# Patient Record
Sex: Female | Born: 1965
Health system: Southern US, Community
[De-identification: ages and names within clinical notes are randomized; demographics above are authoritative.]

## PROBLEM LIST (undated history)

## (undated) ENCOUNTER — Ambulatory Visit: Admission: EM | Payer: PRIVATE HEALTH INSURANCE

## (undated) DIAGNOSIS — J45909 Unspecified asthma, uncomplicated: Secondary | ICD-10-CM

## (undated) DIAGNOSIS — I428 Other cardiomyopathies: Secondary | ICD-10-CM

## (undated) DIAGNOSIS — I5020 Unspecified systolic (congestive) heart failure: Secondary | ICD-10-CM

## (undated) DIAGNOSIS — H35039 Hypertensive retinopathy, unspecified eye: Secondary | ICD-10-CM

## (undated) DIAGNOSIS — I10 Essential (primary) hypertension: Secondary | ICD-10-CM

## (undated) DIAGNOSIS — E119 Type 2 diabetes mellitus without complications: Secondary | ICD-10-CM

## (undated) DIAGNOSIS — I639 Cerebral infarction, unspecified: Secondary | ICD-10-CM

## (undated) DIAGNOSIS — M79605 Pain in left leg: Secondary | ICD-10-CM

## (undated) DIAGNOSIS — H269 Unspecified cataract: Secondary | ICD-10-CM

## (undated) HISTORY — DX: Hypertensive retinopathy, unspecified eye: H35.039

## (undated) HISTORY — PX: CERVIX LESION DESTRUCTION: SHX591

## (undated) HISTORY — PX: DILATION AND CURETTAGE OF UTERUS: SHX78

## (undated) HISTORY — DX: Unspecified cataract: H26.9

---

## 1994-02-12 HISTORY — PX: TUBAL LIGATION: SHX77

## 2000-03-15 ENCOUNTER — Encounter: Admission: RE | Admit: 2000-03-15 | Discharge: 2000-03-15 | Payer: Self-pay

## 2000-08-22 ENCOUNTER — Other Ambulatory Visit: Admission: RE | Admit: 2000-08-22 | Discharge: 2000-08-22 | Payer: Self-pay | Admitting: Obstetrics and Gynecology

## 2000-11-19 ENCOUNTER — Ambulatory Visit (HOSPITAL_COMMUNITY): Admission: RE | Admit: 2000-11-19 | Discharge: 2000-11-19 | Payer: Self-pay | Admitting: Obstetrics and Gynecology

## 2000-11-19 ENCOUNTER — Encounter (INDEPENDENT_AMBULATORY_CARE_PROVIDER_SITE_OTHER): Payer: Self-pay | Admitting: Specialist

## 2001-03-27 ENCOUNTER — Emergency Department (HOSPITAL_COMMUNITY): Admission: EM | Admit: 2001-03-27 | Discharge: 2001-03-27 | Payer: Self-pay | Admitting: Emergency Medicine

## 2006-05-31 ENCOUNTER — Inpatient Hospital Stay (HOSPITAL_COMMUNITY): Admission: AD | Admit: 2006-05-31 | Discharge: 2006-05-31 | Payer: Self-pay | Admitting: Family Medicine

## 2007-07-02 ENCOUNTER — Inpatient Hospital Stay (HOSPITAL_COMMUNITY): Admission: AD | Admit: 2007-07-02 | Discharge: 2007-07-02 | Payer: Self-pay | Admitting: Obstetrics & Gynecology

## 2008-12-03 ENCOUNTER — Encounter: Admission: RE | Admit: 2008-12-03 | Discharge: 2008-12-03 | Payer: Self-pay | Admitting: Family Medicine

## 2009-03-07 ENCOUNTER — Ambulatory Visit (HOSPITAL_COMMUNITY): Admission: RE | Admit: 2009-03-07 | Discharge: 2009-03-07 | Payer: Self-pay | Admitting: Orthopedic Surgery

## 2009-04-27 ENCOUNTER — Encounter: Admission: RE | Admit: 2009-04-27 | Discharge: 2009-04-27 | Payer: Self-pay | Admitting: Family Medicine

## 2009-04-29 ENCOUNTER — Encounter: Admission: RE | Admit: 2009-04-29 | Discharge: 2009-04-29 | Payer: Self-pay | Admitting: Family Medicine

## 2009-10-25 ENCOUNTER — Ambulatory Visit (HOSPITAL_COMMUNITY): Admission: RE | Admit: 2009-10-25 | Discharge: 2009-10-25 | Payer: Self-pay | Admitting: Obstetrics and Gynecology

## 2010-03-05 ENCOUNTER — Encounter: Payer: Self-pay | Admitting: Family Medicine

## 2010-03-06 ENCOUNTER — Encounter: Payer: Self-pay | Admitting: Family Medicine

## 2010-04-27 LAB — BASIC METABOLIC PANEL
BUN: 11 mg/dL (ref 6–23)
Calcium: 8.9 mg/dL (ref 8.4–10.5)
GFR calc non Af Amer: >60 mL/min (ref >60)
Glucose, Bld: 230 mg/dL — ABNORMAL HIGH (ref 70–99)

## 2010-04-27 LAB — CBC
HCT: 36.8 % (ref 36.0–46.0)
MCH: 28.3 pg (ref 26.0–34.0)
MCHC: 33.5 g/dL (ref 30.0–36.0)
MCV: 84.4 fL (ref 78.0–100.0)
RDW: 15.1 % (ref 11.5–15.5)

## 2010-06-30 NOTE — Op Note (Signed)
[facility] [place]  Patient:    [name], [name] Visit Number: [id number] MRN: [id number]          Service Type: DSU Location: [facility] Attending Physician:  Maxie Better Proc. Date: 11/19/00 Admit Date:  11/19/2000                             Operative Report  PREOPERATIVE DIAGNOSIS:       Menorrhagia and uterine fibroids.  POSTOPERATIVE DIAGNOSIS:      Menorrhagia and uterine fibroids and endometrial polyps, pending final pathology.  OPERATION:                    Diagnostic hysteroscopy, resection of possible endometrial polyps, and dilatation and curettage.  SURGEON:                      Sheronette A. Cherly Hensen, M.D.  ASSISTANT:  ANESTHESIA:                   General anesthesia.  ESTIMATED BLOOD LOSS:  DESCRIPTION OF PROCEDURE:     Under adequate general anesthesia the patient was placed in the dorsal lithotomy position.  She was prepped and draped in the usual sterile fashion.  The bladder was catheterized for a small amount of urine.  Examination under anesthesia revealed an anteverted uterus, top normal size, no adnexal masses could be appreciated.  Bivalve speculum was placed in the vagina.  Single tooth tenaculum was placed on the anterior lip of the cervix.  The cervix was parous.  The cervix was serially dilated up to #31 [facility] dilator and a resectoscope was introduced into the uterine cavity without incident.  Both tubal ostia could be seen.  The endocervical canal was without any lesions.  The endometrial cavity was notable for in the cornual region on the right and left was a polypoid thickening, questionable endometrial polyps.  These were resected.  The cavity was then curetted. Reinsertion of the resectoscope with reinspection of the endometrial cavity was then performed.  No evidence of a submucosal fibroid could be seen.  With the curettage, there was slight irregularity of the cavity, but could not be visualized to concur with  a possible submucosal fibroid.  The cavity was once again curetted when the circumferential area of the endometrial cavity was on the same level, the decision was then made to terminate the procedure.  All instruments were then removed from the vagina.  Specimen labled endometrial curetting with possible endometrial polyp was sent to pathology.  Estimated blood loss was minimal.  Complications were none.  The patient tolerated the procedure well and was transferred to the recovery room in stable condition. Attending Physician:  Maxie Better DD:  11/19/00 TD:  11/20/00 Job: [id number]

## 2010-07-06 ENCOUNTER — Other Ambulatory Visit: Payer: Self-pay | Admitting: Family Medicine

## 2010-07-06 ENCOUNTER — Ambulatory Visit
Admission: RE | Admit: 2010-07-06 | Discharge: 2010-07-06 | Disposition: A | Discharge status: Home / Self Care | Payer: Medicaid Other | Source: Ambulatory Visit | Attending: Family Medicine | Admitting: Family Medicine

## 2010-07-06 DIAGNOSIS — R52 Pain, unspecified: Secondary | ICD-10-CM

## 2010-08-17 ENCOUNTER — Other Ambulatory Visit: Payer: Self-pay | Admitting: Podiatry

## 2010-08-17 DIAGNOSIS — M79672 Pain in left foot: Secondary | ICD-10-CM

## 2010-08-19 ENCOUNTER — Ambulatory Visit
Admission: RE | Admit: 2010-08-19 | Discharge: 2010-08-19 | Disposition: A | Discharge status: Home / Self Care | Payer: Medicaid Other | Source: Ambulatory Visit | Attending: Podiatry | Admitting: Podiatry

## 2010-08-19 DIAGNOSIS — M79672 Pain in left foot: Secondary | ICD-10-CM

## 2010-10-05 ENCOUNTER — Ambulatory Visit: Payer: Medicaid Other | Attending: Podiatry | Admitting: Rehabilitation

## 2010-10-05 DIAGNOSIS — IMO0001 Reserved for inherently not codable concepts without codable children: Secondary | ICD-10-CM | POA: Insufficient documentation

## 2010-10-05 DIAGNOSIS — R269 Unspecified abnormalities of gait and mobility: Secondary | ICD-10-CM | POA: Insufficient documentation

## 2010-10-05 DIAGNOSIS — M25673 Stiffness of unspecified ankle, not elsewhere classified: Secondary | ICD-10-CM | POA: Insufficient documentation

## 2010-10-05 DIAGNOSIS — M25676 Stiffness of unspecified foot, not elsewhere classified: Secondary | ICD-10-CM | POA: Insufficient documentation

## 2010-10-05 DIAGNOSIS — M25579 Pain in unspecified ankle and joints of unspecified foot: Secondary | ICD-10-CM | POA: Insufficient documentation

## 2010-10-12 ENCOUNTER — Ambulatory Visit: Payer: Medicaid Other | Admitting: Physical Therapy

## 2010-10-19 ENCOUNTER — Ambulatory Visit: Payer: Medicaid Other | Attending: Podiatry | Admitting: Physical Therapy

## 2010-10-19 DIAGNOSIS — R269 Unspecified abnormalities of gait and mobility: Secondary | ICD-10-CM | POA: Insufficient documentation

## 2010-10-19 DIAGNOSIS — M25676 Stiffness of unspecified foot, not elsewhere classified: Secondary | ICD-10-CM | POA: Insufficient documentation

## 2010-10-19 DIAGNOSIS — M25673 Stiffness of unspecified ankle, not elsewhere classified: Secondary | ICD-10-CM | POA: Insufficient documentation

## 2010-10-19 DIAGNOSIS — M25579 Pain in unspecified ankle and joints of unspecified foot: Secondary | ICD-10-CM | POA: Insufficient documentation

## 2010-10-19 DIAGNOSIS — IMO0001 Reserved for inherently not codable concepts without codable children: Secondary | ICD-10-CM | POA: Insufficient documentation

## 2010-10-23 ENCOUNTER — Ambulatory Visit: Payer: Medicaid Other | Admitting: Physical Therapy

## 2010-11-08 LAB — GC/CHLAMYDIA PROBE AMP, GENITAL: Chlamydia, DNA Probe: NEGATIVE

## 2010-11-08 LAB — POCT PREGNANCY, URINE
Operator id: 280921
Preg Test, Ur: NEGATIVE

## 2010-11-08 LAB — CBC
Hemoglobin: 13
Platelets: 450 — ABNORMAL HIGH
RDW: 15.9 — ABNORMAL HIGH

## 2010-11-08 LAB — URINALYSIS, ROUTINE W REFLEX MICROSCOPIC
Bilirubin Urine: NEGATIVE
Protein, ur: NEGATIVE
Specific Gravity, Urine: >1.03 — ABNORMAL HIGH
Urobilinogen, UA: 0.2

## 2010-11-08 LAB — WET PREP, GENITAL
Clue Cells Wet Prep HPF POC: NONE SEEN
Trich, Wet Prep: NONE SEEN
Yeast Wet Prep HPF POC: NONE SEEN

## 2010-11-08 LAB — URINE MICROSCOPIC-ADD ON

## 2011-05-18 ENCOUNTER — Emergency Department (HOSPITAL_COMMUNITY): Payer: Medicaid Other

## 2011-05-18 ENCOUNTER — Other Ambulatory Visit: Payer: Self-pay

## 2011-05-18 ENCOUNTER — Inpatient Hospital Stay (HOSPITAL_COMMUNITY)
Admission: EM | Admit: 2011-05-18 | Discharge: 2011-05-22 | DRG: 287 | Disposition: A | Discharge status: Home / Self Care | Payer: Medicaid Other | Attending: Internal Medicine | Admitting: Internal Medicine

## 2011-05-18 ENCOUNTER — Encounter (HOSPITAL_COMMUNITY): Payer: Self-pay | Admitting: Family Medicine

## 2011-05-18 DIAGNOSIS — Z91199 Patient's noncompliance with other medical treatment and regimen due to unspecified reason: Secondary | ICD-10-CM

## 2011-05-18 DIAGNOSIS — I10 Essential (primary) hypertension: Secondary | ICD-10-CM | POA: Diagnosis present

## 2011-05-18 DIAGNOSIS — I428 Other cardiomyopathies: Secondary | ICD-10-CM | POA: Diagnosis present

## 2011-05-18 DIAGNOSIS — Z9119 Patient's noncompliance with other medical treatment and regimen: Secondary | ICD-10-CM

## 2011-05-18 DIAGNOSIS — I509 Heart failure, unspecified: Secondary | ICD-10-CM | POA: Diagnosis present

## 2011-05-18 DIAGNOSIS — R079 Chest pain, unspecified: Secondary | ICD-10-CM

## 2011-05-18 DIAGNOSIS — R0902 Hypoxemia: Secondary | ICD-10-CM

## 2011-05-18 DIAGNOSIS — J811 Chronic pulmonary edema: Secondary | ICD-10-CM

## 2011-05-18 DIAGNOSIS — R000 Tachycardia, unspecified: Secondary | ICD-10-CM

## 2011-05-18 DIAGNOSIS — E119 Type 2 diabetes mellitus without complications: Secondary | ICD-10-CM | POA: Diagnosis present

## 2011-05-18 DIAGNOSIS — R0602 Shortness of breath: Secondary | ICD-10-CM

## 2011-05-18 DIAGNOSIS — I5021 Acute systolic (congestive) heart failure: Principal | ICD-10-CM | POA: Diagnosis present

## 2011-05-18 HISTORY — DX: Other cardiomyopathies: I42.8

## 2011-05-18 HISTORY — DX: Type 2 diabetes mellitus without complications: E11.9

## 2011-05-18 HISTORY — DX: Essential (primary) hypertension: I10

## 2011-05-18 HISTORY — DX: Unspecified systolic (congestive) heart failure: I50.20

## 2011-05-18 LAB — DIFFERENTIAL
Basophils Absolute: 0 10*3/uL (ref 0.0–0.1)
Basophils Relative: 1 % (ref 0–1)
Eosinophils Absolute: 0.2 10*3/uL (ref 0.0–0.7)
Eosinophils Relative: 2 % (ref 0–5)
Monocytes Absolute: 0.2 10*3/uL (ref 0.1–1.0)

## 2011-05-18 LAB — CBC
HCT: 40.6 % (ref 36.0–46.0)
MCH: 28.1 pg (ref 26.0–34.0)
MCHC: 32 g/dL (ref 30.0–36.0)
MCV: 87.7 fL (ref 78.0–100.0)
RDW: 14.1 % (ref 11.5–15.5)

## 2011-05-18 LAB — BASIC METABOLIC PANEL
BUN: 14 mg/dL (ref 6–23)
Calcium: 9.2 mg/dL (ref 8.4–10.5)
Creatinine, Ser: 0.77 mg/dL (ref 0.50–1.10)
GFR calc Af Amer: >90 mL/min (ref >90)

## 2011-05-18 LAB — D-DIMER, QUANTITATIVE: D-Dimer, Quant: 1.98 ug/mL-FEU — ABNORMAL HIGH (ref 0.00–0.48)

## 2011-05-18 MED ORDER — MORPHINE SULFATE 4 MG/ML IJ SOLN
4.0000 mg | Freq: Once | INTRAMUSCULAR | Status: AC
  Administered 2011-05-18: 4 mg via INTRAVENOUS
  Filled 2011-05-18: qty 1

## 2011-05-18 MED ORDER — IOHEXOL 300 MG/ML  SOLN
100.0000 mL | Freq: Once | INTRAMUSCULAR | Status: AC | PRN
  Administered 2011-05-18: 80 mL via INTRAVENOUS

## 2011-05-18 MED ORDER — SODIUM CHLORIDE 0.9 % IV SOLN: 250.0000 mL | INTRAVENOUS | Status: DC | PRN

## 2011-05-18 MED ORDER — ALBUTEROL SULFATE (5 MG/ML) 0.5% IN NEBU
5.0000 mg | INHALATION_SOLUTION | RESPIRATORY_TRACT | Status: AC
  Administered 2011-05-18: 5 mg via RESPIRATORY_TRACT
  Filled 2011-05-18: qty 1

## 2011-05-18 MED ORDER — ONDANSETRON HCL 4 MG/2ML IJ SOLN: 4.0000 mg | Freq: Four times a day (QID) | INTRAMUSCULAR | Status: DC | PRN

## 2011-05-18 MED ORDER — HEPARIN SODIUM (PORCINE) 5000 UNIT/ML IJ SOLN
5000.0000 [IU] | Freq: Three times a day (TID) | INTRAMUSCULAR | Status: DC
  Administered 2011-05-19 – 2011-05-21 (×8): 5000 [IU] via SUBCUTANEOUS
  Filled 2011-05-18 (×11): qty 1

## 2011-05-18 MED ORDER — FUROSEMIDE 10 MG/ML IJ SOLN
40.0000 mg | Freq: Two times a day (BID) | INTRAMUSCULAR | Status: DC
  Administered 2011-05-19 – 2011-05-20 (×4): 40 mg via INTRAVENOUS
  Filled 2011-05-18 (×6): qty 4

## 2011-05-18 MED ORDER — ASPIRIN EC 325 MG PO TBEC
325.0000 mg | DELAYED_RELEASE_TABLET | Freq: Every day | ORAL | Status: DC
  Administered 2011-05-19 – 2011-05-22 (×4): 325 mg via ORAL
  Filled 2011-05-18 (×5): qty 1

## 2011-05-18 MED ORDER — ASPIRIN 325 MG PO TABS
325.0000 mg | ORAL_TABLET | Freq: Once | ORAL | Status: AC
  Administered 2011-05-18: 325 mg via ORAL
  Filled 2011-05-18: qty 1

## 2011-05-18 MED ORDER — FUROSEMIDE 10 MG/ML IJ SOLN
20.0000 mg | Freq: Once | INTRAMUSCULAR | Status: AC
  Administered 2011-05-18: 20 mg via INTRAVENOUS
  Filled 2011-05-18 (×2): qty 2

## 2011-05-18 MED ORDER — SODIUM CHLORIDE 0.9 % IJ SOLN
3.0000 mL | INTRAMUSCULAR | Status: DC | PRN
  Administered 2011-05-20: 3 mL via INTRAVENOUS

## 2011-05-18 MED ORDER — SODIUM CHLORIDE 0.9 % IJ SOLN
3.0000 mL | Freq: Two times a day (BID) | INTRAMUSCULAR | Status: DC
  Administered 2011-05-19 – 2011-05-22 (×5): 3 mL via INTRAVENOUS

## 2011-05-18 MED ORDER — LISINOPRIL 20 MG PO TABS
20.0000 mg | ORAL_TABLET | Freq: Every day | ORAL | Status: DC
  Administered 2011-05-19 – 2011-05-22 (×5): 20 mg via ORAL
  Filled 2011-05-18 (×7): qty 1

## 2011-05-18 MED ORDER — NITROGLYCERIN 2 % TD OINT
1.0000 [in_us] | TOPICAL_OINTMENT | Freq: Four times a day (QID) | TRANSDERMAL | Status: DC
  Administered 2011-05-18 – 2011-05-19 (×5): 1 [in_us] via TOPICAL
  Filled 2011-05-18 (×2): qty 30

## 2011-05-18 MED ORDER — ACETAMINOPHEN 325 MG PO TABS
650.0000 mg | ORAL_TABLET | ORAL | Status: DC | PRN
  Administered 2011-05-19 (×2): 650 mg via ORAL
  Filled 2011-05-18 (×2): qty 2

## 2011-05-18 NOTE — ED Notes (Signed)
Pt reports severe sob x1 hour and wheezing in chest with coughing.

## 2011-05-18 NOTE — ED Notes (Signed)
MD at bedside. 

## 2011-05-18 NOTE — ED Notes (Signed)
Pt began having sob and chest pain, after having no complaints for an extended period of time.  MD called to bedside.  Multiple attempts to get 20 ga access made.  Respiratory called and treatment given.  Pt's symptoms have decreased, but not to prior level.

## 2011-05-18 NOTE — ED Notes (Signed)
Assessed by 2 additional RNs, IV team notified.

## 2011-05-18 NOTE — ED Notes (Signed)
Pt was sob labored resp pa emily at bedside waiting to go to ct, was receiving neb.placed 2nd iv called ct to transport pt

## 2011-05-18 NOTE — ED Provider Notes (Signed)
History     CSN: [id number]  Arrival date & time 05/18/11  1358   First MD Initiated Contact with Patient 05/18/11 1444      Chief Complaint  Patient presents with  . Shortness of Breath    (Consider location/radiation/quality/duration/timing/severity/associated sxs/prior treatment) HPI Comments: Pt reports acute onset central chest pressure, shortness of breath, and wheezing approximately 1 hour ago.  Pt does not have a hx of lung problems or asthma.  Has never had any symptoms like this before.  Denies exposure to inhaled chemicals, denies recent immobilization, exogenous estrogen, does not smoke.  No family or personal hx blood clots.  No recent illness.   The history is provided by the patient. The history is limited by the condition of the patient.    Past Medical History  Diagnosis Date  . Diabetes mellitus   . Hypertension     History reviewed. No pertinent past surgical history.  History reviewed. No pertinent family history.  History  Substance Use Topics  . Smoking status: Not on file  . Smokeless tobacco: Not on file  . Alcohol Use:     OB History    Grav Para Term Preterm Abortions TAB SAB Ect Mult Living                  Review of Systems  Constitutional: Negative for fever.  Respiratory: Positive for cough, shortness of breath and wheezing.   Cardiovascular: Positive for chest pain.  Gastrointestinal: Negative for vomiting, abdominal pain and diarrhea.  Genitourinary: Negative for dysuria.  All other systems reviewed and are negative.    Allergies  Review of patient's allergies indicates no known allergies.  Home Medications   Current Outpatient Rx  Name Route Sig Dispense Refill  . IBUPROFEN 200 MG PO TABS Oral Take 400 mg by mouth every 6 (six) hours as needed. pain    . OLMESARTAN-AMLODIPINE-HCTZ 40-10-25 MG PO TABS Oral Take 1 tablet by mouth daily.      BP 155/102  Pulse 130  Temp(Src) 99 F (37.2 C) (Oral)  Resp 24  SpO2 95%   LMP 05/16/2011  Physical Exam  Nursing note and vitals reviewed. Constitutional: She is oriented to person, place, and time. She appears well-developed and well-nourished.  HENT:  Head: Normocephalic and atraumatic.  Neck: Neck supple.  Cardiovascular: Normal rate, regular rhythm and normal heart sounds.   Pulmonary/Chest: Accessory muscle usage present. Tachypnea noted. No respiratory distress. She has wheezes. She has rales. She exhibits no tenderness.  Abdominal: Soft. Bowel sounds are normal. She exhibits no distension. There is no tenderness. There is no rebound and no guarding.  Musculoskeletal: Normal range of motion.  Neurological: She is alert and oriented to person, place, and time. No cranial nerve deficit. She exhibits normal muscle tone. Coordination normal.  Skin: She is not diaphoretic.  Psychiatric: She has a normal mood and affect. Her behavior is normal. Judgment and thought content normal.    ED Course  Procedures (including critical care time)  Labs Reviewed  CBC - Abnormal; Notable for the following:    Platelets 417 (*)    All other components within normal limits  BASIC METABOLIC PANEL - Abnormal; Notable for the following:    Glucose, Bld 145 (*)    All other components within normal limits  D-DIMER, QUANTITATIVE - Abnormal; Notable for the following:    D-Dimer, Quant 1.98 (*)    All other components within normal limits  DIFFERENTIAL   Dg Chest 2  View  05/18/2011  *RADIOLOGY REPORT*  Clinical Data: Shortness of breath, wheezing  CHEST - 2 VIEW  Comparison: None.  Findings: Cardiomediastinal silhouette is unremarkable.  There is mild interstitial prominence bilaterally without focal infiltrate. Early edema or pneumonitis cannot be excluded.  Clinical correlation is necessary.  IMPRESSION: Mild interstitial prominence bilaterally.  Early edema or pneumonitis cannot be excluded.  No focal infiltrate.  Original Report Authenticated By: Natasha Mead, M.D.   Ct  Angio Chest W/cm &/or Wo Cm  05/18/2011  *RADIOLOGY REPORT*  Clinical Data: Shortness of breath  CT ANGIOGRAPHY CHEST  Technique:  Multidetector CT imaging of the chest using the standard protocol during bolus administration of intravenous contrast. Multiplanar reconstructed images including MIPs were obtained and reviewed to evaluate the vascular anatomy.  Contrast: 80mL OMNIPAQUE IOHEXOL 300 MG/ML  SOLN  Comparison: None.  Findings: No central pulmonary embolus is noted. Limited assessment of the segmental and subsegmental arterial branches due to poor bolus contrast opacification.  There is no mediastinal hematoma or adenopathy.  Heart size is within normal limits.  No pericardial effusion.  No hilar adenopathy.  Bilateral upper lobe patchy central airway disease noted. There is a hazy airspace disease bilateral lower lobe with patchy ground-glass attenuation.  Findings are highly suspicious for bilateral pneumonia.  Clinical correlation is necessary to exclude bilateral alveolar proteinosis, aspiration or hemorrhage.  Less likely allergic bronchopulmonary aspergillosis or alveolar sarcoid. Clinical correlation is necessary.  No pulmonary edema.  No diagnostic pneumothorax.  No destructive bony lesions are noted.  The visualized upper abdomen is unremarkable.  Small hiatal hernia.  IMPRESSION:  1.  No central pulmonary embolus is noted.  Limited assessment of the segmental and subsegmental arterial branches due to poor bolus opacification. 2.  Bilateral upper lobe patchy central airway disease noted. There is a hazy airspace disease bilateral lower lobe with patchy ground-glass attenuation.  Findings are highly suspicious for bilateral pneumonia.  Clinical correlation is necessary to exclude bilateral alveolar proteinosis, aspiration or hemorrhage.  Less likely allergic bronchopulmonary aspergillosis or alveolar sarcoid. 3.  No mediastinal hematoma or adenopathy.  Original Report Authenticated By: Natasha Mead, M.D.     2:49 PM Patient not yet in exam room.   3:03 PM Pt seen and examined, labs, xray, neb treatment ordered.   5:44 PM Patient reports breathing is improving, believes neb treatment helped.  She is 88% (O2 sat) on room air.  I have asked the tech to please place patient on [place] as previously ordered and obtain new set of vital signs.   5:59 PM Nurse is now placing 20 gauge, pt is next in line for CT.  Dr Ranae Palms is aware of the patient.     Date: 05/18/2011  Rate: 122   Rhythm: sinus tachycardia  QRS Axis: normal  Intervals: normal  ST/T Wave abnormalities: nonspecific T wave changes  Conduction Disutrbances:none  Narrative Interpretation:   Old EKG Reviewed: none available  6:11 PM Charge nurse Shawna Orleans is currently attempting 20 gauge IV for CT scan.  I have asked tech again for a full set of vitals.  I have again asked CT to take patient next as soon as IV is placed.  Pt has started to have hemoptysis.   6:42 PM IV placed not able to be used.  Vincenza Hews, RN, attempting new IV.  Discussed developments with Dr Ranae Palms who will see the patient.    6:53 PM Dr Ranae Palms has seen the patient.  Plan to continue as before.  IV  team has been called as second RN unable to place IV.    7:11 PM IV team unable to place 20 gauge.  I spoke with radiologist who preferred using 22 rather than possible EJ, recommended slower rate of flow, which I discussed with CT techs.    7:44 PM CT is resulted.  Discussed and reviewed with Dr Ranae Palms.  Dr Ranae Palms now speaking with radiologist.    8:16 PM I have spoken with Dr Houston Siren regarding admission who prefers we speak first with cardiology. Dr Conley Rolls and Dr Ranae Palms have discussed case.  Dr Ranae Palms to consult cardiology.    1. Pulmonary edema   2. Hypertension   3. Tachycardia   4. Hypoxia       MDM  Patient with acute onset SOB, wheezing, chest pain one hour prior to arrival, developed pink frothy sputum while in ED.  Pt was hypertensive,  tachycardic, hypoxic to 87% on room air.  Found to have pulmonary edema on CT (read as possible pneumonia, but this does not fit clinically as patient has normal WBC, is afebrile, has not been sick or had any symptoms up until just prior to arrival).  Discussed case with [facility] hospitalist, who preferred cardiology admission.  Dr Ranae Palms spoke with cardiology.  Dr Ranae Palms to assume care of patient to set disposition.          Dillard Cannon Boring, [place] 05/18/11 2031

## 2011-05-19 LAB — CBC
HCT: 37.3 % (ref 36.0–46.0)
MCH: 28.1 pg (ref 26.0–34.0)
MCHC: 32.6 g/dL (ref 30.0–36.0)
MCHC: 32.7 g/dL (ref 30.0–36.0)
Platelets: 405 10*3/uL — ABNORMAL HIGH (ref 150–400)
Platelets: 409 10*3/uL — ABNORMAL HIGH (ref 150–400)
RBC: 4.34 MIL/uL (ref 3.87–5.11)
RDW: 14.4 % (ref 11.5–15.5)

## 2011-05-19 LAB — BASIC METABOLIC PANEL
BUN: 10 mg/dL (ref 6–23)
CO2: 24 mEq/L (ref 19–32)
Calcium: 9 mg/dL (ref 8.4–10.5)
Creatinine, Ser: 0.77 mg/dL (ref 0.50–1.10)
GFR calc non Af Amer: >90 mL/min (ref >90)
Glucose, Bld: 221 mg/dL — ABNORMAL HIGH (ref 70–99)

## 2011-05-19 LAB — DIFFERENTIAL
Basophils Relative: 0 % (ref 0–1)
Eosinophils Absolute: 0.2 10*3/uL (ref 0.0–0.7)
Lymphs Abs: 2.2 10*3/uL (ref 0.7–4.0)
Neutrophils Relative %: 74 % (ref 43–77)

## 2011-05-19 LAB — GLUCOSE, CAPILLARY
Glucose-Capillary: 211 mg/dL — ABNORMAL HIGH (ref 70–99)
Glucose-Capillary: 136 mg/dL — ABNORMAL HIGH (ref 70–99)
Glucose-Capillary: 123 mg/dL — ABNORMAL HIGH (ref 70–99)
Glucose-Capillary: 161 mg/dL — ABNORMAL HIGH (ref 70–99)

## 2011-05-19 LAB — CARDIAC PANEL(CRET KIN+CKTOT+MB+TROPI)
CK, MB: 1.7 ng/mL (ref 0.3–4.0)
CK, MB: 1.8 ng/mL (ref 0.3–4.0)
Relative Index: 0.7 (ref 0.0–2.5)
Total CK: 247 U/L — ABNORMAL HIGH (ref 7–177)
Troponin I: <0.3 ng/mL (ref <0.30)
Troponin I: <0.3 ng/mL (ref <0.30)

## 2011-05-19 LAB — TSH: TSH: 2.242 u[IU]/mL (ref 0.350–4.500)

## 2011-05-19 LAB — HEMOGLOBIN A1C: Mean Plasma Glucose: 171 mg/dL — ABNORMAL HIGH (ref <117)

## 2011-05-19 LAB — CREATININE, SERUM: GFR calc non Af Amer: 86 mL/min — ABNORMAL LOW (ref >90)

## 2011-05-19 LAB — PRO B NATRIURETIC PEPTIDE: Pro B Natriuretic peptide (BNP): 1257 pg/mL — ABNORMAL HIGH (ref 0–125)

## 2011-05-19 MED ORDER — INSULIN ASPART 100 UNIT/ML ~~LOC~~ SOLN
0.0000 [IU] | Freq: Three times a day (TID) | SUBCUTANEOUS | Status: DC
  Administered 2011-05-19: 18:00:00 via SUBCUTANEOUS
  Administered 2011-05-19: 2 [IU] via SUBCUTANEOUS
  Administered 2011-05-19: 5 [IU] via SUBCUTANEOUS
  Administered 2011-05-20 (×2): 3 [IU] via SUBCUTANEOUS
  Administered 2011-05-20 – 2011-05-21 (×2): 2 [IU] via SUBCUTANEOUS
  Administered 2011-05-21: 3 [IU] via SUBCUTANEOUS
  Administered 2011-05-22: 2 [IU] via SUBCUTANEOUS
  Administered 2011-05-22: 3 [IU] via SUBCUTANEOUS

## 2011-05-19 MED ORDER — CARVEDILOL 3.125 MG PO TABS
3.1250 mg | ORAL_TABLET | Freq: Two times a day (BID) | ORAL | Status: DC
  Administered 2011-05-20 (×2): 3.125 mg via ORAL
  Filled 2011-05-19 (×3): qty 1

## 2011-05-19 MED ORDER — INSULIN ASPART 100 UNIT/ML ~~LOC~~ SOLN
0.0000 [IU] | Freq: Every day | SUBCUTANEOUS | Status: DC
  Administered 2011-05-21: 2 [IU] via SUBCUTANEOUS

## 2011-05-19 MED ORDER — NITROGLYCERIN 2 % TD OINT
0.5000 [in_us] | TOPICAL_OINTMENT | Freq: Four times a day (QID) | TRANSDERMAL | Status: DC
  Administered 2011-05-20 (×2): 0.5 [in_us] via TOPICAL
  Filled 2011-05-19: qty 30

## 2011-05-19 MED ORDER — AMLODIPINE BESYLATE 5 MG PO TABS
5.0000 mg | ORAL_TABLET | Freq: Every day | ORAL | Status: DC
  Administered 2011-05-19 – 2011-05-20 (×2): 5 mg via ORAL
  Filled 2011-05-19 (×3): qty 1

## 2011-05-19 NOTE — H&P (Signed)
[name], [name]             ACCOUNT NO.:  [id number]  MEDICAL RECORD NO.:  [id number]  LOCATION:  1414                         FACILITY[facility]  [facility]  PHYSICIAN:  Natasha Bence, MD       DATE OF BIRTH:  September 27, 1965  DATE OF ADMISSION:  05/18/2011 DATE OF DISCHARGE:                             HISTORY & PHYSICAL   CHIEF COMPLAINT:  Shortness of breath, and chest discomfort.  HISTORY OF PRESENT ILLNESS:  HISTORY OF PRESENT ILLNESS:  [name] is a 46 year old black female with history of hypertension, pre diabetes, who presented to the [facility] Emergency department this afternoon with acute shortness of breath, she reports on sitting and laying in bed and developed some chest heaviness and abrupt shortness of breath.  She also began coughing up whitish sputum.  She has not had any episodes like this before.  The chest pain did not radiate, was not associated with any nausea, vomiting, or diaphoresis, just felt like a heaviness and a tight wheezing feeling she said.  She reports she had diffuse wheezing and was gasping for air.  When she arrived in the ER, she had saturations in the low 80%, and was promptly treated with oxygen.  Otherwise, she reports being fairly active.  She has been able to walk approximately 1 mile without shortness of breath.  She denies any history of PND or orthopnea.  She has had some left ankle swelling but otherwise no lower extremity edema.  She denies any recent long trips or operations.  She denies any bleeding or melena.  She did not had any fevers, chills or sweats.  No palpitations, lightheadedness, or dizziness.  She denies any headache or blurry vision.  Otherwise, complete review of systems was performed was negative except for as stated above.  PAST MEDICAL HISTORY: 1. Hypertension. 2. Pre diabetes.  PAST SURGICAL HISTORY:  She had some kind of cervical surgery she said.  FAMILY HISTORY:  Diabetes, no coronary artery disease.  SOCIAL  HISTORY:  She is a lifelong nonsmoker, nondrinker.  She works for [employer].  ALLERGIES:  She has no known drug allergies.  MEDICATIONS:  She is supposed to be on olmesartan/Norvasc/hydrochlorothiazide 40/10/25 mg p.o. daily, and then metformin 500 mg p.o. daily.  She is out of both medications.  PHYSICAL EXAMINATION:  VITAL SIGNS:  She is afebrile with temperature 98.6, pulse of 119 and regular, respiratory rate of 20, O2 sat is currently 97% on room air, blood pressure 152/93.  On presentation, her saturations were in the 80s.  She was much more tachypneic and her blood pressure raised as high as 172/109. GENERAL:  Currently, she is in no apparent distress. EYES:  She has anicteric sclerae. NECK:  She has normal jugular venous pressure.  No carotid bruits. LUNGS:  Fairly clear to auscultation.  She still has bibasilar crackles. CARDIOVASCULAR:  She is tachycardic.  No murmurs, rubs, or gallops. Abdomen was obese, soft, nontender, nondistended.  No bruits were auscultated. EXTREMITIES:  Warm with no edema.  She has symmetrical pulses throughout. SKIN:  No rashes or ulcers. NEURO:  Grossly afocal.  LABORATORY DATA:  Sodium was 137, potassium was 3.9, chloride was 105, bicarb was 23,  BUN was 14, creatinine 0.77, calcium 9.2, glucose of 145. White blood cell count 7.5, hematocrit was 40.6, platelet count was 417. She had a normal differential in her white count.  D-dimer was 1.98. Chest x-ray shows bilateral interstitial infiltrates with slight pulmonary edema.  EKG shows a sinus tachycardia with a rate of 122 beats per minute.  No acute ischemic changes.  Chest CT was officially read as no central pulmonary embolism.  However, there is poor assessment of the segmental arterial branches.  There was bilateral upper lobe disease and hazy airspace disease that was felt to be highly suspicious for bilateral pneumonia.  ER physician called radiologist on call due to high clinical suspicion  of pulmonary edema, and the second radiologist felt the findings were more related to pulmonary edema.  IMPRESSION AND PLAN:  This is a 46 year old obese black female with hypertension and pre diabetes, who presents with acute pulmonary edema. She also had some chest discomfort during this episode.  She does have a few risk factors for coronary artery disease.  Her EKG does not show any acute ischemic changes.  We will check serial cardiac enzymes.  However, feels this is more likely related to uncontrolled hypertension.  She is normally on 3 antihypertensive medications  (combination pill) which she has been out of for some time.  Additionally, she took a couple of doses of Advil this morning which could have caused her to retain fluid in addition to having a fairly high salt meal this afternoon before the episode.  We will treat her blood pressure.  Her blood pressure was high in the ER as high as  172/109.  We will rule out acute coronary syndrome, and check an echocardiogram in the morning. Otherwise, we will focus on treating her uncontrolled hypertension.  She had difficulty paying for her medications.  At this point, we will switch to generic agents and start her on lisinopril and Norvasc.  She is diuresed well in the ED with IV Lasix.  We will continue this for now and then transition her over to hydrochlorothiazide depending on what lab assays show and echocardiogram shows and we will proceed with further evaluation.  Something else to consider would be renal artery stenosis.          ______________________________ Natasha Bence, MD     MH/MEDQ  D:  05/18/2011  T:  05/19/2011  Job:  [id number]

## 2011-05-19 NOTE — ED Provider Notes (Signed)
Medical screening examination/treatment/procedure(s) were performed by non-physician practitioner and as supervising physician I was immediately available for consultation/collaboration.   Bailley Guilford R Jerrye Seebeck, MD 05/19/11 0816 

## 2011-05-19 NOTE — Progress Notes (Signed)
Pt given "living better with heart failure" booklet per physician order. Pt educated on HF and daily weights using teach back. Pt educated about 2D ECHO. Earnest Conroy. Clelia Croft, RN

## 2011-05-19 NOTE — Progress Notes (Signed)
SUBJECTIVE:  SOB improved but still has cough.  No further CP  OBJECTIVE:   Vitals:   Filed Vitals:   05/18/11 1935 05/18/11 1948 05/18/11 2341 05/19/11 0553  BP:  152/93 153/100 137/94  Pulse: 122 119 115 109  Temp:  98.6 F (37 C) 97.6 F (36.4 C) 97.4 F (36.3 C)  TempSrc:  Oral Oral Oral  Resp:   18 16  Height:   5\' 9"  (1.753 m)   Weight:   105.5 kg (232 lb 9.4 oz) 103.6 kg (228 lb 6.3 oz)  SpO2:  97% 99% 95%   I&O's:   Intake/Output Summary (Last 24 hours) at 05/19/11 0740 Last data filed at 05/19/11 0610  Gross per 24 hour  Intake      0 ml  Output    610 ml  Net   -610 ml   TELEMETRY: Reviewed telemetry pt in NSR:     PHYSICAL EXAM General: Well developed, well nourished, in no acute distress Head: Eyes PERRLA, No xanthomas.   Normal cephalic and atramatic  Lungs:   Clear bilaterally to auscultation and percussion. Heart:   HRRR S1 S2 Pulses are 2+ & equal.            No carotid bruit. No JVD.  No abdominal bruits. No femoral bruits. Abdomen: Bowel sounds are positive, abdomen soft and non-tender without masses Extremities:   No clubbing, cyanosis or edema.  DP +1 Neuro: Alert and oriented X 3. Psych:  Good affect, responds appropriately   LABS: Basic Metabolic Panel:  Basename 05/19/11 0001 05/18/11 1550  NA -- 137  K -- 3.9  CL -- 105  CO2 -- 23  GLUCOSE -- 145*  BUN -- 14  CREATININE 0.82 0.77  CALCIUM -- 9.2  MG -- --  PHOS -- --   Liver Function Tests: No results found for this basename: AST:2,ALT:2,ALKPHOS:2,BILITOT:2,PROT:2,ALBUMIN:2 in the last 72 hours No results found for this basename: LIPASE:2,AMYLASE:2 in the last 72 hours CBC:  Basename 05/19/11 0001 05/18/11 1550  WBC 14.5* 7.5  NEUTROABS -- 4.2  HGB 12.2 13.0  HCT 37.3 40.6  MCV 87.4 87.7  PLT 409* 417*   Cardiac Enzymes:  Basename 05/19/11 0001  CKTOTAL 181*  CKMB 1.7  CKMBINDEX --  TROPONINI <0.30   BNP: No components found with this basename:  POCBNP:3 D-Dimer:  Basename 05/18/11 1550  DDIMER 1.98*   Hemoglobin A1C: No results found for this basename: HGBA1C in the last 72 hours Fasting Lipid Panel: No results found for this basename: CHOL,HDL,LDLCALC,TRIG,CHOLHDL,LDLDIRECT in the last 72 hours Thyroid Function Tests:  Basename 05/19/11 0001  TSH 2.242  T4TOTAL --  T3FREE --  THYROIDAB --   Anemia Panel: No results found for this basename: VITAMINB12,FOLATE,FERRITIN,TIBC,IRON,RETICCTPCT in the last 72 hours Coag Panel:   No results found for this basename: INR, PROTIME    RADIOLOGY: Dg Chest 2 View  05/18/2011  *RADIOLOGY REPORT*  Clinical Data: Shortness of breath, wheezing  CHEST - 2 VIEW  Comparison: None.  Findings: Cardiomediastinal silhouette is unremarkable.  There is mild interstitial prominence bilaterally without focal infiltrate. Early edema or pneumonitis cannot be excluded.  Clinical correlation is necessary.  IMPRESSION: Mild interstitial prominence bilaterally.  Early edema or pneumonitis cannot be excluded.  No focal infiltrate.  Original Report Authenticated By: Natasha Mead, M.D.   Ct Angio Chest W/cm &/or Wo Cm  05/18/2011  *RADIOLOGY REPORT*  Clinical Data: Shortness of breath  CT ANGIOGRAPHY CHEST  Technique:  Multidetector CT imaging  of the chest using the standard protocol during bolus administration of intravenous contrast. Multiplanar reconstructed images including MIPs were obtained and reviewed to evaluate the vascular anatomy.  Contrast: 80mL OMNIPAQUE IOHEXOL 300 MG/ML  SOLN  Comparison: None.  Findings: No central pulmonary embolus is noted. Limited assessment of the segmental and subsegmental arterial branches due to poor bolus contrast opacification.  There is no mediastinal hematoma or adenopathy.  Heart size is within normal limits.  No pericardial effusion.  No hilar adenopathy.  Bilateral upper lobe patchy central airway disease noted. There is a hazy airspace disease bilateral lower lobe with  patchy ground-glass attenuation.  Findings are highly suspicious for bilateral pneumonia.  Clinical correlation is necessary to exclude bilateral alveolar proteinosis, aspiration or hemorrhage.  Less likely allergic bronchopulmonary aspergillosis or alveolar sarcoid. Clinical correlation is necessary.  No pulmonary edema.  No diagnostic pneumothorax.  No destructive bony lesions are noted.  The visualized upper abdomen is unremarkable.  Small hiatal hernia.  IMPRESSION:  1.  No central pulmonary embolus is noted.  Limited assessment of the segmental and subsegmental arterial branches due to poor bolus opacification. 2.  Bilateral upper lobe patchy central airway disease noted. There is a hazy airspace disease bilateral lower lobe with patchy ground-glass attenuation.  Findings are highly suspicious for bilateral pneumonia.  Clinical correlation is necessary to exclude bilateral alveolar proteinosis, aspiration or hemorrhage.  Less likely allergic bronchopulmonary aspergillosis or alveolar sarcoid. 3.  No mediastinal hematoma or adenopathy.  Original Report Authenticated By: Natasha Mead, M.D.      ASSESSMENT:  1.  Acute SOB with elevated DDIMER and chest CT angio with no PE but bilateral upper lobe patchy central airway disease with ground-glass attenuation worrisome for bilateral pneumonia.  WBC elevated.  She has had a cough for a few days 2.  Chest pressure in the setting of SOB with negative enzymes x 1 - CRF include DM/HTN 3.  Hypertensive crisis BP improved - she had been without meds for sometime due to cost 4.  Pre-DM  PLAN:   1.  Check a BNP - chest CT goes against edema and more towards pneumonia.  She also has an elevated WBC which could be a stress reaction or due to pneumonia. 2.  Pulmonary consult 3.  Check 2D echo 4.  Continue to follow cardiac enzymes  Quintella Reichert, MD  05/19/2011  7:40 AM

## 2011-05-19 NOTE — Progress Notes (Signed)
  Echocardiogram 2D Echocardiogram has been performed.  Charelle Petrakis L 05/19/2011, 8:09 AM

## 2011-05-19 NOTE — Progress Notes (Addendum)
Cm spoke with pt concerning CM consult for medication assistance/Heart Failure Home Screen. Per pt choice [facility] to provide HF Screen. [facility] rep Talmadge Coventry notified of referral. Pt states unable to afford brand name HTN medications. Pt given [facility] generic drug list, [email or web address] information. Pt eligible for indigent funds. Sticky note left for MD to discharge pt on generic drugs. Pt would benefit from HEART FAILURE INPT MEDICATION ASSISTANCE PROGRAM. RN notified CM of plan to educate concerning HF & DM. Pt agrees with dc plan. Pt has PCP.   Azaria Bartell,RN,BSN

## 2011-05-20 ENCOUNTER — Encounter (HOSPITAL_COMMUNITY): Payer: Self-pay | Admitting: Pulmonary Disease

## 2011-05-20 DIAGNOSIS — J811 Chronic pulmonary edema: Secondary | ICD-10-CM

## 2011-05-20 DIAGNOSIS — R000 Tachycardia, unspecified: Secondary | ICD-10-CM

## 2011-05-20 DIAGNOSIS — R0902 Hypoxemia: Secondary | ICD-10-CM

## 2011-05-20 LAB — HIV ANTIBODY (ROUTINE TESTING W REFLEX): HIV: NONREACTIVE

## 2011-05-20 LAB — GLUCOSE, CAPILLARY
Glucose-Capillary: 163 mg/dL — ABNORMAL HIGH (ref 70–99)
Glucose-Capillary: 141 mg/dL — ABNORMAL HIGH (ref 70–99)
Glucose-Capillary: 166 mg/dL — ABNORMAL HIGH (ref 70–99)
Glucose-Capillary: 166 mg/dL — ABNORMAL HIGH (ref 70–99)

## 2011-05-20 LAB — BASIC METABOLIC PANEL
Calcium: 8.8 mg/dL (ref 8.4–10.5)
GFR calc non Af Amer: >90 mL/min (ref >90)
Glucose, Bld: 167 mg/dL — ABNORMAL HIGH (ref 70–99)
Potassium: 3.3 mEq/L — ABNORMAL LOW (ref 3.5–5.1)
Sodium: 137 mEq/L (ref 135–145)

## 2011-05-20 MED ORDER — POTASSIUM CHLORIDE CRYS ER 20 MEQ PO TBCR
40.0000 meq | EXTENDED_RELEASE_TABLET | Freq: Once | ORAL | Status: AC
  Administered 2011-05-20: 40 meq via ORAL
  Filled 2011-05-20: qty 2

## 2011-05-20 MED ORDER — CARVEDILOL 6.25 MG PO TABS
6.2500 mg | ORAL_TABLET | Freq: Two times a day (BID) | ORAL | Status: DC
  Administered 2011-05-20 – 2011-05-22 (×3): 6.25 mg via ORAL
  Filled 2011-05-20 (×7): qty 1

## 2011-05-20 MED ORDER — LIVING WELL WITH DIABETES BOOK
Freq: Once | Status: DC
  Filled 2011-05-20: qty 1

## 2011-05-20 MED ORDER — POTASSIUM CHLORIDE CRYS ER 20 MEQ PO TBCR
20.0000 meq | EXTENDED_RELEASE_TABLET | Freq: Every day | ORAL | Status: DC
  Administered 2011-05-21 – 2011-05-22 (×2): 20 meq via ORAL
  Filled 2011-05-20 (×3): qty 1

## 2011-05-20 NOTE — Progress Notes (Signed)
Pt BP is 156/100. Notified Dr. Marcelo Baldy P/a R. Barrett. Ordered Coreg 3.125mg  BID. I also mentioned to her that pt is getting frequent headaches that are relieved with Tylenol. Change NTG paste from 1" to 1/2" and continue to give tylenol if h/a is relieved; if not d/c NTG.

## 2011-05-20 NOTE — Progress Notes (Signed)
SUBJECTIVE:  Feels better today, no cough  OBJECTIVE:   Vitals:   Filed Vitals:   05/20/11 0025 05/20/11 0547 05/20/11 0839 05/20/11 0843  BP: 143/90 133/88 137/91 137/91  Pulse: 96 96    Temp:  98 F (36.7 C)    TempSrc:  Oral    Resp:  18    Height:      Weight:  100.3 kg (221 lb 1.9 oz)    SpO2:  97%     I&O's:   Intake/Output Summary (Last 24 hours) at 05/20/11 0916 Last data filed at 05/20/11 0548  Gross per 24 hour  Intake    480 ml  Output   3450 ml  Net  -2970 ml   TELEMETRY: Reviewed telemetry pt in NSR:     PHYSICAL EXAM General: Well developed, well nourished, in no acute distress Head: Eyes PERRLA, No xanthomas.   Normal cephalic and atramatic  Lungs:   Clear bilaterally to auscultation and percussion. Heart:   HRRR S1 S2 Pulses are 2+ & equal. Abdomen: Bowel sounds are positive, abdomen soft and non-tender without masses Extremities:   No clubbing, cyanosis or edema.  DP +1 Neuro: Alert and oriented X 3. Psych:  Good affect, responds appropriately   LABS: Basic Metabolic Panel:  Basename 05/20/11 0500 05/19/11 0830  NA 137 134*  K 3.3* 3.6  CL 101 101  CO2 27 24  GLUCOSE 167* 221*  BUN 11 10  CREATININE 0.78 0.77  CALCIUM 8.8 9.0  MG -- --  PHOS -- --   CBC:  Basename 05/19/11 0830 05/19/11 0001 05/18/11 1550  WBC 10.1 14.5* --  NEUTROABS 7.5 -- 4.2  HGB 12.2 12.2 --  HCT 37.4 37.3 --  MCV 86.2 87.4 --  PLT 405* 409* --   Cardiac Enzymes:  Basename 05/19/11 1529 05/19/11 0830 05/19/11 0001  CKTOTAL 207* 247* 181*  CKMB 1.3 1.8 1.7  CKMBINDEX -- -- --  TROPONINI <0.30 <0.30 <0.30   D-Dimer:  Basename 05/18/11 1550  DDIMER 1.98*   Hemoglobin A1C:  Basename 05/19/11 0001  HGBA1C 7.6*   Thyroid Function Tests:  Basename 05/19/11 0001  TSH 2.242  T4TOTAL --  T3FREE --  THYROIDAB --     RADIOLOGY: Dg Chest 2 View  05/18/2011  *RADIOLOGY REPORT*  Clinical Data: Shortness of breath, wheezing  CHEST - 2 VIEW  Comparison:  None.  Findings: Cardiomediastinal silhouette is unremarkable.  There is mild interstitial prominence bilaterally without focal infiltrate. Early edema or pneumonitis cannot be excluded.  Clinical correlation is necessary.  IMPRESSION: Mild interstitial prominence bilaterally.  Early edema or pneumonitis cannot be excluded.  No focal infiltrate.  Original Report Authenticated By: Natasha Mead, M.D.   Ct Angio Chest W/cm &/or Wo Cm  05/18/2011  *RADIOLOGY REPORT*  Clinical Data: Shortness of breath  CT ANGIOGRAPHY CHEST  Technique:  Multidetector CT imaging of the chest using the standard protocol during bolus administration of intravenous contrast. Multiplanar reconstructed images including MIPs were obtained and reviewed to evaluate the vascular anatomy.  Contrast: 80mL OMNIPAQUE IOHEXOL 300 MG/ML  SOLN  Comparison: None.  Findings: No central pulmonary embolus is noted. Limited assessment of the segmental and subsegmental arterial branches due to poor bolus contrast opacification.  There is no mediastinal hematoma or adenopathy.  Heart size is within normal limits.  No pericardial effusion.  No hilar adenopathy.  Bilateral upper lobe patchy central airway disease noted. There is a hazy airspace disease bilateral lower lobe with patchy ground-glass  attenuation.  Findings are highly suspicious for bilateral pneumonia.  Clinical correlation is necessary to exclude bilateral alveolar proteinosis, aspiration or hemorrhage.  Less likely allergic bronchopulmonary aspergillosis or alveolar sarcoid. Clinical correlation is necessary.  No pulmonary edema.  No diagnostic pneumothorax.  No destructive bony lesions are noted.  The visualized upper abdomen is unremarkable.  Small hiatal hernia.  IMPRESSION:  1.  No central pulmonary embolus is noted.  Limited assessment of the segmental and subsegmental arterial branches due to poor bolus opacification. 2.  Bilateral upper lobe patchy central airway disease noted. There is a  hazy airspace disease bilateral lower lobe with patchy ground-glass attenuation.  Findings are highly suspicious for bilateral pneumonia.  Clinical correlation is necessary to exclude bilateral alveolar proteinosis, aspiration or hemorrhage.  Less likely allergic bronchopulmonary aspergillosis or alveolar sarcoid. 3.  No mediastinal hematoma or adenopathy.  Original Report Authenticated By: Natasha Mead, M.D.   ASSESSMENT:  1. Acute SOB with elevated DDIMER and chest CT angio with no PE but bilateral upper lobe patchy central airway disease with ground-glass attenuation worrisome for bilateral pneumonia. WBC elevated. She has had a cough for a few days  2. Chest pressure in the setting of SOB with elevated CPK but normal troponin and MB x 3 - CRF include DM/HTN  3. Hypertensive crisis BP improved - she had been without meds for sometime due to cost  4. Pre-DM  5.  Acute CHF with elevated BNP - discussed with Dr. Ignacia Marvel the findings of the chest CT and he felt this probably represents CHF but will have Dr. Kriste Basque review CT scan. 6.  Hypokalemia   PLAN:   1.  Continue IV diuretics - good urine output yesterday 2.  Increase Carvedilol to 6.25mg  BID for better BP control and continue ACE I and amlodipine 3.  Replete potassium 4.  BMET in am 5.  Await results of echo 6.  Hospitalist consult for diabetic management - elevated HbA1C 7.  D/C NTpaste 8. Continue ASA  Quintella Reichert, MD  05/20/2011  9:16 AM

## 2011-05-20 NOTE — Consult Note (Signed)
Inpatient Progress Note Patient ID: [name] MRN: [id number] DOB/AGE: 1965/07/16 46 y.o.  Primary:  Dr. Alwyn Pea [facility])  Subjective: 46 y/o BF in good general health w/ hx HBP, and mild DM; on triple combination BP med (Tribenzor) & Metformin but ran out of both some time ago; presented w/ sudden SOB while in bed 05/18/11 assoc w/ chest heaviness/ discomfort, cough w/ pinkish sput, wheezing & gasping for air; denies discolored phlegm, f/c/s, n/v/choking; she is a never smoker w/o hx lung disease, note several risk factors but no clinical hx heart dis etc; no recent travel, trauma, leg swelling or blood clots in her past or family hx; denies hi risk behaviors as well... No prev CXR etc in EPIC system...   Past Medical History  Diagnosis Date  . Diabetes mellitus   . Hypertension    History reviewed. No pertinent past surgical history.  History reviewed. No pertinent family history. Father alive, age 27, in good general health... Mother alive, age 44, w/ Hx HBP, DM, & cardiac arrhythmia.    Her parents both have hx heart disease... 2Siblings> 1Bro w/ HBP; 1Sis w/ DM...  History   Social History  . Marital Status: Divorced    Spouse Name: N/A    Number of Children: N/A  . Years of Education: N/A   Occupational History  . Works for the former [employer]- 12H shifts, physical work, dusty, Catering manager...   Social History Main Topics  . Smoking status: Never Smoker   . Smokeless tobacco: Not on file  . Alcohol Use: No  . Drug Use: No  . Sexually Active:    Other Topics Concern  . Not on file   Social History Narrative  . No narrative on file    No current facility-administered medications on file prior to encounter.   Current Outpatient Prescriptions on File Prior to Encounter  Medication Sig Dispense Refill  . Olmesartan-Amlodipine-HCTZ (TRIBENZOR) 40-10-25 MG TABS Take 1 tablet by mouth daily.        No Known  Allergies    Objective: Vital signs in last 24 hours: Filed Vitals:   05/20/11 0025 05/20/11 0547 05/20/11 0839 05/20/11 0843  BP: 143/90 133/88 137/91 137/91  Pulse: 96 96    Temp:  98 F (36.7 C)    TempSrc:  Oral    Resp:  18    Height:      Weight:  100.3 kg (221 lb 1.9 oz)    SpO2:  97%      Intake/Output from previous day:  Intake/Output Summary (Last 24 hours) at 05/20/11 1233 Last data filed at 05/20/11 0548  Gross per 24 hour  Intake    480 ml  Output   2450 ml  Net  -1970 ml    Medications: Scheduled Meds:   . amLODipine  5 mg Oral Daily  . aspirin EC  325 mg Oral Daily  . carvedilol  6.25 mg Oral BID WC  . furosemide  40 mg Intravenous BID  . heparin  5,000 Units Subcutaneous Q8H  . insulin aspart  0-15 Units Subcutaneous TID WC  . insulin aspart  0-5 Units Subcutaneous QHS  . lisinopril  20 mg Oral Daily  . living well with diabetes book   Does not apply Once  . potassium chloride  20 mEq Oral Daily  . potassium chloride  40 mEq Oral Once  . sodium chloride  3 mL Intravenous Q12H  . DISCONTD: carvedilol  3.125 mg Oral BID  WC  . DISCONTD: nitroGLYCERIN  0.5 inch Topical Q6H  . DISCONTD: nitroGLYCERIN  1 inch Topical Q6H   Continuous Infusions:  PRN Meds:.sodium chloride, acetaminophen, ondansetron (ZOFRAN) IV, sodium chloride  Physical Exam: Vital Signs:  Reviewed...  General:  WD, Oberweight, 46 y/o BF in NAD; alert & oriented; pleasant & cooperative... HEENT:  [place]/AT; Conjunctiva- pink, Sclera- nonicteric, EOM-wnl, PERRLA, Fundi-benign; EACs-clear, TMs-wnl; NOSE-clear; THROAT-clear & wnl. Neck:  Supple w/ full ROM; no JVD; normal carotid impulses w/o bruits; no thyromegaly or nodules palpated; no lymphadenopathy. Chest:  Clear to P & A now, few residual rales at bases bilat, no wheezing, no rhonchi. Heart:  Regular Rhythm, tachycardia; norm S1 & S2 w/ Gr1/6 SEM, cannot apprec gallop... Abdomen:  Soft & nontender- no guarding or rebound; normal  bowel sounds; no organomegaly or masses palpated. Ext:  Normal ROM; without deformities or arthritic changes; no varicose veins, +venous insuffic, tr edema;  Pulses intact w/o bruits. Neuro:  CNs II-XII intact; motor testing normal; sensory testing normal; gait normal & balance OK. Derm:  No lesions noted; no rash, she has 2 tatoos- butterfly on left volar wrist & rose on left chest wall. Lymph:  No cervical, supraclavicular, axillary, or inguinal adenopathy palpated.  Lab Results:  Lab 05/20/11 0500 05/19/11 0830 05/19/11 0001 05/18/11 1550  NA 137 134* -- 137  K 3.3* 3.6 -- 3.9  CL 101 101 -- 105  CO2 27 24 -- 23  BUN 11 10 -- 14  CREATININE 0.78 0.77 0.82 --  GLUCOSE 167* 221* -- 145*  ]  Lab 05/19/11 0830 05/19/11 0001 05/18/11 1550  HGB 12.2 12.2 13.0  HCT 37.4 37.3 40.6  WBC 10.1 14.5* 7.5  PLT 405* 409* 417*  }  Culture Results: No results found for this or any previous visit (from the past 240 hour(s)).  Studies/Results: Dg Chest 2 View  05/18/2011  *RADIOLOGY REPORT*  Clinical Data: Shortness of breath, wheezing  CHEST - 2 VIEW  Comparison: None.  Findings: Cardiomediastinal silhouette is unremarkable.  There is mild interstitial prominence bilaterally without focal infiltrate. Early edema or pneumonitis cannot be excluded.  Clinical correlation is necessary.  IMPRESSION: Mild interstitial prominence bilaterally.  Early edema or pneumonitis cannot be excluded.  No focal infiltrate.  Original Report Authenticated By: Natasha Mead, M.D.   Ct Angio Chest W/cm &/or Wo Cm  05/18/2011  *RADIOLOGY REPORT*  Clinical Data: Shortness of breath  CT ANGIOGRAPHY CHEST  Technique:  Multidetector CT imaging of the chest using the standard protocol during bolus administration of intravenous contrast. Multiplanar reconstructed images including MIPs were obtained and reviewed to evaluate the vascular anatomy.  Contrast: 80mL OMNIPAQUE IOHEXOL 300 MG/ML  SOLN  Comparison: None.  Findings: No  central pulmonary embolus is noted. Limited assessment of the segmental and subsegmental arterial branches due to poor bolus contrast opacification.  There is no mediastinal hematoma or adenopathy.  Heart size is within normal limits.  No pericardial effusion.  No hilar adenopathy.  Bilateral upper lobe patchy central airway disease noted. There is a hazy airspace disease bilateral lower lobe with patchy ground-glass attenuation.  Findings are highly suspicious for bilateral pneumonia.  Clinical correlation is necessary to exclude bilateral alveolar proteinosis, aspiration or hemorrhage.  Less likely allergic bronchopulmonary aspergillosis or alveolar sarcoid. Clinical correlation is necessary.  No pulmonary edema.  No diagnostic pneumothorax.  No destructive bony lesions are noted.  The visualized upper abdomen is unremarkable.  Small hiatal hernia.  IMPRESSION:  1.  No  central pulmonary embolus is noted.  Limited assessment of the segmental and subsegmental arterial branches due to poor bolus opacification. 2.  Bilateral upper lobe patchy central airway disease noted. There is a hazy airspace disease bilateral lower lobe with patchy ground-glass attenuation.  Findings are highly suspicious for bilateral pneumonia.  Clinical correlation is necessary to exclude bilateral alveolar proteinosis, aspiration or hemorrhage.  Less likely allergic bronchopulmonary aspergillosis or alveolar sarcoid. 3.  No mediastinal hematoma or adenopathy.  Original Report Authenticated By: Natasha Mead, M.D.    Assessment/Plan: 1) Sudden onset Dyspnea & chest discomfort (heaviness) occurring at rest & assoc w/ abn CXR showing diffuse interstitial prominence, acute hypoxemic resp failure (sat's in 80s in ER), elev BNP (1257) and D-dimer (1.98, norm<0.48); there was no prodrome illness etc... CTAngio reviewed (no PE seen). >>likely pulm edema & 2DEcho may be revealing, plus response to Lasix diuresis (f/u CXR, BMet, BNP)... >>r/o other  ILD w/ collagen vasc screen, ACE, IgE, and check PCT & HIV for completeness... >> agree w/ O2 to maintain sats>90;  Lasix diuresis & holding on antibiotics for now.  2) HBP w/ hx poor med compliance: >>BP= 130-140/90 here... >>on Amlodipine, Coreg, Lisinopril, Lasix now... >>KCl replacement as needed...  3) DM w/ hx poor med compliance:  >>BS= 145-221, A1c=7.6 >>on SSI now...  4) Best Practice: >>on ASA, SQ heparin... >> consider Pepcid   Michele Mcalpine, MD 05/20/2011, 12:33 PM

## 2011-05-21 ENCOUNTER — Inpatient Hospital Stay (HOSPITAL_COMMUNITY): Payer: Medicaid Other

## 2011-05-21 ENCOUNTER — Encounter (HOSPITAL_COMMUNITY)
Admission: EM | Disposition: A | Discharge status: Home / Self Care | Payer: Self-pay | Source: Home / Self Care | Attending: Internal Medicine

## 2011-05-21 DIAGNOSIS — I428 Other cardiomyopathies: Secondary | ICD-10-CM | POA: Insufficient documentation

## 2011-05-21 DIAGNOSIS — I509 Heart failure, unspecified: Secondary | ICD-10-CM

## 2011-05-21 HISTORY — PX: LEFT HEART CATHETERIZATION WITH CORONARY ANGIOGRAM: SHX5451

## 2011-05-21 LAB — GLUCOSE, CAPILLARY
Glucose-Capillary: 143 mg/dL — ABNORMAL HIGH (ref 70–99)
Glucose-Capillary: 113 mg/dL — ABNORMAL HIGH (ref 70–99)
Glucose-Capillary: 222 mg/dL — ABNORMAL HIGH (ref 70–99)

## 2011-05-21 LAB — POCT I-STAT 3, VENOUS BLOOD GAS (G3P V)
Acid-base deficit: 3 mmol/L — ABNORMAL HIGH (ref 0.0–2.0)
O2 Saturation: 67 %
pCO2, Ven: 40.8 mmHg — ABNORMAL LOW (ref 45.0–50.0)

## 2011-05-21 LAB — BASIC METABOLIC PANEL
CO2: 26 mEq/L (ref 19–32)
Chloride: 102 mEq/L (ref 96–112)
Glucose, Bld: 169 mg/dL — ABNORMAL HIGH (ref 70–99)
Potassium: 3.7 mEq/L (ref 3.5–5.1)
Sodium: 136 mEq/L (ref 135–145)

## 2011-05-21 LAB — CREATININE, SERUM
Creatinine, Ser: 0.87 mg/dL (ref 0.50–1.10)
GFR calc Af Amer: >90 mL/min (ref >90)
GFR calc non Af Amer: 80 mL/min — ABNORMAL LOW (ref >90)

## 2011-05-21 LAB — PRO B NATRIURETIC PEPTIDE: Pro B Natriuretic peptide (BNP): 209.7 pg/mL — ABNORMAL HIGH (ref 0–125)

## 2011-05-21 LAB — CBC
Hemoglobin: 12.9 g/dL (ref 12.0–15.0)
MCHC: 32.1 g/dL (ref 30.0–36.0)
WBC: 7.8 10*3/uL (ref 4.0–10.5)

## 2011-05-21 LAB — POCT I-STAT 3, ART BLOOD GAS (G3+)
Bicarbonate: 23.9 mEq/L (ref 20.0–24.0)
pH, Arterial: 7.407 — ABNORMAL HIGH (ref 7.350–7.400)

## 2011-05-21 SURGERY — LEFT HEART CATHETERIZATION WITH CORONARY ANGIOGRAM
Anesthesia: LOCAL

## 2011-05-21 MED ORDER — ONDANSETRON HCL 4 MG/2ML IJ SOLN: 4.0000 mg | Freq: Four times a day (QID) | INTRAMUSCULAR | Status: DC | PRN

## 2011-05-21 MED ORDER — HEPARIN (PORCINE) IN NACL 2-0.9 UNIT/ML-% IJ SOLN
INTRAMUSCULAR | Status: AC
  Filled 2011-05-21: qty 2000

## 2011-05-21 MED ORDER — LIDOCAINE HCL (PF) 1 % IJ SOLN
INTRAMUSCULAR | Status: AC
  Filled 2011-05-21: qty 30

## 2011-05-21 MED ORDER — MIDAZOLAM HCL 2 MG/2ML IJ SOLN
INTRAMUSCULAR | Status: AC
  Filled 2011-05-21: qty 2

## 2011-05-21 MED ORDER — SODIUM CHLORIDE 0.9 % IJ SOLN: 3.0000 mL | INTRAMUSCULAR | Status: DC | PRN

## 2011-05-21 MED ORDER — DIAZEPAM 2 MG PO TABS: 2.0000 mg | ORAL_TABLET | ORAL | Status: DC

## 2011-05-21 MED ORDER — ENOXAPARIN SODIUM 40 MG/0.4ML ~~LOC~~ SOLN
40.0000 mg | SUBCUTANEOUS | Status: DC
  Administered 2011-05-22: 40 mg via SUBCUTANEOUS
  Filled 2011-05-21 (×2): qty 0.4

## 2011-05-21 MED ORDER — ASPIRIN 81 MG PO CHEW
324.0000 mg | CHEWABLE_TABLET | ORAL | Status: DC
  Filled 2011-05-21: qty 4

## 2011-05-21 MED ORDER — SODIUM CHLORIDE 0.9 % IV SOLN: 250.0000 mL | INTRAVENOUS | Status: DC | PRN

## 2011-05-21 MED ORDER — SODIUM CHLORIDE 0.9 % IV SOLN: INTRAVENOUS | Status: DC

## 2011-05-21 MED ORDER — ACETAMINOPHEN 325 MG PO TABS: 650.0000 mg | ORAL_TABLET | ORAL | Status: DC | PRN

## 2011-05-21 MED ORDER — FENTANYL CITRATE 0.05 MG/ML IJ SOLN
INTRAMUSCULAR | Status: AC
  Filled 2011-05-21: qty 2

## 2011-05-21 MED ORDER — SODIUM CHLORIDE 0.9 % IV SOLN: INTRAVENOUS | Status: AC

## 2011-05-21 MED ORDER — NITROGLYCERIN 0.2 MG/ML ON CALL CATH LAB
INTRAVENOUS | Status: AC
  Filled 2011-05-21: qty 1

## 2011-05-21 MED ORDER — SODIUM CHLORIDE 0.9 % IJ SOLN: 3.0000 mL | Freq: Two times a day (BID) | INTRAMUSCULAR | Status: DC

## 2011-05-21 NOTE — Progress Notes (Signed)
SUBJECTIVE:  Patient denies CP or SOB  OBJECTIVE:   Vitals:   Filed Vitals:   05/20/11 0843 05/20/11 1344 05/20/11 2125 05/21/11 0455  BP: 137/91 131/86 118/85 117/80  Pulse:  96 104 96  Temp:  97.7 F (36.5 C) 98.2 F (36.8 C) 98.5 F (36.9 C)  TempSrc:  Oral Oral Oral  Resp:  18 18 20   Height:      Weight:    218 lb 14.7 oz (99.3 kg)  SpO2:  94% 95% 96%   I&O's:    Intake/Output Summary (Last 24 hours) at 05/21/11 0733 Last data filed at 05/21/11 0456  Gross per 24 hour  Intake    840 ml  Output   2700 ml  Net  -1860 ml   TELEMETRY: Reviewed telemetry pt in NSR:     PHYSICAL EXAM General: Well developed, well nourished, in no acute distress Head: Normal Lungs:   Clear bilaterally to auscultation and percussion. Heart:   HRRR S1 S2  Abdomen: Bowel sounds are positive, abdomen soft and non-tender without masses Extremities:   No edema.   Neuro: Alert and oriented X 3. Psych:  Good affect, responds appropriately   LABS: Basic Metabolic Panel:  Basename 05/21/11 0440 05/20/11 0500  NA 136 137  K 3.7 3.3*  CL 102 101  CO2 26 27  GLUCOSE 169* 167*  BUN 17 11  CREATININE 0.86 0.78  CALCIUM 9.2 8.8  MG -- --  PHOS -- --   CBC:  Basename 05/19/11 0830 05/19/11 0001 05/18/11 1550  WBC 10.1 14.5* --  NEUTROABS 7.5 -- 4.2  HGB 12.2 12.2 --  HCT 37.4 37.3 --  MCV 86.2 87.4 --  PLT 405* 409* --   Cardiac Enzymes:  Basename 05/19/11 1529 05/19/11 0830 05/19/11 0001  CKTOTAL 207* 247* 181*  CKMB 1.3 1.8 1.7  CKMBINDEX -- -- --  TROPONINI <0.30 <0.30 <0.30   D-Dimer:  Basename 05/18/11 1550  DDIMER 1.98*   Hemoglobin A1C:  Basename 05/19/11 0001  HGBA1C 7.6*   Thyroid Function Tests:  Basename 05/19/11 0001  TSH 2.242  T4TOTAL --  T3FREE --  THYROIDAB --     1) Acute systolic CHF - Echo shows EF 35 with mild to moderate MR. Reduced EF most likely from hypertension. However, she presented with fairly acute pulmonary edema and chest  pressure; enzymes neg. Feel R and L cath indicated to R/O CAD; risks and benefits discussed and patient agrees to proceed. Note TSH normal; no H/O ETOH abuse. Continue lisinopril and coreg (titrate to control BP and for CM); DC norvasc. Hold lasix prior to cath. 2) Hypertension - BP improving; dc norvasc; follow BP and increase coreg/lisinopril as needed. 3) CM - Cath today as outlined; repeat echo in 3 months; hopefully BP will improve with BP control; If EF<35, would need ICD 4) DM- FU primary care after DC.  Olga Millers, MD  05/21/2011  7:33 AM

## 2011-05-21 NOTE — Consult Note (Signed)
Inpatient Progress Note Patient ID: [name] MRN: [id number] DOB/AGE: [birth year] 46 y.o.  Primary:  Dr. Alwyn Pea [facility])  Subjective: Pt has resolved all pulm symptoms.  For cath this PM  Objective: Vital signs in last 24 hours: Filed Vitals:   05/20/11 1344 05/20/11 2125 05/21/11 0455 05/21/11 0820  BP: 131/86 118/85 117/80 120/86  Pulse: 96 104 96 94  Temp: 97.7 F (36.5 C) 98.2 F (36.8 C) 98.5 F (36.9 C)   TempSrc: Oral Oral Oral   Resp: 18 18 20    Height:      Weight:   218 lb 14.7 oz (99.3 kg)   SpO2: 94% 95% 96%     Intake/Output from previous day:  Intake/Output Summary (Last 24 hours) at 05/21/11 1122 Last data filed at 05/21/11 0456  Gross per 24 hour  Intake    600 ml  Output   2700 ml  Net  -2100 ml    Medications: Scheduled Meds:    . aspirin EC  325 mg Oral Daily  . carvedilol  6.25 mg Oral BID WC  . insulin aspart  0-15 Units Subcutaneous TID WC  . insulin aspart  0-5 Units Subcutaneous QHS  . lisinopril  20 mg Oral Daily  . living well with diabetes book   Does not apply Once  . potassium chloride  20 mEq Oral Daily  . sodium chloride  3 mL Intravenous Q12H  . DISCONTD: amLODipine  5 mg Oral Daily  . DISCONTD: furosemide  40 mg Intravenous BID  . DISCONTD: heparin  5,000 Units Subcutaneous Q8H   Continuous Infusions:  PRN Meds:.sodium chloride, acetaminophen, ondansetron (ZOFRAN) IV, sodium chloride  Physical Exam: Vital Signs:  BP 120/86  Pulse 94  Temp(Src) 98.5 F (36.9 C) (Oral)  Resp 20  Ht 5\' 9"  (1.753 m)  Wt 218 lb 14.7 oz (99.3 kg)  BMI 32.33 kg/m2  SpO2 96%  LMP 05/16/2011 .  General:  WD, Oberweight, 46 y/o BF in NAD; alert & oriented; pleasant & cooperative...nad on room air HEENT:  [place]/AT; Conjunctiva- pink, Sclera- nonicteric, EOM-wnl, PERRLA, Fundi-benign; EACs-clear, TMs-wnl; NOSE-clear; THROAT-clear & wnl. Neck:  Supple w/ full ROM; no JVD; normal carotid impulses w/o bruits; no  thyromegaly or nodules palpated; no lymphadenopathy. Chest:  Clear to P & A now, few residual rales at bases bilat, no wheezing, no rhonchi. Heart:  Regular Rhythm, tachycardia; norm S1 & S2 w/ Gr1/6 SEM, cannot apprec gallop... Abdomen:  Soft & nontender- no guarding or rebound; normal bowel sounds; no organomegaly or masses palpated. Ext:  Normal ROM; without deformities or arthritic changes; no varicose veins, +venous insuffic, tr edema;  Pulses intact w/o bruits. Neuro:  CNs II-XII intact; motor testing normal; sensory testing normal; gait normal & balance OK. Derm:  No lesions noted; no rash, she has 2 tatoos- butterfly on left volar wrist & rose on left chest wall. Lymph:  No cervical, supraclavicular, axillary, or inguinal adenopathy palpated.  Lab Results:  Lab 05/21/11 0440 05/20/11 0500 05/19/11 0830  NA 136 137 134*  K 3.7 3.3* 3.6  CL 102 101 101  CO2 26 27 24   BUN 17 11 10   CREATININE 0.86 0.78 0.77  GLUCOSE 169* 167* 221*  ]  Lab 05/19/11 0830 05/19/11 0001 05/18/11 1550  HGB 12.2 12.2 13.0  HCT 37.4 37.3 40.6  WBC 10.1 14.5* 7.5  PLT 405* 409* 417*  }  Culture Results: No results found for this or any previous visit (from the  past 240 hour(s)).  Studies/Results: Dg Chest 2 View  05/21/2011  *RADIOLOGY REPORT*  Clinical Data: Pulmonary edema.  Cough.  Diabetes.  CHEST - 2 VIEW  Comparison: 05/18/2011  Findings: Prior interstitial accentuation is resolved.  Cardiac and mediastinal contours appear unremarkable.  No pleural effusion noted. Linear opacity projecting over a button or radiolucent cardiac lead along the left anterior second rib is probably outside the patient.  IMPRESSION:  1.  Normal.  Edema has resolved.  Original Report Authenticated By: Dellia Cloud, M.D.    Assessment/Plan: 1) Sudden onset Dyspnea & chest discomfort (heaviness) occurring at rest & assoc w/ abn CXR showing diffuse interstitial prominence, acute hypoxemic resp failure (sat's in  80s in ER), elev BNP (1257) and D-dimer (1.98, norm<0.48); there was no prodrome illness etc... CTAngio reviewed (no PE seen). It appears all data point to LV dysfunction d/t stiff LV> pulm edema. CXR 4/8 now clear. Pt for heart cath this pm Plan: Pulmonary will sign off  -2) HBP w/ hx poor med compliance: >>BP= 130-140/90 here... >>on Amlodipine, Coreg, Lisinopril, Lasix now... >>KCl replacement as needed...  3) DM w/ hx poor med compliance:  >>BS= 145-221, A1c=7.6 >>on SSI now...  4) Best Practice: >>on ASA, SQ heparin...   Brett Canales Minor ACNP [facility] PCCM Pager [phone] till 3 pm If no answer page [phone] 05/21/2011, 11:24 AM  I have seen and examined this patient with the nurse practionner and agree with the above assessment and plan.  Shan Levans Beeper  [phone]  Cell  [phone]  If no response or cell goes to voicemail, call beeper [phone]   PCCM will sign off, call again prn .

## 2011-05-21 NOTE — CV Procedure (Signed)
   Cardiac Catheterization Procedure Note  Name: [name] MRN: [id number] DOB: January 21, 1966  Procedure: Right Heart Cath, Left Heart Cath, Selective Coronary Angiography, LV angiography  Indication: Acute systolic heart failure, pulmonary edema, LVEF by echo 35%.   Procedural Details: The right groin was prepped, draped, and anesthetized with 1% lidocaine. Using the modified Seldinger technique a 5 French sheath was placed in the right femoral artery and a 7 French sheath was placed in the right femoral vein. A Swan-Ganz catheter was used for the right heart catheterization. Standard protocol was followed for recording of right heart pressures and sampling of oxygen saturations. Fick cardiac output was calculated. Standard Judkins catheters were used for selective coronary angiography and left ventriculography. There were no immediate procedural complications. The patient was transferred to the post catheterization recovery area for further monitoring.  Procedural Findings: Hemodynamics RA 10 RV 30/11 PA 30/20 with a mean of 25 PCWP 16 LV 124/13 AO 123/77 with a mean of 97  Oxygen saturations: PA 67% AO 93%  Cardiac Output (Fick) 6.6  Cardiac Index (Fick) 3.1   Coronary angiography: Coronary dominance: right  Left mainstem: Widely patent with no obstructive coronary disease.  Left anterior descending (LAD): Angiographically normal vessel, reaches the LV apex, supplies a single moderate caliber diagonal without stenosis  Left circumflex (LCx): Large caliber vessel, first and second obtuse marginal branches are small, third OM is large in caliber. Angiographically normal vessel.  Right coronary artery (RCA): Dominant vessel, widely patent supplying the PDA branch. There is catheter induced vasospasm. No significant abnormalities.  Left ventriculography: There is moderate global left ventricular systolic dysfunction with left ventricular ejection fraction estimated at 35-40%.  There is no significant mitral regurgitation by angiography.  Final Conclusions:   1. Widely patent coronary arteries with no significant CAD 2. Moderate global systolic LV dysfunction 3. Mildly elevated right heart pressures with normal left heart pressures 4. Preserved cardiac output  Recommendations: Continue medical therapy for nonischemic cardiomyopathy. The patient will transfer back to [facility] for further care.  Tonny Bollman 05/21/2011, 5:31 PM

## 2011-05-22 ENCOUNTER — Encounter (HOSPITAL_COMMUNITY): Payer: Self-pay | Admitting: Cardiology

## 2011-05-22 DIAGNOSIS — I5021 Acute systolic (congestive) heart failure: Secondary | ICD-10-CM | POA: Diagnosis present

## 2011-05-22 DIAGNOSIS — I428 Other cardiomyopathies: Secondary | ICD-10-CM

## 2011-05-22 DIAGNOSIS — I10 Essential (primary) hypertension: Secondary | ICD-10-CM

## 2011-05-22 LAB — GLUCOSE, CAPILLARY
Glucose-Capillary: 125 mg/dL — ABNORMAL HIGH (ref 70–99)
Glucose-Capillary: 183 mg/dL — ABNORMAL HIGH (ref 70–99)

## 2011-05-22 MED ORDER — POTASSIUM CHLORIDE CRYS ER 20 MEQ PO TBCR: 20.0000 meq | EXTENDED_RELEASE_TABLET | Freq: Every day | ORAL | Status: DC

## 2011-05-22 MED ORDER — FUROSEMIDE 40 MG PO TABS: 40.0000 mg | ORAL_TABLET | Freq: Every day | ORAL | Status: DC

## 2011-05-22 MED ORDER — CARVEDILOL 6.25 MG PO TABS: 6.2500 mg | ORAL_TABLET | Freq: Two times a day (BID) | ORAL | Status: DC

## 2011-05-22 MED ORDER — FUROSEMIDE 40 MG PO TABS
40.0000 mg | ORAL_TABLET | Freq: Every day | ORAL | Status: DC
  Administered 2011-05-22: 40 mg via ORAL
  Filled 2011-05-22 (×2): qty 1

## 2011-05-22 MED ORDER — LISINOPRIL 20 MG PO TABS: 20.0000 mg | ORAL_TABLET | Freq: Every day | ORAL | Status: DC

## 2011-05-22 NOTE — Progress Notes (Signed)
SUBJECTIVE:  Patient denies CP or SOB  OBJECTIVE:   Vitals:   Filed Vitals:   05/21/11 2124 05/21/11 2200 05/22/11 0222 05/22/11 0504  BP: 115/82 107/77 116/79 108/73  Pulse: 92 105 92 95  Temp: 97.8 F (36.6 C) 97.9 F (36.6 C) 97.4 F (36.3 C) 97.7 F (36.5 C)  TempSrc: Oral Oral Oral Oral  Resp: 18 20 18 20   Height:      Weight:    219 lb 5.7 oz (99.5 kg)  SpO2: 95% 98% 94% 97%   I&O's:    Intake/Output Summary (Last 24 hours) at 05/22/11 0615 Last data filed at 05/21/11 2015  Gross per 24 hour  Intake      0 ml  Output    300 ml  Net   -300 ml   TELEMETRY: Reviewed telemetry pt in NSR:     PHYSICAL EXAM General: Well developed, well nourished, in no acute distress Head: Normal Lungs:   Clear bilaterally to auscultation Heart:   HRRR S1 S2  Abdomen: Bowel sounds are positive, abdomen soft and non-tender without masses; Right groin with no hematoma and no bruit Extremities:   No edema.   Neuro: Alert and oriented X 3. Psych:  Good affect, responds appropriately   LABS: Basic Metabolic Panel:  Basename 05/21/11 2015 05/21/11 0440 05/20/11 0500  NA -- 136 137  K -- 3.7 3.3*  CL -- 102 101  CO2 -- 26 27  GLUCOSE -- 169* 167*  BUN -- 17 11  CREATININE 0.87 0.86 --  CALCIUM -- 9.2 8.8  MG -- -- --  PHOS -- -- --   CBC:  Basename 05/21/11 2015 05/19/11 0830  WBC 7.8 10.1  NEUTROABS -- 7.5  HGB 12.9 12.2  HCT 40.2 37.4  MCV 87.8 86.2  PLT 444* 405*   Cardiac Enzymes:  Basename 05/19/11 1529 05/19/11 0830  CKTOTAL 207* 247*  CKMB 1.3 1.8  CKMBINDEX -- --  TROPONINI <0.30 <0.30    1) Acute systolic CHF - Echo shows EF 35 with mild to moderate MR. Reduced EF most likely from hypertension. Cath shows no CAD. Note TSH normal; no H/O ETOH abuse. Continue lisinopril and coreg (titrate as outpatient); Resume lasix at 40 mg daily. 2) Hypertension - BP controlled. 3) CM - repeat echo in 3 months once meds fully titrated; hopefully EF will improve with  BP control; If EF<35, would need ICD 4) DM- FU primary care after DC. DC today; BMET one week; fu with me 2-4 weeks >30 min PA and physician time D2 Olga Millers, MD  05/22/2011  6:15 AM

## 2011-05-22 NOTE — Discharge Summary (Signed)
Discharge Summary   Patient ID: [name] MRN: [id number], DOB/AGE: [birth year] 44 y.o.  Primary MD: Dr. Alwyn Pea [facility]) Primary Cardiologist: Dr. Jens Som  Admit date: 05/18/2011 D/C date:     05/22/2011      Primary Discharge Diagnoses:  1. Acute Systolic CHF  - Felt 2/2 nonischemic cardiomyopathy from uncontrolled HTN  - Echo 05/19/11 EF 35% w/ mild-mod MR  - R/L Heart Cath 05/21/11 - mildly elevated right heart pressures, normal left heart pressures, preserved cardiac output, widely patent coronary arteries without significant CAD and moderate global systolic LV dysfunction, EF 35-40%.  - initiated on Lasix, Coreg and Lisinopril, uptitrate as outpatient as tolerated  - BMET in one week  2. Nonischemic cardiomyopathy  - Repeat echo in 3mos once meds fully titrated, If EF <35% consider ICD  3. Diabetes Mellitus, Type 2  - previously pre-diabetic, A1C 7.6 this admission  - F/u with PCP as outpatient  Secondary Discharge Diagnoses:  1. HTN 2. Cervical surgery  Allergies No Known Allergies  Diagnostic Studies/Procedures:   05/21/11 - Right and Left Heart Cath Hemodynamics  RA 10  RV 30/11  PA 30/20 with a mean of 25  PCWP 16  LV 124/13  AO 123/77 with a mean of 97  Oxygen saturations:  PA 67%  AO 93%  Cardiac Output (Fick) 6.6  Cardiac Index (Fick) 3.1  Coronary angiography:  Coronary dominance: right  Left mainstem: Widely patent with no obstructive coronary disease.  Left anterior descending (LAD): Angiographically normal vessel, reaches the LV apex, supplies a single moderate caliber diagonal without stenosis  Left circumflex (LCx): Large caliber vessel, first and second obtuse marginal branches are small, third OM is large in caliber. Angiographically normal vessel.  Right coronary artery (RCA): Dominant vessel, widely patent supplying the PDA branch. There is catheter induced vasospasm. No significant abnormalities.  Left  ventriculography: There is moderate global left ventricular systolic dysfunction with left ventricular ejection fraction estimated at 35-40%. There is no significant mitral regurgitation by angiography.  Final Conclusions:  1. Widely patent coronary arteries with no significant CAD  2. Moderate global systolic LV dysfunction  3. Mildly elevated right heart pressures with normal left heart pressures  4. Preserved cardiac output  Recommendations: Continue medical therapy for nonischemic cardiomyopathy. The patient will transfer back to [facility] for further care.  05/19/11 - 2D Echocardiogram Study Conclusions:  - Left ventricle: The cavity size was moderately dilated. The estimated ejection fraction was 35%.  - Mitral valve: Mild to moderate regurgitation.  05/18/2011 - CTA chest Findings: No central pulmonary embolus is noted. Limited assessment of the segmental and subsegmental arterial branches due to poor bolus contrast opacification.  There is no mediastinal hematoma or adenopathy.  Heart size is within normal limits.  No pericardial effusion.  No hilar adenopathy.  Bilateral upper lobe patchy central airway disease noted. There is a hazy airspace disease bilateral lower lobe with patchy ground-glass attenuation.  Findings are highly suspicious for bilateral pneumonia.  Clinical correlation is necessary to exclude bilateral alveolar proteinosis, aspiration or hemorrhage.  Less likely allergic bronchopulmonary aspergillosis or alveolar sarcoid. Clinical correlation is necessary.  No pulmonary edema.  No diagnostic pneumothorax.  No destructive bony lesions are noted.  The visualized upper abdomen is unremarkable.  Small hiatal hernia.  IMPRESSION:  1.  No central pulmonary embolus is noted.  Limited assessment of the segmental and subsegmental arterial branches due to poor bolus opacification. 2.  Bilateral upper lobe patchy  central airway disease noted. There is a hazy airspace disease  bilateral lower lobe with patchy ground-glass attenuation.  Findings are highly suspicious for bilateral pneumonia.  Clinical correlation is necessary to exclude bilateral alveolar proteinosis, aspiration or hemorrhage.  Less likely allergic bronchopulmonary aspergillosis or alveolar sarcoid. 3.  No mediastinal hematoma or adenopathy.     History of Present Illness: 46 y.o. female w/ above medical problems who presented to [facility] on 05/19/11 with complaints of shortness of breath and chest discomfort.  On the day of presentation she reported developing chest heaviness and abrupt shortness of breath while sitting and laying in bed. She also began coughing up whitish sputum. She had not had any episodes like this before. The chest pain did not radiate, was not associated with any nausea,  vomiting, or diaphoresis, just felt like a heaviness and a tight wheezing feeling. She had been out of her medications, including antihypertensives for some time.  Hospital Course: In the ED her O2 sats were in the low 80s requiring O2 supplementation. Her BP was as high as 172/109. EKG revealed sinus tachycardia 122bpm, with no acute ST/T changes. CXR revealed bilateral interstitial infiltrates with slight pulmonary edema. Labs significant for pBNP 1257, DDimer 1.98, glucose 145, WBC 7.5. CTA chest was without PE, but showed bilateral upper lobe patchy central airway disease with ground-glass attenuation worrisome for bilateral pneumonia. She was admitted for further evaluation and treatment.  Cardiac enzymes were cycled and negative. Echocardiogram revealed EF 35% with mild-mod MR. It was felt her EF reduction was most likely from uncontrolled hypertension. She was diuresed with IV lasix that was transitioned to oral lasix with improvement in symptoms. She was placed on Coreg which was uptitrated to 6.25mg  BID. As she had difficulty affording her medications, she was placed on generic antihypertensives  including Lisinopril. She underwent right and left heart cath on 05/21/11 which revealed mildly elevated right heart pressures, normal left heart pressures, preserved cardiac output, widely patent coronary arteries without significant CAD and moderate global systolic LV dysfunction, EF 35-40%.  She was evaluated by pulmonology who felt her dyspnea was likely r/t pulmonary edema and recommended a work up to rule out other ILD w/ collagen vasc screen including ACE, IgE, PCT, & HIV. Follow up CXR on 05/21/11 was without infiltrates or edema. A1C was found to be elevated at 7.6 for which she will need follow up with her PCP as an outpatient.  She was seen and evaluated by Dr. Jens Som who felt she was stable for discharge home with plans for follow up as scheduled below.  Discharge Vitals: Blood pressure 114/84, pulse 96, temperature 98.4 F (36.9 C), temperature source Oral, resp. rate 20, height 5\' 9"  (1.753 m), weight 219 lb 5.7 oz (99.5 kg), last menstrual period 05/16/2011, SpO2 96.00%.  Labs: Component Value Date   WBC 7.8 05/21/2011   HGB 12.9 05/21/2011   HCT 40.2 05/21/2011   MCV 87.8 05/21/2011   PLT 444* 05/21/2011    Lab 05/21/11 2015 05/21/11 0440  NA -- 136  K -- 3.7  CL -- 102  CO2 -- 26  BUN -- 17  CREATININE 0.87 --  CALCIUM -- 9.2  GLUCOSE -- 169*    05/19/2011 00:01 05/19/2011 08:30 05/19/2011 15:29  CK, MB 1.7 1.8 1.3  CK Total 181 (H) 247 (H) 207 (H)  Troponin I <0.30 <0.30 <0.30     05/19/2011 08:30 05/21/2011 04:40  Pro B Natriuretic peptide (BNP) 1257.0 (H) 209.7 (H)  Component Value Date   DDIMER 1.98* 05/18/2011    05/20/2011 14:50  Sed Rate 67 (H)     05/19/2011 00:01  Hemoglobin A1C 7.6 (H)     05/19/2011 00:01  TSH 2.242     05/20/2011 14:50  HIV NON REACTIVE    Discharge Medications   Medication List  As of 05/22/2011  4:03 PM   STOP taking these medications         TRIBENZOR 40-10-25 MG Tabs         TAKE these medications         carvedilol 6.25 MG tablet    Commonly known as: COREG   Take 1 tablet (6.25 mg total) by mouth 2 (two) times daily with a meal.      furosemide 40 MG tablet   Commonly known as: LASIX   Take 1 tablet (40 mg total) by mouth daily.      ibuprofen 200 MG tablet   Commonly known as: ADVIL,MOTRIN   Take 400 mg by mouth every 6 (six) hours as needed. pain      lisinopril 20 MG tablet   Commonly known as: PRINIVIL,ZESTRIL   Take 1 tablet (20 mg total) by mouth daily.      potassium chloride SA 20 MEQ tablet   Commonly known as: K-DUR,KLOR-CON   Take 1 tablet (20 mEq total) by mouth daily.            Disposition   Discharge Orders    Future Appointments: Provider: Department: Dept Phone: Center:   05/29/2011 11:30 AM [facility] [phone] [facility]   06/15/2011 2:15 PM Lewayne Bunting, MD [facility] [phone] [facility]     Future Orders Please Complete By Expires   Diet - low sodium heart healthy      Increase activity slowly      Discharge instructions      Comments:   **PLEASE REMEMBER TO BRING ALL OF YOUR MEDICATIONS TO EACH OF YOUR FOLLOW-UP OFFICE VISITS.  * KEEP GROIN SITE CLEAN AND DRY. Call the office for any signs of bleedings, pus, swelling, increased pain, or any other concerns. * NO HEAVY LIFTING (>10lbs) OR SEXUAL ACTIVITY X 7 DAYS. * NO DRIVING X 2-3 DAYS. * NO SOAKING BATHS, HOT TUBS, POOLS, ETC., X 7 DAYS.  * Please follow up with your primary care provider for further management of your diabetes      Follow-up Information    Follow up with [facility] on 05/29/2011. (Lab work at 11:30)    [facility] [address] [place] [address] [phone]      Follow up with Olga Millers, MD on 06/15/2011. (2:15)    Contact information:   [facility] [address] [place] [address] [place] [address] [phone]           Outstanding Labs/Studies:   1. BMET in one week  Duration of Discharge Encounter: Greater than 30 minutes including physician and PA time.  Signed, Matia Zelada PA-C 05/22/2011, 4:03 PM

## 2011-05-22 NOTE — Discharge Summary (Signed)
See progress notes [name]  

## 2011-05-25 ENCOUNTER — Telehealth: Payer: Self-pay | Admitting: Cardiology

## 2011-05-25 NOTE — Telephone Encounter (Signed)
I do not understand what this means Olga Millers

## 2011-05-25 NOTE — Telephone Encounter (Signed)
New problem:   per Efraim Kaufmann, Md need to finished the  face to  face assessment in epic for medicaid.

## 2011-05-29 ENCOUNTER — Ambulatory Visit (INDEPENDENT_AMBULATORY_CARE_PROVIDER_SITE_OTHER): Payer: Self-pay | Admitting: *Deleted

## 2011-05-29 DIAGNOSIS — I10 Essential (primary) hypertension: Secondary | ICD-10-CM

## 2011-05-29 LAB — BASIC METABOLIC PANEL
BUN: 13 mg/dL (ref 6–23)
Calcium: 9 mg/dL (ref 8.4–10.5)
Creatinine, Ser: 1 mg/dL (ref 0.4–1.2)
GFR: 74.91 mL/min (ref >60.00)
Glucose, Bld: 229 mg/dL — ABNORMAL HIGH (ref 70–99)

## 2011-06-15 ENCOUNTER — Ambulatory Visit (INDEPENDENT_AMBULATORY_CARE_PROVIDER_SITE_OTHER): Payer: Self-pay | Admitting: Cardiology

## 2011-06-15 ENCOUNTER — Encounter: Payer: Self-pay | Admitting: Cardiology

## 2011-06-15 DIAGNOSIS — I509 Heart failure, unspecified: Secondary | ICD-10-CM

## 2011-06-15 DIAGNOSIS — I5021 Acute systolic (congestive) heart failure: Secondary | ICD-10-CM

## 2011-06-15 DIAGNOSIS — E119 Type 2 diabetes mellitus without complications: Secondary | ICD-10-CM

## 2011-06-15 DIAGNOSIS — I10 Essential (primary) hypertension: Secondary | ICD-10-CM

## 2011-06-15 DIAGNOSIS — I428 Other cardiomyopathies: Secondary | ICD-10-CM

## 2011-06-15 LAB — BASIC METABOLIC PANEL
CO2: 28 mEq/L (ref 19–32)
Calcium: 9.1 mg/dL (ref 8.4–10.5)
Creatinine, Ser: 0.9 mg/dL (ref 0.4–1.2)
GFR: 88.65 mL/min (ref >60.00)
Sodium: 139 mEq/L (ref 135–145)

## 2011-06-15 MED ORDER — CARVEDILOL 12.5 MG PO TABS: 12.5000 mg | ORAL_TABLET | Freq: Two times a day (BID) | ORAL | Status: DC

## 2011-06-15 NOTE — Assessment & Plan Note (Signed)
Symptoms much improved. Continue present dose of diuretic. Check potassium and renal function.

## 2011-06-15 NOTE — Assessment & Plan Note (Signed)
Etiology unclear but may be related to uncontrolled hypertension. TSH normal. No history of alcohol abuse. Cardiac catheterization showed no coronary disease. Continue ACE inhibitor. Increase carvedilol to 12.5 mg by mouth twice a day. Continue to titrate medications. Once fully titrated plan repeat echocardiogram 3 months later to see if LV function normalized.

## 2011-06-15 NOTE — Progress Notes (Signed)
   HPI: Pleasant 46 year old female recently admitted to [facility] with congestive heart failure. Echocardiogram in April of 2013 showed an ejection fraction of 35%, moderate left ventricular enlargement, mild to moderate mitral regurgitation. HIV negative and TSH normal. Cardiac catheterization in April of 2013 showed normal coronary arteries and an ejection fraction of 35-40%. Patient was treated with medications and her cardiomyopathy was felt possibly secondary to hypertension. Since she was discharged, the patient denies any dyspnea on exertion, orthopnea, PND, pedal edema, palpitations, syncope or chest pain.   Current Outpatient Prescriptions  Medication Sig Dispense Refill  . carvedilol (COREG) 6.25 MG tablet Take 1 tablet (6.25 mg total) by mouth 2 (two) times daily with a meal.  60 tablet  6  . furosemide (LASIX) 40 MG tablet Take 1 tablet (40 mg total) by mouth daily.  30 tablet  6  . ibuprofen (ADVIL,MOTRIN) 200 MG tablet Take 400 mg by mouth every 6 (six) hours as needed. pain      . lisinopril (PRINIVIL,ZESTRIL) 20 MG tablet Take 1 tablet (20 mg total) by mouth daily.  30 tablet  6  . potassium chloride SA (K-DUR,KLOR-CON) 20 MEQ tablet Take 1 tablet (20 mEq total) by mouth daily.  30 tablet  6     Past Medical History  Diagnosis Date  . Diabetes mellitus, type 2     A1C 7.6 April '13  . Hypertension   . Nonischemic cardiomyopathy     Echo 05/19/11 EF 35% w/ mild-mod MR; R/L Heart Cath 05/21/11 - mildly elevated right heart pressures, normal left heart pressures, preserved cardiac output, widely patent coronary arteries without significant CAD and moderate global systolic LV dysfunction, EF 35-40%.  . Systolic CHF     Echo 05/19/11 EF 35% w/ mild-mod MR    No past surgical history on file.  History   Social History  . Marital Status: Divorced    Spouse Name: N/A    Number of Children: N/A  . Years of Education: N/A   Occupational History  . Not on file.    Social History Main Topics  . Smoking status: Never Smoker   . Smokeless tobacco: Not on file  . Alcohol Use: No  . Drug Use: No  . Sexually Active:    Other Topics Concern  . Not on file   Social History Narrative  . No narrative on file    ROS: no fevers or chills, productive cough, hemoptysis, dysphasia, odynophagia, melena, hematochezia, dysuria, hematuria, rash, seizure activity, orthopnea, PND, pedal edema, claudication. Remaining systems are negative.  Physical Exam: Well-developed well-nourished in no acute distress.  Skin is warm and dry.  HEENT is normal.  Neck is supple.  Chest is clear to auscultation with normal expansion.  Cardiovascular exam is regular rate and rhythm.  Abdominal exam nontender or distended. No masses palpated. Extremities show no edema. neuro grossly intact

## 2011-06-15 NOTE — Assessment & Plan Note (Signed)
I have stressed the importance of followup with primary care for possible diabetes mellitus.

## 2011-06-15 NOTE — Assessment & Plan Note (Signed)
Blood pressure controlled. 

## 2011-06-15 NOTE — Patient Instructions (Signed)
Your physician recommends that you schedule a follow-up appointment in: 4-6 WEEKS  INCREASE CARVEDILOL 12.5 MG TWICE DAILY  FOLLOW UP WITH PRIMARY CARE FOR DIABETES

## 2011-06-21 ENCOUNTER — Telehealth: Payer: Self-pay | Admitting: Cardiology

## 2011-06-21 NOTE — Telephone Encounter (Signed)
Pt states she has been calling all week re a message she received re being referred to a diabetic dr asap, and has not gotten call back

## 2011-06-21 NOTE — Telephone Encounter (Signed)
Ms. Ennis called about referral to [facility] MD @ [facility].   Informed her that for her to get an appointment she will need to pay $178.00 up front since she don't have insurance.  Patient was giving application for the assistant program thro [facility].  She stated she has apply for [employer] and is waiting the approval.  I also informed of her of the MAP program and give her the number to call and see if they could help her.  Patient stated she didn't have a job and money to pay for the MD appointment.

## 2011-06-21 NOTE — Telephone Encounter (Signed)
In speaking with pt this morning, pt states that she does not have a pcp. When discharged from the hospital she was to follow-up with her pcp regarding her diabetes (HbA1C 7.6) Pt lives near [address] Will send referral for pcp regarding her new diagnosis of diabetes. Mylo Red RN

## 2011-07-10 ENCOUNTER — Ambulatory Visit (INDEPENDENT_AMBULATORY_CARE_PROVIDER_SITE_OTHER): Payer: Self-pay | Admitting: Cardiology

## 2011-07-10 ENCOUNTER — Encounter: Payer: Self-pay | Admitting: Cardiology

## 2011-07-10 VITALS — BP 132/80 | HR 80 | Ht 69.0 in | Wt 227.0 lb

## 2011-07-10 DIAGNOSIS — E119 Type 2 diabetes mellitus without complications: Secondary | ICD-10-CM

## 2011-07-10 DIAGNOSIS — I5021 Acute systolic (congestive) heart failure: Secondary | ICD-10-CM

## 2011-07-10 DIAGNOSIS — I428 Other cardiomyopathies: Secondary | ICD-10-CM

## 2011-07-10 DIAGNOSIS — I509 Heart failure, unspecified: Secondary | ICD-10-CM

## 2011-07-10 DIAGNOSIS — I10 Essential (primary) hypertension: Secondary | ICD-10-CM

## 2011-07-10 MED ORDER — LISINOPRIL 40 MG PO TABS: 40.0000 mg | ORAL_TABLET | Freq: Every day | ORAL | Status: DC

## 2011-07-10 NOTE — Patient Instructions (Addendum)
Your physician recommends that you schedule a follow-up appointment in: 4 MONTHS WITH DR Jens Som  Your physician has requested that you have an echocardiogram. Echocardiography is a painless test that uses sound waves to create images of your heart. It provides your doctor with information about the size and shape of your heart and how well your heart's chambers and valves are working. This procedure takes approximately one hour. There are no restrictions for this procedure. SCHEDULE IN 3 MONTHS    Your physician recommends that you return for lab work in: ONE WEEK  INCREASE LISINOPRIL TO 40 MG ONCE DAILY

## 2011-07-10 NOTE — Assessment & Plan Note (Signed)
Management per primary care. 

## 2011-07-10 NOTE — Progress Notes (Signed)
   HPI: Pleasant female for fu of CM/CHF. Echocardiogram in April of 2013 showed an ejection fraction of 35%, moderate left ventricular enlargement, mild to moderate mitral regurgitation. HIV negative and TSH normal. Cardiac catheterization in April of 2013 showed normal coronary arteries and an ejection fraction of 35-40%. Patient was treated with medications and her cardiomyopathy was felt possibly secondary to hypertension. I last saw her in may of 2013. Since then, the patient denies any dyspnea on exertion, orthopnea, PND, pedal edema, palpitations, syncope or chest pain.    Current Outpatient Prescriptions  Medication Sig Dispense Refill  . carvedilol (COREG) 12.5 MG tablet Take 1 tablet (12.5 mg total) by mouth 2 (two) times daily with a meal.  60 tablet  12  . furosemide (LASIX) 40 MG tablet Take 1 tablet (40 mg total) by mouth daily.  30 tablet  6  . ibuprofen (ADVIL,MOTRIN) 200 MG tablet Take 400 mg by mouth every 6 (six) hours as needed. pain      . lisinopril (PRINIVIL,ZESTRIL) 20 MG tablet Take 1 tablet (20 mg total) by mouth daily.  30 tablet  6  . potassium chloride SA (K-DUR,KLOR-CON) 20 MEQ tablet Take 1 tablet (20 mEq total) by mouth daily.  30 tablet  6     Past Medical History  Diagnosis Date  . Diabetes mellitus, type 2     A1C 7.6 April '13  . Hypertension   . Nonischemic cardiomyopathy     Echo 05/19/11 EF 35% w/ mild-mod MR; R/L Heart Cath 05/21/11 - mildly elevated right heart pressures, normal left heart pressures, preserved cardiac output, widely patent coronary arteries without significant CAD and moderate global systolic LV dysfunction, EF 35-40%.  . Systolic CHF     Echo 05/19/11 EF 35% w/ mild-mod MR    No past surgical history on file.  History   Social History  . Marital Status: Divorced    Spouse Name: N/A    Number of Children: N/A  . Years of Education: N/A   Occupational History  . Not on file.   Social History Main Topics  . Smoking status:  Never Smoker   . Smokeless tobacco: Not on file  . Alcohol Use: No  . Drug Use: No  . Sexually Active:    Other Topics Concern  . Not on file   Social History Narrative  . No narrative on file    ROS: no fevers or chills, productive cough, hemoptysis, dysphasia, odynophagia, melena, hematochezia, dysuria, hematuria, rash, seizure activity, orthopnea, PND, pedal edema, claudication. Remaining systems are negative.  Physical Exam: Well-developed well-nourished in no acute distress.  Skin is warm and dry.  HEENT is normal.  Neck is supple.  Chest is clear to auscultation with normal expansion.  Cardiovascular exam is regular rate and rhythm.  Abdominal exam nontender or distended. No masses palpated. Extremities show no edema. neuro grossly intact

## 2011-07-10 NOTE — Assessment & Plan Note (Addendum)
Etiology unclear but may be related to uncontrolled hypertension. TSH normal. No history of alcohol abuse. Cardiac catheterization showed no coronary disease. Continue carvedilol 12.5 mg by mouth twice a day. Change lisinopril to 40 mg daily. BMET one week. Repeat echocardiogram 3 months later to see if LV function normalized.

## 2011-07-10 NOTE — Assessment & Plan Note (Signed)
Blood pressure controlled. 

## 2011-07-10 NOTE — Assessment & Plan Note (Signed)
Patient now euvolemic on examination. Continue present dose of Lasix. 

## 2011-07-19 ENCOUNTER — Other Ambulatory Visit: Payer: Self-pay

## 2011-08-14 ENCOUNTER — Other Ambulatory Visit: Payer: Self-pay | Admitting: Cardiology

## 2011-08-14 MED ORDER — POTASSIUM CHLORIDE CRYS ER 20 MEQ PO TBCR: 20.0000 meq | EXTENDED_RELEASE_TABLET | Freq: Every day | ORAL | Status: DC

## 2011-08-14 MED ORDER — FUROSEMIDE 40 MG PO TABS: 40.0000 mg | ORAL_TABLET | Freq: Every day | ORAL | Status: DC

## 2011-08-14 MED ORDER — LISINOPRIL 40 MG PO TABS: 40.0000 mg | ORAL_TABLET | Freq: Every day | ORAL | Status: DC

## 2011-08-14 MED ORDER — CARVEDILOL 12.5 MG PO TABS: 12.5000 mg | ORAL_TABLET | Freq: Two times a day (BID) | ORAL | Status: DC

## 2011-08-14 NOTE — Telephone Encounter (Signed)
She has a few pills left

## 2011-08-15 ENCOUNTER — Other Ambulatory Visit (INDEPENDENT_AMBULATORY_CARE_PROVIDER_SITE_OTHER): Payer: Medicaid Other

## 2011-08-15 DIAGNOSIS — I428 Other cardiomyopathies: Secondary | ICD-10-CM

## 2011-08-15 LAB — BASIC METABOLIC PANEL
BUN: 17 mg/dL (ref 6–23)
Chloride: 102 mEq/L (ref 96–112)
Creatinine, Ser: 0.9 mg/dL (ref 0.4–1.2)
GFR: 93.41 mL/min (ref >60.00)
Potassium: 3.6 mEq/L (ref 3.5–5.1)

## 2011-10-11 ENCOUNTER — Other Ambulatory Visit (HOSPITAL_COMMUNITY): Payer: Self-pay

## 2011-10-25 ENCOUNTER — Ambulatory Visit (HOSPITAL_COMMUNITY): Payer: Medicaid Other | Attending: Internal Medicine | Admitting: Radiology

## 2011-10-25 DIAGNOSIS — I428 Other cardiomyopathies: Secondary | ICD-10-CM | POA: Insufficient documentation

## 2011-10-25 DIAGNOSIS — E119 Type 2 diabetes mellitus without complications: Secondary | ICD-10-CM | POA: Insufficient documentation

## 2011-10-25 DIAGNOSIS — I10 Essential (primary) hypertension: Secondary | ICD-10-CM | POA: Insufficient documentation

## 2011-10-25 DIAGNOSIS — I509 Heart failure, unspecified: Secondary | ICD-10-CM | POA: Insufficient documentation

## 2011-10-25 DIAGNOSIS — I379 Nonrheumatic pulmonary valve disorder, unspecified: Secondary | ICD-10-CM | POA: Insufficient documentation

## 2011-10-25 NOTE — Progress Notes (Signed)
Echocardiogram performed.  

## 2011-11-08 ENCOUNTER — Ambulatory Visit: Payer: Self-pay | Admitting: Cardiology

## 2011-11-09 ENCOUNTER — Ambulatory Visit (INDEPENDENT_AMBULATORY_CARE_PROVIDER_SITE_OTHER): Payer: Medicaid Other | Admitting: Cardiology

## 2011-11-09 ENCOUNTER — Encounter: Payer: Self-pay | Admitting: Cardiology

## 2011-11-09 VITALS — BP 102/70 | HR 90 | Ht 69.0 in | Wt 226.0 lb

## 2011-11-09 DIAGNOSIS — I428 Other cardiomyopathies: Secondary | ICD-10-CM

## 2011-11-09 LAB — BASIC METABOLIC PANEL
CO2: 26 mEq/L (ref 19–32)
Calcium: 8.9 mg/dL (ref 8.4–10.5)
GFR: 79.18 mL/min (ref >60.00)
Glucose, Bld: 162 mg/dL — ABNORMAL HIGH (ref 70–99)
Potassium: 3.8 mEq/L (ref 3.5–5.1)
Sodium: 137 mEq/L (ref 135–145)

## 2011-11-09 MED ORDER — CARVEDILOL 12.5 MG PO TABS: ORAL_TABLET | ORAL | Status: DC

## 2011-11-09 NOTE — Assessment & Plan Note (Signed)
Blood pressure controlled. Increase Coreg for cardiomyopathy. 

## 2011-11-09 NOTE — Progress Notes (Signed)
   HPI: Pleasant female for fu of CM/CHF. Echocardiogram in April of 2013 showed an ejection fraction of 35%, moderate left ventricular enlargement, mild to moderate mitral regurgitation. HIV negative and TSH normal. Cardiac catheterization in April of 2013 showed normal coronary arteries and an ejection fraction of 35-40%. Patient was treated with medications and her cardiomyopathy was felt possibly secondary to hypertension. Echocardiogram repeated in September of 2013. Her ejection fraction was 30-35%. I last saw her in May of 2013. Since then, the patient denies any dyspnea on exertion, orthopnea, PND, pedal edema, palpitations, syncope or chest pain.   Current Outpatient Prescriptions  Medication Sig Dispense Refill  . carvedilol (COREG) 12.5 MG tablet Take 1 tablet (12.5 mg total) by mouth 2 (two) times daily with a meal.  60 tablet  12  . furosemide (LASIX) 40 MG tablet Take 1 tablet (40 mg total) by mouth daily.  30 tablet  6  . ibuprofen (ADVIL,MOTRIN) 200 MG tablet Take 400 mg by mouth every 6 (six) hours as needed. pain      . lisinopril (PRINIVIL,ZESTRIL) 40 MG tablet Take 1 tablet (40 mg total) by mouth daily.  30 tablet  6  . metFORMIN (GLUCOPHAGE) 500 MG tablet Take 500 mg by mouth 2 (two) times daily with a meal.      . potassium chloride SA (K-DUR,KLOR-CON) 20 MEQ tablet Take 1 tablet (20 mEq total) by mouth daily.  30 tablet  6     Past Medical History  Diagnosis Date  . Diabetes mellitus, type 2     A1C 7.6 April '13  . Hypertension   . Nonischemic cardiomyopathy     Echo 05/19/11 EF 35% w/ mild-mod MR; R/L Heart Cath 05/21/11 - mildly elevated right heart pressures, normal left heart pressures, preserved cardiac output, widely patent coronary arteries without significant CAD and moderate global systolic LV dysfunction, EF 35-40%.  . Systolic CHF     Echo 05/19/11 EF 35% w/ mild-mod MR    No past surgical history on file.  History   Social History  . Marital Status:  Divorced    Spouse Name: N/A    Number of Children: N/A  . Years of Education: N/A   Occupational History  . Not on file.   Social History Main Topics  . Smoking status: Never Smoker   . Smokeless tobacco: Not on file  . Alcohol Use: No  . Drug Use: No  . Sexually Active:    Other Topics Concern  . Not on file   Social History Narrative  . No narrative on file    ROS: no fevers or chills, productive cough, hemoptysis, dysphasia, odynophagia, melena, hematochezia, dysuria, hematuria, rash, seizure activity, orthopnea, PND, pedal edema, claudication. Remaining systems are negative.  Physical Exam: Well-developed well-nourished in no acute distress.  Skin is warm and dry.  HEENT is normal.  Neck is supple.  Chest is clear to auscultation with normal expansion.  Cardiovascular exam is regular rate and rhythm.  Abdominal exam nontender or distended. No masses palpated. Extremities show no edema. neuro grossly intact  ECG sinus rhythm at a rate of 90. Axis normal. Nonspecific T-wave changes. Prolonged QT.

## 2011-11-09 NOTE — Patient Instructions (Addendum)
Your physician recommends that you schedule a follow-up appointment in: 3 MONTHS WITH DR CRENSHAW  INCREASE CARVEDILOL TO 12.5 MG TAKE ONE AND ONE HALF TABLETS TWICE DAILY  Your physician recommends that you HAVE LAB WORK TODAY  REFERRAL TO EP TO DISCUSS ICD

## 2011-11-09 NOTE — Assessment & Plan Note (Addendum)
Etiology unclear but may be related to uncontrolled hypertension. TSH normal. No history of alcohol abuse. Cardiac catheterization showed no coronary disease. Increase carvedilol to 18.75 mg by mouth twice a day. Continue lisinopril 40 mg daily. Recent echocardiogram continues to show reduced LV function. Referred to electrophysiology for consideration of ICD. Benefits of ICD discussed in detail today in clinic.

## 2011-11-09 NOTE — Assessment & Plan Note (Signed)
Patient euvolemic on examination. Continue present dose of diuretic. Check potassium and renal function. 

## 2011-11-22 ENCOUNTER — Encounter: Payer: Self-pay | Admitting: *Deleted

## 2011-12-03 ENCOUNTER — Ambulatory Visit (INDEPENDENT_AMBULATORY_CARE_PROVIDER_SITE_OTHER): Payer: Medicaid Other | Admitting: Internal Medicine

## 2011-12-03 ENCOUNTER — Encounter: Payer: Self-pay | Admitting: Internal Medicine

## 2011-12-03 VITALS — BP 124/64 | HR 84 | Ht 69.0 in | Wt 221.0 lb

## 2011-12-03 DIAGNOSIS — I428 Other cardiomyopathies: Secondary | ICD-10-CM

## 2011-12-03 DIAGNOSIS — I10 Essential (primary) hypertension: Secondary | ICD-10-CM

## 2011-12-03 NOTE — Assessment & Plan Note (Signed)
At goal.  No changes today.  

## 2011-12-03 NOTE — Patient Instructions (Addendum)

## 2011-12-03 NOTE — Progress Notes (Signed)
Primary Care Physician: Jearld Lesch, MD Referring Physician:  Dr Chauncy Lean is a 46 y.o. female with a h/o nonischemic cardiomyopathy who presents today for EP consultation.  She reports initially being diagnosed with CHF after presenting with volume overload 4/13.  In retrospect, she has had difficulty with shortness of breath for about 2 years.  Her initial EF by echo 4/13 was 35%.  Cardiac catheterization in April of 2013 showed normal coronary arteries and an ejection fraction of 35-40%.  She has been treated aggressively with medical therapy by Dr Jens Som since that time.  Recently, she had an echo obtained 9/13 on optimal medical therapy which reveals EF to be 30-35%.   Presently, she reports SOB with moderate activity.  She feels that she is limited to 1-2 blocks with exertion.   Today, she denies symptoms of palpitations, chest pain, orthopnea, PND, lower extremity edema, dizziness, presyncope, syncope, or neurologic sequela. The patient is tolerating medications without difficulties and is otherwise without complaint today.   Past Medical History  Diagnosis Date  . Diabetes mellitus, type 2     A1C 7.6 April '13  . Hypertension   . Nonischemic cardiomyopathy     Echo 05/19/11 EF 35% w/ mild-mod MR; R/L Heart Cath 05/21/11 - mildly elevated right heart pressures, normal left heart pressures, preserved cardiac output, widely patent coronary arteries without significant CAD and moderate global systolic LV dysfunction, EF 35-40%.  . Systolic CHF     Echo 05/19/11 EF 35% w/ mild-mod MR   Past Surgical History  Procedure Date  . Tubal ligation     Current Outpatient Prescriptions  Medication Sig Dispense Refill  . carvedilol (COREG) 12.5 MG tablet TAKE ONE AND ONE HALF TABLETS TWICE DAILY  90 tablet  12  . furosemide (LASIX) 40 MG tablet Take 1 tablet (40 mg total) by mouth daily.  30 tablet  6  . ibuprofen (ADVIL,MOTRIN) 200 MG tablet Take 400 mg by mouth every 6  (six) hours as needed. pain      . lisinopril (PRINIVIL,ZESTRIL) 40 MG tablet Take 1 tablet (40 mg total) by mouth daily.  30 tablet  6  . metFORMIN (GLUCOPHAGE) 500 MG tablet Take 500 mg by mouth 2 (two) times daily with a meal.      . potassium chloride SA (K-DUR,KLOR-CON) 20 MEQ tablet Take 1 tablet (20 mEq total) by mouth daily.  30 tablet  6    No Known Allergies  History   Social History  . Marital Status: Divorced    Spouse Name: N/A    Number of Children: 2  . Years of Education: N/A   Occupational History  . Not on file.   Social History Main Topics  . Smoking status: Never Smoker   . Smokeless tobacco: Not on file  . Alcohol Use: No  . Drug Use: No  . Sexually Active:    Other Topics Concern  . Not on file   Social History Narrative   Lives in [place].  Presently unemployed but attends school full time    Family History  Problem Relation Age of Onset  . Diabetes      multiple  . Heart failure Paternal Grandmother   . Heart disease      multiple    ROS- All systems are reviewed and negative except as per the HPI above  Physical Exam: Filed Vitals:   12/03/11 1056  BP: 124/64  Pulse: 84  Height: 5\' 9"  (1.753  m)  Weight: 221 lb (100.245 kg)  SpO2: 99%    GEN- The patient is well appearing, alert and oriented x 3 today.   Head- normocephalic, atraumatic Eyes-  Sclera clear, conjunctiva pink Ears- hearing intact Oropharynx- clear Neck- supple, no JVP Lymph- no cervical lymphadenopathy Lungs- Clear to ausculation bilaterally, normal work of breathing Heart- Regular rate and rhythm, no murmurs, rubs or gallops, PMI not laterally displaced GI- soft, NT, ND, + BS Extremities- no clubbing, cyanosis, or edema MS- no significant deformity or atrophy Skin- no rash or lesion Psych- euthymic mood, full affect Neuro- strength and sensation are intact  EKG 11/09/11 reveals sinus rhythm 90 bpm, PR 152, QRS 70, Qtc 472 with nonspecific ST/T changes Echo  10/25/11 reveals EF 30% with LVEDD 53  Assessment and Plan:

## 2011-12-03 NOTE — Assessment & Plan Note (Signed)
The patient has a nonischemic CM (EF 30-35%), NYHA Class II/III CHF, and HTN.  She has been treated with an optimal medical regimen.   At this time, she meets SCD-HeFT criteria for ICD implantation for primary prevention of sudden death.  Risks, benefits, alternatives to ICD implantation were discussed in detail with the patient today.  At this time, she is very clear that she would like to contemplate this option further. She will consider ICD implantation and will contact my office if she decides to proceed.  She will continue to follow closely with Dr Jens Som in the interim.

## 2012-02-01 ENCOUNTER — Encounter: Payer: Medicaid Other | Admitting: Cardiology

## 2012-02-01 NOTE — Progress Notes (Signed)
   HPI: Pleasant female for fu of CM/CHF. Echocardiogram in April of 2013 showed an ejection fraction of 35%, moderate left ventricular enlargement, mild to moderate mitral regurgitation. HIV negative and TSH normal. Cardiac catheterization in April of 2013 showed normal coronary arteries and an ejection fraction of 35-40%. Patient was treated with medications and her cardiomyopathy was felt possibly secondary to hypertension. Repeat echocardiogram in September of 2013 showed an ejection fraction of 30-35% and diffuse hypokinesis. I last saw her in may of 2013. Since then,    Current Outpatient Prescriptions  Medication Sig Dispense Refill  . carvedilol (COREG) 12.5 MG tablet TAKE ONE AND ONE HALF TABLETS TWICE DAILY  90 tablet  12  . furosemide (LASIX) 40 MG tablet Take 1 tablet (40 mg total) by mouth daily.  30 tablet  6  . ibuprofen (ADVIL,MOTRIN) 200 MG tablet Take 400 mg by mouth every 6 (six) hours as needed. pain      . lisinopril (PRINIVIL,ZESTRIL) 40 MG tablet Take 1 tablet (40 mg total) by mouth daily.  30 tablet  6  . metFORMIN (GLUCOPHAGE) 500 MG tablet Take 500 mg by mouth 2 (two) times daily with a meal.      . potassium chloride SA (K-DUR,KLOR-CON) 20 MEQ tablet Take 1 tablet (20 mEq total) by mouth daily.  30 tablet  6     Past Medical History  Diagnosis Date  . Diabetes mellitus, type 2     A1C 7.6 April '13  . Hypertension   . Nonischemic cardiomyopathy     Echo 05/19/11 EF 35% w/ mild-mod MR; R/L Heart Cath 05/21/11 - mildly elevated right heart pressures, normal left heart pressures, preserved cardiac output, widely patent coronary arteries without significant CAD and moderate global systolic LV dysfunction, EF 35-40%.  . Systolic CHF     Echo 05/19/11 EF 35% w/ mild-mod MR    Past Surgical History  Procedure Date  . Tubal ligation     History   Social History  . Marital Status: Divorced    Spouse Name: N/A    Number of Children: 2  . Years of Education: N/A    Occupational History  . Not on file.   Social History Main Topics  . Smoking status: Never Smoker   . Smokeless tobacco: Not on file  . Alcohol Use: No  . Drug Use: No  . Sexually Active:    Other Topics Concern  . Not on file   Social History Narrative   Lives in [place].  Presently unemployed but attends school full time    ROS: no fevers or chills, productive cough, hemoptysis, dysphasia, odynophagia, melena, hematochezia, dysuria, hematuria, rash, seizure activity, orthopnea, PND, pedal edema, claudication. Remaining systems are negative.  Physical Exam: Well-developed well-nourished in no acute distress.  Skin is warm and dry.  HEENT is normal.  Neck is supple.  Chest is clear to auscultation with normal expansion.  Cardiovascular exam is regular rate and rhythm.  Abdominal exam nontender or distended. No masses palpated. Extremities show no edema. neuro grossly intact  ECG     This encounter was created in error - please disregard.

## 2012-08-27 ENCOUNTER — Encounter: Payer: BC Managed Care – PPO | Attending: Specialist | Admitting: *Deleted

## 2012-08-27 DIAGNOSIS — Z713 Dietary counseling and surveillance: Secondary | ICD-10-CM | POA: Insufficient documentation

## 2012-08-27 DIAGNOSIS — E119 Type 2 diabetes mellitus without complications: Secondary | ICD-10-CM

## 2012-08-27 NOTE — Patient Instructions (Addendum)
Call insurance to verify coverage for meter  Call MD to get prescription for strips and lancets.    Check glucose once a day:  One day check before breakfast; one day check 2 hour after breakfast; one day check 2 hours after lunch; one day check 2 hours after dinner.    Keep a log of your reading.  Get a note pad.  Write date and time and and "before meal" or "after meal" Goals: before meal: 90-120 mg/dl.  After meal: goal is less than 160 mg/dl   Empty Clorox bottle, or laundry detergent or fabric softener bottle.  Hard plastic.  Not see-through. Every time your check your glucose, change lancet.  Put used lancet in bottle.  When it's full , put the cap back on the bottle. Duct tape it closed and write "do not recycle" on the bottle.  Then throw it away.    Consider calling Dr. Elvera Lennox, endocrinologist.  [phone].  [address], [place]

## 2012-08-27 NOTE — Progress Notes (Signed)
Patient was seen on 08/27/12 for BGM instruction.  She was given a Metallurgist Lot: [id number] Expiration: 01-2014 Glucose reading: 347 mg/dl  RD has attempted to contact the referring provider to let him know about the elevated glucose reading.  Patient will be seen on 09/11/12 for Core class 1

## 2012-09-11 VITALS — Ht 69.0 in | Wt 226.4 lb

## 2012-09-11 DIAGNOSIS — E119 Type 2 diabetes mellitus without complications: Secondary | ICD-10-CM

## 2012-09-11 NOTE — Patient Instructions (Signed)
Goals:  Follow Diabetes Meal Plan as instructed  Eat 3 meals and 2 snacks, every 3-5 hrs  Limit carbohydrate intake to 30-45 grams carbohydrate/meal  Limit carbohydrate intake to 15 grams carbohydrate/snack  Add lean protein foods to meals/snacks  Monitor glucose levels as instructed by your doctor  Aim for 30 mins of physical activity daily  Bring food record and glucose log to your next nutrition visit 

## 2012-09-11 NOTE — Progress Notes (Signed)
Patient was seen on 09/11/12 for the first of a series of three diabetes self-management courses at the [facility].   Current HbA1c: 7.1%  The following learning objectives were met by the patient during this course:   Defines the role of glucose and insulin  Identifies type of diabetes and pathophysiology  Defines the diagnostic criteria for diabetes and prediabetes  States the risk factors for Type 2 Diabetes  States the symptoms of Type 2 Diabetes  Defines Type 2 Diabetes treatment goals  Defines Type 2 Diabetes treatment options  States the rationale for glucose monitoring  Identifies A1C, glucose targets, and testing times  Identifies proper sharps disposal  Defines the purpose of a diabetes food plan  Identifies carbohydrate food groups  Defines effects of carbohydrate foods on glucose levels  Identifies carbohydrate choices/grams/food labels  States benefits of physical activity and effect on glucose  Review of suggested activity guidelines  Handouts given during class include:  Type 2 Diabetes: Basics Book  My Food Plan Book  Food and Activity Log  Your patient has identified their diabetes self-care support plan as:  [facility] [place] [facility] support group  Follow-Up Plan: Attend core 2 and 3

## 2012-10-02 ENCOUNTER — Encounter: Payer: BC Managed Care – PPO | Attending: Specialist

## 2012-10-02 DIAGNOSIS — E119 Type 2 diabetes mellitus without complications: Secondary | ICD-10-CM | POA: Insufficient documentation

## 2012-10-02 DIAGNOSIS — Z713 Dietary counseling and surveillance: Secondary | ICD-10-CM | POA: Insufficient documentation

## 2012-10-02 NOTE — Progress Notes (Signed)
Patient was seen on 10/02/12 for the second of a series of three diabetes self-management courses at the [facility]. The following learning objectives were met by the patient during this course:   Explain basic nutrition maintenance and quality assurance  Describe causes, symptoms and treatment of hypoglycemia and hyperglycemia  Explain how to manage diabetes during illness  Describe the importance of good nutrition for health and healthy eating strategies  List strategies to follow meal plan when dining out  Describe the effects of alcohol on glucose and how to use it safely  Describe problem solving skills for day-to-day glucose challenges  Describe strategies to use when treatment plan needs to change  Identify important factors involved in successful weight loss  Describe ways to remain physically active  Describe the impact of regular activity on insulin resistance  Identify current diabetes medications, their action on blood glucose, and [pssible side effects.  Handouts given in class:  Refrigerator magnet for Sick Day Guidelines  [facility] Oral medication/insulin handout  Your patient has identified their diabetes self-care support plan as:  [facility] support group   Follow-Up Plan: Patient will attend the final class of the ADA Diabetes Self-Care Education.   

## 2012-10-10 DIAGNOSIS — E119 Type 2 diabetes mellitus without complications: Secondary | ICD-10-CM

## 2012-10-16 ENCOUNTER — Ambulatory Visit: Payer: BC Managed Care – PPO

## 2012-10-20 NOTE — Progress Notes (Signed)
Patient was seen on 10/10/12 for the third of a series of three diabetes self-management courses at the [facility]. The following learning objectives were met by the patient during this course:    Describe how diabetes changes over time   Identify diabetes complications and ways to prevent them   Describe strategies that can promote heart health including lowering blood pressure and cholesterol   Describe strategies to lower dietary fat and sodium in the diet   Identify physical activities that benefit cardiovascular health   Describe role of stress on blood glucose and develop strategies to address psychosocial issues   Evaluate success in meeting personal goal   Describe the belief that they can live successfully with diabetes day to day   Establish 2-3 goals that they will plan to diligently work on until they return for the free 99-month follow-up visit  The following handouts were given in class:  Goal setting handout  Class evaluation form  Low-sodium seasoning tips  Stress management handout  Your patient has established the following 4 month goals for diabetes self-care:  Continue counting carbs each meal  Increase to walking the stairs  No smoking ever  Your patient has identified these potential barriers to change:  none  Your patient has identified their diabetes self-care support plan as:  [facility] support group available   Follow-Up Plan: Patient was offered a 4 month follow-up visit for diabetes self-management education.

## 2012-10-24 ENCOUNTER — Telehealth: Payer: Self-pay | Admitting: Internal Medicine

## 2012-10-24 NOTE — Telephone Encounter (Signed)
New problem  Pt called stating that she was having chest pains// request to speak with a nurse.

## 2012-10-24 NOTE — Telephone Encounter (Signed)
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

## 2012-10-24 NOTE — Telephone Encounter (Signed)
Called stating that over the past month she has been having more SOB w/some chest discomfort even while resting. States when has discomfort she says she would rate pain as 6-7 but after she "relaxes" is gone after 15 min. States the SOB is similar to when she went to ER in April,2013. Per Dr. Jens Som will add to schedule on 9/18 at 9:45. Will notify pt.

## 2012-10-30 ENCOUNTER — Encounter: Payer: Self-pay | Admitting: *Deleted

## 2012-10-30 ENCOUNTER — Encounter: Payer: Self-pay | Admitting: Cardiology

## 2012-10-30 ENCOUNTER — Ambulatory Visit (INDEPENDENT_AMBULATORY_CARE_PROVIDER_SITE_OTHER): Payer: BC Managed Care – PPO | Admitting: Cardiology

## 2012-10-30 VITALS — BP 132/88 | HR 98 | Ht 69.0 in | Wt 229.0 lb

## 2012-10-30 DIAGNOSIS — R079 Chest pain, unspecified: Secondary | ICD-10-CM | POA: Insufficient documentation

## 2012-10-30 DIAGNOSIS — R0609 Other forms of dyspnea: Secondary | ICD-10-CM

## 2012-10-30 DIAGNOSIS — I428 Other cardiomyopathies: Secondary | ICD-10-CM

## 2012-10-30 DIAGNOSIS — I509 Heart failure, unspecified: Secondary | ICD-10-CM

## 2012-10-30 DIAGNOSIS — I5021 Acute systolic (congestive) heart failure: Secondary | ICD-10-CM

## 2012-10-30 DIAGNOSIS — I10 Essential (primary) hypertension: Secondary | ICD-10-CM

## 2012-10-30 LAB — BASIC METABOLIC PANEL
Chloride: 104 mEq/L (ref 96–112)
GFR: 83.74 mL/min (ref >60.00)
Potassium: 3.8 mEq/L (ref 3.5–5.1)
Sodium: 135 mEq/L (ref 135–145)

## 2012-10-30 MED ORDER — CARVEDILOL 25 MG PO TABS: 25.0000 mg | ORAL_TABLET | Freq: Two times a day (BID) | ORAL | Status: DC

## 2012-10-30 MED ORDER — SPIRONOLACTONE 25 MG PO TABS: 25.0000 mg | ORAL_TABLET | Freq: Every day | ORAL | Status: DC

## 2012-10-30 NOTE — Assessment & Plan Note (Signed)
Blood pressure mildly elevated. Increase Coreg to 25 mg by mouth twice a day. Add spironolactone 25 mg daily.

## 2012-10-30 NOTE — Assessment & Plan Note (Signed)
Patient is complaining of increased dyspnea. She is not taking her Lasix daily but averages 5 times weekly. I will increase to daily. She will take 40 mg twice a day for 2 days and then 40 mg daily thereafter. Add spironolactone 25 mg daily. Check renal function, potassium and BNP today. Repeat renal function and potassium in one week. Repeat echocardiogram. If ejection fraction less than 35% she would now consider ICD. I have instructed her on low sodium diet and fluid restriction.

## 2012-10-30 NOTE — Assessment & Plan Note (Signed)
Chest heaviness most likely related to CHF. Previous catheterization showed normal coronary arteries.

## 2012-10-30 NOTE — Assessment & Plan Note (Signed)
Continue ACE inhibitor. Increase carvedilol to 25 mg twice a day. Titrate medications as tolerated.

## 2012-10-30 NOTE — Progress Notes (Signed)
   HPI: Pleasant female for fu of CM/CHF. Echocardiogram in April of 2013 showed an ejection fraction of 35%, moderate left ventricular enlargement, mild to moderate mitral regurgitation. HIV negative and TSH normal. Cardiac catheterization in April of 2013 showed normal coronary arteries and an ejection fraction of 35-40%. Patient was treated with medications and her cardiomyopathy was felt possibly secondary to hypertension. Echocardiogram repeated in September of 2013. Her ejection fraction was 30-35%. Patient referred to Dr. Johney Frame for consideration of ICD but the patient decided not to pursue. I last saw her in September of 2013. Since then, over the past 3 months she has noticed increased dyspnea on exertion, orthopnea. No pedal edema. She has had chest heaviness continuously for one month that is not pleuritic.   Current Outpatient Prescriptions  Medication Sig Dispense Refill  . carvedilol (COREG) 12.5 MG tablet TAKE ONE AND ONE HALF TABLETS TWICE DAILY  90 tablet  12  . furosemide (LASIX) 40 MG tablet Take 1 tablet (40 mg total) by mouth daily.  30 tablet  6  . ibuprofen (ADVIL,MOTRIN) 200 MG tablet Take 400 mg by mouth every 6 (six) hours as needed. pain      . lisinopril (PRINIVIL,ZESTRIL) 40 MG tablet Take 1 tablet (40 mg total) by mouth daily.  30 tablet  6  . metFORMIN (GLUCOPHAGE) 500 MG tablet Take 500 mg by mouth 2 (two) times daily with a meal.      . potassium chloride SA (K-DUR,KLOR-CON) 20 MEQ tablet Take 1 tablet (20 mEq total) by mouth daily.  30 tablet  6   No current facility-administered medications for this visit.     Past Medical History  Diagnosis Date  . Diabetes mellitus, type 2     A1C 7.6 April '13  . Hypertension   . Nonischemic cardiomyopathy     Echo 05/19/11 EF 35% w/ mild-mod MR; R/L Heart Cath 05/21/11 - mildly elevated right heart pressures, normal left heart pressures, preserved cardiac output, widely patent coronary arteries without significant CAD and  moderate global systolic LV dysfunction, EF 35-40%.  . Systolic CHF     Echo 05/19/11 EF 35% w/ mild-mod MR    Past Surgical History  Procedure Laterality Date  . Tubal ligation      History   Social History  . Marital Status: Divorced    Spouse Name: N/A    Number of Children: 2  . Years of Education: N/A   Occupational History  . Not on file.   Social History Main Topics  . Smoking status: Never Smoker   . Smokeless tobacco: Not on file  . Alcohol Use: No  . Drug Use: No  . Sexual Activity:    Other Topics Concern  . Not on file   Social History Narrative   Lives in [place].  Presently unemployed but attends school full time    ROS: no fevers or chills, productive cough, hemoptysis, dysphasia, odynophagia, melena, hematochezia, dysuria, hematuria, rash, seizure activity, orthopnea, PND, pedal edema, claudication. Remaining systems are negative.  Physical Exam: Well-developed well-nourished in no acute distress.  Skin is warm and dry.  HEENT is normal.  Neck is supple.  Chest is clear to auscultation with normal expansion.  Cardiovascular exam is regular rate and rhythm.  Abdominal exam nontender or distended. No masses palpated. Extremities show no edema. neuro grossly intact  ECG sinus rhythm at a rate of 95. Nonspecific ST changes. Prolonged QT interval.

## 2012-10-30 NOTE — Patient Instructions (Addendum)
.  Your physician recommends that you schedule a follow-up appointment in: 4 WEEKS WITH DR CRENSHAW  INCREASE CARVEDILOL TO 25 MG TWICE DAILY  INCREASE FUROSEMIDE TO 40 MG TWICE DAILY X 2 DAYS THEN REDUCE BACK TO 40 MG ONCE DAILY  START SPIRONOLACTONE 25 MG ONCE DAILY  Your physician recommends that you HAVE LAB WORK TODAY AND IN ONE WEEK  Your physician has requested that you have an echocardiogram. Echocardiography is a painless test that uses sound waves to create images of your heart. It provides your doctor with information about the size and shape of your heart and how well your heart's chambers and valves are working. This procedure takes approximately one hour. There are no restrictions for this procedure.

## 2012-11-10 ENCOUNTER — Ambulatory Visit: Payer: BC Managed Care – PPO | Admitting: Internal Medicine

## 2012-11-11 ENCOUNTER — Other Ambulatory Visit: Payer: BC Managed Care – PPO

## 2012-11-11 ENCOUNTER — Ambulatory Visit (HOSPITAL_COMMUNITY): Payer: BC Managed Care – PPO | Attending: Cardiology | Admitting: Radiology

## 2012-11-11 DIAGNOSIS — I379 Nonrheumatic pulmonary valve disorder, unspecified: Secondary | ICD-10-CM | POA: Insufficient documentation

## 2012-11-11 DIAGNOSIS — I10 Essential (primary) hypertension: Secondary | ICD-10-CM

## 2012-11-11 DIAGNOSIS — I428 Other cardiomyopathies: Secondary | ICD-10-CM | POA: Insufficient documentation

## 2012-11-11 DIAGNOSIS — R079 Chest pain, unspecified: Secondary | ICD-10-CM

## 2012-11-11 DIAGNOSIS — I5021 Acute systolic (congestive) heart failure: Secondary | ICD-10-CM

## 2012-11-11 DIAGNOSIS — R072 Precordial pain: Secondary | ICD-10-CM

## 2012-11-11 DIAGNOSIS — R0602 Shortness of breath: Secondary | ICD-10-CM | POA: Insufficient documentation

## 2012-11-11 DIAGNOSIS — I509 Heart failure, unspecified: Secondary | ICD-10-CM | POA: Insufficient documentation

## 2012-11-11 NOTE — Progress Notes (Signed)
Echocardiogram performed.  

## 2012-11-12 ENCOUNTER — Ambulatory Visit: Payer: BC Managed Care – PPO | Admitting: Internal Medicine

## 2012-11-28 ENCOUNTER — Ambulatory Visit: Payer: BC Managed Care – PPO | Admitting: Cardiology

## 2013-02-13 ENCOUNTER — Ambulatory Visit: Payer: BC Managed Care – PPO | Admitting: *Deleted

## 2013-10-15 ENCOUNTER — Encounter: Payer: Self-pay | Admitting: *Deleted

## 2014-01-21 ENCOUNTER — Encounter (HOSPITAL_COMMUNITY): Payer: Self-pay | Admitting: Cardiovascular Disease

## 2014-04-07 ENCOUNTER — Emergency Department (HOSPITAL_COMMUNITY): Payer: Self-pay

## 2014-04-07 ENCOUNTER — Encounter (HOSPITAL_COMMUNITY): Payer: Self-pay | Admitting: Emergency Medicine

## 2014-04-07 ENCOUNTER — Emergency Department (HOSPITAL_COMMUNITY)
Admission: EM | Admit: 2014-04-07 | Discharge: 2014-04-07 | Disposition: A | Discharge status: Home / Self Care | Payer: Self-pay | Attending: Emergency Medicine | Admitting: Emergency Medicine

## 2014-04-07 DIAGNOSIS — I5020 Unspecified systolic (congestive) heart failure: Secondary | ICD-10-CM | POA: Insufficient documentation

## 2014-04-07 DIAGNOSIS — I10 Essential (primary) hypertension: Secondary | ICD-10-CM | POA: Insufficient documentation

## 2014-04-07 DIAGNOSIS — R2242 Localized swelling, mass and lump, left lower limb: Secondary | ICD-10-CM | POA: Insufficient documentation

## 2014-04-07 DIAGNOSIS — J9801 Acute bronchospasm: Secondary | ICD-10-CM | POA: Insufficient documentation

## 2014-04-07 DIAGNOSIS — E119 Type 2 diabetes mellitus without complications: Secondary | ICD-10-CM | POA: Insufficient documentation

## 2014-04-07 DIAGNOSIS — Z9889 Other specified postprocedural states: Secondary | ICD-10-CM | POA: Insufficient documentation

## 2014-04-07 DIAGNOSIS — Z79899 Other long term (current) drug therapy: Secondary | ICD-10-CM | POA: Insufficient documentation

## 2014-04-07 LAB — COMPREHENSIVE METABOLIC PANEL
ALBUMIN: 3.3 g/dL — AB (ref 3.5–5.2)
ALK PHOS: 97 U/L (ref 39–117)
ALT: 21 U/L (ref 0–35)
AST: 21 U/L (ref 0–37)
Anion gap: 8 (ref 5–15)
BILIRUBIN TOTAL: 0.6 mg/dL (ref 0.3–1.2)
BUN: 10 mg/dL (ref 6–23)
CO2: 24 mmol/L (ref 19–32)
Calcium: 8.6 mg/dL (ref 8.4–10.5)
Chloride: 101 mmol/L (ref 96–112)
Creatinine, Ser: 0.72 mg/dL (ref 0.50–1.10)
GFR calc Af Amer: >90 mL/min (ref >90)
Glucose, Bld: 417 mg/dL — ABNORMAL HIGH (ref 70–99)
POTASSIUM: 3.5 mmol/L (ref 3.5–5.1)
Sodium: 133 mmol/L — ABNORMAL LOW (ref 135–145)
Total Protein: 6.4 g/dL (ref 6.0–8.3)

## 2014-04-07 LAB — CBC WITH DIFFERENTIAL/PLATELET
BASOS PCT: 0 % (ref 0–1)
Basophils Absolute: 0 10*3/uL (ref 0.0–0.1)
EOS ABS: 0.1 10*3/uL (ref 0.0–0.7)
EOS PCT: 2 % (ref 0–5)
HEMATOCRIT: 40 % (ref 36.0–46.0)
Hemoglobin: 13.4 g/dL (ref 12.0–15.0)
Lymphocytes Relative: 42 % (ref 12–46)
Lymphs Abs: 3.6 10*3/uL (ref 0.7–4.0)
MCH: 29.3 pg (ref 26.0–34.0)
MCHC: 33.5 g/dL (ref 30.0–36.0)
MCV: 87.5 fL (ref 78.0–100.0)
MONO ABS: 0.2 10*3/uL (ref 0.1–1.0)
MONOS PCT: 2 % — AB (ref 3–12)
Neutro Abs: 4.6 10*3/uL (ref 1.7–7.7)
Neutrophils Relative %: 54 % (ref 43–77)
Platelets: 328 10*3/uL (ref 150–400)
RBC: 4.57 MIL/uL (ref 3.87–5.11)
RDW: 13.2 % (ref 11.5–15.5)
WBC: 8.5 10*3/uL (ref 4.0–10.5)

## 2014-04-07 LAB — TROPONIN I

## 2014-04-07 LAB — BRAIN NATRIURETIC PEPTIDE: B Natriuretic Peptide: 47.5 pg/mL (ref 0.0–100.0)

## 2014-04-07 MED ORDER — IPRATROPIUM BROMIDE 0.02 % IN SOLN
0.5000 mg | Freq: Once | RESPIRATORY_TRACT | Status: AC
  Administered 2014-04-07: 0.5 mg via RESPIRATORY_TRACT
  Filled 2014-04-07: qty 2.5

## 2014-04-07 MED ORDER — ALBUTEROL SULFATE HFA 108 (90 BASE) MCG/ACT IN AERS
2.0000 | INHALATION_SPRAY | RESPIRATORY_TRACT | Status: DC | PRN
  Administered 2014-04-07: 2 via RESPIRATORY_TRACT
  Filled 2014-04-07: qty 6.7

## 2014-04-07 MED ORDER — FUROSEMIDE 10 MG/ML IJ SOLN
60.0000 mg | Freq: Once | INTRAMUSCULAR | Status: AC
  Administered 2014-04-07: 60 mg via INTRAVENOUS
  Filled 2014-04-07: qty 8

## 2014-04-07 MED ORDER — NITROGLYCERIN 2 % TD OINT
1.0000 [in_us] | TOPICAL_OINTMENT | Freq: Once | TRANSDERMAL | Status: AC
  Administered 2014-04-07: 1 [in_us] via TOPICAL
  Filled 2014-04-07: qty 30

## 2014-04-07 MED ORDER — ALBUTEROL SULFATE (2.5 MG/3ML) 0.083% IN NEBU
5.0000 mg | INHALATION_SOLUTION | Freq: Once | RESPIRATORY_TRACT | Status: AC
  Administered 2014-04-07: 5 mg via RESPIRATORY_TRACT
  Filled 2014-04-07: qty 6

## 2014-04-07 MED ORDER — AEROCHAMBER Z-STAT PLUS/MEDIUM MISC
1.0000 | Freq: Once | Status: AC
  Administered 2014-04-07: 1
  Filled 2014-04-07: qty 1

## 2014-04-07 NOTE — ED Notes (Signed)
Pt reports having difficulty catching her breath for the past hour. Pt denies hx of asthma. Pt alert and oriented.

## 2014-04-07 NOTE — ED Provider Notes (Signed)
CSN: [id number]     Arrival date & time 04/07/14  0004 History   First MD Initiated Contact with Patient 04/07/14 [phone]     Chief Complaint  Patient presents with  . Shortness of Breath     (Consider location/radiation/quality/duration/timing/severity/associated sxs/prior Treatment) HPI  Patient states she was fine all day today. However about 10 PM this evening she had acute onset of shortness of breath and wheezing. She states when she coughed her mucus which was clear had streaks of blood in it. She also complains of swelling in her left foot and ankle for the past few weeks. She denies any abdominal swelling. She also states she's been having tingling in her left fingers off and on for the past month. It involves all the fingers. She states she has had a mild cough. She denies any fever. She states she's uncomfortable in her upper chest when she coughs. She denies nausea, vomiting, diaphoresis.  Patient states she was admitted in 2013 and was in the hospital for about a week. At that time she was diagnosed with cardiomyopathy and congestive heart failure. She states she was dismissed by the cardiologist and currently just sees her primary care doctor.  PCP Dr Caroline Sauger   Past Medical History  Diagnosis Date  . Diabetes mellitus, type 2     A1C 7.6 April '13  . Hypertension   . Nonischemic cardiomyopathy     Echo 05/19/11 EF 35% w/ mild-mod MR; R/L Heart Cath 05/21/11 - mildly elevated right heart pressures, normal left heart pressures, preserved cardiac output, widely patent coronary arteries without significant CAD and moderate global systolic LV dysfunction, EF 35-40%.  . Systolic CHF     Echo 05/19/11 EF 35% w/ mild-mod MR   Past Surgical History  Procedure Laterality Date  . Tubal ligation  1996  . Dilation and curettage of uterus    . Cervix lesion destruction    . Left heart catheterization with coronary angiogram N/A 05/21/2011    Procedure: LEFT HEART CATHETERIZATION WITH  CORONARY ANGIOGRAM;  Surgeon: Tonny Bollman, MD;  Location: [facility] CATH LAB;  Service: Cardiovascular;  Laterality: N/A;   Family History  Problem Relation Age of Onset  . Diabetes      multiple  . Heart failure Paternal Grandmother   . Heart disease      multiple   History  Substance Use Topics  . Smoking status: Never Smoker   . Smokeless tobacco: Not on file  . Alcohol Use: No   employed  OB History    No data available     Review of Systems  All other systems reviewed and are negative.     Allergies  Review of patient's allergies indicates no known allergies.  Home Medications   Prior to Admission medications   Medication Sig Start Date End Date Taking? Authorizing Provider  furosemide (LASIX) 20 MG tablet Take 20 mg by mouth daily.   Yes Historical Provider, MD  ibuprofen (ADVIL,MOTRIN) 200 MG tablet Take 400 mg by mouth every 4 (four) hours as needed (for pain/numbness/tingling). pain   Yes Historical Provider, MD  metFORMIN (GLUCOPHAGE) 1000 MG tablet Take 1,000 mg by mouth daily.   Yes Historical Provider, MD  metoprolol (LOPRESSOR) 50 MG tablet Take 50 mg by mouth daily.   Yes Historical Provider, MD  carvedilol (COREG) 25 MG tablet Take 1 tablet (25 mg total) by mouth 2 (two) times daily with a meal. Patient not taking: Reported on 04/07/2014 10/30/12  Lewayne Bunting, MD  furosemide (LASIX) 40 MG tablet Take 1 tablet (40 mg total) by mouth daily. Patient not taking: Reported on 04/07/2014 08/14/11 10/30/12  Lewayne Bunting, MD  lisinopril (PRINIVIL,ZESTRIL) 40 MG tablet Take 1 tablet (40 mg total) by mouth daily. Patient not taking: Reported on 04/07/2014 08/14/11 10/30/12  Lewayne Bunting, MD  potassium chloride SA (K-DUR,KLOR-CON) 20 MEQ tablet Take 1 tablet (20 mEq total) by mouth daily. Patient not taking: Reported on 04/07/2014 08/14/11 10/30/12  Lewayne Bunting, MD  spironolactone (ALDACTONE) 25 MG tablet Take 1 tablet (25 mg total) by mouth daily. Patient not  taking: Reported on 04/07/2014 10/30/12   Lewayne Bunting, MD   BP 151/115 mmHg  Pulse 98  Temp(Src) 98.1 F (36.7 C) (Oral)  Resp 16  SpO2 95%  LMP 03/19/2014  Vital signs normal except for hypertension    Physical Exam  Constitutional: She is oriented to person, place, and time. She appears well-developed and well-nourished.  Non-toxic appearance. She does not appear ill. No distress.  HENT:  Head: Normocephalic and atraumatic.  Right Ear: External ear normal.  Left Ear: External ear normal.  Nose: Nose normal. No mucosal edema or rhinorrhea.  Mouth/Throat: Oropharynx is clear and moist and mucous membranes are normal. No dental abscesses or uvula swelling.  Eyes: Conjunctivae and EOM are normal. Pupils are equal, round, and reactive to light.  Neck: Normal range of motion and full passive range of motion without pain. Neck supple.  Cardiovascular: Normal rate, regular rhythm and normal heart sounds.  Exam reveals no gallop and no friction rub.   No murmur heard. Pulmonary/Chest: Effort normal. No respiratory distress. She has decreased breath sounds. She has wheezes. She has no rhonchi. She has no rales. She exhibits no tenderness and no crepitus.  Patient has some scattered wheezing sometimes audible  Abdominal: Soft. Normal appearance and bowel sounds are normal. She exhibits no distension. There is no tenderness. There is no rebound and no guarding.  Musculoskeletal: Normal range of motion. She exhibits edema. She exhibits no tenderness.  Moves all extremities well. Patient has diffuse swelling of her left lower leg about 1+ around the ankle.  Neurological: She is alert and oriented to person, place, and time. She has normal strength. No cranial nerve deficit.  Skin: Skin is warm, dry and intact. No rash noted. No erythema. No pallor.  Psychiatric: She has a normal mood and affect. Her speech is normal and behavior is normal. Her mood appears not anxious.  Nursing note and  vitals reviewed.   ED Course  Procedures (including critical care time)  Medications  albuterol (PROVENTIL HFA;VENTOLIN HFA) 108 (90 BASE) MCG/ACT inhaler 2 puff (not administered)  aerochamber Z-Stat Plus/medium 1 each (not administered)  furosemide (LASIX) injection 60 mg (60 mg Intravenous Given 04/07/14 0428)  nitroGLYCERIN (NITROGLYN) 2 % ointment 1 inch (1 inch Topical Given 04/07/14 0428)  albuterol (PROVENTIL) (2.5 MG/3ML) 0.083% nebulizer solution 5 mg (5 mg Nebulization Given 04/07/14 0437)  ipratropium (ATROVENT) nebulizer solution 0.5 mg (0.5 mg Nebulization Given 04/07/14 0437)    Patient ambulated to the bathroom without worsening of her shortness of breath. She states she feels improved after the albuterol nebulizer. On reexam she no longer appears to be short of breath. She has no wheezing or diminished breath sounds. We discussed getting a Doppler ultrasound of her left leg because of the swelling however she states it is been going that for several years and she is not  concerned about it. She does not want to wait and have any other testing done on that. Patient was given an albuterol inhaler and a spacer to use for her bronchospasm. She was instructed on how to use it. Patient most likely has a viral bronchitis. At this point antibiotics were not started.  Labs Review Results for orders placed or performed during the hospital encounter of 04/07/14  Comprehensive metabolic panel  Result Value Ref Range   Sodium 133 (L) 135 - 145 mmol/L   Potassium 3.5 3.5 - 5.1 mmol/L   Chloride 101 96 - 112 mmol/L   CO2 24 19 - 32 mmol/L   Glucose, Bld 417 (H) 70 - 99 mg/dL   BUN 10 6 - 23 mg/dL   Creatinine, Ser 4.54 0.50 - 1.10 mg/dL   Calcium 8.6 8.4 - 09.8 mg/dL   Total Protein 6.4 6.0 - 8.3 g/dL   Albumin 3.3 (L) 3.5 - 5.2 g/dL   AST 21 0 - 37 U/L   ALT 21 0 - 35 U/L   Alkaline Phosphatase 97 39 - 117 U/L   Total Bilirubin 0.6 0.3 - 1.2 mg/dL   GFR calc non Af Amer >90 >90  mL/min   GFR calc Af Amer >90 >90 mL/min   Anion gap 8 5 - 15  Brain natriuretic peptide  Result Value Ref Range   B Natriuretic Peptide 47.5 0.0 - 100.0 pg/mL  Troponin I  Result Value Ref Range   Troponin I <0.03 <0.031 ng/mL  CBC with Differential  Result Value Ref Range   WBC 8.5 4.0 - 10.5 K/uL   RBC 4.57 3.87 - 5.11 MIL/uL   Hemoglobin 13.4 12.0 - 15.0 g/dL   HCT 11.9 14.7 - 82.9 %   MCV 87.5 78.0 - 100.0 fL   MCH 29.3 26.0 - 34.0 pg   MCHC 33.5 30.0 - 36.0 g/dL   RDW 56.2 13.0 - 86.5 %   Platelets 328 150 - 400 K/uL   Neutrophils Relative % 54 43 - 77 %   Neutro Abs 4.6 1.7 - 7.7 K/uL   Lymphocytes Relative 42 12 - 46 %   Lymphs Abs 3.6 0.7 - 4.0 K/uL   Monocytes Relative 2 (L) 3 - 12 %   Monocytes Absolute 0.2 0.1 - 1.0 K/uL   Eosinophils Relative 2 0 - 5 %   Eosinophils Absolute 0.1 0.0 - 0.7 K/uL   Basophils Relative 0 0 - 1 %   Basophils Absolute 0.0 0.0 - 0.1 K/uL   Laboratory interpretation all normal     Imaging Review Dg Chest 2 View  04/07/2014   CLINICAL DATA:  Shortness of breath for 1 hour.  EXAM: CHEST  2 VIEW  COMPARISON:  Chest radiograph May 21, 2011  FINDINGS: Cardiomediastinal silhouette is unremarkable. Mild bronchitic changes. The lungs are clear without pleural effusions or focal consolidations. Trachea projects midline and there is no pneumothorax. Soft tissue planes and included osseous structures are non-suspicious.  IMPRESSION: Mild bronchitic changes without focal consolidation.   Electronically Signed   By: Awilda Metro   On: 04/07/2014 04:22     EKG Interpretation   Date/Time:  Wednesday April 07 2014 04:10:33 EST Ventricular Rate:  92 PR Interval:  162 QRS Duration: 84 QT Interval:  397 QTC Calculation: 491 R Axis:   51 Text Interpretation:  Sinus rhythm Abnormal R-wave progression, early  transition Borderline T wave abnormalities Borderline prolonged QT  interval No significant change since last tracing 18 May 2011  Confirmed by  [facility]  MD-I, Jaqwon Manfred ([id number]) on 04/07/2014 4:15:48 AM      MDM   Final diagnoses:  Bronchospasm, acute   Plan discharge  Devoria Albe, MD, Franz Dell, MD 04/07/14 [phone]

## 2014-04-07 NOTE — Discharge Instructions (Signed)
Use the inhaler with the spacer for wheezing or shortness of breath. Return to the ED if you get a fever, struggle to breathe despite using the inhaler, you start coughing up yellow or green mucus or cough up just pure blood.

## 2015-02-13 DIAGNOSIS — I639 Cerebral infarction, unspecified: Secondary | ICD-10-CM

## 2015-02-13 HISTORY — DX: Cerebral infarction, unspecified: I63.9

## 2015-02-23 ENCOUNTER — Encounter (HOSPITAL_COMMUNITY): Payer: Self-pay | Admitting: Emergency Medicine

## 2015-02-23 ENCOUNTER — Inpatient Hospital Stay (HOSPITAL_COMMUNITY)
Admission: EM | Admit: 2015-02-23 | Discharge: 2015-02-26 | DRG: 064 | Disposition: A | Discharge status: Home / Self Care | Payer: Medicaid Other | Attending: Family Medicine | Admitting: Family Medicine

## 2015-02-23 ENCOUNTER — Emergency Department (HOSPITAL_COMMUNITY): Payer: Medicaid Other

## 2015-02-23 DIAGNOSIS — I110 Hypertensive heart disease with heart failure: Secondary | ICD-10-CM | POA: Diagnosis present

## 2015-02-23 DIAGNOSIS — I5021 Acute systolic (congestive) heart failure: Secondary | ICD-10-CM | POA: Diagnosis present

## 2015-02-23 DIAGNOSIS — E785 Hyperlipidemia, unspecified: Secondary | ICD-10-CM | POA: Diagnosis present

## 2015-02-23 DIAGNOSIS — I7771 Dissection of carotid artery: Secondary | ICD-10-CM | POA: Diagnosis present

## 2015-02-23 DIAGNOSIS — Z7984 Long term (current) use of oral hypoglycemic drugs: Secondary | ICD-10-CM | POA: Diagnosis not present

## 2015-02-23 DIAGNOSIS — E873 Alkalosis: Secondary | ICD-10-CM | POA: Diagnosis present

## 2015-02-23 DIAGNOSIS — R4701 Aphasia: Secondary | ICD-10-CM | POA: Diagnosis present

## 2015-02-23 DIAGNOSIS — I63512 Cerebral infarction due to unspecified occlusion or stenosis of left middle cerebral artery: Principal | ICD-10-CM | POA: Diagnosis present

## 2015-02-23 DIAGNOSIS — I10 Essential (primary) hypertension: Secondary | ICD-10-CM

## 2015-02-23 DIAGNOSIS — E1165 Type 2 diabetes mellitus with hyperglycemia: Secondary | ICD-10-CM | POA: Diagnosis not present

## 2015-02-23 DIAGNOSIS — G9340 Encephalopathy, unspecified: Secondary | ICD-10-CM | POA: Diagnosis present

## 2015-02-23 DIAGNOSIS — Z79899 Other long term (current) drug therapy: Secondary | ICD-10-CM | POA: Diagnosis not present

## 2015-02-23 DIAGNOSIS — E669 Obesity, unspecified: Secondary | ICD-10-CM | POA: Diagnosis present

## 2015-02-23 DIAGNOSIS — R079 Chest pain, unspecified: Secondary | ICD-10-CM

## 2015-02-23 DIAGNOSIS — I639 Cerebral infarction, unspecified: Secondary | ICD-10-CM

## 2015-02-23 DIAGNOSIS — E118 Type 2 diabetes mellitus with unspecified complications: Secondary | ICD-10-CM

## 2015-02-23 DIAGNOSIS — I428 Other cardiomyopathies: Secondary | ICD-10-CM | POA: Diagnosis not present

## 2015-02-23 DIAGNOSIS — I5042 Chronic combined systolic (congestive) and diastolic (congestive) heart failure: Secondary | ICD-10-CM | POA: Diagnosis not present

## 2015-02-23 DIAGNOSIS — R4182 Altered mental status, unspecified: Secondary | ICD-10-CM | POA: Diagnosis present

## 2015-02-23 DIAGNOSIS — Z6836 Body mass index (BMI) 36.0-36.9, adult: Secondary | ICD-10-CM

## 2015-02-23 DIAGNOSIS — I429 Cardiomyopathy, unspecified: Secondary | ICD-10-CM

## 2015-02-23 DIAGNOSIS — Z66 Do not resuscitate: Secondary | ICD-10-CM | POA: Diagnosis not present

## 2015-02-23 DIAGNOSIS — I6330 Cerebral infarction due to thrombosis of unspecified cerebral artery: Secondary | ICD-10-CM | POA: Insufficient documentation

## 2015-02-23 LAB — BLOOD GAS, ARTERIAL
ACID-BASE DEFICIT: 1.1 mmol/L (ref 0.0–2.0)
BICARBONATE: 22.2 meq/L (ref 20.0–24.0)
DRAWN BY: 270271
FIO2: 0.21
O2 SAT: 96.4 %
PATIENT TEMPERATURE: 98.6
PH ART: 7.462 — AB (ref 7.350–7.450)
TCO2: 23.1 mmol/L (ref 0–100)
pCO2 arterial: 31.5 mmHg — ABNORMAL LOW (ref 35.0–45.0)
pO2, Arterial: 79.5 mmHg — ABNORMAL LOW (ref 80.0–100.0)

## 2015-02-23 LAB — SALICYLATE LEVEL

## 2015-02-23 LAB — CARBOXYHEMOGLOBIN
CARBOXYHEMOGLOBIN: 1.7 % — AB (ref 0.5–1.5)
Methemoglobin: 0.6 % (ref 0.0–1.5)
O2 SAT: 96.5 %
Total hemoglobin: 13.5 g/dL (ref 12.0–16.0)

## 2015-02-23 LAB — TROPONIN I

## 2015-02-23 LAB — COMPREHENSIVE METABOLIC PANEL
ALT: 30 U/L (ref 14–54)
AST: 27 U/L (ref 15–41)
Albumin: 4.1 g/dL (ref 3.5–5.0)
Alkaline Phosphatase: 122 U/L (ref 38–126)
Anion gap: 14 (ref 5–15)
BUN: 9 mg/dL (ref 6–20)
CHLORIDE: 97 mmol/L — AB (ref 101–111)
CO2: 22 mmol/L (ref 22–32)
CREATININE: 0.76 mg/dL (ref 0.44–1.00)
Calcium: 9.9 mg/dL (ref 8.9–10.3)
GFR calc Af Amer: >60 mL/min (ref >60)
Glucose, Bld: 366 mg/dL — ABNORMAL HIGH (ref 65–99)
POTASSIUM: 4 mmol/L (ref 3.5–5.1)
SODIUM: 133 mmol/L — AB (ref 135–145)
Total Bilirubin: 0.9 mg/dL (ref 0.3–1.2)
Total Protein: 8.3 g/dL — ABNORMAL HIGH (ref 6.5–8.1)

## 2015-02-23 LAB — RAPID URINE DRUG SCREEN, HOSP PERFORMED
AMPHETAMINES: NOT DETECTED
BENZODIAZEPINES: NOT DETECTED
Barbiturates: NOT DETECTED
COCAINE: NOT DETECTED
Opiates: NOT DETECTED
Tetrahydrocannabinol: NOT DETECTED

## 2015-02-23 LAB — CBC
HEMATOCRIT: 47.8 % — AB (ref 36.0–46.0)
HEMATOCRIT: 42.1 % (ref 36.0–46.0)
HEMOGLOBIN: 14.2 g/dL (ref 12.0–15.0)
Hemoglobin: 16.3 g/dL — ABNORMAL HIGH (ref 12.0–15.0)
MCH: 29.5 pg (ref 26.0–34.0)
MCH: 29.1 pg (ref 26.0–34.0)
MCHC: 34.1 g/dL (ref 30.0–36.0)
MCHC: 33.7 g/dL (ref 30.0–36.0)
MCV: 86.4 fL (ref 78.0–100.0)
MCV: 86.3 fL (ref 78.0–100.0)
Platelets: 329 10*3/uL (ref 150–400)
Platelets: 348 10*3/uL (ref 150–400)
RBC: 5.53 MIL/uL — ABNORMAL HIGH (ref 3.87–5.11)
RBC: 4.88 MIL/uL (ref 3.87–5.11)
RDW: 13.3 % (ref 11.5–15.5)
RDW: 13.3 % (ref 11.5–15.5)
WBC: 8.4 10*3/uL (ref 4.0–10.5)
WBC: 8.2 10*3/uL (ref 4.0–10.5)

## 2015-02-23 LAB — CREATININE, SERUM
CREATININE: 0.72 mg/dL (ref 0.44–1.00)
GFR calc Af Amer: >60 mL/min (ref >60)

## 2015-02-23 LAB — TSH: TSH: 1.223 u[IU]/mL (ref 0.350–4.500)

## 2015-02-23 LAB — POC URINE PREG, ED: Preg Test, Ur: NEGATIVE

## 2015-02-23 LAB — MAGNESIUM: Magnesium: 1.5 mg/dL — ABNORMAL LOW (ref 1.7–2.4)

## 2015-02-23 LAB — PROTIME-INR
INR: 0.98 (ref 0.00–1.49)
Prothrombin Time: 13.2 seconds (ref 11.6–15.2)

## 2015-02-23 LAB — CBG MONITORING, ED: GLUCOSE-CAPILLARY: 326 mg/dL — AB (ref 65–99)

## 2015-02-23 LAB — ACETAMINOPHEN LEVEL

## 2015-02-23 LAB — GLUCOSE, CAPILLARY: Glucose-Capillary: 306 mg/dL — ABNORMAL HIGH (ref 65–99)

## 2015-02-23 LAB — I-STAT TROPONIN, ED: TROPONIN I, POC: 0.01 ng/mL (ref 0.00–0.08)

## 2015-02-23 LAB — AMMONIA: Ammonia: 17 umol/L (ref 9–35)

## 2015-02-23 MED ORDER — SODIUM CHLORIDE 0.9 % IV SOLN
INTRAVENOUS | Status: AC
  Administered 2015-02-23: 100 mL/h via INTRAVENOUS

## 2015-02-23 MED ORDER — INSULIN ASPART 100 UNIT/ML ~~LOC~~ SOLN
0.0000 [IU] | Freq: Every day | SUBCUTANEOUS | Status: DC
  Administered 2015-02-23: 4 [IU] via SUBCUTANEOUS
  Administered 2015-02-24: 3 [IU] via SUBCUTANEOUS

## 2015-02-23 MED ORDER — LORAZEPAM 2 MG/ML IJ SOLN
0.5000 mg | Freq: Once | INTRAMUSCULAR | Status: AC
  Administered 2015-02-24: 0.5 mg via INTRAVENOUS
  Filled 2015-02-23: qty 1

## 2015-02-23 MED ORDER — HEPARIN SODIUM (PORCINE) 5000 UNIT/ML IJ SOLN
5000.0000 [IU] | Freq: Three times a day (TID) | INTRAMUSCULAR | Status: DC
  Administered 2015-02-23 – 2015-02-26 (×9): 5000 [IU] via SUBCUTANEOUS
  Filled 2015-02-23 (×9): qty 1

## 2015-02-23 MED ORDER — SODIUM CHLORIDE 0.9 % IJ SOLN
3.0000 mL | Freq: Two times a day (BID) | INTRAMUSCULAR | Status: DC
  Administered 2015-02-23 – 2015-02-26 (×6): 3 mL via INTRAVENOUS

## 2015-02-23 MED ORDER — ACETAMINOPHEN 650 MG RE SUPP
650.0000 mg | Freq: Four times a day (QID) | RECTAL | Status: DC | PRN
  Administered 2015-02-23: 650 mg via RECTAL
  Filled 2015-02-23: qty 1

## 2015-02-23 MED ORDER — INSULIN ASPART 100 UNIT/ML ~~LOC~~ SOLN
0.0000 [IU] | Freq: Three times a day (TID) | SUBCUTANEOUS | Status: DC
  Administered 2015-02-24 – 2015-02-25 (×4): 5 [IU] via SUBCUTANEOUS

## 2015-02-23 MED ORDER — MAGNESIUM SULFATE 2 GM/50ML IV SOLN
2.0000 g | Freq: Once | INTRAVENOUS | Status: AC
  Administered 2015-02-23: 2 g via INTRAVENOUS
  Filled 2015-02-23: qty 50

## 2015-02-23 MED ORDER — ACETAMINOPHEN 325 MG PO TABS: 650.0000 mg | ORAL_TABLET | Freq: Four times a day (QID) | ORAL | Status: DC | PRN

## 2015-02-23 NOTE — ED Provider Notes (Addendum)
CSN: [id number]     Arrival date & time 02/23/15  1526 History   First MD Initiated Contact with Patient 02/23/15 1603     Chief Complaint  Patient presents with  . Altered Mental Status     (Consider location/radiation/quality/duration/timing/severity/associated sxs/prior Treatment) HPI Level V caveat altered mental status history is obtained from patient and from patient's boyfriend. Patient's boyfriend reports that she drove to his house 2 hours prior to coming here, she was clearly confused, not operate her phone. He spoke with her on the telephone approximately 11 AM today and she seemed "normal" that time. Patient has no recall of when she began to feel abnormal. She states she doesn't feel well but tells me nothing hurts. Past Medical History  Diagnosis Date  . Diabetes mellitus, type 2 (HCC)     A1C 7.6 April '13  . Hypertension   . Nonischemic cardiomyopathy (HCC)     Echo 05/19/11 EF 35% w/ mild-mod MR; R/L Heart Cath 05/21/11 - mildly elevated right heart pressures, normal left heart pressures, preserved cardiac output, widely patent coronary arteries without significant CAD and moderate global systolic LV dysfunction, EF 35-40%.  . Systolic CHF (HCC)     Echo 05/19/11 EF 35% w/ mild-mod MR   Past Surgical History  Procedure Laterality Date  . Tubal ligation  1996  . Dilation and curettage of uterus    . Cervix lesion destruction    . Left heart catheterization with coronary angiogram N/A 05/21/2011    Procedure: LEFT HEART CATHETERIZATION WITH CORONARY ANGIOGRAM;  Surgeon: Tonny Bollman, MD;  Location: [facility] CATH LAB;  Service: Cardiovascular;  Laterality: N/A;   Family History  Problem Relation Age of Onset  . Diabetes      multiple  . Heart failure Paternal Grandmother   . Heart disease      multiple   Social History  Substance Use Topics  . Smoking status: Never Smoker   . Smokeless tobacco: None  . Alcohol Use: No   denies alcohol tobacco or drug use OB History    No data available     Review of Systems  Unable to perform ROS: Mental status change  Allergic/Immunologic: Positive for immunocompromised state.       Diabetic      Allergies  Review of patient's allergies indicates no known allergies.  Home Medications   Prior to Admission medications   Medication Sig Start Date End Date Taking? Authorizing Provider  carvedilol (COREG) 25 MG tablet Take 1 tablet (25 mg total) by mouth 2 (two) times daily with a meal. Patient not taking: Reported on 04/07/2014 10/30/12   Lewayne Bunting, MD  furosemide (LASIX) 20 MG tablet Take 20 mg by mouth daily.    Historical Provider, MD  furosemide (LASIX) 40 MG tablet Take 1 tablet (40 mg total) by mouth daily. Patient not taking: Reported on 04/07/2014 08/14/11 10/30/12  Lewayne Bunting, MD  ibuprofen (ADVIL,MOTRIN) 200 MG tablet Take 400 mg by mouth every 4 (four) hours as needed (for pain/numbness/tingling). pain    Historical Provider, MD  lisinopril (PRINIVIL,ZESTRIL) 40 MG tablet Take 1 tablet (40 mg total) by mouth daily. Patient not taking: Reported on 04/07/2014 08/14/11 10/30/12  Lewayne Bunting, MD  metFORMIN (GLUCOPHAGE) 1000 MG tablet Take 1,000 mg by mouth daily.    Historical Provider, MD  metoprolol (LOPRESSOR) 50 MG tablet Take 50 mg by mouth daily.    Historical Provider, MD  potassium chloride SA (K-DUR,KLOR-CON) 20 MEQ tablet Take  1 tablet (20 mEq total) by mouth daily. Patient not taking: Reported on 04/07/2014 08/14/11 10/30/12  Lewayne Bunting, MD  spironolactone (ALDACTONE) 25 MG tablet Take 1 tablet (25 mg total) by mouth daily. Patient not taking: Reported on 04/07/2014 10/30/12   Lewayne Bunting, MD   BP 182/127 mmHg  Pulse 129  Temp(Src) 98.2 F (36.8 C) (Oral)  Resp 16  Ht 5\' 6"  (1.676 m)  Wt 229 lb (103.874 kg)  BMI 36.98 kg/m2  SpO2 100%  LMP  (LMP Unknown) Physical Exam  Constitutional: She appears well-developed and well-nourished. No distress.  HENT:  Head: Normocephalic  and atraumatic.  Right Ear: External ear normal.  Left Ear: External ear normal.  Mouth/Throat: Oropharynx is clear and moist.  No facial asymmetry  Eyes: Conjunctivae are normal. Pupils are equal, round, and reactive to light.  Neck: Neck supple. No tracheal deviation present. No thyromegaly present.  Cardiovascular: Regular rhythm.   No murmur heard. Tachycardic  Pulmonary/Chest: Effort normal and breath sounds normal.  Abdominal: Soft. Bowel sounds are normal. She exhibits no distension. There is no tenderness.  Musculoskeletal: Normal range of motion. She exhibits no edema or tenderness.  Neurological: She is alert. Coordination normal.  Oriented to hospital only, does not know month oryear, doesn't know name. Moves all extremities. Lifts both legs off bed on command. When asked to squeeze my hand, she extends her fingers with both hands. Motor strength 5 over 5 overall  Skin: Skin is warm and dry. No rash noted.  Psychiatric: She has a normal mood and affect.  Nursing note and vitals reviewed.   ED Course  Procedures (including critical care time) Labs Review Labs Reviewed  CBC - Abnormal; Notable for the following:    RBC 5.53 (*)    Hemoglobin 16.3 (*)    HCT 47.8 (*)    All other components within normal limits  CBG MONITORING, ED - Abnormal; Notable for the following:    Glucose-Capillary 326 (*)    All other components within normal limits  COMPREHENSIVE METABOLIC PANEL  PROTIME-INR  CBG MONITORING, ED  I-STAT TROPOININ, ED    Imaging Review Ct Head Wo Contrast  02/23/2015  CLINICAL DATA:  Altered mental status.  Initial encounter. EXAM: CT HEAD WITHOUT CONTRAST TECHNIQUE: Contiguous axial images were obtained from the base of the skull through the vertex without intravenous contrast. COMPARISON:  None. FINDINGS: There is no evidence of acute intracranial abnormality including hemorrhage, infarct, mass lesion, mass effect, midline shift or abnormal extra-axial fluid  collection. No hydrocephalus or pneumocephalus. The calvarium is intact. Imaged paranasal sinuses and mastoid air cells are clear. IMPRESSION: Negative head CT. Electronically Signed   By: Drusilla Kanner M.D.   On: 02/23/2015 16:08   I have personally reviewed and evaluated these images and lab results as part of my medical decision-making.   EKG Interpretation   Date/Time:  Wednesday February 23 2015 15:43:31 EST Ventricular Rate:  114 PR Interval:  150 QRS Duration: 74 QT Interval:  366 QTC Calculation: 504 R Axis:   72 Text Interpretation:  Sinus tachycardia Nonspecific T wave abnormality  Abnormal ECG SINCE LAST TRACING HEART RATE HAS INCREASED Confirmed by  Ethelda Chick  MD, Etienne Millward [id number]) on 02/23/2015 4:31:24 PM     5:50 PM patient remains confused. Exam is unchanged. She is alert, mildly tearful. Moves all extremities  Patient failed stroke swallow screen as she could not follow the commands Results for orders placed or performed during the hospital encounter  of 02/23/15  Comprehensive metabolic panel  Result Value Ref Range   Sodium 133 (L) 135 - 145 mmol/L   Potassium 4.0 3.5 - 5.1 mmol/L   Chloride 97 (L) 101 - 111 mmol/L   CO2 22 22 - 32 mmol/L   Glucose, Bld 366 (H) 65 - 99 mg/dL   BUN 9 6 - 20 mg/dL   Creatinine, Ser 5.40 0.44 - 1.00 mg/dL   Calcium 9.9 8.9 - 98.1 mg/dL   Total Protein 8.3 (H) 6.5 - 8.1 g/dL   Albumin 4.1 3.5 - 5.0 g/dL   AST 27 15 - 41 U/L   ALT 30 14 - 54 U/L   Alkaline Phosphatase 122 38 - 126 U/L   Total Bilirubin 0.9 0.3 - 1.2 mg/dL   GFR calc non Af Amer >60 >60 mL/min   GFR calc Af Amer >60 >60 mL/min   Anion gap 14 5 - 15  CBC  Result Value Ref Range   WBC 8.4 4.0 - 10.5 K/uL   RBC 5.53 (H) 3.87 - 5.11 MIL/uL   Hemoglobin 16.3 (H) 12.0 - 15.0 g/dL   HCT 19.1 (H) 47.8 - 29.5 %   MCV 86.4 78.0 - 100.0 fL   MCH 29.5 26.0 - 34.0 pg   MCHC 34.1 30.0 - 36.0 g/dL   RDW 62.1 30.8 - 65.7 %   Platelets 329 150 - 400 K/uL  Protime-INR - (order  if Patient is taking Coumadin / Warfarin)  Result Value Ref Range   Prothrombin Time 13.2 11.6 - 15.2 seconds   INR 0.98 0.00 - 1.49  CBG monitoring, ED  Result Value Ref Range   Glucose-Capillary 326 (H) 65 - 99 mg/dL  I-Stat Troponin, ED (not at [facility])  Result Value Ref Range   Troponin i, poc 0.01 0.00 - 0.08 ng/mL   Comment 3           Ct Head Wo Contrast  02/23/2015  CLINICAL DATA:  Altered mental status.  Initial encounter. EXAM: CT HEAD WITHOUT CONTRAST TECHNIQUE: Contiguous axial images were obtained from the base of the skull through the vertex without intravenous contrast. COMPARISON:  None. FINDINGS: There is no evidence of acute intracranial abnormality including hemorrhage, infarct, mass lesion, mass effect, midline shift or abnormal extra-axial fluid collection. No hydrocephalus or pneumocephalus. The calvarium is intact. Imaged paranasal sinuses and mastoid air cells are clear. IMPRESSION: Negative head CT. Electronically Signed   By: Drusilla Kanner M.D.   On: 02/23/2015 16:08   Results for orders placed or performed during the hospital encounter of 02/23/15  Comprehensive metabolic panel  Result Value Ref Range   Sodium 133 (L) 135 - 145 mmol/L   Potassium 4.0 3.5 - 5.1 mmol/L   Chloride 97 (L) 101 - 111 mmol/L   CO2 22 22 - 32 mmol/L   Glucose, Bld 366 (H) 65 - 99 mg/dL   BUN 9 6 - 20 mg/dL   Creatinine, Ser 8.46 0.44 - 1.00 mg/dL   Calcium 9.9 8.9 - 96.2 mg/dL   Total Protein 8.3 (H) 6.5 - 8.1 g/dL   Albumin 4.1 3.5 - 5.0 g/dL   AST 27 15 - 41 U/L   ALT 30 14 - 54 U/L   Alkaline Phosphatase 122 38 - 126 U/L   Total Bilirubin 0.9 0.3 - 1.2 mg/dL   GFR calc non Af Amer >60 >60 mL/min   GFR calc Af Amer >60 >60 mL/min   Anion gap 14 5 - 15  CBC  Result Value Ref Range   WBC 8.4 4.0 - 10.5 K/uL   RBC 5.53 (H) 3.87 - 5.11 MIL/uL   Hemoglobin 16.3 (H) 12.0 - 15.0 g/dL   HCT 82.5 (H) 05.3 - 97.6 %   MCV 86.4 78.0 - 100.0 fL   MCH 29.5 26.0 - 34.0 pg   MCHC 34.1  30.0 - 36.0 g/dL   RDW 73.4 19.3 - 79.0 %   Platelets 329 150 - 400 K/uL  Protime-INR - (order if Patient is taking Coumadin / Warfarin)  Result Value Ref Range   Prothrombin Time 13.2 11.6 - 15.2 seconds   INR 0.98 0.00 - 1.49  CBG monitoring, ED  Result Value Ref Range   Glucose-Capillary 326 (H) 65 - 99 mg/dL  I-Stat Troponin, ED (not at [facility])  Result Value Ref Range   Troponin i, poc 0.01 0.00 - 0.08 ng/mL   Comment 3           Ct Head Wo Contrast  02/23/2015  CLINICAL DATA:  Altered mental status.  Initial encounter. EXAM: CT HEAD WITHOUT CONTRAST TECHNIQUE: Contiguous axial images were obtained from the base of the skull through the vertex without intravenous contrast. COMPARISON:  None. FINDINGS: There is no evidence of acute intracranial abnormality including hemorrhage, infarct, mass lesion, mass effect, midline shift or abnormal extra-axial fluid collection. No hydrocephalus or pneumocephalus. The calvarium is intact. Imaged paranasal sinuses and mastoid air cells are clear. IMPRESSION: Negative head CT. Electronically Signed   By: Drusilla Kanner M.D.   On: 02/23/2015 16:08    MDM  Patient is encephalopathic. She likely needs MRI scan. Final diagnoses:  None   Dr Willette Alma was consulted and will valuate patient in the emergency department. She will be admitted to telemetry NPO, Iv fluids, glucose monitoring Dx #1 acute encephalopathy #2 hyperglycemia  CRITICAL CARE Performed by: Doug Sou Total critical care time: 35 minutes Critical care time was exclusive of separately billable procedures and treating other patients. Critical care was necessary to treat or prevent imminent or life-threatening deterioration. Critical care was time spent personally by me on the following activities: development of treatment plan with patient and/or surrogate as well as nursing, discussions with consultants, evaluation of patient's response to treatment, examination of patient,  obtaining history from patient or surrogate, ordering and performing treatments and interventions, ordering and review of laboratory studies, ordering and review of radiographic studies, pulse oximetry and re-evaluation of patient's condition.  Doug Sou, MD 02/23/15 Guadlupe Spanish, MD 02/23/15 Windy Fast

## 2015-02-23 NOTE — Progress Notes (Signed)
RT wasn't notified of Carboxyhemoglobin or ABG order at 1937 . Patient was transferred to 5W at 50. RT never saw the orders. RT has notified the RT for this patients floor.

## 2015-02-23 NOTE — H&P (Signed)
Family Medicine Teaching [facility] Admission History and Physical Service Pager: [phone]  Patient name: Lada Fulbright Medical record number: [id number] Date of birth: 1965-06-06 Age: 50 y.o. Gender: female  Primary Care Provider: Jearld Lesch, MD Consultants: neuro in am Code Status: DNR (discussed on admission)  Chief Complaint: Altered mental status  Assessment and Plan: Lamica Mccart is a 50 y.o. female presenting with altered mental status . PMH is significant for Diastolic CHF, HTN, DM2, Nonischemic cardiomyopathy  # Acute Encephalopathy: CT head negative for acute processes. Glucose elevated to 366. LFTs normal.  UDS negative.  Hypertensive to 180/120s in ED but 150/90s on my exam. HR 112.  No O2 requirement.  Afebrile. No leukocytosis. No meningeal signs. DDx Ingestion (negative drug screen so far) vs CVA (CT so far does not support but has had uncontrolled DM and HTN) vs Seizures (no prior dx of this) vs Carbon monoxide poisoning (possible given season) vs infection (no evidence of meningitis or other infections on exam or labs) - Admit to telemetry under Dr Pollie Meyer - VS per floor protocol - ABG, Carboxyhemoglobin, Ammonia, Tylenol, Salicylate levels, TSH ordered - MRI brain ordered - EEG ordered - Consider c/s neuro in am. - Neuro checks every 4 hours - Permissive HTN to 180/110 for now. - SLP eval for speech and swallowing  # Type 2 Diabetes: BG elevated to 366.  Not on medications out patient - SSI here.   - A1c - CSW for assistance with medications/ PCP.  Will need to discharge on maintenance meds  # HTN: elevated on admission to 182/127 - Allow for permissive HTN for now - Will plan to start on antihypertensive therapy once stroke can be ruled out  # Combined Systolic/ Diastolic CHF/ Nonischemic cardiomyopathy: 10/2012 echo with EF: 40-45%, mild- moderate reduction in systolic function w/ diffuse hypokinesis.  Grade 1 DD also seen.  05/2011 cardiac cath  with normal coronary arteries & EF of 35-40%..  She was referred for ICD placement but decided not to pursue.  Last cardiology visit 10/2012, echo done at that time but then patient lost to follow up. In ED, iTrop neg.  No CP.  EKG with nonspecific t wave abnormalities and sinus tach otherwise unremarkable.  No evidence of fluid overload on exam. - repeat EKG in am - sTroponin  - Needs to get back on CHF meds before discharge.  FEN/GI: SLIV, NPO until passes speech eval, then HH/ carb mod diet Prophylaxis: sub-q heparin  Disposition: Admit to telemetry for evaluation of AMS.  History of Present Illness:  Kaho Selle is a 50 y.o. female presenting with AMS  Patient provides some history but majority provided by family.  Patient complained of being tired to her family yesterday afternoon.  Today, she drove to her boyfriend's home and was acting strange this afternoon.  He notes that when he talked to her on the phone around 11am she was speaking normally but when she arrive at his home she was unable to text and her expression seemed off.  Family denies imbalance/ abnormal gait, drooping of face, slurred speech.  Patient denies vision changes, weakness, imbalance, confusion, CP, SOB, nausea, vomiting, abdominal pain.  She has not ingested any new foods.  She takes NO medications.  She does not smoke or do drugs.  Social alcohol use (maybe once per month).  Denies taking any substances that are not prescribed.  Has not taken any aspirin, tylenol.  She denies pain anywhere.  She notes some "pressure" in  her head but cannot localize the pressure.  Denies pain with ocular movement, falls or trauma to her head.  She works at a temp job in a Engineer, agricultural.    Family notes a Family Hx of mental disorders including schizophrenia and bipolar d/o.  Also, aunt with stroke.  Family h/o DM, HTN.  Patient has a h/o fibroid removal but no other surgeries.  She notes that she has not been on medications  for CHF, HTN, DM2 in a couple of years d/t/ lack of insurance.   Patient's last known normal was around 11am today.  Review Of Systems: Per HPI with the following additions: none Otherwise the remainder of the systems were negative.  Patient Active Problem List   Diagnosis Date Noted  . Chest pain 10/30/2012  . Systolic CHF, acute (HCC) 05/22/2011  . Nonischemic cardiomyopathy (HCC) 05/22/2011  . Diabetes mellitus, type 2 (HCC) 05/22/2011  . Hypertension 05/22/2011  . Other primary cardiomyopathies 05/21/2011    Past Medical History: Past Medical History  Diagnosis Date  . Diabetes mellitus, type 2 (HCC)     A1C 7.6 April '13  . Hypertension   . Nonischemic cardiomyopathy (HCC)     Echo 05/19/11 EF 35% w/ mild-mod MR; R/L Heart Cath 05/21/11 - mildly elevated right heart pressures, normal left heart pressures, preserved cardiac output, widely patent coronary arteries without significant CAD and moderate global systolic LV dysfunction, EF 35-40%.  . Systolic CHF (HCC)     Echo 05/19/11 EF 35% w/ mild-mod MR    Past Surgical History: Past Surgical History  Procedure Laterality Date  . Tubal ligation  1996  . Dilation and curettage of uterus    . Cervix lesion destruction    . Left heart catheterization with coronary angiogram N/A 05/21/2011    Procedure: LEFT HEART CATHETERIZATION WITH CORONARY ANGIOGRAM;  Surgeon: Tonny Bollman, MD;  Location: [facility] CATH LAB;  Service: Cardiovascular;  Laterality: N/A;    Social History: Social History  Substance Use Topics  . Smoking status: Never Smoker   . Smokeless tobacco: None  . Alcohol Use: No   Additional social history: none  Please also refer to relevant sections of EMR.  Family History: Family History  Problem Relation Age of Onset  . Diabetes      multiple  . Heart failure Paternal Grandmother   . Heart disease      multiple   Allergies and Medications: No Known Allergies No current facility-administered medications on  file prior to encounter.   Current Outpatient Prescriptions on File Prior to Encounter  Medication Sig Dispense Refill  . ibuprofen (ADVIL,MOTRIN) 200 MG tablet Take 400 mg by mouth every 4 (four) hours as needed (for pain/numbness/tingling). pain    . carvedilol (COREG) 25 MG tablet Take 1 tablet (25 mg total) by mouth 2 (two) times daily with a meal. (Patient not taking: Reported on 04/07/2014) 60 tablet 12  . furosemide (LASIX) 20 MG tablet Take 20 mg by mouth daily. Reported on 02/23/2015    . furosemide (LASIX) 40 MG tablet Take 1 tablet (40 mg total) by mouth daily. (Patient not taking: Reported on 02/23/2015) 30 tablet 6  . lisinopril (PRINIVIL,ZESTRIL) 40 MG tablet Take 1 tablet (40 mg total) by mouth daily. (Patient not taking: Reported on 02/23/2015) 30 tablet 6  . metFORMIN (GLUCOPHAGE) 1000 MG tablet Take 1,000 mg by mouth daily. Reported on 02/23/2015    . potassium chloride SA (K-DUR,KLOR-CON) 20 MEQ tablet Take 1 tablet (20 mEq  total) by mouth daily. (Patient not taking: Reported on 02/23/2015) 30 tablet 6  . spironolactone (ALDACTONE) 25 MG tablet Take 1 tablet (25 mg total) by mouth daily. (Patient not taking: Reported on 04/07/2014) 90 tablet 3    Objective: BP 159/92 mmHg  Pulse 112  Temp(Src) 99.3 F (37.4 C) (Rectal)  Resp 17  Ht 5\' 6"  (1.676 m)  Wt 229 lb (103.874 kg)  BMI 36.98 kg/m2  SpO2 100%  LMP  (LMP Unknown) Exam: General: awake, alert, NAD Eyes: mild conjunctival injection, PERRL ENTM: o/p clear, MMM Neck: supple, no meningeal signs Cardiovascular: tachycardic, no murmurs  Respiratory: CTAB, normal work of breathing on RA Abdomen: obese, soft, NT/ND, +BS MSK: moves all extremities, 5/5 UE and LE strength, no edema, +2 posterior tibial pulses Skin: several tattoos, small areas of redness on b/l cheeks but no rashes, no lacerations or lesions to the head or neck appreciated. Neuro: AO to self, hospital and president but responds June 7th when asked year.  Can  follow some commands but not able to test strength in all fields nor test DTRs due to inability to relax, light touch sensation grossly in tact, LE cerebellar testing normal, UE cerebellar testing could not be performed secondary to inability to cooperate with exam. Negative clonus.  Difficulty finding words but was able to articulate speech normally. Psych: Mood stable, eye contact fair  Labs and Imaging:  Results for orders placed or performed during the hospital encounter of 02/23/15 (from the past 24 hour(s))  CBG monitoring, ED     Status: Abnormal   Collection Time: 02/23/15  3:35 PM  Result Value Ref Range   Glucose-Capillary 326 (H) 65 - 99 mg/dL  Comprehensive metabolic panel     Status: Abnormal   Collection Time: 02/23/15  3:40 PM  Result Value Ref Range   Sodium 133 (L) 135 - 145 mmol/L   Potassium 4.0 3.5 - 5.1 mmol/L   Chloride 97 (L) 101 - 111 mmol/L   CO2 22 22 - 32 mmol/L   Glucose, Bld 366 (H) 65 - 99 mg/dL   BUN 9 6 - 20 mg/dL   Creatinine, Ser 4.03 0.44 - 1.00 mg/dL   Calcium 9.9 8.9 - 47.4 mg/dL   Total Protein 8.3 (H) 6.5 - 8.1 g/dL   Albumin 4.1 3.5 - 5.0 g/dL   AST 27 15 - 41 U/L   ALT 30 14 - 54 U/L   Alkaline Phosphatase 122 38 - 126 U/L   Total Bilirubin 0.9 0.3 - 1.2 mg/dL   GFR calc non Af Amer >60 >60 mL/min   GFR calc Af Amer >60 >60 mL/min   Anion gap 14 5 - 15  CBC     Status: Abnormal   Collection Time: 02/23/15  3:40 PM  Result Value Ref Range   WBC 8.4 4.0 - 10.5 K/uL   RBC 5.53 (H) 3.87 - 5.11 MIL/uL   Hemoglobin 16.3 (H) 12.0 - 15.0 g/dL   HCT 25.9 (H) 56.3 - 87.5 %   MCV 86.4 78.0 - 100.0 fL   MCH 29.5 26.0 - 34.0 pg   MCHC 34.1 30.0 - 36.0 g/dL   RDW 64.3 32.9 - 51.8 %   Platelets 329 150 - 400 K/uL  Protime-INR - (order if Patient is taking Coumadin / Warfarin)     Status: None   Collection Time: 02/23/15  3:40 PM  Result Value Ref Range   Prothrombin Time 13.2 11.6 - 15.2 seconds   INR  0.98 0.00 - 1.49  I-Stat Troponin, ED (not  at [facility])     Status: None   Collection Time: 02/23/15  3:50 PM  Result Value Ref Range   Troponin i, poc 0.01 0.00 - 0.08 ng/mL   Comment 3          Urine rapid drug screen (hosp performed)     Status: None   Collection Time: 02/23/15  7:00 PM  Result Value Ref Range   Opiates NONE DETECTED NONE DETECTED   Cocaine NONE DETECTED NONE DETECTED   Benzodiazepines NONE DETECTED NONE DETECTED   Amphetamines NONE DETECTED NONE DETECTED   Tetrahydrocannabinol NONE DETECTED NONE DETECTED   Barbiturates NONE DETECTED NONE DETECTED  POC urine preg, ED (not at [facility])     Status: None   Collection Time: 02/23/15  7:14 PM  Result Value Ref Range   Preg Test, Ur NEGATIVE NEGATIVE   Ct Head Wo Contrast  02/23/2015  CLINICAL DATA:  Altered mental status.  Initial encounter. EXAM: CT HEAD WITHOUT CONTRAST TECHNIQUE: Contiguous axial images were obtained from the base of the skull through the vertex without intravenous contrast. COMPARISON:  None. FINDINGS: There is no evidence of acute intracranial abnormality including hemorrhage, infarct, mass lesion, mass effect, midline shift or abnormal extra-axial fluid collection. No hydrocephalus or pneumocephalus. The calvarium is intact. Imaged paranasal sinuses and mastoid air cells are clear. IMPRESSION: Negative head CT. Electronically Signed   By: Drusilla Kanner M.D.   On: 02/23/2015 16:08   Raliegh Ip, DO 02/23/2015, 5:47 PM PGY-2, [facility] FPTS Intern pager: [phone], text pages welcome

## 2015-02-23 NOTE — ED Notes (Signed)
Report called to the floor.

## 2015-02-23 NOTE — ED Notes (Signed)
Pt brought in by boyfriend. Per friend pt arrived at his house 2 hours ago and very altered and stating that she had a headache. On arrival to ED pt states she feels dizzy and had headache. Pt has equal grip strengths with clear speech. No arm or leg drift noted. Pt is alert and oriented to self and place. Unable to tell me what month it is.

## 2015-02-23 NOTE — ED Notes (Addendum)
The pt reports feeling strange since early 62am today.  0800 she started with a headache.  She moves all extremities  E sl weakness on her lt hand grip  No arm or leg drift. No facial droop.  She knows her name  But cannot answer the other questions asked.  Her boyfirend is at bedside  She is able to call his name but cannot tell me if she knoiws how he is related to her.  Crying intermittently

## 2015-02-23 NOTE — ED Notes (Signed)
The pt cannot follow instructions she reported that she does not know how to touch her tongue on her upper lip   Swallow screen stopped.

## 2015-02-23 NOTE — ED Notes (Signed)
Per triage patient is in CT at this time and when scan is complete patient will be brought to room

## 2015-02-23 NOTE — ED Notes (Signed)
Manual bp 146/94

## 2015-02-23 NOTE — ED Notes (Signed)
Patient has returned from being out of the department; patient undressed, in gown, on monitor, continuous pulse oximetry and blood pressure cuff; visitor at bedside

## 2015-02-23 NOTE — ED Notes (Signed)
No headache at present 

## 2015-02-23 NOTE — ED Notes (Signed)
Admitting doctor at the bedside 

## 2015-02-24 ENCOUNTER — Inpatient Hospital Stay (HOSPITAL_COMMUNITY): Payer: Medicaid Other

## 2015-02-24 ENCOUNTER — Ambulatory Visit (HOSPITAL_COMMUNITY): Payer: Medicaid Other

## 2015-02-24 DIAGNOSIS — E785 Hyperlipidemia, unspecified: Secondary | ICD-10-CM

## 2015-02-24 DIAGNOSIS — I63512 Cerebral infarction due to unspecified occlusion or stenosis of left middle cerebral artery: Principal | ICD-10-CM

## 2015-02-24 DIAGNOSIS — I6330 Cerebral infarction due to thrombosis of unspecified cerebral artery: Secondary | ICD-10-CM | POA: Insufficient documentation

## 2015-02-24 DIAGNOSIS — I5021 Acute systolic (congestive) heart failure: Secondary | ICD-10-CM

## 2015-02-24 LAB — CBC
HCT: 39.8 % (ref 36.0–46.0)
HEMOGLOBIN: 13.4 g/dL (ref 12.0–15.0)
MCH: 29.2 pg (ref 26.0–34.0)
MCHC: 33.7 g/dL (ref 30.0–36.0)
MCV: 86.7 fL (ref 78.0–100.0)
Platelets: 278 10*3/uL (ref 150–400)
RBC: 4.59 MIL/uL (ref 3.87–5.11)
RDW: 13.4 % (ref 11.5–15.5)
WBC: 7.6 10*3/uL (ref 4.0–10.5)

## 2015-02-24 LAB — LIPID PANEL
CHOL/HDL RATIO: 7.8 ratio
Cholesterol: 258 mg/dL — ABNORMAL HIGH (ref 0–200)
HDL: 33 mg/dL — ABNORMAL LOW (ref >40)
LDL CALC: 192 mg/dL — AB (ref 0–99)
Triglycerides: 164 mg/dL — ABNORMAL HIGH (ref <150)
VLDL: 33 mg/dL (ref 0–40)

## 2015-02-24 LAB — GLUCOSE, CAPILLARY
GLUCOSE-CAPILLARY: 310 mg/dL — AB (ref 65–99)
GLUCOSE-CAPILLARY: 284 mg/dL — AB (ref 65–99)
Glucose-Capillary: 283 mg/dL — ABNORMAL HIGH (ref 65–99)
Glucose-Capillary: 254 mg/dL — ABNORMAL HIGH (ref 65–99)

## 2015-02-24 LAB — BASIC METABOLIC PANEL
ANION GAP: 6 (ref 5–15)
BUN: 6 mg/dL (ref 6–20)
CHLORIDE: 104 mmol/L (ref 101–111)
CO2: 25 mmol/L (ref 22–32)
CREATININE: 0.62 mg/dL (ref 0.44–1.00)
Calcium: 8.3 mg/dL — ABNORMAL LOW (ref 8.9–10.3)
GFR calc non Af Amer: >60 mL/min (ref >60)
GLUCOSE: 287 mg/dL — AB (ref 65–99)
Potassium: 3.7 mmol/L (ref 3.5–5.1)
Sodium: 135 mmol/L (ref 135–145)

## 2015-02-24 LAB — MAGNESIUM: Magnesium: 1.7 mg/dL (ref 1.7–2.4)

## 2015-02-24 LAB — RPR: RPR Ser Ql: NONREACTIVE

## 2015-02-24 MED ORDER — INSULIN GLARGINE 100 UNIT/ML ~~LOC~~ SOLN
10.0000 [IU] | Freq: Every day | SUBCUTANEOUS | Status: DC
  Administered 2015-02-24 – 2015-02-26 (×3): 10 [IU] via SUBCUTANEOUS
  Filled 2015-02-24 (×4): qty 0.1

## 2015-02-24 MED ORDER — ASPIRIN 81 MG PO CHEW
81.0000 mg | CHEWABLE_TABLET | Freq: Every day | ORAL | Status: DC
  Administered 2015-02-24 – 2015-02-26 (×3): 81 mg via ORAL
  Filled 2015-02-24 (×3): qty 1

## 2015-02-24 MED ORDER — LORAZEPAM 2 MG/ML IJ SOLN
0.5000 mg | Freq: Once | INTRAMUSCULAR | Status: AC
  Administered 2015-02-25: 0.5 mg via INTRAVENOUS
  Filled 2015-02-24 (×2): qty 1

## 2015-02-24 MED ORDER — GADOBENATE DIMEGLUMINE 529 MG/ML IV SOLN
15.0000 mL | Freq: Once | INTRAVENOUS | Status: AC | PRN
  Administered 2015-02-24: 15 mL via INTRAVENOUS

## 2015-02-24 MED ORDER — ATORVASTATIN CALCIUM 80 MG PO TABS
80.0000 mg | ORAL_TABLET | Freq: Every day | ORAL | Status: DC
  Administered 2015-02-24 – 2015-02-26 (×3): 80 mg via ORAL
  Filled 2015-02-24 (×3): qty 1

## 2015-02-24 MED ORDER — CLOPIDOGREL BISULFATE 75 MG PO TABS
75.0000 mg | ORAL_TABLET | Freq: Every day | ORAL | Status: DC
  Administered 2015-02-24 – 2015-02-26 (×3): 75 mg via ORAL
  Filled 2015-02-24 (×3): qty 1

## 2015-02-24 NOTE — Procedures (Signed)
ELECTROENCEPHALOGRAM REPORT  Patient: [name]       Room #: 0J62 EEG No. ID: [id number] Age: 50 y.o.        Sex: female Referring Physician: Pollie Meyer, B Report Date:  02/24/2015        Interpreting Physician: Aline Brochure  History: [name] is an 50 y.o. female admitted with confusion and speech abnormality.  Indications for study:  Rule out encephalopathy; rule out seizure activity.  Technique: This is an 18 channel routine scalp EEG performed at the bedside with bipolar and monopolar montages arranged in accordance to the international 10/20 system of electrode placement.   Description: This EEG recording was performed during wakefulness and during sleep. Predominant activity during wakefulness consisted of low amplitude diffuse fast beta activity, as well as 9-10 Hz activity occasionally recorded from the posterior head region.Marland Kitchen Photic stimulation produced a symmetrical occipital driving response.  Hyperventilation was not performed. There was symmetrical slowing of background activity with mixed irregular delta and theta activity diffusely during sleep. Symmetrical vertex waves, sleep spindles and K-complexes are recorded during stage II of sleep. No epileptiform discharges were recorded. There was no abnormal slowing of cerebral activity.  Interpretation: This is a normal EEG recording during wakefulness and during sleep. No evidence of an epileptic disorder was demonstrated. However, a normal EEG recording in and of itself does not rule out seizure disorder.   Interpretation:    Venetia Maxon M.D. [facility] [phone]

## 2015-02-24 NOTE — Progress Notes (Signed)
IV team unsuccessful attempt for 20G IV pt refuses to be stuck any more right now

## 2015-02-24 NOTE — Progress Notes (Signed)
EEG Completed; Results Pending  

## 2015-02-24 NOTE — Progress Notes (Signed)
Multi attempts to gain 20 G iv for CT angiogram IV team unsuccessful.paged MD to inform.Pt wants to wait for now.

## 2015-02-24 NOTE — Progress Notes (Signed)
Md on call was notified that pt bp147/94 HR 116 and that pt had a 6 beat run of v-tach new orders were received will continue to monitor. Ilean Skill LPN

## 2015-02-24 NOTE — Progress Notes (Signed)
MD made aware of unable to get IV will Attempt again in AM per Dr Jonathon Jordan

## 2015-02-24 NOTE — Evaluation (Signed)
Clinical/Bedside Swallow Evaluation Patient Details  Name: [name] MRN: [id number] Date of Birth: [birth year]  Today's Date: 02/24/2015 Time: SLP Start Time (ACUTE ONLY): 0808 SLP Stop Time (ACUTE ONLY): 0828 SLP Time Calculation (min) (ACUTE ONLY): 20 min  Past Medical History:  Past Medical History  Diagnosis Date  . Diabetes mellitus, type 2 (HCC)     A1C 7.6 April '13  . Hypertension   . Nonischemic cardiomyopathy (HCC)     Echo 05/19/11 EF 35% w/ mild-mod MR; R/L Heart Cath 05/21/11 - mildly elevated right heart pressures, normal left heart pressures, preserved cardiac output, widely patent coronary arteries without significant CAD and moderate global systolic LV dysfunction, EF 35-40%.  . Systolic CHF (HCC)     Echo 05/19/11 EF 35% w/ mild-mod MR   Past Surgical History:  Past Surgical History  Procedure Laterality Date  . Tubal ligation  1996  . Dilation and curettage of uterus    . Cervix lesion destruction    . Left heart catheterization with coronary angiogram N/A 05/21/2011    Procedure: LEFT HEART CATHETERIZATION WITH CORONARY ANGIOGRAM;  Surgeon: Tonny Bollman, MD;  Location: [facility] CATH LAB;  Service: Cardiovascular;  Laterality: N/A;   HPI:  pt is a 50 yo female adm to [facility] with difficulty texting and problems with expressing herself.  PMH + for DM, CHF, chest pain, ? bipolar d/o per family report to MD.  CT showed left posterior temporal possible CVA. Pt did not pass an RNSSS as she was unable to follow commands = including touching tongue to lips.  Pt for MRI.     Assessment / Plan / Recommendation Clinical Impression  Pt presents with functional oropharyngeal swallow ability.  NO indications of airway compromise or residuals with all consistencies tested.  Pt challenged with 4 ounce sequential liquid swallow test without difficulties.  She also denies h/o dysphagia or reflux issues.   Recommend regular/thin diet with general precautions.    Pt demonstrates difficulties  expressing herself c/b pauses and word finding deficits. Comprehension appears good for basic needs.  Will follow up for speech/language evaluation, no follow up re: swallowing.  Thanks.     Aspiration Risk  No limitations    Diet Recommendation Regular;Thin liquid   Liquid Administration via: Cup;Straw Medication Administration: Whole meds with liquid Supervision: Patient able to self feed Compensations: Slow rate;Small sips/bites Postural Changes: Seated upright at 90 degrees    Other  Recommendations Oral Care Recommendations: Oral care BID   Follow up Recommendations  None (re: swallow function)    Frequency and Duration     n/a       Prognosis   n/a     Swallow Study   General Date of Onset: 02/24/15 HPI: pt is a 50 yo female adm to [facility] with difficulty texting and problems with expressing herself.  PMH + for DM, CHF, chest pain, ? bipolar d/o per family report to MD.  CT showed left posterior temporal possible CVA. Pt did not pass an RNSSS as she was unable to follow commands = including touching tongue to lips.  Pt for MRI.   Type of Study: Bedside Swallow Evaluation Diet Prior to this Study: NPO Temperature Spikes Noted: Yes (low grade) Respiratory Status: Room air Behavior/Cognition: Alert;Cooperative;Pleasant mood Oral Cavity Assessment: Within Functional Limits Oral Care Completed by SLP: No Oral Cavity - Dentition: Adequate natural dentition Vision: Functional for self-feeding Self-Feeding Abilities: Able to feed self Patient Positioning: Upright in bed Baseline Vocal Quality: Normal Volitional  Cough: Strong Volitional Swallow: Able to elicit    Oral/Motor/Sensory Function Overall Oral Motor/Sensory Function:  (? ataxic movements- lingual rolling when observed within oral cavity, protrusion midline and today pt able to lateralize and sweep)   Ice Chips Ice chips: Not tested   Thin Liquid Thin Liquid: Within functional limits Presentation: Straw;Self Fed;Cup     Nectar Thick Nectar Thick Liquid: Not tested   Honey Thick Honey Thick Liquid: Not tested   Puree Puree: Within functional limits Presentation: Self Fed;Spoon   Solid   GO    Solid: Within functional limits Presentation: Self Fed       TamaraLisabeth Pickillage SLP [phone]

## 2015-02-24 NOTE — Progress Notes (Signed)
PT Cancellation Note  Patient Details Name: [name] MRN: [id number] DOB: [birth year]   Cancelled Treatment:    Reason Eval/Treat Not Completed: Patient at procedure or test/unavailable (pt leaving room for EEG and unavailable for eval)   Toney Sang Beth 02/24/2015, 10:29 AM Delaney Meigs, PT [phone]

## 2015-02-24 NOTE — Progress Notes (Signed)
Spoke w/ MD ok to go to testing today w/o nurse

## 2015-02-24 NOTE — Consult Note (Signed)
Requesting Physician: Dr. Pollie Meyer    Chief Complaint: Stroke  HPI:                                                                                                                                         [name] is an 50 y.o. female who was brought to ED due to confusion. Per family she was tired all day yesterday.  Sh e called her boyfriend who noted she was talking normally. She arrived at his house and was noted not to be able to text or express herself. She feels she has improved slightly in the fact she can express herself better but still having some difficulty. She denies HTN, hypercoagulable states, sickle cell.  She has no other symptoms. She denies taking ASA.   On arrival BP was 182/127--currently 128/79  LDL 192 A1c pending  Date last known well: 02/23/2015 Time last known well: Time: 11:00 tPA Given: No: out of window     Past Medical History  Diagnosis Date  . Diabetes mellitus, type 2 (HCC)     A1C 7.6 April '13  . Hypertension   . Nonischemic cardiomyopathy (HCC)     Echo 05/19/11 EF 35% w/ mild-mod MR; R/L Heart Cath 05/21/11 - mildly elevated right heart pressures, normal left heart pressures, preserved cardiac output, widely patent coronary arteries without significant CAD and moderate global systolic LV dysfunction, EF 35-40%.  . Systolic CHF (HCC)     Echo 05/19/11 EF 35% w/ mild-mod MR    Past Surgical History  Procedure Laterality Date  . Tubal ligation  1996  . Dilation and curettage of uterus    . Cervix lesion destruction    . Left heart catheterization with coronary angiogram N/A 05/21/2011    Procedure: LEFT HEART CATHETERIZATION WITH CORONARY ANGIOGRAM;  Surgeon: Tonny Bollman, MD;  Location: [facility] CATH LAB;  Service: Cardiovascular;  Laterality: N/A;    Family History  Problem Relation Age of Onset  . Diabetes      multiple  . Heart failure Paternal Grandmother   . Heart disease      multiple   Social History:  reports that she has never  smoked. She does not have any smokeless tobacco history on file. She reports that she does not drink alcohol or use illicit drugs.  Allergies: No Known Allergies  Medications:  Prior to Admission:  Prescriptions prior to admission  Medication Sig Dispense Refill Last Dose  . albuterol (PROVENTIL HFA;VENTOLIN HFA) 108 (90 Base) MCG/ACT inhaler Inhale 1 puff into the lungs every 6 (six) hours as needed for wheezing or shortness of breath.   Past Week at Unknown time  . ibuprofen (ADVIL,MOTRIN) 200 MG tablet Take 400 mg by mouth every 4 (four) hours as needed (for pain/numbness/tingling). pain   Past Month at Unknown time  . carvedilol (COREG) 25 MG tablet Take 1 tablet (25 mg total) by mouth 2 (two) times daily with a meal. (Patient not taking: Reported on 04/07/2014) 60 tablet 12 Not Taking at Unknown time  . furosemide (LASIX) 20 MG tablet Take 20 mg by mouth daily. Reported on 02/23/2015   Not Taking at Unknown time  . furosemide (LASIX) 40 MG tablet Take 1 tablet (40 mg total) by mouth daily. (Patient not taking: Reported on 02/23/2015) 30 tablet 6 Not Taking at Unknown time  . lisinopril (PRINIVIL,ZESTRIL) 40 MG tablet Take 1 tablet (40 mg total) by mouth daily. (Patient not taking: Reported on 02/23/2015) 30 tablet 6 Not Taking at Unknown time  . metFORMIN (GLUCOPHAGE) 1000 MG tablet Take 1,000 mg by mouth daily. Reported on 02/23/2015   Not Taking at Unknown time  . potassium chloride SA (K-DUR,KLOR-CON) 20 MEQ tablet Take 1 tablet (20 mEq total) by mouth daily. (Patient not taking: Reported on 02/23/2015) 30 tablet 6 Not Taking at Unknown time  . spironolactone (ALDACTONE) 25 MG tablet Take 1 tablet (25 mg total) by mouth daily. (Patient not taking: Reported on 04/07/2014) 90 tablet 3 Not Taking at Unknown time   Scheduled: . aspirin  81 mg Oral Daily  . atorvastatin  80 mg  Oral q1800  . clopidogrel  75 mg Oral Daily  . heparin subcutaneous  5,000 Units Subcutaneous 3 times per day  . insulin aspart  0-5 Units Subcutaneous QHS  . insulin aspart  0-9 Units Subcutaneous TID WC  . insulin glargine  10 Units Subcutaneous Daily  . LORazepam  0.5 mg Intravenous Once  . sodium chloride  3 mL Intravenous Q12H    ROS:                                                                                                                                       History obtained from the patient  General ROS: negative for - chills, fatigue, fever, night sweats, weight gain or weight loss Psychological ROS: negative for - behavioral disorder, hallucinations, memory difficulties, mood swings or suicidal ideation Ophthalmic ROS: negative for - blurry vision, double vision, eye pain or loss of vision ENT ROS: negative for - epistaxis, nasal discharge, oral lesions, sore throat, tinnitus or vertigo Allergy and Immunology ROS: negative for - hives or itchy/watery eyes Hematological and Lymphatic ROS: negative for - bleeding problems, bruising or swollen lymph nodes Endocrine ROS: negative for -  galactorrhea, hair pattern changes, polydipsia/polyuria or temperature intolerance Respiratory ROS: negative for - cough, hemoptysis, shortness of breath or wheezing Cardiovascular ROS: negative for - chest pain, dyspnea on exertion, edema or irregular heartbeat Gastrointestinal ROS: negative for - abdominal pain, diarrhea, hematemesis, nausea/vomiting or stool incontinence Genito-Urinary ROS: negative for - dysuria, hematuria, incontinence or urinary frequency/urgency Musculoskeletal ROS: negative for - joint swelling or muscular weakness Neurological ROS: as noted in HPI Dermatological ROS: negative for rash and skin lesion changes  Neurologic Examination:                                                                                                      Blood pressure 128/79, pulse 101,  temperature 98 F (36.7 C), temperature source Oral, resp. rate 18, height 5\' 6"  (1.676 m), weight 103.874 kg (229 lb), last menstrual period 02/04/2015, SpO2 96 %.  HEENT-  Normocephalic, no lesions, without obvious abnormality.  Normal external eye and conjunctiva.  Normal TM's bilaterally.  Normal auditory canals and external ears. Normal external nose, mucus membranes and septum.  Normal pharynx. Cardiovascular- S1, S2 normal, pulses palpable throughout   Lungs- chest clear, no wheezing, rales, normal symmetric air entry Abdomen- normal findings: bowel sounds normal Extremities- no edema Lymph-no adenopathy palpable Musculoskeletal-no joint tenderness, deformity or swelling Skin-warm and dry, no hyperpigmentation, vitiligo, or suspicious lesions  Neurological Examination Mental Status: Alert, oriented, thought content appropriate.  Speech fluent with evidence of expressive>receptive aphasia.  Able to follow 3 step commands with mild difficluty. Cranial Nerves: II: Discs flat bilaterally; Visual fields grossly normal, pupils equal, round, reactive to light and accommodation III,IV, VI: ptosis not present, extra-ocular motions intact bilaterally V,VII: smile symmetric, facial light touch sensation normal bilaterally VIII: hearing normal bilaterally IX,X: uvula rises symmetrically XI: bilateral shoulder shrug XII: midline tongue extension Motor: Right : Upper extremity   5/5    Left:     Upper extremity   5/5  Lower extremity   5/5     Lower extremity   5/5 Tone and bulk:normal tone throughout; no atrophy noted Sensory: Pinprick and light touch intact throughout, bilaterally Deep Tendon Reflexes: 1+ and symmetric throughout Plantars: Right: downgoing   Left: downgoing Cerebellar: normal finger-to-nose, normal rapid alternating movements and normal heel-to-shin test Gait: not tested       Lab Results: Basic Metabolic Panel:  Recent Labs Lab 02/23/15 1540 02/23/15 2115  02/24/15 0540  NA 133*  --  135  K 4.0  --  3.7  CL 97*  --  104  CO2 22  --  25  GLUCOSE 366*  --  287*  BUN 9  --  6  CREATININE 0.76 0.72 0.62  CALCIUM 9.9  --  8.3*  MG  --  1.5* 1.7    Liver Function Tests:  Recent Labs Lab 02/23/15 1540  AST 27  ALT 30  ALKPHOS 122  BILITOT 0.9  PROT 8.3*  ALBUMIN 4.1   No results for input(s): LIPASE, AMYLASE in the last 168 hours.  Recent Labs Lab 02/23/15 2115  AMMONIA 17  CBC:  Recent Labs Lab 02/23/15 1540 02/23/15 2115 02/24/15 0540  WBC 8.4 8.2 7.6  HGB 16.3* 14.2 13.4  HCT 47.8* 42.1 39.8  MCV 86.4 86.3 86.7  PLT 329 348 278    Cardiac Enzymes:  Recent Labs Lab 02/23/15 2115  TROPONINI <0.03    Lipid Panel:  Recent Labs Lab 02/24/15 0540  CHOL 258*  TRIG 164*  HDL 33*  CHOLHDL 7.8  VLDL 33  LDLCALC 161*    CBG:  Recent Labs Lab 02/23/15 1535 02/23/15 2010 02/24/15 0735  GLUCAP 326* 306* 310*    Microbiology: Results for orders placed or performed during the hospital encounter of 07/02/07  Wet prep, genital     Status: Abnormal   Collection Time: 07/02/07 12:26 PM  Result Value Ref Range Status   Yeast Wet Prep HPF POC NONE SEEN  Final   Trich, Wet Prep NONE SEEN  Final   Clue Cells Wet Prep HPF POC NONE SEEN  Final   WBC, Wet Prep HPF POC FEW FEW BACTERIA SEEN (A)  Final    Coagulation Studies:  Recent Labs  02/23/15 1540  LABPROT 13.2  INR 0.98    Imaging: Ct Head Wo Contrast  02/23/2015  CLINICAL DATA:  Altered mental status.  Initial encounter. EXAM: CT HEAD WITHOUT CONTRAST TECHNIQUE: Contiguous axial images were obtained from the base of the skull through the vertex without intravenous contrast. COMPARISON:  None. FINDINGS: There is no evidence of acute intracranial abnormality including hemorrhage, infarct, mass lesion, mass effect, midline shift or abnormal extra-axial fluid collection. No hydrocephalus or pneumocephalus. The calvarium is intact. Imaged  paranasal sinuses and mastoid air cells are clear. IMPRESSION: Negative head CT. Electronically Signed   By: Drusilla Kanner M.D.   On: 02/23/2015 16:08   Mr Laqueta Jean WR Contrast  02/24/2015  CLINICAL DATA:  Initial evaluation for acute encephalopathy. EXAM: MRI HEAD WITHOUT AND WITH CONTRAST TECHNIQUE: Multiplanar, multiecho pulse sequences of the brain and surrounding structures were obtained without and with intravenous contrast. CONTRAST:  15mL MULTIHANCE GADOBENATE DIMEGLUMINE 529 MG/ML IV SOLN COMPARISON:  Prior head CT from 02/23/2015. FINDINGS: There are patchy multi focal ischemic infarcts involving the cortical gray matter and subcortical white matter of the left MCA distribution. These involve the left frontal lobe, left parietal lobe, and left temporal occipital region. These are positioned in a somewhat watershed distribution. Additional punctate infarct within the left caudate head (series 4, image 30). No associated hemorrhage or mass effect. No acute infarcts involving the right cerebral hemisphere or infratentorial brain. Major intracranial vascular flow voids are preserved. Cerebral volume within normal limits for patient age. No significant white matter disease. Probable tiny remote cerebellar infarcts noted. No mass lesion, midline shift, or mass effect. No hydrocephalus. No extra-axial fluid collection. Minimal patchy cortical/leptomeningeal enhancement about the areas of infarction. No other abnormal enhancement. Craniocervical junction normal. Pituitary gland within normal limits. No acute abnormality about the orbits. Paranasal sinuses are clear. Small right mastoid effusion noted. This is likely benign. Left mastoid air cells clear. Inner ear structures grossly normal. Bone marrow signal intensity within normal limits. No scalp soft tissue abnormality. IMPRESSION: 1. Patchy multi focal acute nonhemorrhagic left MCA territory ischemic infarcts as above. These are somewhat in a watershed  distribution. Further imaging of the arterial vasculature of the head and neck recommended to evaluate for proximal stenosis. Additionally, query recent hypotensive episode. 2. Probable tiny remote bilateral cerebellar infarcts. 3. Otherwise normal brain MRI. Electronically Signed  By: Rise Mu M.D.   On: 02/24/2015 06:33       Assessment and plan discussed with with attending physician and they are in agreement.    Felicie Morn PA-C [facility] [phone]  02/24/2015, 9:20 AM   Assessment: 50 y.o. female with new onset Expressive>receptive aphasia. MRI shows multiple Infarcts in the left MCA territory. Most consistent with large vessel occlusion or embolus that temporarily occluded and subsequently broke up and went distally.   Stroke Risk Factors - diabetes mellitus and hypertension  1. HgbA1c, fasting lipid panel 2. CT angiogram of the head and neck  3. Frequent neuro checks 4. Echocardiogram 5. Carotid dopplers are not needed given the CT angiogram 6. Prophylactic-she is on dual antiplatelet therapy, I would not favor continuing dual antiplatelet therapy for longer than 21 days if stroke prevention is the only indication 7. Risk factor modification 8. Telemetry monitoring 9. PT consult, OT consult, Speech consult  Ritta Slot, MD [facility] [phone]  If 7pm- 7am, please page neurology on call as listed in AMION.

## 2015-02-24 NOTE — Progress Notes (Signed)
Results for KWYNN, MCCULLICK (MRN [id number]) as of 02/24/2015 11:00  Ref. Range 02/23/2015 15:35 02/23/2015 20:10 02/24/2015 07:35  Glucose-Capillary Latest Ref Range: 65-99 mg/dL 973 (H) 532 (H) 992 (H)  Noted that CBGs greater than 300 mg/dl. Recommend increasing Lantus to 20 units daily and changing Novolog correction scale to MODERATE TID & HS. Titrate Lantus dose as needed. Will continue to monitor blood sugars in the hospital. Smith Mince RN BSN CDE

## 2015-02-24 NOTE — Progress Notes (Signed)
Family Medicine Teaching Service Daily Progress Note Intern Pager: [phone]  Patient name: [name] Medical record number: [id number] Date of birth: 08/22/1965 Age: 50 y.o. Gender: female  Primary Care Provider: Jearld Lesch, MD Consultants: neurology Code Status: DNR  Pt Overview and Major Events to Date:  01/11-Admitted with aphasia. MRI with with stroke on left MCA teritory  Assessment and Plan: [name] is a 50 y.o. female presenting with altered mental status . PMH is significant for Diastolic CHF, HTN, DM2, Nonischemic cardiomyopathy  Acute Encephalopathy from CVA: improved. No aphasia. CT head negative for acute processes. MRI brain Patchy multi focal acute nonhemorrhagic left MCA territory ischemic infarcts in a watershed distribution and probable tiny remote bilateral cerebellar infarcts. Hypertensive to 180/120s in ED. Glucose elevated to 366 but ABG with mild resp alkalosis. LFTs and ammonia normal. UDS, tylenol and salicylate  negative.Afebrile. No leukocytosis. No meningeal signs. Carboxyhemoglobin slightly elevated to 1.7. Methemoglobin and TSH within normal range. Lipid panel with LDL of 192 and HDL of 33. - Neuorolgy consult this morning  -MRA/Carotid doppler/Echo/CXR - Card consult for Holter monitor - SLP/PT/OT - Neuro check and vital signs every 4 hours - Permissive HTN to 180/110 for now, gently treat if over 220/120. Normalize over 5-7 days - Atorvastatin/Plavix/ASA once she pass bedside swallow test - Follow up wtih CM for med assistance and PCP  Type 2 Diabetes: BG 366 on admission, 310 this morning. Not on medications out patient - Add Lantus 10 units - SSI here - f/u A1c  HTN: elevated on admission to 182/127. In 120's/70's this morning. -Monitor for hypotension - manage hypertension as above  HFmrEF/ NICM: 10/2012 echo with EF: 40-45%, mild- moderate reduction in systolic function w/ diffuse hypokinesis. Grade 1 DD also seen. 05/2011  cardiac cath with normal coronary arteries & EF of 35-40%.. She was referred for ICD placement but decided not to pursue. Last cardiology visit 10/2012, echo done at that time but then patient lost to follow up. In ED, iTrop neg. No CP. EKG: sinus tach with nonspecific TW abnormalities unremarkable.Repeat EKG with TWI in lateral leads. No evidence of fluid overload on exam.Appears euvolimic of exam. - sTroponin: neg x1 - f/u echo - Needs to get back on CHF meds before discharge.  FEN/GI:  NPO pending bedside swallow. Then HH/ carb mod diet Prophylaxis: sub-q heparin  Disposition: home pending stroke work-up and neuro improvement.   Subjective:  No complaints today. Aphasia resolved. No weakness, tingling, numbness or vision changes.   Objective: Temp:  [97.5 F (36.4 C)-99.3 F (37.4 C)] 98 F (36.7 C) (01/12 0531) Pulse Rate:  [101-129] 101 (01/12 0531) Resp:  [16-18] 18 (01/12 0531) BP: (128-182)/(79-127) 128/79 mmHg (01/12 0531) SpO2:  [96 %-100 %] 96 % (01/12 0531) Weight:  [229 lb (103.874 kg)] 229 lb (103.874 kg) (01/11 1538) Physical Exam: Gen: appears well, no distress CV: regular rate and rythm. S1 & S2 audible Resp: no apparent work of breathing, clear to auscultation bilaterally. GI: bowel sounds normal, no tenderness to palpation Skin: no lesion Neuro: AAOx4 but appears slow, CN II-XII intact, motor 5/5 in all muscle groups, sensation intact, finger to nose and heel on shin normal, babinski negative, no ddk  Laboratory:  Recent Labs Lab 02/23/15 1540 02/23/15 2115 02/24/15 0540  WBC 8.4 8.2 7.6  HGB 16.3* 14.2 13.4  HCT 47.8* 42.1 39.8  PLT 329 348 278    Recent Labs Lab 02/23/15 1540 02/23/15 2115 02/24/15 0540  NA 133*  --  135  K 4.0  --  3.7  CL 97*  --  104  CO2 22  --  25  BUN 9  --  6  CREATININE 0.76 0.72 0.62  CALCIUM 9.9  --  8.3*  PROT 8.3*  --   --   BILITOT 0.9  --   --   ALKPHOS 122  --   --   ALT 30  --   --   AST 27  --   --    GLUCOSE 366*  --  287*    Imaging/Diagnostic Tests: Ct Head Wo Contrast Negative head CT. Electronically Signed   By: Drusilla Kanner M.D.   On: 02/23/2015 16:08   Mr Brain W Wo Contrast Patchy multi focal acute nonhemorrhagic left MCA territory ischemic infarcts as above. These are somewhat in a watershed distribution. Further imaging of the arterial vasculature of the head and neck recommended to evaluate for proximal stenosis. Additionally, query recent hypotensive episode. 2. Probable tiny remote bilateral cerebellar infarcts. 3. Otherwise normal brain MRI. Electronically Signed   By: Rise Mu M.D.   On: 02/24/2015 06:33    Michelle Hercules, MD 02/24/2015, 7:16 AM PGY-1, [place] [facility] FPTS Intern pager: [phone], text pages welcome

## 2015-02-24 NOTE — Progress Notes (Signed)
Utilization review completed. Yan Okray, RN, BSN. 

## 2015-02-24 NOTE — Evaluation (Signed)
Physical Therapy Evaluation Patient Details Name: [name] MRN: [id number] DOB: [birth year] Today's Date: 02/24/2015   History of Present Illness  pt is a 50 yo female adm to [facility] with difficulty texting and problems with expressing herself. PMH + for DM, CHF, chest pain, ? bipolar d/o per family report to MD. MRI shows Patchy multi focal acute nonhemorrhagic left MCA territory ischemic infarcts.  Clinical Impression  Pt admitted with above diagnosis. Pt currently with functional limitations due to the deficits listed below (see PT Problem List).  Pt will benefit from skilled PT to increase their independence and safety with mobility to allow discharge to the venue listed below.  Pt with some unsteadiness near end of gait with posterior lean with changing directions, but no LOB.  Anticipate continued progress.  Will need stair training and higher level balance training, but do not feel she will need any post acute PT.     Follow Up Recommendations No PT follow up    Equipment Recommendations  None recommended by PT    Recommendations for Other Services       Precautions / Restrictions Precautions Precautions: Fall Restrictions Weight Bearing Restrictions: No      Mobility  Bed Mobility Overal bed mobility: Modified Independent                Transfers Overall transfer level: Modified independent                  Ambulation/Gait Ambulation/Gait assistance: Min guard Ambulation Distance (Feet): 80 Feet Assistive device: None Gait Pattern/deviations: Drifts right/left;Step-through pattern;Leaning posteriorly     General Gait Details: Generally steady, but at times with slight posterior lean, but not enough to cause LOB.  Pt did not want to walk any farther, but feel she could have.  Stairs            Wheelchair Mobility    Modified Rankin (Stroke Patients Only) Modified Rankin (Stroke Patients Only) Pre-Morbid Rankin Score: No symptoms Modified  Rankin: No significant disability     Balance Overall balance assessment: Needs assistance Sitting-balance support: Feet supported         Standing balance-Leahy Scale: Good                 High Level Balance Comments: Pt able to manage tight space and take a few steps backwards.             Pertinent Vitals/Pain Pain Assessment: No/denies pain    Home Living Family/patient expects to be discharged to:: Private residence Living Arrangements: Children (21 & 54 y/o) Available Help at Discharge: Available 24 hours/day;Family Type of Home: Apartment Home Access: Stairs to enter   Entrance Stairs-Number of Steps: 4 Home Layout: One level Home Equipment: None      Prior Function Level of Independence: Independent         Comments: currently not working     Higher education careers adviser        Extremity/Trunk Assessment   Upper Extremity Assessment: Defer to OT evaluation           Lower Extremity Assessment: Overall WFL for tasks assessed      Cervical / Trunk Assessment: Normal  Communication   Communication: Expressive difficulties;Other (comment) (says "yes" a lot, laughing)  Cognition Arousal/Alertness: Awake/alert Behavior During Therapy: WFL for tasks assessed/performed Overall Cognitive Status: Within Functional Limits for tasks assessed  General Comments      Exercises        Assessment/Plan    PT Assessment Patient needs continued PT services  PT Diagnosis Difficulty walking   PT Problem List Decreased balance;Decreased mobility  PT Treatment Interventions Gait training;Functional mobility training;Balance training;Stair training   PT Goals (Current goals can be found in the Care Plan section) Acute Rehab PT Goals Patient Stated Goal: Go home PT Goal Formulation: With patient Time For Goal Achievement: 03/03/15 Potential to Achieve Goals: Good    Frequency Min 4X/week   Barriers to discharge  Inaccessible home environment      Co-evaluation               End of Session Equipment Utilized During Treatment: Gait belt Activity Tolerance: Patient tolerated treatment well Patient left: in chair;with call bell/phone within reach;with chair alarm set;with nursing/sitter in room;Other (comment) (with MD)           Time: 3833-3832 PT Time Calculation (min) (ACUTE ONLY): 14 min   Charges:   PT Evaluation $PT Eval Moderate Complexity: 1 Procedure     PT G Codes:        Elinda Bunten LUBECK 02/24/2015, 12:38 PM

## 2015-02-25 ENCOUNTER — Inpatient Hospital Stay (HOSPITAL_COMMUNITY): Payer: Medicaid Other

## 2015-02-25 ENCOUNTER — Encounter (HOSPITAL_COMMUNITY): Payer: Self-pay | Admitting: Radiology

## 2015-02-25 ENCOUNTER — Ambulatory Visit (HOSPITAL_COMMUNITY): Payer: Medicaid Other

## 2015-02-25 DIAGNOSIS — I6330 Cerebral infarction due to thrombosis of unspecified cerebral artery: Secondary | ICD-10-CM

## 2015-02-25 DIAGNOSIS — G9340 Encephalopathy, unspecified: Secondary | ICD-10-CM

## 2015-02-25 DIAGNOSIS — I6789 Other cerebrovascular disease: Secondary | ICD-10-CM

## 2015-02-25 LAB — GLUCOSE, CAPILLARY
GLUCOSE-CAPILLARY: 300 mg/dL — AB (ref 65–99)
GLUCOSE-CAPILLARY: 280 mg/dL — AB (ref 65–99)
Glucose-Capillary: 217 mg/dL — ABNORMAL HIGH (ref 65–99)
Glucose-Capillary: 210 mg/dL — ABNORMAL HIGH (ref 65–99)

## 2015-02-25 MED ORDER — IOHEXOL 350 MG/ML SOLN
50.0000 mL | Freq: Once | INTRAVENOUS | Status: AC | PRN
  Administered 2015-02-25: 50 mL via INTRAVENOUS

## 2015-02-25 MED ORDER — INSULIN ASPART 100 UNIT/ML ~~LOC~~ SOLN
0.0000 [IU] | Freq: Every day | SUBCUTANEOUS | Status: DC
  Administered 2015-02-25: 2 [IU] via SUBCUTANEOUS

## 2015-02-25 MED ORDER — INSULIN STARTER KIT- SYRINGES (ENGLISH)
1.0000 | Freq: Once | Status: DC
  Filled 2015-02-25: qty 1

## 2015-02-25 MED ORDER — INSULIN ASPART 100 UNIT/ML ~~LOC~~ SOLN
0.0000 [IU] | Freq: Three times a day (TID) | SUBCUTANEOUS | Status: DC
  Administered 2015-02-25 – 2015-02-26 (×2): 5 [IU] via SUBCUTANEOUS
  Administered 2015-02-26: 11 [IU] via SUBCUTANEOUS
  Administered 2015-02-26: 5 [IU] via SUBCUTANEOUS

## 2015-02-25 NOTE — Progress Notes (Signed)
Preliminary results by tech - Carotid duplex completed. No evidence of a significant stenosis in bilateral carotid arteries. Vertebral arteries demonstrate antegrade flow. Marilynne Halsted, BS, RDMS, RVT

## 2015-02-25 NOTE — Progress Notes (Signed)
Pharmacist transitions of care counseling completed  Pt was counseled on ASA, lipitor, and plavix including potential SE such as risk of bleeding.  Pt demonstrated understanding of her regimen.  Hazle Nordmann, PharmD Pharmacy Resident [phone]

## 2015-02-25 NOTE — Patient Instructions (Signed)
Leg Curl - With Support      Holding onto back of chair, or something stable.  Kick one leg back.  Then repeat with other leg, alternating legs.  Do 10 repetitions.   To challenge:  Try to raise hands off of chair.     Toe / Heel Raise    Holding onto back of chair or something stable, gently rock back on heels and raise toes. Then rock forward on toes and raise heels. Repeat sequence 10 times.   To challenge:  Try to raise hands off of chair.      FUNCTIONAL MOBILITY: Marching - Standing    Holding onto back of chair or something stable,march in place by lifting left leg up, then right. Alternate. Repeat 10 times. To challenge:  Try to raise hands off of chair/surface.   SINGLE LIMB STANCE     Holding onto chair or stable surface with one hand, try lifting one leg and balancing on the other.  Hold for 5 seconds.  Repeat with other leg. To challenge:  Try to lift hand off chair.

## 2015-02-25 NOTE — Progress Notes (Cosign Needed)
Family Medicine Teaching Service Daily Progress Note Intern Pager: [phone]  Patient name: [name] Medical record number: [id number] Date of birth: 05/27/1965 Age: 50 y.o. Gender: female  Primary Care Provider: Jearld Lesch, MD Consultants: neurology Code Status: DNR  Pt Overview and Major Events to Date:  01/11-Admitted with aphasia. MRI with with stroke on left MCA teritory  Assessment and Plan: [name] is a 50 y.o. female presenting with altered mental status . PMH is significant for Diastolic CHF, HTN, DM2, Nonischemic cardiomyopathy  Acute Encephalopathy from CVA: improved.  CT head negative for acute processes. MRI brain Patchy multi focal acute nonhemorrhagic left MCA territory ischemic infarcts in a watershed distribution and probable tiny remote bilateral cerebellar infarcts. Hypertensive to 180/120s in ED. Glucose elevated to 366 but ABG with mild resp alkalosis. LFTs and ammonia normal. UDS, tylenol and salicylate  negative.Afebrile. No leukocytosis. No meningeal signs. Carboxyhemoglobin slightly elevated to 1.7. Methemoglobin and TSH within normal range. Lipid panel with LDL of 192 and HDL of 33. - Appreciate neuro recs:   -CTA/Echo - Card consulted for Holter monitor - SLP/PT/OT - Neuro check and vital signs every 4 hours - Permissive HTN to 180/110 for now, gently treat if over 220/120. Normalize over 5-7 days - Atorvastatin/Plavix/ASA once she pass bedside swallow test - Follow up wtih CM for med assistance and PCP  Type 2 Diabetes: BG 366 on admission, 300 this morning. Not on medications out patient - Continue Lantus 10 units - will change SSI to moderate. - f/u A1c - Consult diabetic coordinator - NPH 70/30 on discharge  HTN: elevated on admission to 182/127. In 117/71 this morning. - Monitor for hypotension - manage hypertension as above  HFmrEF/ NICM: 10/2012 Echo with EF: 40-45%, mild- moderate reduction in systolic function w/ diffuse  hypokinesis. Grade 1 DD also seen. 05/2011 cardiac cath with normal coronary arteries & EF of 35-40%.. She was referred for ICD placement but decided not to pursue. Last cardiology visit 10/2012, echo done at that time but then patient lost to follow up. In ED, iTrop neg. No CP. EKG: sinus tach with nonspecific TW abnormalities unremarkable.Repeat EKG with TWI in lateral leads. No evidence of fluid overload on exam.Appears euvolimic of exam. - Troponin: neg x1 - f/u echo - Needs to get back on CHF meds before discharge.  FEN/GI:  Heart healthy carb modified diet. Prophylaxis: sub-q heparin  Disposition: home pending stroke work-up and neuro improvement.   Subjective:  No complaints today. Aphasia resolved. No weakness, tingling, numbness or vision changes.   Objective: Temp:  [97.7 F (36.5 C)-98.5 F (36.9 C)] 97.7 F (36.5 C) (01/13 0558) Pulse Rate:  [99-118] 99 (01/13 0558) Resp:  [16-18] 16 (01/13 0558) BP: (117-139)/(71-96) 117/71 mmHg (01/13 0558) SpO2:  [96 %-100 %] 100 % (01/13 0558) Physical Exam: Gen: appears well, no distress CV: regular rate and rythm. S1 & S2 audible Resp: no apparent work of breathing, clear to auscultation bilaterally. GI: bowel sounds normal, no tenderness to palpation Skin: no lesion Neuro: AAOx4, CN II-XII intact, motor 5/5 in all muscle groups, sensation intact, finger to nose and heel on shin normal, babinski negative, no ddk  Laboratory:  Recent Labs Lab 02/23/15 1540 02/23/15 2115 02/24/15 0540  WBC 8.4 8.2 7.6  HGB 16.3* 14.2 13.4  HCT 47.8* 42.1 39.8  PLT 329 348 278    Recent Labs Lab 02/23/15 1540 02/23/15 2115 02/24/15 0540  NA 133*  --  135  K 4.0  --  3.7  CL 97*  --  104  CO2 22  --  25  BUN 9  --  6  CREATININE 0.76 0.72 0.62  CALCIUM 9.9  --  8.3*  PROT 8.3*  --   --   BILITOT 0.9  --   --   ALKPHOS 122  --   --   ALT 30  --   --   AST 27  --   --   GLUCOSE 366*  --  287*    Imaging/Diagnostic  Tests: Ct Head Wo Contrast Negative head CT. Electronically Signed   By: Drusilla Kanner M.D.   On: 02/23/2015 16:08   Mr Brain W Wo Contrast Patchy multi focal acute nonhemorrhagic left MCA territory ischemic infarcts as above. These are somewhat in a watershed distribution. Further imaging of the arterial vasculature of the head and neck recommended to evaluate for proximal stenosis. Additionally, query recent hypotensive episode. 2. Probable tiny remote bilateral cerebellar infarcts. 3. Otherwise normal brain MRI. Electronically Signed   By: Rise Mu M.D.   On: 02/24/2015 06:33    Almon Hercules, MD 02/25/2015, 7:23 AM PGY-1, [facility] Family Medicine FPTS Intern pager: [phone], text pages welcome

## 2015-02-25 NOTE — Progress Notes (Signed)
SLP Cancellation Note  Patient Details Name: Sharyah Hwa MRN: [id number] DOB: [birth year]   Cancelled treatment:       Reason Eval/Treat Not Completed: Patient at procedure or test/unavailable   Maxcine Ham, M.A. CCC-SLP [phone]  Maxcine Ham 02/25/2015, 3:06 PM

## 2015-02-25 NOTE — Evaluation (Signed)
Occupational Therapy Evaluation Patient Details Name: [name] MRN: [id number] DOB: 12-28-1965 Today's Date: 02/25/2015    History of Present Illness pt is a 50 yo female adm to [facility] with difficulty texting and problems with expressing herself. PMH + for DM, CHF, chest pain, ? bipolar d/o per family report to MD. MRI shows Patchy multi focal acute nonhemorrhagic left MCA territory ischemic infarcts.   Clinical Impression   Patient presenting with decreased ADL and functional mobility independence secondary to above. Patient independent PTA. Patient currently functioning at an overall distant supervision level. Patient will benefit from acute OT to increase overall independence in the areas of ADLs, functional mobility, and overall safety in order to safely discharge home with intermittent supervision from two children who work second shift per patient report.     Follow Up Recommendations  No OT follow up;Supervision - Intermittent    Equipment Recommendations  Other (comment) (TBD)    Recommendations for Other Services  None at this time    Precautions / Restrictions Precautions Precautions: Fall Restrictions Weight Bearing Restrictions: No   Mobility Bed Mobility Overal bed mobility: Modified Independent  Transfers Overall transfer level: Needs assistance   Transfers: Sit to/from Stand Sit to Stand: Supervision         General transfer comment: distant supervision for safety    Balance Overall balance assessment: Needs assistance Sitting-balance support: No upper extremity supported;Feet supported Sitting balance-Leahy Scale: Good     Standing balance support: No upper extremity supported;During functional activity Standing balance-Leahy Scale: Good    ADL Overall ADL's : Needs assistance/impaired General ADL Comments: Pt overall supervision for ADLs and functional mobility/ambulation without use of RW.      Vision Additional Comments: Unable to  assess, technician present to take pt to CT scan. Plan to further assess next OT session.           Pertinent Vitals/Pain Pain Assessment: No/denies pain     Hand Dominance Right   Extremity/Trunk Assessment Upper Extremity Assessment Upper Extremity Assessment: Generalized weakness;RUE deficits/detail RUE Deficits / Details: Right weaker than left, strength grossly 4/5. Decreased coordination in right UE. However, pt adequate to discharge home; [facility] is [facility]   Lower Extremity Assessment Lower Extremity Assessment: Defer to PT evaluation   Cervical / Trunk Assessment Cervical / Trunk Assessment: Normal   Communication     Cognition Arousal/Alertness: Awake/alert Behavior During Therapy: WFL for tasks assessed/performed Overall Cognitive Status: Impaired/Different from baseline Area of Impairment: Memory     Memory: Decreased short-term memory         General Comments: technician present to take patient for CT scan, tech explained what they were taking her down for and within a couple minutes patient asked what was going on. During session, pt laughing at random things.               Home Living Family/patient expects to be discharged to:: Private residence Living Arrangements: Children (21 & 48 yo work second shift) Available Help at Discharge: Available 24 hours/day;Family Type of Home: Apartment Home Access: Stairs to enter [employer] of Steps: 4   Home Layout: One level     Bathroom Shower/Tub: Tub/shower unit;Door (walk-in shower entered by PT??)   Bathroom Toilet: Standard     Home Equipment: None   Prior Functioning/Environment Level of Independence: Independent  Comments: currently not working    OT Diagnosis: Generalized weakness   OT Problem List: Decreased strength;Decreased activity tolerance;Impaired balance (sitting and/or standing);Decreased coordination;Decreased safety awareness;Decreased  knowledge of use of DME or AE   OT  Treatment/Interventions: Self-care/ADL training;Therapeutic exercise;Neuromuscular education;Therapeutic activities;Patient/family education;Balance training    OT Goals(Current goals can be found in the care plan section) Acute Rehab OT Goals Patient Stated Goal: Go home OT Goal Formulation: With patient Time For Goal Achievement: 03/04/15 Potential to Achieve Goals: Good ADL Goals Pt Will Perform Grooming: standing;Independently Pt Will Transfer to Toilet: Independently;ambulating Pt Will Perform Tub/Shower Transfer: Tub transfer;ambulating;Independently Pt/caregiver will Perform Home Exercise Program: Increased strength;Right Upper extremity;With Supervision;With written HEP provided;With theraband (pt will also be supervision with coordination RUE exercises) Additional ADL Goal #1: Pt will be independent with functional mobility/ambulation  OT Frequency: Min 2X/week   Barriers to D/C: Decreased caregiver support    End of Session Activity Tolerance: Patient tolerated treatment well Patient left:  (with technician taking patient down for CT scan)   Time: 1046-1100 OT Time Calculation (min): 14 min Charges:  OT General Charges $OT Visit: 1 Procedure OT Evaluation $OT Eval Moderate Complexity: 1 Procedure  Edwin Cap , MS, OTR/L, CLT Pager: [phone]   02/25/2015, 11:13 AM

## 2015-02-25 NOTE — Progress Notes (Signed)
Physical Therapy Treatment Patient Details Name: [name] MRN: [id number] DOB: 09-14-1965 Today's Date: Mar 15, 2015    History of Present Illness pt is a 50 yo female adm to [facility] with difficulty texting and problems with expressing herself. PMH + for DM, CHF, chest pain, ? bipolar d/o per family report to MD. MRI shows Patchy multi focal acute nonhemorrhagic left MCA territory ischemic infarcts.    PT Comments    Pt doing well with mobility and no further PT needed.  Ready for dc from PT standpoint.    Follow Up Recommendations  No PT follow up     Equipment Recommendations  None recommended by PT    Recommendations for Other Services       Precautions / Restrictions Restrictions Weight Bearing Restrictions: No    Mobility  Bed Mobility Overal bed mobility: Modified Independent                Transfers Overall transfer level: Modified independent   Transfers: Sit to/from Stand Sit to Stand: Modified independent (Device/Increase time)            Ambulation/Gait Ambulation/Gait assistance: Modified independent (Device/Increase time) Ambulation Distance (Feet): 350 Feet Assistive device: None       General Gait Details: Steady gait. Able to perform head turns, quick stops and turns and pick up objects off of floor without problems   Stairs Stairs: Yes Stairs assistance: Modified independent (Device/Increase time) Stair Management: One rail Right;Alternating pattern;Forwards Number of Stairs: 5    Wheelchair Mobility    Modified Rankin (Stroke Patients Only) Modified Rankin (Stroke Patients Only) Pre-Morbid Rankin Score: No symptoms Modified Rankin: No significant disability     Balance     Sitting balance-Leahy Scale: Normal       Standing balance-Leahy Scale: Good                      Cognition Arousal/Alertness: Awake/alert Behavior During Therapy: WFL for tasks assessed/performed Overall Cognitive Status:  Impaired/Different from baseline Area of Impairment: Memory     Memory: Decreased short-term memory              Exercises      General Comments        Pertinent Vitals/Pain Pain Assessment: No/denies pain    Home Living                      Prior Function            PT Goals (current goals can now be found in the care plan section) Acute Rehab PT Goals PT Goal Formulation: With patient Time For Goal Achievement: 03/03/15 Potential to Achieve Goals: Good    Frequency  Min 4X/week    PT Plan Other (comment) (PT DC'd)    Co-evaluation             End of Session   Activity Tolerance: Patient tolerated treatment well Patient left: with call bell/phone within reach;in bed;with bed alarm set     Time: 1840-3754 PT Time Calculation (min) (ACUTE ONLY): 10 min  Charges:  $Gait Training: 8-22 mins                    G Codes:      [name] 2015-03-15, 3:16 PM [employer] PT [phone]

## 2015-02-25 NOTE — Care Management Note (Signed)
Case Management Note  Patient Details  Name: Regene Eapen MRN: [id number] Date of Birth: [birth year]  Subjective/Objective:   Date: Spoke with patient at the bedside.  Introduced self as Sports coach and explained role in discharge planning and how to be reached.  Verified patient lives in town with her daughter but her daughter will be moving out of her place and then the patient will have no where to stay, she requested to speak with CSW about a ALF list, NCM informed CSW of this information.  Expressed potential need for no other DME.  Verified patient anticipates to go home with daughter , but in a week she will have no where to stay.  Patient confirmed needing help with their medication.  Patient drives to MD appointments.  Verified patient will follow up with Family medicine at discharge. NCM gave patient re-lion information for glocometer , strips and insulin at [facility].  Patient may need Match Letter at discharge.    Plan: CM will continue to follow for discharge planning and [facility] resources.                  Action/Plan:   Expected Discharge Date:  02/23/15               Expected Discharge Plan:  Home/Self Care  In-House Referral:  Clinical Social Work  Discharge planning Services  CM Consult, Medication Assistance  Post Acute Care Choice:    Choice offered to:     DME Arranged:    DME Agency:     HH Arranged:    HH Agency:     Status of Service:  In process, will continue to follow  Medicare Important Message Given:    Date Medicare IM Given:    Medicare IM give by:    Date Additional Medicare IM Given:    Additional Medicare Important Message give by:     If discussed at Long Length of Stay Meetings, dates discussed:    Additional Comments:  Leone Haven, RN 02/25/2015, 5:05 PM

## 2015-02-25 NOTE — Progress Notes (Signed)
STROKE TEAM PROGRESS NOTE   HISTORY [name] is an 50 y.o. female who was brought to ED due to confusion. Per family she was tired all day yesterday. Sh e called her boyfriend who noted she was talking normally. She arrived at his house and was noted not to be able to text or express herself. She feels she has improved slightly in the fact she can express herself better but still having some difficulty. She denies HTN, hypercoagulable states, sickle cell. She has no other symptoms. She denies taking ASA. On arrival BP was 182/127--currently 128/79. LDL 192. She was last known well 02/23/2015 at 11:00. Patient was not administered TPA secondary to out of window. She was admitted for further evaluation and treatment.   SUBJECTIVE (INTERVAL HISTORY) Her echo techs are at the bedside.  Overall she feels her condition is stable. She denies recent trauma, neck injury or motor vehicle accident..    OBJECTIVE Temp:  [97.7 F (36.5 C)-98.3 F (36.8 C)] 97.7 F (36.5 C) (01/13 0558) Pulse Rate:  [99-118] 99 (01/13 0558) Cardiac Rhythm:  [-] Normal sinus rhythm (01/13 0700) Resp:  [16] 16 (01/13 0558) BP: (117-139)/(71-93) 117/71 mmHg (01/13 0558) SpO2:  [100 %] 100 % (01/13 0558)  CBC:   Recent Labs Lab 02/23/15 2115 02/24/15 0540  WBC 8.2 7.6  HGB 14.2 13.4  HCT 42.1 39.8  MCV 86.3 86.7  PLT 348 278    Basic Metabolic Panel:   Recent Labs Lab 02/23/15 1540 02/23/15 2115 02/24/15 0540  NA 133*  --  135  K 4.0  --  3.7  CL 97*  --  104  CO2 22  --  25  GLUCOSE 366*  --  287*  BUN 9  --  6  CREATININE 0.76 0.72 0.62  CALCIUM 9.9  --  8.3*  MG  --  1.5* 1.7    Lipid Panel:     Component Value Date/Time   CHOL 258* 02/24/2015 0540   TRIG 164* 02/24/2015 0540   HDL 33* 02/24/2015 0540   CHOLHDL 7.8 02/24/2015 0540   VLDL 33 02/24/2015 0540   LDLCALC 192* 02/24/2015 0540   HgbA1c:  Lab Results  Component Value Date   HGBA1C 7.6* 05/19/2011   Urine Drug  Screen:     Component Value Date/Time   LABOPIA NONE DETECTED 02/23/2015 1900   COCAINSCRNUR NONE DETECTED 02/23/2015 1900   LABBENZ NONE DETECTED 02/23/2015 1900   AMPHETMU NONE DETECTED 02/23/2015 1900   THCU NONE DETECTED 02/23/2015 1900   LABBARB NONE DETECTED 02/23/2015 1900      IMAGING  Dg Chest 2 View 02/24/2015   No active cardiopulmonary disease.   Ct Head Wo Contrast 02/23/2015   Negative head CT.   Ct Angio Head and Neck W/cm &/or Wo/cm 02/25/2015   1. 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 This is nonocclusive but likely limiting flow. 2. Asymmetric attenuation of left MCA branch vessels compared to the right. This likely reflects hypoperfusion. 3. Mild irregularity in the cavernous internal carotid arteries bilaterally and at the right carotid bifurcation without significant stenosis. 4. Focal endplate degenerative change and uncovertebral spurring at C5-6 with mild foraminal narrowing bilaterally.   Mr Lodema Pilot Contrast 02/24/2015   1. Patchy multi focal acute nonhemorrhagic left MCA territory ischemic infarcts as above. These are somewhat in a watershed distribution. Further imaging of the arterial vasculature of the head and neck recommended  to evaluate for proximal stenosis. Additionally, query recent hypotensive episode. 2. Probable tiny remote bilateral cerebellar infarcts. 3. Otherwise normal brain MRI.   Carotid Doppler   There is 1-39% bilateral ICA stenosis. Vertebral artery flow is antegrade.     PHYSICAL EXAM Pleasant middle-aged african american lady not in distress. . Afebrile. Head is nontraumatic. Neck is supple without bruit.    Cardiac exam no murmur or gallop. Lungs are clear to auscultation. Distal pulses are well felt. Neurological Exam :   Awake  Alert oriented x 3. Normal speech and language.eye movements full without nystagmus.fundi were not visualized. Vision acuity  and fields appear normal. Hearing is normal. Palatal movements are normal. Face symmetric. Tongue midline. Normal strength, tone, reflexes and coordination. Normal sensation. Gait deferred. ASSESSMENT/PLAN [name] is a 51 y.o. female with history of HTN, DB, NICM, CHF presenting with confusion and speech anormality . She did not receive IV t-PA due to delay in arrival.   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 Continue dual antiplatelets at dischage  Patient counseled to be compliant with her antithrombotic medications  Ongoing aggressive stroke risk factor management  Therapy recommendations:  No therapy needs  Disposition:  Return home  Hypertension  Stable Permissive hypertension (OK if < 220/120) but gradually normalize in 5-7 days  Hyperlipidemia  Home meds:  No statin  LDL 192, goal < 70  New lipitor 80 added this admission  Continue statin at discharge  Diabetes  HgbA1c pending , goal < 7.0  Other Stroke Risk Factors  Obesity, Body mass index is 36.98 kg/(m^2).   Other Active Problems  Non-ischemic Cardiomyopathy, EF 35-40%  Systolic CHF  Hospital day # 2  Rhoderick Moody [facility] See Amion for Pager information 02/25/2015 4:52 PM  I have personally examined this patient, reviewed notes, independently viewed imaging studies, participated in medical decision making and plan of care. I have made any additions or clarifications directly to the above note.  Agree with note above. She presented with confusion and speech difficulties which appear to have improved. 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 She remains at risk for neurological worsening, recurrent stroke, TIA and needs ongoing stroke evaluation. Recommend aspirin and Plavix for 3 months followed by aspirin alone. Patient also needs statin. She expressed interest in participation in the stroke A. fib trial and I gave her information about this and answered her questions. She'll be given more detailed information by the study coordinator prior to deciding if she wants to participate. No study specific procedures were done. prior to signing consent form  Delia Heady, MD Medical Director [facility] Pager: [phone] 02/25/2015 5:35 PM   To contact Stroke Continuity provider, please refer to [email or web address]. After hours, contact General Neurology

## 2015-02-25 NOTE — Progress Notes (Addendum)
Inpatient Diabetes Program Recommendations  AACE/ADA: New Consensus Statement on Inpatient Glycemic Control (2015)  Target Ranges:  Prepandial:   less than 140 mg/dL      Peak postprandial:   less than 180 mg/dL (1-2 hours)      Critically ill patients:  140 - 180 mg/dL   Review of Glycemic Control:  Results for CABRINA, SHIROMA (MRN [id number]) as of 02/25/2015 14:31  Ref. Range 02/24/2015 11:44 02/24/2015 16:59 02/24/2015 21:23 02/25/2015 07:04 02/25/2015 12:14  Glucose-Capillary Latest Ref Range: 65-99 mg/dL 284 (H) 283 (H) 254 (H) 300 (H) 280 (H)   Diabetes history: Type 2 diabetes Outpatient Diabetes medications: Metformin 1000 mg daily, Current orders for Inpatient glycemic control: Novolog moderate tid with meals and HS, Lantus 10 units daily  Inpatient Diabetes Program Recommendations:    Spoke with patient regarding home diabetes management.  Note that she was not taking diabetes medications prior to admit.  She has never been on insulin before.  Discussed that A1C had been drawn and that this would give a better reflection of glycemic control over past 3 months.  Patient sleepy however states she is okay with insulin at home if necessary. Note MD plans for Novolin 70/30?  Will order insulin starter kit.  Explained to patient that bedside RN will teach insulin administration just in case it is needed at home.    Thanks, Adah Perl, RN, BC-ADM Inpatient Diabetes Coordinator Pager [phone] (8a-5p)

## 2015-02-25 NOTE — Progress Notes (Signed)
In  For CTA anxious and prn given

## 2015-02-25 NOTE — Progress Notes (Signed)
  Echocardiogram 2D Echocardiogram has been performed.  Leta Jungling M 02/25/2015, 3:33 PM

## 2015-02-26 DIAGNOSIS — I779 Disorder of arteries and arterioles, unspecified: Secondary | ICD-10-CM

## 2015-02-26 LAB — HEMOGLOBIN A1C
Hgb A1c MFr Bld: 13.2 % — ABNORMAL HIGH (ref 4.8–5.6)
MEAN PLASMA GLUCOSE: 332 mg/dL

## 2015-02-26 LAB — GLUCOSE, CAPILLARY
GLUCOSE-CAPILLARY: 240 mg/dL — AB (ref 65–99)
GLUCOSE-CAPILLARY: 315 mg/dL — AB (ref 65–99)
Glucose-Capillary: 216 mg/dL — ABNORMAL HIGH (ref 65–99)

## 2015-02-26 MED ORDER — BLOOD GLUCOSE MONITOR KIT: PACK | Status: DC

## 2015-02-26 MED ORDER — LISINOPRIL 40 MG PO TABS: 40.0000 mg | ORAL_TABLET | Freq: Every day | ORAL | Status: DC

## 2015-02-26 MED ORDER — ASPIRIN 81 MG PO CHEW: 81.0000 mg | CHEWABLE_TABLET | Freq: Every day | ORAL | Status: DC

## 2015-02-26 MED ORDER — ATORVASTATIN CALCIUM 80 MG PO TABS: 80.0000 mg | ORAL_TABLET | Freq: Every day | ORAL | Status: DC

## 2015-02-26 MED ORDER — INSULIN NPH ISOPHANE & REGULAR (70-30) 100 UNIT/ML ~~LOC~~ SUSP: SUBCUTANEOUS | Status: DC

## 2015-02-26 MED ORDER — CARVEDILOL 12.5 MG PO TABS: 25.0000 mg | ORAL_TABLET | Freq: Two times a day (BID) | ORAL | Status: DC

## 2015-02-26 MED ORDER — METFORMIN HCL 1000 MG PO TABS: 500.0000 mg | ORAL_TABLET | Freq: Two times a day (BID) | ORAL | Status: DC

## 2015-02-26 MED ORDER — FUROSEMIDE 20 MG PO TABS: 20.0000 mg | ORAL_TABLET | Freq: Every day | ORAL | Status: DC

## 2015-02-26 MED ORDER — CLOPIDOGREL BISULFATE 75 MG PO TABS: 75.0000 mg | ORAL_TABLET | Freq: Every day | ORAL | Status: DC

## 2015-02-26 MED ORDER — INSULIN SYRINGES (DISPOSABLE) U-100 0.3 ML MISC: 1.0000 | Freq: Two times a day (BID) | Status: DC

## 2015-02-26 NOTE — Progress Notes (Signed)
STROKE TEAM PROGRESS NOTE   HISTORY [name] is an 50 y.o. female who was brought to ED due to confusion. Per family she was tired all day yesterday. She called her boyfriend who noted she was talking normally. She arrived at his house and was noted not to be able to text or express herself. She feels she has improved slightly in the fact she can express herself better but still having some difficulty. She denies HTN, hypercoagulable states, sickle cell. She has no other symptoms. She denies taking ASA. On arrival BP was 182/127--currently 128/79. LDL 192. She was last known well 02/23/2015 at 11:00. Patient was not administered TPA secondary to out of window. She was admitted for further evaluation and treatment.   SUBJECTIVE (INTERVAL HISTORY) Overall she feels her condition is stable. She denies recent trauma, neck injury or motor vehicle accident.  She looks forward to discharge   OBJECTIVE Temp:  [97.8 F (36.6 C)-98.1 F (36.7 C)] 98 F (36.7 C) (01/14 0537) Pulse Rate:  [103-110] 110 (01/14 0537) Cardiac Rhythm:  [-] Sinus tachycardia (01/14 0700) Resp:  [18] 18 (01/14 0537) BP: (93-118)/(63-102) 118/102 mmHg (01/14 0537) SpO2:  [96 %-99 %] 97 % (01/14 0537)  CBC:   Recent Labs Lab 02/23/15 2115 02/24/15 0540  WBC 8.2 7.6  HGB 14.2 13.4  HCT 42.1 39.8  MCV 86.3 86.7  PLT 348 278    Basic Metabolic Panel:   Recent Labs Lab 02/23/15 1540 02/23/15 2115 02/24/15 0540  NA 133*  --  135  K 4.0  --  3.7  CL 97*  --  104  CO2 22  --  25  GLUCOSE 366*  --  287*  BUN 9  --  6  CREATININE 0.76 0.72 0.62  CALCIUM 9.9  --  8.3*  MG  --  1.5* 1.7    Lipid Panel:     Component Value Date/Time   CHOL 258* 02/24/2015 0540   TRIG 164* 02/24/2015 0540   HDL 33* 02/24/2015 0540   CHOLHDL 7.8 02/24/2015 0540   VLDL 33 02/24/2015 0540   LDLCALC 192* 02/24/2015 0540   HgbA1c:  Lab Results  Component Value Date   HGBA1C 13.2* 02/23/2015   Urine Drug Screen:      Component Value Date/Time   LABOPIA NONE DETECTED 02/23/2015 1900   COCAINSCRNUR NONE DETECTED 02/23/2015 1900   LABBENZ NONE DETECTED 02/23/2015 1900   AMPHETMU NONE DETECTED 02/23/2015 1900   THCU NONE DETECTED 02/23/2015 1900   LABBARB NONE DETECTED 02/23/2015 1900      IMAGING  Dg Chest 2 View 02/24/2015    No active cardiopulmonary disease.   Ct Head Wo Contrast 02/23/2015    Negative head CT.   Ct Angio Head and Neck W/cm &/or Wo/cm 02/25/2015    1. 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 This is nonocclusive but likely limiting flow.  2. Asymmetric attenuation of left MCA branch vessels compared to the right. This likely reflects hypoperfusion.  3. Mild irregularity in the cavernous internal carotid arteries bilaterally and at the right carotid bifurcation without significant stenosis.  4. Focal endplate degenerative change and uncovertebral spurring at C5-6 with mild foraminal narrowing bilaterally.    Mr Lodema Pilot Contrast 02/24/2015    1. Patchy multi focal acute nonhemorrhagic left MCA territory ischemic infarcts as above. These are somewhat in a watershed distribution. Further imaging of the arterial vasculature of the  head and neck recommended to evaluate for proximal stenosis. Additionally, query recent hypotensive episode.  2. Probable tiny remote bilateral cerebellar infarcts. 3. Otherwise normal brain MRI.   Carotid Doppler   There is 1-39% bilateral ICA stenosis. Vertebral artery flow is antegrade.    PHYSICAL EXAM Pleasant middle-aged african american lady not in distress. . Afebrile. Head is nontraumatic. Neck is supple without bruit.    Cardiac exam no murmur or gallop. Lungs are clear to auscultation. Distal pulses are well felt.  Neurological Exam :   Awake  Alert oriented x 3. Normal speech and language.eye movements full without nystagmus.fundi were not visualized.  Vision acuity and fields appear normal. Hearing is normal. Palatal movements are normal. Face symmetric. Tongue midline. Normal strength, tone, reflexes and coordination. Normal sensation. Gait deferred.  ASSESSMENT/PLAN [name] is a 50 y.o. female with history of HTN, DM, NICM, CHF presenting with confusion and speech anormality . She did not receive IV t-PA due to delay in arrival.   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 No cardiac source of emboli identified.  LDL 192  HgbA1c 13.2  Heparin 5000 units sq tid for VTE prophylaxis Diet heart healthy/carb modified Room service appropriate?: Yes; Fluid consistency:: Thin  No antithrombotic prior to admission, now on aspirin 81 mg daily and clopidogrel 75 mg daily. Continue dual antiplatelets at dischage  Patient counseled to be compliant with her antithrombotic medications  Ongoing aggressive stroke risk factor management  Therapy recommendations:  No therapy needs  Disposition:  Return home  Hypertension  Stable  Permissive hypertension (OK if < 220/120) but gradually normalize in 5-7 days  Hyperlipidemia  Home meds:  No statin  LDL 192, goal < 70  New lipitor 80 added this admission  Continue statin at discharge  Diabetes  HgbA1c 13.2 , goal < 7.0  Other Stroke Risk Factors  Obesity, Body mass index is 36.98 kg/(m^2).   Other Active Problems  Non-ischemic Cardiomyopathy, EF 35-40%  Systolic CHF  Hospital day # 3  [facility] See Amion for Pager information 02/26/2015 7:59 AM    ATTENDING NOTE: Patient was seen and examined by me personally. Documentation reflects findings. The laboratory and radiographic studies reviewed by me. ROS  completed by me personally and pertinent positives fully documented   Condition:  Stable  Assessment and plan completed by me personally and fully documented above. Plans/Recommendations include:     Patient will need diabetes education  Patient will need hyperlipidemia education  Patient will need to follow with Dr. Pearlean Brownie as outpatient within 6 weeks after discharge  SIGNED BY: Dr. Sula Soda     To contact Stroke Continuity provider, please refer to [email or web address]. After hours, contact General Neurology

## 2015-02-26 NOTE — Progress Notes (Signed)
CM spoke to [facility] regarding d/c plan. 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 CM to f/u with disposition needs. Gae Gallop RN,BSN,CM [phone]

## 2015-02-26 NOTE — Discharge Instructions (Signed)
It has been a pleasure taking care of you! You were admitted due to stroke. You symptoms have resolved. We have started you on new medications. We are discharging you on more of this medication and other additional medications that you need to continue taking after you leave the hospital. We may have also made some adjustments to your other medications. Please, make sure to read the directions before you take them. The names and directions on how to take these medications are found on this discharge paper under medication section.  You also need a follow up with your primary care doctor. The address, date and time are found on the discharge paper under follow up section.  Below, you can find some reading about stroke.  Take care,

## 2015-02-26 NOTE — Progress Notes (Signed)
Family Medicine Teaching Service Daily Progress Note Intern Pager: [phone]  Patient name: [name] Medical record number: [id number] Date of birth: 01/06/1966 Age: 49 y.o. Gender: female  Primary Care Provider: Jearld Lesch, MD Consultants: neurology Code Status: DNR  Pt Overview and Major Events to Date:  01/11-Admitted with aphasia. MRI with with stroke on left MCA teritory  Assessment and Plan: [name] is a 50 y.o. female presenting with altered mental status . PMH is significant for Diastolic CHF, HTN, DM2, Nonischemic cardiomyopathy  Acute Encephalopathy from CVA: improved.  CT head negative for acute processes. MRI brain Patchy multi focal acute nonhemorrhagic left MCA territory ischemic infarcts in a watershed distribution and probable tiny remote bilateral cerebellar infarcts. Hypertensive to 180/120s in ED. Glucose elevated to 366 but ABG with mild resp alkalosis. LFTs and ammonia normal. UDS, tylenol and salicylate  negative.Afebrile. No leukocytosis. No meningeal signs. Carboxyhemoglobin slightly elevated to 1.7. Methemoglobin and TSH within normal range. Lipid panel with LDL of 192 and HDL of 33. CTA with L CCA nonocclusive flow limiting dissection, about 4 cm. Echo with EF of 35-40% and abnormal GLPSS at -11%, with anterior and anteroseptal regionality. - Appreciate neuro recs:   -Dual antiplatelet for three months, then ASA indefinitely. - Card consulted for Holter monitor. Will reconsult for echo finding as patient is now interested in ICD if appropriate. -consulted vascular surgery for dissection. - SLP/PT/OT-no physical therapy follow up. - Neuro check and vital signs every 4 hours - Permissive HTN to 180/110 for now, gently treat if over 220/120. Normalize over 5-7 days - Atorvastatin/Plavix/ASA once she pass bedside swallow test - Follow up at [facility] practice  Type 2 Diabetes: BG 366 on admission, 240 this morning. A1c 13.2. Not on medications out  patient - Continue Lantus 10 units - will change SSI to moderate. - NPH 70/30 on discharge  HTN: elevated on admission to 182/127. In 118/102 this morning. - Monitor for hypotension - manage hypertension as above  HFrEF/ NICM: Echo EF of 35-40% and abnormal GLPSS at -11%, with anterior and anteroseptal regionality. 05/2011 cardiac cath with normal coronary arteries & EF of 35-40%. She was referred for ICD placement but decided not to pursue. Last cardiology visit 10/2012, echo done at that time but then patient lost to follow up. In ED, iTrop neg. No CP. EKG: sinus tach with nonspecific TW abnormalities unremarkable.Repeat EKG with TWI in lateral leads. No evidence of fluid overload on exam.Appears euvolimic of exam. - Troponin: neg x1 - Needs to get back on CHF meds before discharge.  FEN/GI:  Heart healthy carb modified diet. Prophylaxis: sub-q heparin  Disposition: home pending card recommendation about her Echo finding.  Subjective: No acute event over night. Denies weakness, tingling, numbness, dizziness, vision changes or pain. Willing to consider ICD if cardiology recommends. She declined in the past.   Objective: Temp:  [97.8 F (36.6 C)-98.1 F (36.7 C)] 98 F (36.7 C) (01/14 0537) Pulse Rate:  [103-110] 110 (01/14 0537) Resp:  [18] 18 (01/14 0537) BP: (93-118)/(63-102) 118/102 mmHg (01/14 0537) SpO2:  [96 %-99 %] 97 % (01/14 0537) Physical Exam: Gen: appears well, no distress, sitting on the edge of the bed. CV: regular rate and rythm. S1 & S2 audible Resp: no apparent work of breathing, clear to auscultation bilaterally. GI: bowel sounds normal, no tenderness to palpation Skin: no lesion Neuro: AAOx4, CN II-XII intact, motor 5/5 in all muscle groups, sensation intact, finger to nose and heel on shin normal, babinski  negative, no ddk  Laboratory:  Recent Labs Lab 02/23/15 1540 02/23/15 2115 02/24/15 0540  WBC 8.4 8.2 7.6  HGB 16.3* 14.2 13.4  HCT 47.8*  42.1 39.8  PLT 329 348 278    Recent Labs Lab 02/23/15 1540 02/23/15 2115 02/24/15 0540  NA 133*  --  135  K 4.0  --  3.7  CL 97*  --  104  CO2 22  --  25  BUN 9  --  6  CREATININE 0.76 0.72 0.62  CALCIUM 9.9  --  8.3*  PROT 8.3*  --   --   BILITOT 0.9  --   --   ALKPHOS 122  --   --   ALT 30  --   --   AST 27  --   --   GLUCOSE 366*  --  287*    Imaging/Diagnostic Tests: Ct Head Wo Contrast Negative head CT. Electronically Signed   By: Drusilla Kanner M.D.   On: 02/23/2015 16:08   Mr Brain W Wo Contrast Patchy multi focal acute nonhemorrhagic left MCA territory ischemic infarcts as above. These are somewhat in a watershed distribution. Further imaging of the arterial vasculature of the head and neck recommended to evaluate for proximal stenosis. Additionally, query recent hypotensive episode. 2. Probable tiny remote bilateral cerebellar infarcts. 3. Otherwise normal brain MRI. Electronically Signed   By: Rise Mu M.D.   On: 02/24/2015 06:33    Almon Hercules, MD 02/26/2015, 8:28 AM PGY-1, [place] [facility] FPTS Intern pager: [phone], text pages welcome

## 2015-02-26 NOTE — Progress Notes (Signed)
   Daily Progress Note  Briefly reviewed this patient's CTA, the patient has L CCA lesion read by radiology as a dissection.    - No immediate surgical intervention necessary - Anticoagulation for 6 months - Repeat CTA in 6 months - Full consult to follow later today   Leonides Sake, MD [facility] Office: [phone] Pager: [phone]  02/26/2015, 2:17 PM

## 2015-02-26 NOTE — Progress Notes (Signed)
D/c to home via W/C d/c instructions given as well as all belongings voices no c/o

## 2015-02-26 NOTE — Progress Notes (Signed)
waiting for ride

## 2015-02-27 NOTE — Discharge Summary (Signed)
[facility] Discharge Summary  Patient name: [name] Medical record number: [id number] Date of birth: 07/24/1965 Age: 50 y.o. Gender: female Date of Admission: 02/23/2015  Date of Discharge: 02/26/2015 Admitting Physician: Leeanne Rio, MD  Primary Care Provider: Harvie Junior, MD Consultants: neurology, vascular surgery, cardiology  Indication for Hospitalization: stroke  Discharge Diagnoses/Problem List:  CVA, hypertension, diabetes mellitus, hyperlipidemia, HFrEF, Lt CCA dissection  Disposition: home  Discharge Condition: stable  Discharge Exam:  Temp: [97.8 F (36.6 C)-98.1 F (36.7 C)] 98 F (36.7 C) (01/14 0537) Pulse Rate: [103-110] 110 (01/14 0537) Resp: [18] 18 (01/14 0537) BP: (93-118)/(63-102) 118/102 mmHg (01/14 0537) SpO2: [96 %-99 %] 97 % (01/14 0537)  Physical Exam: Gen: appears well, no distress, sitting on the edge of the bed. CV: regular rate and rythm. S1 & S2 audible Resp: no apparent work of breathing, clear to auscultation bilaterally. GI: bowel sounds normal, no tenderness to palpation Skin: no lesion Neuro: AAOx4, CN II-XII intact, motor 5/5 in all muscle groups, sensation intact, finger to nose and heel on shin normal, babinski negative, no ddk  Brief Hospital Course:  [name] is a 50 y.o. female presenting with altered mental status and found to have CCVA. PMH is significant for Diastolic CHF, HTN, DM2, Nonischemic cardiomyopathy  Acute Encephalopathy from CVA: resolved.patient with AMS and aphasia on presentation. Also hypertensive to 180/120s in ED. CT head negative for acute processes. MRI brain with patchy multi focal acute nonhemorrhagic left MCA territory ischemic infarcts in a watershed distribution and probable tiny remote bilateral cerebellar infarcts. She was outside tPA window.  Lipid panel with LDL of 192 and HDL of 33. Neurology consulted and ordered CTA head and neck that showed  left common carotid artery nonocclusive flow limiting dissection, about 4 cm. Vascular surgery consulted. After reviewing the CTA they didn't feel immediate surgical intervention is necessary. They recommended anticoagulation for 6 months and repeat CTA in 6 months. However, patient on dual antiplatelets which is as effective as anticoagulation per [facility] guideline. So, we discharged patient on dual antiplatelet. She will continue this for 6 months (3 months for stroke and 6 months for dissection), then ASA indefinitely.  Holter monitor to be arranged as outpatient per cardiology. Prior to discharge, patient was evaluated by SLP/PT/OT and didn't need follow up therapy.  See below for detail on discharge medications.  Type 2 Diabetes: BG 366 on admission. A1c 13.2. Not on medications before admission. Started on Lantus 10 units and moderate SSI while in house. She was discharged on metformin and Novolin 70/30. Recommend checking POCT glucose at follow up.  HTN: elevated on admission to 182/127. Blood pressure medications were held for permissive hypertension , and resumed with additional medications on discharge. Recommend checking Cr and K at follow up  HLD: Lipid panel with LDL of 192 and HDL of 33. She was discharged on Atorvastatin 80 mg. Recommend LFT at follow up.  HFrEF/ NICM: euvolimic of exam. No chest pain or shortness of breath. Trop negative.EKG with sinus tach and nonspecific TW abnormalities.Repeat EKG with TWI in lateral leads. Echo EF of 35-40% and abnormal GLPSS at -11%, with anterior and anteroseptal regionality. In 05/2011, LHC with normal coronary arteries & EF of 35-40%. She was referred for ICD placement but decided not to pursue.Last cardiology visit 10/2012, echo done at that time but then patient lost to follow up. She is willing to have ICD this time if indicated. Cardiology was consulted again and recommended  getting this as outpatient after stabilization of stroke. She was  discharged on ACEi and BB. Aldactone was held for time being.  Issues for Follow Up:  1. Stroke: assess neuro changes and compliance with medication. Follow up with cardiology for Holter monitor and evaluation for ICD. 2. Hypertension: check BP. She should be at goal per JNC 8 guideline.  3. HFrEF: EF 35-40%. Assess NYHA stage and consider resuming aldactone if appropriate. 4. Diabetes Mellitus: assess tolerance and compliance. Patient to bring in BG log. Check POCT glucose 5. Hyperlipidemia: started on atorvastatin 80 mg.  6. Check CMP (LFT for atorvastatin, Cr and K for ACEi)  Significant Procedures: none  Significant Labs and Imaging:   Recent Labs Lab 02/23/15 1540 02/23/15 2115 02/24/15 0540  WBC 8.4 8.2 7.6  HGB 16.3* 14.2 13.4  HCT 47.8* 42.1 39.8  PLT 329 348 278    Recent Labs Lab 02/23/15 1540 02/23/15 2115 02/24/15 0540  NA 133*  --  135  K 4.0  --  3.7  CL 97*  --  104  CO2 22  --  25  GLUCOSE 366*  --  287*  BUN 9  --  6  CREATININE 0.76 0.72 0.62  CALCIUM 9.9  --  8.3*  MG  --  1.5* 1.7  ALKPHOS 122  --   --   AST 27  --   --   ALT 30  --   --   ALBUMIN 4.1  --   --     Results/Tests Pending at Time of Discharge: none  Discharge Medications:    Medication List    STOP taking these medications        ibuprofen 200 MG tablet  Commonly known as:  ADVIL,MOTRIN     potassium chloride SA 20 MEQ tablet  Commonly known as:  K-DUR,KLOR-CON     spironolactone 25 MG tablet  Commonly known as:  ALDACTONE      TAKE these medications        albuterol 108 (90 Base) MCG/ACT inhaler  Commonly known as:  PROVENTIL HFA;VENTOLIN HFA  Inhale 1 puff into the lungs every 6 (six) hours as needed for wheezing or shortness of breath.     aspirin 81 MG chewable tablet  Chew 1 tablet (81 mg total) by mouth daily.     atorvastatin 80 MG tablet  Commonly known as:  LIPITOR  Take 1 tablet (80 mg total) by mouth daily at 6 PM.     blood glucose meter kit and  supplies Kit  Dispense based on patient and insurance preference. Use up to four times daily as directed. (FOR ICD-9 250.00, 250.01).     carvedilol 12.5 MG tablet  Commonly known as:  COREG  Take 2 tablets (25 mg total) by mouth 2 (two) times daily with a meal.     clopidogrel 75 MG tablet  Commonly known as:  PLAVIX  Take 1 tablet (75 mg total) by mouth daily.     furosemide 20 MG tablet  Commonly known as:  LASIX  Take 1 tablet (20 mg total) by mouth daily. If short of breath or leg swelling     insulin NPH-regular Human (70-30) 100 UNIT/ML injection  Commonly known as:  NOVOLIN 70/30  Inject 15 units in the AM, inject 7 units in PM 12 hours later 10 minutes before meals     Insulin Syringes (Disposable) U-100 0.3 ML Misc  1 Syringe by Does not apply route 2 (two)  times daily.     lisinopril 40 MG tablet  Commonly known as:  PRINIVIL,ZESTRIL  Take 1 tablet (40 mg total) by mouth daily.     metFORMIN 1000 MG tablet  Commonly known as:  GLUCOPHAGE  Take 0.5 tablets (500 mg total) by mouth 2 (two) times daily with a meal. Reported on 02/23/2015        Discharge Instructions: Please refer to Patient Instructions section of EMR for full details.  Patient was counseled important signs and symptoms that should prompt return to medical care, changes in medications, dietary instructions, activity restrictions, and follow up appointments.   Follow-Up Appointments:     Follow-up Information    Follow up with Mercy Riding, MD On 03/11/2015.   Specialty:  Family Medicine   Why:  at 2:30 pm. Hosp follow up on stroke   Contact information:   [place] [address] [phone]       Please follow up.   Why:  at 2:30 pm. Hosp f/u on stroke      Mercy Riding, MD 02/27/2015, 8:08 PM PGY-1, [place]

## 2015-02-28 ENCOUNTER — Telehealth: Payer: Self-pay | Admitting: Specialist

## 2015-02-28 NOTE — Telephone Encounter (Signed)
Pt called and would like to speak to [place] today. She is going to be a New Patient at the end of the month and has issues getting any of her medications. She does not have any insurance and will be seeing if she qualify's for FA. jw

## 2015-03-03 NOTE — Telephone Encounter (Signed)
CSW contacted pt to explore needs. Pt states she is scheduled for an appt. Next week and will not have a way to pay for her medications. CSW provided pt with information for the health department and encouraged her to call and schedule an appt. Next week after her doctors appt. Pt is also interested in resources for housing. CSW will mail pt a list of affordable housing apartments, pt appreciative.  Resources have been mailed.  Theresia Bough, MSW, LCSW [phone]

## 2015-03-04 NOTE — Progress Notes (Signed)
NCM received call today patient did not get her Match letter on the weekend when she was discharged, so she has not been able to get meds.  NCM left handoff to Weekend Case Manager that patient needed Match for possible weekend discharge.  This NCM faxed Match letter to [facility] pharmacy for patient today.

## 2015-03-09 ENCOUNTER — Ambulatory Visit: Payer: Self-pay

## 2015-03-11 ENCOUNTER — Inpatient Hospital Stay: Payer: Self-pay | Admitting: Student

## 2015-03-13 ENCOUNTER — Emergency Department (HOSPITAL_COMMUNITY): Payer: Medicaid Other

## 2015-03-13 ENCOUNTER — Encounter (HOSPITAL_COMMUNITY): Payer: Self-pay

## 2015-03-13 ENCOUNTER — Emergency Department (HOSPITAL_COMMUNITY)
Admission: EM | Admit: 2015-03-13 | Discharge: 2015-03-13 | Disposition: A | Discharge status: Home / Self Care | Payer: Medicaid Other | Attending: Emergency Medicine | Admitting: Emergency Medicine

## 2015-03-13 DIAGNOSIS — Z7982 Long term (current) use of aspirin: Secondary | ICD-10-CM | POA: Insufficient documentation

## 2015-03-13 DIAGNOSIS — J189 Pneumonia, unspecified organism: Secondary | ICD-10-CM

## 2015-03-13 DIAGNOSIS — Z79899 Other long term (current) drug therapy: Secondary | ICD-10-CM | POA: Diagnosis not present

## 2015-03-13 DIAGNOSIS — E119 Type 2 diabetes mellitus without complications: Secondary | ICD-10-CM | POA: Diagnosis not present

## 2015-03-13 DIAGNOSIS — I10 Essential (primary) hypertension: Secondary | ICD-10-CM | POA: Diagnosis not present

## 2015-03-13 DIAGNOSIS — J159 Unspecified bacterial pneumonia: Secondary | ICD-10-CM | POA: Diagnosis not present

## 2015-03-13 DIAGNOSIS — R05 Cough: Secondary | ICD-10-CM | POA: Diagnosis present

## 2015-03-13 DIAGNOSIS — Z7902 Long term (current) use of antithrombotics/antiplatelets: Secondary | ICD-10-CM | POA: Insufficient documentation

## 2015-03-13 DIAGNOSIS — Z794 Long term (current) use of insulin: Secondary | ICD-10-CM | POA: Diagnosis not present

## 2015-03-13 DIAGNOSIS — I5020 Unspecified systolic (congestive) heart failure: Secondary | ICD-10-CM | POA: Insufficient documentation

## 2015-03-13 LAB — CBC WITH DIFFERENTIAL/PLATELET
BASOS PCT: 0 %
Basophils Absolute: 0 10*3/uL (ref 0.0–0.1)
EOS ABS: 0.1 10*3/uL (ref 0.0–0.7)
EOS PCT: 1 %
HCT: 40.4 % (ref 36.0–46.0)
Hemoglobin: 13.4 g/dL (ref 12.0–15.0)
LYMPHS ABS: 2.2 10*3/uL (ref 0.7–4.0)
Lymphocytes Relative: 14 %
MCH: 29.1 pg (ref 26.0–34.0)
MCHC: 33.2 g/dL (ref 30.0–36.0)
MCV: 87.8 fL (ref 78.0–100.0)
Monocytes Absolute: 0.4 10*3/uL (ref 0.1–1.0)
Monocytes Relative: 2 %
Neutro Abs: 12.8 10*3/uL — ABNORMAL HIGH (ref 1.7–7.7)
Neutrophils Relative %: 82 %
PLATELETS: 384 10*3/uL (ref 150–400)
RBC: 4.6 MIL/uL (ref 3.87–5.11)
RDW: 13.1 % (ref 11.5–15.5)
WBC: 15.6 10*3/uL — AB (ref 4.0–10.5)

## 2015-03-13 LAB — BASIC METABOLIC PANEL
Anion gap: 9 (ref 5–15)
BUN: 15 mg/dL (ref 6–20)
CALCIUM: 8.6 mg/dL — AB (ref 8.9–10.3)
CHLORIDE: 101 mmol/L (ref 101–111)
CO2: 25 mmol/L (ref 22–32)
CREATININE: 0.78 mg/dL (ref 0.44–1.00)
Glucose, Bld: 346 mg/dL — ABNORMAL HIGH (ref 65–99)
Potassium: 4 mmol/L (ref 3.5–5.1)
SODIUM: 135 mmol/L (ref 135–145)

## 2015-03-13 LAB — BRAIN NATRIURETIC PEPTIDE: B NATRIURETIC PEPTIDE 5: 108.5 pg/mL — AB (ref 0.0–100.0)

## 2015-03-13 LAB — TROPONIN I

## 2015-03-13 MED ORDER — ALBUTEROL SULFATE (2.5 MG/3ML) 0.083% IN NEBU
5.0000 mg | INHALATION_SOLUTION | Freq: Once | RESPIRATORY_TRACT | Status: AC
  Administered 2015-03-13: 5 mg via RESPIRATORY_TRACT
  Filled 2015-03-13: qty 6

## 2015-03-13 MED ORDER — IPRATROPIUM BROMIDE 0.02 % IN SOLN
0.5000 mg | Freq: Once | RESPIRATORY_TRACT | Status: AC
  Administered 2015-03-13: 0.5 mg via RESPIRATORY_TRACT
  Filled 2015-03-13: qty 2.5

## 2015-03-13 MED ORDER — ALBUTEROL SULFATE HFA 108 (90 BASE) MCG/ACT IN AERS
2.0000 | INHALATION_SPRAY | Freq: Once | RESPIRATORY_TRACT | Status: AC
Start: 2015-03-13 — End: 2015-03-13
  Administered 2015-03-13: 2 via RESPIRATORY_TRACT
  Filled 2015-03-13: qty 6.7

## 2015-03-13 MED ORDER — LEVOFLOXACIN 750 MG PO TABS: 750.0000 mg | ORAL_TABLET | Freq: Every day | ORAL | Status: DC

## 2015-03-13 MED ORDER — PREDNISONE 20 MG PO TABS
60.0000 mg | ORAL_TABLET | Freq: Once | ORAL | Status: AC
  Administered 2015-03-13: 60 mg via ORAL
  Filled 2015-03-13: qty 3

## 2015-03-13 MED ORDER — LEVOFLOXACIN 750 MG PO TABS
750.0000 mg | ORAL_TABLET | Freq: Once | ORAL | Status: AC
  Administered 2015-03-13: 750 mg via ORAL
  Filled 2015-03-13: qty 1

## 2015-03-13 NOTE — ED Notes (Signed)
Patient transported to X-ray 

## 2015-03-13 NOTE — ED Provider Notes (Signed)
CSN: [id number]     Arrival date & time 03/13/15  0310 History   First MD Initiated Contact with Patient 03/13/15 [phone]     Chief Complaint  Patient presents with  . Cough     (Consider location/radiation/quality/duration/timing/severity/associated sxs/prior Treatment) HPI Comments: Patient presents with cough that started around 6:00 pm last night. She feels chest tightness and SOB. No fever. No nausea and vomiting. No nasal congestion or sore throat. She has an inhaler at home given to her on previous ED evaluation for SOB/cough but denies history of asthma. She is a nonsmoker. She did not use the inhaler prior to arrival.   Patient is a 50 y.o. female presenting with cough. The history is provided by the patient. No language interpreter was used.  Cough Associated symptoms: no chest pain, no fever, no myalgias, no rash and no sore throat     Past Medical History  Diagnosis Date  . Diabetes mellitus, type 2 (HCC)     A1C 7.6 April '13  . Hypertension   . Nonischemic cardiomyopathy (HCC)     Echo 05/19/11 EF 35% w/ mild-mod MR; R/L Heart Cath 05/21/11 - mildly elevated right heart pressures, normal left heart pressures, preserved cardiac output, widely patent coronary arteries without significant CAD and moderate global systolic LV dysfunction, EF 35-40%.  . Systolic CHF (HCC)     Echo 05/19/11 EF 35% w/ mild-mod MR   Past Surgical History  Procedure Laterality Date  . Tubal ligation  1996  . Dilation and curettage of uterus    . Cervix lesion destruction    . Left heart catheterization with coronary angiogram N/A 05/21/2011    Procedure: LEFT HEART CATHETERIZATION WITH CORONARY ANGIOGRAM;  Surgeon: Tonny Bollman, MD;  Location: [facility] CATH LAB;  Service: Cardiovascular;  Laterality: N/A;   Family History  Problem Relation Age of Onset  . Diabetes      multiple  . Heart failure Paternal Grandmother   . Heart disease      multiple   Social History  Substance Use Topics  . Smoking  status: Never Smoker   . Smokeless tobacco: None  . Alcohol Use: No   OB History    No data available     Review of Systems  Constitutional: Negative for fever.  HENT: Negative for congestion and sore throat.   Respiratory: Positive for cough and chest tightness.   Cardiovascular: Negative for chest pain.  Gastrointestinal: Negative for nausea, vomiting and abdominal pain.  Musculoskeletal: Negative for myalgias.  Skin: Negative for rash.  Neurological: Negative for syncope and weakness.      Allergies  Review of patient's allergies indicates no known allergies.  Home Medications   Prior to Admission medications   Medication Sig Start Date End Date Taking? Authorizing Provider  albuterol (PROVENTIL HFA;VENTOLIN HFA) 108 (90 Base) MCG/ACT inhaler Inhale 1 puff into the lungs every 6 (six) hours as needed for wheezing or shortness of breath.   Yes Historical Provider, MD  aspirin 81 MG chewable tablet Chew 1 tablet (81 mg total) by mouth daily. 02/26/15  Yes Almon Hercules, MD  atorvastatin (LIPITOR) 80 MG tablet Take 1 tablet (80 mg total) by mouth daily at 6 PM. 02/26/15  Yes Almon Hercules, MD  carvedilol (COREG) 12.5 MG tablet Take 2 tablets (25 mg total) by mouth 2 (two) times daily with a meal. 02/26/15  Yes Almon Hercules, MD  clopidogrel (PLAVIX) 75 MG tablet Take 1 tablet (75 mg total) by  mouth daily. 02/26/15  Yes Almon Hercules, MD  furosemide (LASIX) 20 MG tablet Take 1 tablet (20 mg total) by mouth daily. If short of breath or leg swelling 02/26/15  Yes Almon Hercules, MD  ibuprofen (ADVIL,MOTRIN) 200 MG tablet Take 400 mg by mouth every 6 (six) hours as needed for headache.   Yes Historical Provider, MD  insulin NPH-regular Human (NOVOLIN 70/30) (70-30) 100 UNIT/ML injection Inject 15 units in the AM, inject 7 units in PM 12 hours later 10 minutes before meals 02/26/15  Yes Almon Hercules, MD  lisinopril (PRINIVIL,ZESTRIL) 40 MG tablet Take 1 tablet (40 mg total) by mouth daily. 02/26/15  09/07/18 Yes Almon Hercules, MD  metFORMIN (GLUCOPHAGE) 1000 MG tablet Take 0.5 tablets (500 mg total) by mouth 2 (two) times daily with a meal. Reported on 02/23/2015 02/26/15  Yes Almon Hercules, MD   BP 130/83 mmHg  Pulse 109  Temp(Src) 97.9 F (36.6 C) (Oral)  Resp 16  SpO2 93%  LMP 02/04/2015 Physical Exam  Constitutional: She is oriented to person, place, and time. She appears well-developed and well-nourished.  HENT:  Head: Normocephalic.  Neck: Normal range of motion. Neck supple.  Cardiovascular: Normal rate and regular rhythm.   Pulmonary/Chest: Effort normal. She has wheezes.  Coarse breath sounds bilaterally.  Abdominal: Soft. Bowel sounds are normal. There is no tenderness. There is no rebound and no guarding.  Musculoskeletal: Normal range of motion. She exhibits no edema.  Neurological: She is alert and oriented to person, place, and time.  Skin: Skin is warm and dry. No rash noted.  Psychiatric: She has a normal mood and affect.    ED Course  Procedures (including critical care time) Labs Review Labs Reviewed - No data to display  Imaging Review No results found. I have personally reviewed and evaluated these images and lab results as part of my medical decision-making.   EKG Interpretation None     Ct Angio Head W/cm &/or Wo Cm  02/25/2015  CLINICAL DATA:  Scattered left MCA territory infarcts. EXAM: CT ANGIOGRAPHY HEAD AND NECK TECHNIQUE: Multidetector CT imaging of the head and neck was performed using the standard protocol during bolus administration of intravenous contrast. Multiplanar CT image reconstructions and MIPs were obtained to evaluate the vascular anatomy. Carotid stenosis measurements (when applicable) are obtained utilizing NASCET criteria, using the distal internal carotid diameter as the denominator. CONTRAST:  50mL OMNIPAQUE IOHEXOL 350 MG/ML SOLN COMPARISON:  None. MRI brain 02/23/2014. FINDINGS: CT HEAD Brain: The scattered left MCA territory  infarcts are now evident on the noncontrast CT brain images. There is no associated hemorrhage or mass lesion. The ventricles are of normal size. No significant extra-axial fluid collection is present. Calvarium and skull base: Within normal limits Paranasal sinuses: Cleared Orbits: Negative. CTA NECK Aortic arch: The a 3 vessel arch configuration is present. There is no focal calcification or stenosis at the origins of the great vessels. The arch is intact. Right carotid system: There is mild irregularity of the distal right common carotid artery and an atherosclerotic change extending in the carotid bifurcation. A 1 mm posterior ulceration is present at the carotid bulb. The cervical right ICA is otherwise normal. Left carotid system: An intraluminal filling defect is present in the left common carotid artery beginning at the C7-T1 level extending superiorly to the C5-6 level. This is nonocclusive and focus on the medial aspect of the artery. The bifurcation is unremarkable. The cervical left ICA is otherwise  normal. Vertebral arteries:There is mild narrowing at the origin of the vertebral arteries bilaterally, less than 50%. Both vertebral arteries originate from the subclavian arteries. There is no significant stenosis within the cervical vertebral arteries. Skeleton: Endplate changes and uncovertebral spurring is present at C5-6 with bilateral osseous foraminal narrowing. There straightening of the normal cervical lordosis without other significant disease. Well-defined sclerotic lesions are present at T3. Other neck: No focal mucosal or submucosal lesions are present. The thyroid is normal. Reactive type level 2 lymph nodes are present bilaterally. The salivary glands are within normal limits. CTA HEAD Anterior circulation: Minimal irregularity is present within the cavernous internal carotid arteries bilaterally without a significant stenosis. The A1 and M1 segments are normal. There is asymmetric attenuation  of left MCA branch vessels with mild proximal narrowing just beyond the bifurcation. No focal occlusion is evident. The anterior communicating artery is patent. ACA branch vessels are within normal limits. Posterior circulation: The left vertebral artery is slightly dominant to the right. The PICA origins are visualized and normal. The basilar artery is within normal limits. Both posterior cerebral arteries originate from the basilar tip. The right posterior communicating artery is patent. The PCA branch vessels are within normal limits. Venous sinuses: The dural sinuses are patent. The right transverse sinus is dominant. Cortical veins are intact. The straight sinus deep cerebral veins are patent. Anatomic variants: Prominent right posterior communicating artery. Delayed phase: The postcontrast images demonstrate no pathologic enhancement. IMPRESSION: 1. 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 This is nonocclusive but likely limiting flow. 2. Asymmetric attenuation of left MCA branch vessels compared to the right. This likely reflects hypoperfusion. 3. Mild irregularity in the cavernous internal carotid arteries bilaterally and at the right carotid bifurcation without significant stenosis. 4. Focal endplate degenerative change and uncovertebral spurring at C5-6 with mild foraminal narrowing bilaterally. These results were called by telephone at the time of interpretation on 02/25/2015 at 12:41 pm to Dr. Ritta Slot , who verbally acknowledged these results. Electronically Signed   By: Marin Roberts M.D.   On: 02/25/2015 12:42   Dg Chest 2 View  03/13/2015  CLINICAL DATA:  Acute onset of cough and shortness of breath. Initial encounter. EXAM: CHEST  2 VIEW COMPARISON:  Chest radiograph from 02/24/2015 FINDINGS: The lungs are well-aerated. Patchy bilateral central airspace opacities, worse on the right, may reflect mild  pneumonia or interstitial edema. There is no evidence of pleural effusion or pneumothorax. The heart is normal in size; the mediastinal contour is within normal limits. No acute osseous abnormalities are seen. IMPRESSION: Patchy bilateral central airspace opacities, worse on the right, may reflect mild pneumonia or interstitial edema. Electronically Signed   By: Roanna Raider M.D.   On: 03/13/2015 06:31   Dg Chest 2 View  02/24/2015  CLINICAL DATA:  Acute left cerebral infarct. Weakness. Systolic congestive heart failure and nonischemic cardiomyopathy. EXAM: CHEST  2 VIEW COMPARISON:  04/07/2014 FINDINGS: The heart size and mediastinal contours are within normal limits. Both lungs are clear. The visualized skeletal structures are unremarkable. IMPRESSION: No active cardiopulmonary disease. Electronically Signed   By: Myles Rosenthal M.D.   On: 02/24/2015 10:23   Ct Head Wo Contrast  02/23/2015  CLINICAL DATA:  Altered mental status.  Initial encounter. EXAM: CT HEAD WITHOUT CONTRAST TECHNIQUE: Contiguous axial images were obtained from the base of the skull through the vertex without intravenous contrast. COMPARISON:  None. FINDINGS: There  is no evidence of acute intracranial abnormality including hemorrhage, infarct, mass lesion, mass effect, midline shift or abnormal extra-axial fluid collection. No hydrocephalus or pneumocephalus. The calvarium is intact. Imaged paranasal sinuses and mastoid air cells are clear. IMPRESSION: Negative head CT. Electronically Signed   By: Drusilla Kanner M.D.   On: 02/23/2015 16:08   Ct Angio Neck W/cm &/or Wo/cm  02/25/2015  CLINICAL DATA:  Scattered left MCA territory infarcts. EXAM: CT ANGIOGRAPHY HEAD AND NECK TECHNIQUE: Multidetector CT imaging of the head and neck was performed using the standard protocol during bolus administration of intravenous contrast. Multiplanar CT image reconstructions and MIPs were obtained to evaluate the vascular anatomy. Carotid stenosis  measurements (when applicable) are obtained utilizing NASCET criteria, using the distal internal carotid diameter as the denominator. CONTRAST:  50mL OMNIPAQUE IOHEXOL 350 MG/ML SOLN COMPARISON:  None. MRI brain 02/23/2014. FINDINGS: CT HEAD Brain: The scattered left MCA territory infarcts are now evident on the noncontrast CT brain images. There is no associated hemorrhage or mass lesion. The ventricles are of normal size. No significant extra-axial fluid collection is present. Calvarium and skull base: Within normal limits Paranasal sinuses: Cleared Orbits: Negative. CTA NECK Aortic arch: The a 3 vessel arch configuration is present. There is no focal calcification or stenosis at the origins of the great vessels. The arch is intact. Right carotid system: There is mild irregularity of the distal right common carotid artery and an atherosclerotic change extending in the carotid bifurcation. A 1 mm posterior ulceration is present at the carotid bulb. The cervical right ICA is otherwise normal. Left carotid system: An intraluminal filling defect is present in the left common carotid artery beginning at the C7-T1 level extending superiorly to the C5-6 level. This is nonocclusive and focus on the medial aspect of the artery. The bifurcation is unremarkable. The cervical left ICA is otherwise normal. Vertebral arteries:There is mild narrowing at the origin of the vertebral arteries bilaterally, less than 50%. Both vertebral arteries originate from the subclavian arteries. There is no significant stenosis within the cervical vertebral arteries. Skeleton: Endplate changes and uncovertebral spurring is present at C5-6 with bilateral osseous foraminal narrowing. There straightening of the normal cervical lordosis without other significant disease. Well-defined sclerotic lesions are present at T3. Other neck: No focal mucosal or submucosal lesions are present. The thyroid is normal. Reactive type level 2 lymph nodes are  present bilaterally. The salivary glands are within normal limits. CTA HEAD Anterior circulation: Minimal irregularity is present within the cavernous internal carotid arteries bilaterally without a significant stenosis. The A1 and M1 segments are normal. There is asymmetric attenuation of left MCA branch vessels with mild proximal narrowing just beyond the bifurcation. No focal occlusion is evident. The anterior communicating artery is patent. ACA branch vessels are within normal limits. Posterior circulation: The left vertebral artery is slightly dominant to the right. The PICA origins are visualized and normal. The basilar artery is within normal limits. Both posterior cerebral arteries originate from the basilar tip. The right posterior communicating artery is patent. The PCA branch vessels are within normal limits. Venous sinuses: The dural sinuses are patent. The right transverse sinus is dominant. Cortical veins are intact. The straight sinus deep cerebral veins are patent. Anatomic variants: Prominent right posterior communicating artery. Delayed phase: The postcontrast images demonstrate no pathologic enhancement. IMPRESSION: 1. 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 This is nonocclusive but likely limiting flow. 2. Asymmetric  attenuation of left MCA branch vessels compared to the right. This likely reflects hypoperfusion. 3. Mild irregularity in the cavernous internal carotid arteries bilaterally and at the right carotid bifurcation without significant stenosis. 4. Focal endplate degenerative change and uncovertebral spurring at C5-6 with mild foraminal narrowing bilaterally. These results were called by telephone at the time of interpretation on 02/25/2015 at 12:41 pm to Dr. Ritta Slot , who verbally acknowledged these results. Electronically Signed   By: Marin Roberts M.D.   On: 02/25/2015 12:42   Mr Laqueta Jean  IO Contrast  02/24/2015  CLINICAL DATA:  Initial evaluation for acute encephalopathy. EXAM: MRI HEAD WITHOUT AND WITH CONTRAST TECHNIQUE: Multiplanar, multiecho pulse sequences of the brain and surrounding structures were obtained without and with intravenous contrast. CONTRAST:  28mL MULTIHANCE GADOBENATE DIMEGLUMINE 529 MG/ML IV SOLN COMPARISON:  Prior head CT from 02/23/2015. FINDINGS: There are patchy multi focal ischemic infarcts involving the cortical gray matter and subcortical white matter of the left MCA distribution. These involve the left frontal lobe, left parietal lobe, and left temporal occipital region. These are positioned in a somewhat watershed distribution. Additional punctate infarct within the left caudate head (series 4, image 30). No associated hemorrhage or mass effect. No acute infarcts involving the right cerebral hemisphere or infratentorial brain. Major intracranial vascular flow voids are preserved. Cerebral volume within normal limits for patient age. No significant white matter disease. Probable tiny remote cerebellar infarcts noted. No mass lesion, midline shift, or mass effect. No hydrocephalus. No extra-axial fluid collection. Minimal patchy cortical/leptomeningeal enhancement about the areas of infarction. No other abnormal enhancement. Craniocervical junction normal. Pituitary gland within normal limits. No acute abnormality about the orbits. Paranasal sinuses are clear. Small right mastoid effusion noted. This is likely benign. Left mastoid air cells clear. Inner ear structures grossly normal. Bone marrow signal intensity within normal limits. No scalp soft tissue abnormality. IMPRESSION: 1. Patchy multi focal acute nonhemorrhagic left MCA territory ischemic infarcts as above. These are somewhat in a watershed distribution. Further imaging of the arterial vasculature of the head and neck recommended to evaluate for proximal stenosis. Additionally, query recent hypotensive  episode. 2. Probable tiny remote bilateral cerebellar infarcts. 3. Otherwise normal brain MRI. Electronically Signed   By: Rise Mu M.D.   On: 02/24/2015 06:33    MDM   Final diagnoses:  None    1. Cough 2. wheezing  5:20 - re-evaluation after breathing treatment. Patient is coughing much less, feels much better. Wheezing improved, moving air well. She is, however, 90% O2 saturation with good wave form on monitor. Will start 2nd treatment and plan on CXR.  6:45 - CXR showing PNA (which would be HCAP given recent hospitalization) vs edema (with h/o CHF). Patient feels much better after 2 breathing treatments, O2 saturation 95-99%, tachypnea improved.   Plan: labs to determine abx vs diuresis and admission vs. D/c home  Elpidio Anis, PA-C 03/13/15 [id number]  Linwood Dibbles, MD 03/13/15 [phone]

## 2015-03-13 NOTE — ED Notes (Signed)
She is awake, alert and happy to tell me she feels "much better".  Fine end exp. Wheezes present.  She is breathing normally.

## 2015-03-13 NOTE — Discharge Instructions (Signed)

## 2015-03-13 NOTE — ED Notes (Signed)
Pt was just released from [facility] on the 14th after having a stroke on a lot of new medications, she complains of a cough since yesterday and it scared her so she wanted to be checked

## 2015-03-14 ENCOUNTER — Ambulatory Visit: Payer: Self-pay

## 2015-03-14 NOTE — ED Provider Notes (Signed)
Pt here with cough.  CXR c/w pna.  Pt feels significantly improved after nebulizer x 2.  No hypoxia or respiratory distress.  Presentation is not c/w acute CHF/pulmonary edema, PE, ACS.  Treating with levaquin for pneumonia because of her recent hospitalization.  Discussed hyperglycemia - pt did not take her insulin today, will take when she gets home.  Discussed very close outpatient follow up as well as return precautions for any worsening of her symptoms.    Tilden Fossa, MD 03/14/15 [phone]

## 2015-03-18 ENCOUNTER — Encounter: Payer: Self-pay | Admitting: Student

## 2015-03-18 ENCOUNTER — Ambulatory Visit (INDEPENDENT_AMBULATORY_CARE_PROVIDER_SITE_OTHER): Payer: Self-pay | Admitting: Student

## 2015-03-18 VITALS — BP 122/86 | HR 110 | Temp 97.9°F | Wt 223.1 lb

## 2015-03-18 DIAGNOSIS — Z609 Problem related to social environment, unspecified: Secondary | ICD-10-CM

## 2015-03-18 DIAGNOSIS — E119 Type 2 diabetes mellitus without complications: Secondary | ICD-10-CM

## 2015-03-18 DIAGNOSIS — Z659 Problem related to unspecified psychosocial circumstances: Secondary | ICD-10-CM

## 2015-03-18 DIAGNOSIS — E118 Type 2 diabetes mellitus with unspecified complications: Secondary | ICD-10-CM

## 2015-03-18 DIAGNOSIS — E785 Hyperlipidemia, unspecified: Secondary | ICD-10-CM

## 2015-03-18 DIAGNOSIS — I10 Essential (primary) hypertension: Secondary | ICD-10-CM

## 2015-03-18 DIAGNOSIS — I5042 Chronic combined systolic (congestive) and diastolic (congestive) heart failure: Secondary | ICD-10-CM

## 2015-03-18 DIAGNOSIS — I63512 Cerebral infarction due to unspecified occlusion or stenosis of left middle cerebral artery: Secondary | ICD-10-CM

## 2015-03-18 LAB — GLUCOSE, CAPILLARY: Glucose-Capillary: 251 mg/dL — ABNORMAL HIGH (ref 65–99)

## 2015-03-18 MED ORDER — INSULIN NPH ISOPHANE & REGULAR (70-30) 100 UNIT/ML ~~LOC~~ SUSP: SUBCUTANEOUS | Status: DC

## 2015-03-18 NOTE — Progress Notes (Signed)
Subjective:    Patient ID: [name], female    DOB: Apr 28, 1965, 50 y.o.   MRN: [id number]  HPI Work: patient out of work since she was admitted for stroke. She was tearful at the beginning of the encounter. She was to return to work but ended up in ED on 01/29 for shortness of breath. There was concern for pneumonia although her symptoms improved with breathing treatment. She was prescribed antibiotics (Levaquin) but couldn't afford it. She didn't have shortness of breath, chest pain or fever since then. She didn't need her breathing treatment. She wasn't sure if she could go back to work until she follow up here. She missed her first follow up on 01/27. She says she would like to go back to work. She is planning to call her office on Monday. She says they can find her a new assignment as her previous assignment has ended.  Patient lived with her daughter, who moved out with her fiance. She is now looking for new apartment. However, she headache no means of income now. She is interested in list of available shelters in the area.  Medical insurance: she has no insurance right now. She was able to get her medication cheaper with the voucher she was given on discharge.   Stroke: no further neurologic symptoms. Denies numbness, tingling, speech changes, and weakness in her arms & legs. Taking her Plavix but not Aspirin. She says the pharmacy didn't give her Aspirin.   Diabetes: taking her insulin and metformin as prescribed. Says the metformin is making her go to the bathroom frequently.  Hypertension/HFrEF: no shortness of breath or swelling in her leg. Denies chest pain as well.  Review of Systems Per HPI    Objective:   Physical Exam Filed Vitals:   03/18/15 1541 03/18/15 1610  BP: 158/64 122/86  Pulse: 110   Temp: 97.9 F (36.6 C)   TempSrc: Oral   Weight: 223 lb 1.6 oz (101.197 kg)   SpO2: 99%    Gen: appears well, tearful at the beginning of the encounter, pleasant CV:  regular rate and rythm. S1 & S2 audible, 1/6 SEM over RUSB Resp: no apparent work of breathing, good aeration bilaterally, clear to auscultation bilaterally. Neuro: grossly intact, speech normal MSK: no ext edema.    Assessment & Plan:  Stroke (cerebrum) (HCC) Stable. No further neurologic symptoms. Neuro exam grossly intact today. No slurred speech or aphasia. She hasn't been taking her ASA. She didn't know she could buy this over the counter. Patient didn't here from cardiology for Holter monitor. 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  -Advised her to get baby ASA over the counter and take it once daily -Will continue Plavix and ASA until her repeat CTA -Continue BP, Cholestrol and Diabetes meds -Repeat CTA neck and head in July 2017.  Diabetes mellitus, type 2 (HCC) Not well-controlled. Bmet glucose in 300 4 days ago. POCT glucose 251 today. No signs of hypo or hyperglycemia at the moment. -Increased her 70/30 insulin to 17 units in the morning and 8 units in the evening -Will keep metformin at 500 mg twice a day given her GI symptoms. Anticipate this to improve. We will consider increasing this when GI symptoms improve. -follow up in one month. Will check A1c and POCT glucose -will discuss about opthalmology referral for routine eye exam when she has insurance -will hold of PPSV-23 and Influenza until  she gets her orange card.  Hypertension Stable. Started on lisinopril 40 mg once daily. Also on Coreg 12.5 mg twice a day. Reports good compliance. BP 158/64 on arrival. Repeat BP 122/86. Her Bmet at ED visit 4 days ago within normal limit except for glucose to 340's. Cr and K normal -No need to repeat Bmet today. -continue BP meds.  Chronic combined systolic and diastolic congestive heart failure (HCC) HFrEF. Echo with EF 35-40% (stable). No dyspnea or edema. On lisinopril 40 mg and Coreg 12.5 mg twice a day. Hasn't  taken her lasix as she didn't have edema or shortness of breath.  -Continue meds  -Lasix as needed edema and dyspnea  Hyperlipidemia On high intensity statin. Tolerating well.  Social problem Patient without insurance. Person helping with orange card helping other patient to see patient today. Advised to call on Monday for this.  Patient without income now. She used to live with daughter and daughter's fiance. They have moved out now. She is still at this place but looking for apartment for rent at affordable price. She is also interested in available shelters until she finds one and get back to her work. She is planning to call her employer on Monday for new assignment as her previous assignment has ended. -Gave list of available shelters and resources in the area

## 2015-03-18 NOTE — Patient Instructions (Addendum)
It was great seeing you today! We have addressed the following issues today  1. Stroke: we will arrange for head and neck CT (imaging in about 5 months). I would like to get ASA 81 mg over the counter and take one every day. 2. Diabetes: your blood glucose is 251. I have increased your insulin to 17 units in the morning and 8 units in the evening.  3. Hypertension/heart failure: your blood pressure is 122/66mmHg. Continue your medications as they are. Use lasix if you have swelling in your legs. 4. Orange card: I would like to call the office on Monday and schedule an appointment for this. You can find the number below 5. Work: you can return to work any time at [employer].    If we did any lab work today, and the results require attention, either me or my nurse will get in touch with you. If everything is normal, you will get a letter in mail. If you don't hear from [place] in two weeks, please give [place] a call. Otherwise, I look forward to talking with you again at our next visit. If you have any questions or concerns before then, please call the clinic at [phone].  Please bring all your medications to every doctors visit   Sign up for My Chart to have easy access to your labs results, and communication with your Primary care physician.    Please check-out at the front desk before leaving the clinic.   Take Care,

## 2015-03-19 ENCOUNTER — Encounter: Payer: Self-pay | Admitting: Student

## 2015-03-19 NOTE — Assessment & Plan Note (Signed)
HFrEF. Echo with EF 35-40% (stable). No dyspnea or edema. On lisinopril 40 mg and Coreg 12.5 mg twice a day. Hasn't taken her lasix as she didn't have edema or shortness of breath.  -Continue meds  -Lasix as needed edema and dyspnea

## 2015-03-19 NOTE — Assessment & Plan Note (Addendum)
Stable. No further neurologic symptoms. Neuro exam grossly intact today. No slurred speech or aphasia. She hasn't been taking her ASA. She didn't know she could buy this over the counter. Patient didn't here from cardiology for Holter monitor. 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  -Advised her to get baby ASA over the counter and take it once daily -Will continue Plavix and ASA until her repeat CTA -Continue BP, Cholestrol and Diabetes meds -Repeat CTA neck and head in July 2017.

## 2015-03-19 NOTE — Assessment & Plan Note (Signed)
Stable. Started on lisinopril 40 mg once daily. Also on Coreg 12.5 mg twice a day. Reports good compliance. BP 158/64 on arrival. Repeat BP 122/86. Her Bmet at ED visit 4 days ago within normal limit except for glucose to 340's. Cr and K normal -No need to repeat Bmet today. -continue BP meds.

## 2015-03-19 NOTE — Assessment & Plan Note (Signed)
On high intensity statin. Tolerating well.

## 2015-03-19 NOTE — Assessment & Plan Note (Addendum)
Not well-controlled. Bmet glucose in 300 4 days ago. POCT glucose 251 today. No signs of hypo or hyperglycemia at the moment. -Increased her 70/30 insulin to 17 units in the morning and 8 units in the evening -Will keep metformin at 500 mg twice a day given her GI symptoms. Anticipate this to improve. We will consider increasing this when GI symptoms improve. -follow up in one month. Will check A1c and POCT glucose -will discuss about opthalmology referral for routine eye exam when she has insurance -will hold of PPSV-23 and Influenza until she gets her orange card.

## 2015-03-19 NOTE — Assessment & Plan Note (Signed)
Patient without insurance. Person helping with orange card helping other patient to see patient today. Advised to call on Monday for this.  Patient without income now. She used to live with daughter and daughter's fiance. They have moved out now. She is still at this place but looking for apartment for rent at affordable price. She is also interested in available shelters until she finds one and get back to her work. She is planning to call her employer on Monday for new assignment as her previous assignment has ended. -Gave list of available shelters and resources in the area

## 2015-03-28 ENCOUNTER — Ambulatory Visit: Payer: Self-pay | Admitting: Family Medicine

## 2015-04-11 ENCOUNTER — Ambulatory Visit: Payer: Self-pay

## 2015-05-05 ENCOUNTER — Ambulatory Visit (INDEPENDENT_AMBULATORY_CARE_PROVIDER_SITE_OTHER): Payer: Self-pay | Admitting: Student

## 2015-05-05 ENCOUNTER — Ambulatory Visit: Payer: Self-pay

## 2015-05-05 ENCOUNTER — Encounter: Payer: Self-pay | Admitting: Student

## 2015-05-05 VITALS — BP 151/104 | HR 99 | Temp 97.6°F | Wt 229.3 lb

## 2015-05-05 DIAGNOSIS — E785 Hyperlipidemia, unspecified: Secondary | ICD-10-CM

## 2015-05-05 DIAGNOSIS — I6320 Cerebral infarction due to unspecified occlusion or stenosis of unspecified precerebral arteries: Secondary | ICD-10-CM

## 2015-05-05 DIAGNOSIS — I5021 Acute systolic (congestive) heart failure: Secondary | ICD-10-CM

## 2015-05-05 DIAGNOSIS — E119 Type 2 diabetes mellitus without complications: Secondary | ICD-10-CM

## 2015-05-05 DIAGNOSIS — Z659 Problem related to unspecified psychosocial circumstances: Secondary | ICD-10-CM

## 2015-05-05 DIAGNOSIS — I5022 Chronic systolic (congestive) heart failure: Secondary | ICD-10-CM

## 2015-05-05 DIAGNOSIS — I429 Cardiomyopathy, unspecified: Secondary | ICD-10-CM

## 2015-05-05 DIAGNOSIS — Z609 Problem related to social environment, unspecified: Secondary | ICD-10-CM

## 2015-05-05 DIAGNOSIS — I5042 Chronic combined systolic (congestive) and diastolic (congestive) heart failure: Secondary | ICD-10-CM

## 2015-05-05 DIAGNOSIS — I428 Other cardiomyopathies: Secondary | ICD-10-CM

## 2015-05-05 DIAGNOSIS — I10 Essential (primary) hypertension: Secondary | ICD-10-CM

## 2015-05-05 DIAGNOSIS — R079 Chest pain, unspecified: Secondary | ICD-10-CM

## 2015-05-05 LAB — BASIC METABOLIC PANEL WITH GFR
BUN: 14 mg/dL (ref 7–25)
CHLORIDE: 100 mmol/L (ref 98–110)
CO2: 24 mmol/L (ref 20–31)
CREATININE: 0.97 mg/dL (ref 0.50–1.10)
Calcium: 9 mg/dL (ref 8.6–10.2)
GFR, Est African American: 80 mL/min (ref >=60)
GFR, Est Non African American: 70 mL/min (ref >=60)
Glucose, Bld: 377 mg/dL — ABNORMAL HIGH (ref 65–99)
POTASSIUM: 4.3 mmol/L (ref 3.5–5.3)
Sodium: 133 mmol/L — ABNORMAL LOW (ref 135–146)

## 2015-05-05 LAB — LIPID PANEL
CHOL/HDL RATIO: 6.4 ratio — AB (ref <=5.0)
Cholesterol: 187 mg/dL (ref 125–200)
HDL: 29 mg/dL — ABNORMAL LOW (ref >=46)
LDL CALC: 121 mg/dL (ref <130)
TRIGLYCERIDES: 184 mg/dL — AB (ref <150)
VLDL: 37 mg/dL — AB (ref <30)

## 2015-05-05 LAB — POCT GLYCOSYLATED HEMOGLOBIN (HGB A1C): Hemoglobin A1C: 11.4

## 2015-05-05 LAB — GLUCOSE, CAPILLARY: GLUCOSE-CAPILLARY: 375 mg/dL — AB (ref 65–99)

## 2015-05-05 MED ORDER — INSULIN NPH ISOPHANE & REGULAR (70-30) 100 UNIT/ML ~~LOC~~ SUSP: SUBCUTANEOUS | Status: DC

## 2015-05-05 MED ORDER — CARVEDILOL 12.5 MG PO TABS: 25.0000 mg | ORAL_TABLET | Freq: Two times a day (BID) | ORAL | Status: DC

## 2015-05-05 MED ORDER — METFORMIN HCL 1000 MG PO TABS: 500.0000 mg | ORAL_TABLET | Freq: Two times a day (BID) | ORAL | Status: DC

## 2015-05-05 MED ORDER — ATORVASTATIN CALCIUM 80 MG PO TABS: 80.0000 mg | ORAL_TABLET | Freq: Every day | ORAL | Status: DC

## 2015-05-05 MED ORDER — LISINOPRIL 40 MG PO TABS: 40.0000 mg | ORAL_TABLET | Freq: Every day | ORAL | Status: DC

## 2015-05-05 MED ORDER — FUROSEMIDE 40 MG PO TABS: 40.0000 mg | ORAL_TABLET | Freq: Two times a day (BID) | ORAL | Status: DC

## 2015-05-05 MED ORDER — CLOPIDOGREL BISULFATE 75 MG PO TABS: 75.0000 mg | ORAL_TABLET | Freq: Every day | ORAL | Status: DC

## 2015-05-05 MED FILL — ATORVASTATIN 80 MG TABLET: 80 | 30 days supply | Qty: 30 | Fill #0

## 2015-05-05 MED FILL — FUROSEMIDE 40 MG TABLET: 40 | 30 days supply | Qty: 60 | Fill #0

## 2015-05-05 MED FILL — metFORMIN HCL 500 MG TABS: 500 | 30 days supply | Qty: 60 | Fill #0

## 2015-05-05 MED FILL — CLOPIDOGREL 75 MG TABLET: 75 | 30 days supply | Qty: 30 | Fill #0

## 2015-05-05 MED FILL — *NOVOLIN 70/30 VIAL 10 ML: (70-30) 100 | 30 days supply | Qty: 10 | Fill #0

## 2015-05-05 MED FILL — LISINOPRIL 40 MG TABLET: 40 | 30 days supply | Qty: 30 | Fill #0 | Status: TO

## 2015-05-05 MED FILL — CARVEDILOL 25 MG TABLET: 25 | 30 days supply | Qty: 60 | Fill #0 | Status: TO

## 2015-05-05 NOTE — Progress Notes (Unsigned)
CSW received consult to assist patient with current medications.  Patient has limited income and no insurance.  Call to [facility] to obtain cost of medications for patient $99.25.   AD of clinic assisted CSW with obtaining medication via [facility] outpatient pharmacy.   Patient will pick up meds today.  Patient will also meet with financial counselor prior to leaving the clinic to start application for an [place] card so that she can obtain her medications next month.    CSW spoke with patient about utilizing her child support check so that she is able to purchase her medications next month.  Patient was appreciative of talking with CSW and is in agreement to try to save the money as she understands her health is important.    Sammuel Hines. LCSWA Clinical Social Work,  [phone] 4:14 PM

## 2015-05-05 NOTE — Assessment & Plan Note (Signed)
BP elevated in the setting of not taking he medication. -Refilled her prescription today -Will assess next week

## 2015-05-05 NOTE — Patient Instructions (Signed)
It was great seeing you today! We have addressed the following issues today   Shortness of breath: I have sent prescriptions to your pharmacy. I have increased your fluid pill to 40 mg twice daily. Take one in the morning and at lunch time for swelling of legs and shortness of breath.  Diabetes: continue using your insulin.    If we did any lab work today, and the results require attention, either me or my nurse will get in touch with you. If everything is normal, you will get a letter in mail. If you don't hear from [place] in two weeks, please give [place] a call. Otherwise, I look forward to talking with you again at our next visit. If you have any questions or concerns before then, please call the clinic at [phone].  Please bring all your medications to every doctors visit   Sign up for My Chart to have easy access to your labs results, and communication with your Primary care physician.    Please check-out at the front desk before leaving the clinic.   Take Care,   Heart Failure Heart failure means your heart has trouble pumping blood. This makes it hard for your body to work well. Heart failure is usually a long-term (chronic) condition. You must take good care of yourself and follow your doctor's treatment plan. HOME CARE  Take your heart medicine as told by your doctor.  Do not stop taking medicine unless your doctor tells you to.  Do not skip any dose of medicine.  Refill your medicines before they run out.  Take other medicines only as told by your doctor or pharmacist.  Stay active if told by your doctor. The elderly and people with severe heart failure should talk with a doctor about physical activity.  Eat heart-healthy foods. Choose foods that are without trans fat and are low in saturated fat, cholesterol, and salt (sodium). This includes fresh or frozen fruits and vegetables, fish, lean meats, fat-free or low-fat dairy foods, whole grains, and high-fiber foods.  Lentils and dried peas and beans (legumes) are also good choices.  Limit salt if told by your doctor.  Cook in a healthy way. Roast, grill, broil, bake, poach, steam, or stir-fry foods.  Limit fluids as told by your doctor.  Weigh yourself every morning. Do this after you pee (urinate) and before you eat breakfast. Write down your weight to give to your doctor.  Take your blood pressure and write it down if your doctor tells you to.  Ask your doctor how to check your pulse. Check your pulse as told.  Lose weight if told by your doctor.  Stop smoking or chewing tobacco. Do not use gum or patches that help you quit without your doctor's approval.  Schedule and go to doctor visits as told.  Nonpregnant women should have no more than 1 drink a day. Men should have no more than 2 drinks a day. Talk to your doctor about drinking alcohol.  Stop illegal drug use.  Stay current with shots (immunizations).  Manage your health conditions as told by your doctor.  Learn to manage your stress.  Rest when you are tired.  If it is really hot outside:  Avoid intense activities.  Use air conditioning or fans, or get in a cooler place.  Avoid caffeine and alcohol.  Wear loose-fitting, lightweight, and light-colored clothing.  If it is really cold outside:  Avoid intense activities.  Layer your clothing.  Wear mittens or  gloves, a hat, and a scarf when going outside.  Avoid alcohol.  Learn about heart failure and get support as needed.  Get help to maintain or improve your quality of life and your ability to care for yourself as needed. GET HELP IF:   You gain weight quickly.  You are more short of breath than usual.  You cannot do your normal activities.  You tire easily.  You cough more than normal, especially with activity.  You have any or more puffiness (swelling) in areas such as your hands, feet, ankles, or belly (abdomen).  You cannot sleep because it is hard  to breathe.  You feel like your heart is beating fast (palpitations).  You get dizzy or light-headed when you stand up. GET HELP RIGHT AWAY IF:   You have trouble breathing.  There is a change in mental status, such as becoming less alert or not being able to focus.  You have chest pain or discomfort.  You faint. MAKE SURE YOU:   Understand these instructions.  Will watch your condition.  Will get help right away if you are not doing well or get worse.   This information is not intended to replace advice given to you by your health care provider. Make sure you discuss any questions you have with your health care provider.   Document Released: 11/08/2007 Document Revised: 02/19/2014 Document Reviewed: 03/17/2012 Elsevier Interactive Patient Education [employer]

## 2015-05-05 NOTE — Assessment & Plan Note (Signed)
Improved. A1c 11.4 today (13.2 in Jan 2017) -Refilled her insulin and metformin today -Bmet today

## 2015-05-05 NOTE — Progress Notes (Signed)
   Subjective:    Patient ID: [name], female    DOB: [birth year], 50 y.o.   MRN: [id number]  HPI  Shortness of breath: for 6 weeks. Worse with walking. Shortness of breath with walking from parking lot to office. Not able to sleep at night due to pain in her legs. She says she is worried about her breathing. Never felt this before. Sleeps with 4 pillows for one month. Coughing since her last visit. Productive with thick yellow phlegm. No fever. No night sweat. Swelling in her legs for a week. Started taking lasix 20 mg daily for the last two days but didn't help.  She didn't get her yellow card last month. She was told to bring income verification documents but didn't get it until a week ago. So she has been out of all her medications for two weeks.   Denies fever, hemoptysis, chest pain with exertion, focal weakness, numbness, tingling or speech changes. ROS Objective:   Physical Exam Filed Vitals:   05/05/15 1415  BP: 151/104  Pulse: 99  Temp: 97.6 F (36.4 C)  TempSrc: Oral  Weight: 229 lb 4.8 oz (104.01 kg)   Gen: appears well, no acute ditress Neck: no JVD or HJR CV: regular rate and rythm. S1 & S2 audible, no murmurs, 1+ pitting edema to mid-shin bilaterally Resp: no apparent work of breathing, clear to auscultation bilaterally. GI: bowel sounds normal, no tenderness to palpation, no rebound or guarding, no mass.  Skin: no lesion Neuro: alert and oriented, no gross deficit. Speech fluent    Assessment & Plan:  Chronic combined systolic and diastolic congestive heart failure (HCC) Dyspnea with exertion, orthopnea, cough and bilateral extremity edema suggestive for CHF exacerbation. Exam without wheeze, crackles, JVD or HJR but 1+ bilateral ext edema. Weight up by 6 lbs since her last visit about 6 weeks ago. BP elevated. Out of all her medications for the last two weeks due to lack of insurance. Appears well to admit for inpatient management.  -Patient saw SW and Freight forwarder. Orange card application started today. -[facility]. Patient will get the medications for free after discussion among social work, Programme researcher, broadcasting/film/video. -Increased lasix from 20 mg as needed to 40 mg twice a day -BMP and BNP today -Patient to follow up in clinic next week -Will assess fluid status and get Bmet next week  Hypertension BP elevated in the setting of not taking he medication. -Refilled her prescription today -Will assess next week   Diabetes mellitus, type 2 (HCC) Improved. A1c 11.4 today (13.2 in Jan 2017) -Refilled her insulin and metformin today -Bmet today  Social problem Patient with complex social situation. Now living with her sister. Out of work since admission to [place] for stroke three months ago. Still searching for job and place to live. Out of her medications for two weeks as she couldn't afford them. -Seen by SW and Artist today. Started orange card application -Refills from [facility]patient pharmacy without charge today thanks to [place] (SW)

## 2015-05-05 NOTE — Assessment & Plan Note (Signed)
Patient with complex social situation. Now living with her sister. Out of work since admission to [place] for stroke three months ago. Still searching for job and place to live. Out of her medications for two weeks as she couldn't afford them. -Seen by SW and Artist today. Started orange card application -Refills from [facility] pharmacy without charge today thanks to [place] (SW)

## 2015-05-05 NOTE — Assessment & Plan Note (Addendum)
Dyspnea with exertion, orthopnea, cough and bilateral extremity edema suggestive for CHF exacerbation. Exam without wheeze, crackles, JVD or HJR but 1+ bilateral ext edema. Weight up by 6 lbs since her last visit about 6 weeks ago. BP elevated. Out of all her medications for the last two weeks due to lack of insurance. Appears well to admit for inpatient management.  -Patient saw SW and Artist. Orange card application started today. -[facility]. Patient will get the medications for free after discussion among social work, Programme researcher, broadcasting/film/video. -Increased lasix from 20 mg as needed to 40 mg twice a day -BMP and BNP today -Patient to follow up in clinic next week -Will assess fluid status and get Bmet next week

## 2015-05-06 LAB — BRAIN NATRIURETIC PEPTIDE: Brain Natriuretic Peptide: 470.7 pg/mL — ABNORMAL HIGH (ref <100)

## 2015-05-09 ENCOUNTER — Telehealth: Payer: Self-pay | Admitting: Student

## 2015-05-09 DIAGNOSIS — I5043 Acute on chronic combined systolic (congestive) and diastolic (congestive) heart failure: Secondary | ICD-10-CM

## 2015-05-09 NOTE — Telephone Encounter (Signed)
Called to follow up on her dyspnea (CHF). She says her breathing is better but the swelling has not gone. Patient was to return in a week when I saw her last in clinic. However, the follow up appointment was not made. I advised her to call front desk to make this schedule, and she agreed. She was started on Lasix 40 mg twice a day. This need adjustment based on her fluid status. She also need BMP which I have placed as future order.

## 2015-05-10 ENCOUNTER — Telehealth: Payer: Self-pay | Admitting: Licensed Clinical Social Worker

## 2015-05-10 NOTE — Telephone Encounter (Signed)
Follow up call to patient from meeting with CSW last week.  Patient confirmed that she obtained her medication from the [facility] outpatient clinic last week.  Patient states she has a follow up appointment tomorrow and will bring in the needed information to complete the application for [facility] so that she will be able to obtain her medication next month.  Patient was appreciative of follow up by CSW.  Sammuel Hines. LCSWA Clinical Social Work,  [phone] 12:34 PM

## 2015-05-11 ENCOUNTER — Encounter: Payer: Self-pay | Admitting: Family Medicine

## 2015-05-11 ENCOUNTER — Ambulatory Visit (INDEPENDENT_AMBULATORY_CARE_PROVIDER_SITE_OTHER): Payer: Self-pay | Admitting: Family Medicine

## 2015-05-11 VITALS — BP 126/91 | HR 103 | Temp 98.3°F | Wt 218.3 lb

## 2015-05-11 DIAGNOSIS — Z659 Problem related to unspecified psychosocial circumstances: Secondary | ICD-10-CM

## 2015-05-11 DIAGNOSIS — Z609 Problem related to social environment, unspecified: Secondary | ICD-10-CM

## 2015-05-11 DIAGNOSIS — E1100 Type 2 diabetes mellitus with hyperosmolarity without nonketotic hyperglycemic-hyperosmolar coma (NKHHC): Secondary | ICD-10-CM

## 2015-05-11 DIAGNOSIS — I5042 Chronic combined systolic (congestive) and diastolic (congestive) heart failure: Secondary | ICD-10-CM

## 2015-05-11 DIAGNOSIS — I10 Essential (primary) hypertension: Secondary | ICD-10-CM

## 2015-05-11 LAB — BASIC METABOLIC PANEL
BUN: 17 mg/dL (ref 7–25)
CALCIUM: 9.1 mg/dL (ref 8.6–10.2)
CHLORIDE: 99 mmol/L (ref 98–110)
CO2: 27 mmol/L (ref 20–31)
CREATININE: 0.92 mg/dL (ref 0.50–1.10)
Glucose, Bld: 347 mg/dL — ABNORMAL HIGH (ref 65–99)
Potassium: 4.2 mmol/L (ref 3.5–5.3)
Sodium: 137 mmol/L (ref 135–146)

## 2015-05-11 NOTE — Assessment & Plan Note (Signed)
Needs to follow through on eligibility.  Stressed the importance of ongoing care of her chronic medical problems.

## 2015-05-11 NOTE — Assessment & Plan Note (Addendum)
Uncertain control.  Will look at [facility] on BMP.  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  Called with labs and BS still high.  She had only been taking metformin and insulin 70/30 daily, not twice a day.  Now knows that she needs to take twice a day.

## 2015-05-11 NOTE — Patient Instructions (Signed)
I will call with the blood test results.   You seem to be moving in the right direction.   See Dr. Alanda Slim in 1-2 weeks to make sure we are still moving correctly. We are looking for your dry weight.  Help [place] by weighing yourself every morning and keeping a record. Follow through on the financial pieces - it is vital that you have a reliable source of medications and follow up care. I would like to get you to the cardiologist soon.  I would also like to set you up with diabetes classes.  I would be best if you monitored your blood sugar at home.

## 2015-05-11 NOTE — Assessment & Plan Note (Signed)
Improved with 11 lb weight loss.  Not yet at dry weight.  Needs cardiology follow up, which I would arrange after financial eligibility.  Needs BMP since restarted ACE.

## 2015-05-11 NOTE — Progress Notes (Signed)
   Subjective:    Patient ID: [name], female    DOB: 15-Nov-1965, 50 y.o.   MRN: [id number]  HPI  FU decompensated systolic heart failure.  See Dr. Alanda Slim note of 3/23. Issues: 1. CHF.  Much less dyspnea.  Still some DOE.  Concerned because her mother died of heart failure.  States on meds and using low sodium diet.  Still with some ankle swelling.  It has been years since she had a cardiology FU. 2. HBP - asymptomatic.  She is now back on meds. 3. DM - back on meds.  She has no clue what her BS is.  Does not check home BS. 4. Social.  Has scripts.  She applied for disability after her Jan 2017 stroke and was turned down.  Has not yet done the financial eligibility with [facility].     Review of Systems     Objective:   Physical ExamVS noted including good BP and 11 lb wt loss Lungs clear Cardiac RRR without m or g Ext still 2+ bilateral edema.       Assessment & Plan:

## 2015-05-11 NOTE — Assessment & Plan Note (Signed)
Much better control.

## 2015-06-06 MED FILL — *NOVOLIN 70/30 VIAL 10 ML: (70-30) 100 | 30 days supply | Qty: 10 | Fill #1

## 2015-07-20 ENCOUNTER — Encounter: Payer: Self-pay | Admitting: Student

## 2015-07-20 ENCOUNTER — Ambulatory Visit (INDEPENDENT_AMBULATORY_CARE_PROVIDER_SITE_OTHER): Payer: Self-pay | Admitting: Student

## 2015-07-20 VITALS — BP 139/72 | HR 93 | Temp 97.9°F | Wt 217.0 lb

## 2015-07-20 DIAGNOSIS — F329 Major depressive disorder, single episode, unspecified: Secondary | ICD-10-CM

## 2015-07-20 DIAGNOSIS — E1100 Type 2 diabetes mellitus with hyperosmolarity without nonketotic hyperglycemic-hyperosmolar coma (NKHHC): Secondary | ICD-10-CM

## 2015-07-20 DIAGNOSIS — M25562 Pain in left knee: Secondary | ICD-10-CM

## 2015-07-20 DIAGNOSIS — I5042 Chronic combined systolic (congestive) and diastolic (congestive) heart failure: Secondary | ICD-10-CM

## 2015-07-20 DIAGNOSIS — M79602 Pain in left arm: Secondary | ICD-10-CM

## 2015-07-20 DIAGNOSIS — G8929 Other chronic pain: Secondary | ICD-10-CM

## 2015-07-20 DIAGNOSIS — F32A Depression, unspecified: Secondary | ICD-10-CM

## 2015-07-20 DIAGNOSIS — M79605 Pain in left leg: Secondary | ICD-10-CM

## 2015-07-20 DIAGNOSIS — F411 Generalized anxiety disorder: Secondary | ICD-10-CM

## 2015-07-20 DIAGNOSIS — I5022 Chronic systolic (congestive) heart failure: Secondary | ICD-10-CM

## 2015-07-20 LAB — POCT GLYCOSYLATED HEMOGLOBIN (HGB A1C): HEMOGLOBIN A1C: 9.9

## 2015-07-20 LAB — GLUCOSE, CAPILLARY: GLUCOSE-CAPILLARY: 251 mg/dL — AB (ref 65–99)

## 2015-07-20 MED ORDER — TRAMADOL HCL 50 MG PO TABS: 50.0000 mg | ORAL_TABLET | Freq: Three times a day (TID) | ORAL | Status: DC | PRN

## 2015-07-20 MED ORDER — SERTRALINE HCL 50 MG PO TABS: 50.0000 mg | ORAL_TABLET | Freq: Every day | ORAL | Status: DC

## 2015-07-20 NOTE — Patient Instructions (Addendum)
It was great seeing you today! We have addressed the following issues today  1. Leg pain: gave you prescription for Tramadol. Sent referral to physical therapy. You will hear from them soon 2. Depression and anxiety: i have started you on Zoloft (medicine). This may take up to 6 weeks to work. I would like to see you back in two weeks 3. Heart failure: I have ordered a referral to heart doctors. They will call you for an appointment.  4. Diabetes: your A1c is 9.6 (down from 11). Continue taking your medications. You are doing great!!    If we did any lab work today, and the results require attention, either me or my nurse will get in touch with you. If everything is normal, you will get a letter in mail. If you don't hear from [place] in two weeks, please give [place] a call. Otherwise, I look forward to talking with you again at our next visit. If you have any questions or concerns before then, please call the clinic at [phone].  Please bring all your medications to every doctors visit   Sign up for My Chart to have easy access to your labs results, and communication with your Primary care physician.    Please check-out at the front desk before leaving the clinic.   Happy Birth Day!!!

## 2015-07-20 NOTE — Progress Notes (Signed)
   Subjective:    Patient ID: [name], female    DOB: [birth year], 50 y.o.   MRN: [id number]  CC: leg pain  HPI # left ankle pain: She says she broke her foot in 2010 while walking. She went to therapy and was told it was a muscle sprain not broken. She feels pain from her hip down to her foot anteriorly. The pain never goes away. Pain is "achy". Pain is  6/10 now. Tried ibuprofen 200 mg three times a day which helps a little bit. Pain is worse with lying down. She could walk. She says she went to a disability doctor about three weeks ago. She says they just talked to her and didn't give her any medication.  Denies fever, chills, night sweating, bowel or urinary incontinence. Reports weight loss, but intentional. She is also on diuretics.   #Depression and Anxiety: PHQ-9 15. She started working as a Conservation officer, nature but couldn't continue. She says the work was too much as they make her do "all kind of work including housekeeping". She reports worsening leg swelling and pain and had to stop working. She reports feeling bad because she wasn't able to find another job.   #Heart failure: denies chest pain, shortness of breath or leg swelling. Reports good compliance with her medications.  #Diabetes: A1c  Denies symptoms of hypo or hyperglycemia. Taking her insulin and other meds as prescribed.   Review of Systems  Per HPI Objective:   Physical Exam Filed Vitals:   07/20/15 1500  BP: 139/72  Pulse: 93  Temp: 97.9 F (36.6 C)  TempSrc: Oral  Weight: 217 lb (98.431 kg)   GEN: pleasant, appears well, NAD Oropharynx: clear, moist Neck: supple, no LAD CVS: RRR, normal s1 and s2, no murmurs, no edema RESP: no increased work of breathing, good air movement bilaterally, no crackles or wheeze GI: soft, non-tender,non-distended, +BS MSK: symmetric bulk of muscles in both legs,  NEURO: alert, oriented, straight leg raise negative, intact sensation in all dermatomes bilaterally, motor 5/5 in all  muscle groups, not able elicit DTR bilaterally even with distraction, proprioception intact. PSYCH: she reports feeling sad occasionally but has good affect.     Assessment & Plan:  Left leg pain Unclear etiology. Unilateral nature and distribution make neuropathy and radiculopathy unlikely. No red flags. No significant exam finding except for diminished patellar reflexes bilaterally. -Gave prescription for tramadol -Advised to stop taking NSAID given her cardiac history. This could also affect her kidney. -Ordered referral to physical therapy -Follow up as needed  Diabetes mellitus, type 2 (HCC) A1c 9.6 (11.3 three months ago). No symptoms of hypoglycemia or hyperglycemia. -Will continue medications -Gave MAP paper so that she can get her prescriptions from health department. -Foot exam within normal  Chronic combined systolic and diastolic congestive heart failure (HCC) No sign and symptom of fluid overload.  -Continue meds (lasix and lisinopril) -Cardiology referral for reevaluation for ICD   Depression Part of her mood issue is related to her financial struggle. PHQ-9 15. Her affect is not congruent to her mood. She appeared happy and blissful for the whole encounter. Denies SI or HI -Started Zoloft at 50 mg daily -Follow up in two weeks. Will increase dose to 100 mg at that time.  Generalized anxiety disorder She scored 19 on GAD-7 score.  -Started zoloft today.

## 2015-07-21 DIAGNOSIS — M79605 Pain in left leg: Secondary | ICD-10-CM

## 2015-07-21 HISTORY — DX: Pain in left leg: M79.605

## 2015-07-21 MED FILL — traMADol HCL 50 MG TABS: 50 | 10 days supply | Qty: 30 | Fill #0

## 2015-07-21 NOTE — Assessment & Plan Note (Signed)
Part of her mood issue is related to her financial struggle. PHQ-9 15. Her affect is not congruent to her mood. She appeared happy and blissful for the whole encounter. Denies SI or HI -Started Zoloft at 50 mg daily -Follow up in two weeks. Will increase dose to 100 mg at that time.

## 2015-07-21 NOTE — Assessment & Plan Note (Signed)
Unclear etiology. Unilateral nature and distribution make neuropathy and radiculopathy unlikely. No red flags. No significant exam finding except for diminished patellar reflexes bilaterally. -Gave prescription for tramadol -Advised to stop taking NSAID given her cardiac history. This could also affect her kidney. -Ordered referral to physical therapy -Follow up as needed

## 2015-07-21 NOTE — Assessment & Plan Note (Signed)
No sign and symptom of fluid overload.  -Continue meds (lasix and lisinopril) -Cardiology referral for reevaluation for ICD

## 2015-07-21 NOTE — Assessment & Plan Note (Signed)
She scored 19 on GAD-7 score.  -Started zoloft today.

## 2015-07-21 NOTE — Assessment & Plan Note (Signed)
A1c 9.6 (11.3 three months ago). No symptoms of hypoglycemia or hyperglycemia. -Will continue medications -Gave MAP paper so that she can get her prescriptions from health department. -Foot exam within normal

## 2015-07-29 ENCOUNTER — Telehealth: Payer: Self-pay | Admitting: Student

## 2015-07-29 NOTE — Telephone Encounter (Signed)
Attempted to call the patient in regards to her possible medication reaction. Unfortunately, patient don't pick her phone. She also don't have voice mail to leave her a message.

## 2015-07-29 NOTE — Telephone Encounter (Signed)
Patient placed on Zoloft on 07/20/15 and she states that she has started itching and she has swollen lips. Would like to know what she should do. Please advise.

## 2015-07-29 NOTE — Telephone Encounter (Signed)
Return call to patient regarding Zoloft. Advised patient not to take anymore of the medication.  Take benadryl and apply Ice pack to her lips.  Patient denies any problems with breathing.  Will forward to PCP for further advise.  Clovis Pu, RN

## 2015-07-30 ENCOUNTER — Emergency Department (HOSPITAL_COMMUNITY)
Admission: EM | Admit: 2015-07-30 | Discharge: 2015-07-30 | Disposition: A | Discharge status: Home / Self Care | Payer: Medicaid Other | Attending: Emergency Medicine | Admitting: Emergency Medicine

## 2015-07-30 ENCOUNTER — Encounter (HOSPITAL_COMMUNITY): Payer: Self-pay

## 2015-07-30 DIAGNOSIS — Z79899 Other long term (current) drug therapy: Secondary | ICD-10-CM | POA: Diagnosis not present

## 2015-07-30 DIAGNOSIS — E119 Type 2 diabetes mellitus without complications: Secondary | ICD-10-CM | POA: Insufficient documentation

## 2015-07-30 DIAGNOSIS — I429 Cardiomyopathy, unspecified: Secondary | ICD-10-CM | POA: Diagnosis not present

## 2015-07-30 DIAGNOSIS — Z7984 Long term (current) use of oral hypoglycemic drugs: Secondary | ICD-10-CM | POA: Insufficient documentation

## 2015-07-30 DIAGNOSIS — I5020 Unspecified systolic (congestive) heart failure: Secondary | ICD-10-CM | POA: Insufficient documentation

## 2015-07-30 DIAGNOSIS — T7840XA Allergy, unspecified, initial encounter: Secondary | ICD-10-CM | POA: Insufficient documentation

## 2015-07-30 DIAGNOSIS — Z791 Long term (current) use of non-steroidal anti-inflammatories (NSAID): Secondary | ICD-10-CM | POA: Insufficient documentation

## 2015-07-30 DIAGNOSIS — Z794 Long term (current) use of insulin: Secondary | ICD-10-CM | POA: Diagnosis not present

## 2015-07-30 DIAGNOSIS — Z7982 Long term (current) use of aspirin: Secondary | ICD-10-CM | POA: Insufficient documentation

## 2015-07-30 DIAGNOSIS — I110 Hypertensive heart disease with heart failure: Secondary | ICD-10-CM | POA: Insufficient documentation

## 2015-07-30 MED ORDER — CLONIDINE HCL 0.1 MG PO TABS: 0.1000 mg | ORAL_TABLET | Freq: Every day | ORAL | Status: DC

## 2015-07-30 MED ORDER — PREDNISONE 50 MG PO TABS: 50.0000 mg | ORAL_TABLET | Freq: Every day | ORAL | Status: DC

## 2015-07-30 MED ORDER — SODIUM CHLORIDE 0.9 % IV SOLN
INTRAVENOUS | Status: DC
  Administered 2015-07-30: 08:00:00 via INTRAVENOUS

## 2015-07-30 MED ORDER — METHYLPREDNISOLONE SODIUM SUCC 125 MG IJ SOLR
125.0000 mg | Freq: Once | INTRAMUSCULAR | Status: AC
  Administered 2015-07-30: 125 mg via INTRAVENOUS
  Filled 2015-07-30: qty 2

## 2015-07-30 MED ORDER — DIPHENHYDRAMINE HCL 50 MG/ML IJ SOLN
50.0000 mg | Freq: Once | INTRAMUSCULAR | Status: AC
  Administered 2015-07-30: 50 mg via INTRAVENOUS
  Filled 2015-07-30: qty 1

## 2015-07-30 MED ORDER — FAMOTIDINE IN NACL 20-0.9 MG/50ML-% IV SOLN
20.0000 mg | Freq: Once | INTRAVENOUS | Status: AC
  Administered 2015-07-30: 20 mg via INTRAVENOUS
  Filled 2015-07-30: qty 50

## 2015-07-30 NOTE — ED Notes (Signed)
She reports lip edema, plus generalized itching and some edema of bilat. Fingers.  She also states she has a mild sensation of tongue and throat swelling.  She is breathing normally and is in no distress with no visible rash. One of her daily meds is Lisinopril 40mg .

## 2015-07-30 NOTE — ED Notes (Signed)
No angioedema present at discharge.

## 2015-07-30 NOTE — ED Notes (Signed)
Upon entering room pt noted to have angioedema and generalized hives. Pt speaking complete sentences and lungs WNL. Pt reports symptoms onset 1500 07/29/15. New medication change a week ago.

## 2015-07-30 NOTE — ED Notes (Signed)
Pt states "I am starting to feel better."

## 2015-07-30 NOTE — ED Provider Notes (Signed)
CSN: [id number]     Arrival date & time 07/30/15  0709 History   First MD Initiated Contact with Patient 07/30/15 [phone]     Chief Complaint  Patient presents with  . Allergic Reaction     (Consider location/radiation/quality/duration/timing/severity/associated sxs/prior Treatment) HPI Comments: Patient here complaining of one-day history of lower lip edema as well as generalized pruritus with hives as well as swelling of her hands bilateral. A week ago she was started on tramadol as well as Zoloft. She also takes an ACE inhibitor which she has been on for 3 months. Denies any prior known history of allergic reaction. Denies any trouble swallowing at this time. Symptoms have been progressively worse and no medications used for them. Nothing makes her symptoms better.  Patient is a 50 y.o. female presenting with allergic reaction. The history is provided by the patient.  Allergic Reaction   Past Medical History  Diagnosis Date  . Diabetes mellitus, type 2 (HCC)     A1C 7.6 April '13  . Hypertension   . Nonischemic cardiomyopathy (HCC)     Echo 05/19/11 EF 35% w/ mild-mod MR; R/L Heart Cath 05/21/11 - mildly elevated right heart pressures, normal left heart pressures, preserved cardiac output, widely patent coronary arteries without significant CAD and moderate global systolic LV dysfunction, EF 35-40%.  . Systolic CHF (HCC)     Echo 05/19/11 EF 35% w/ mild-mod MR   Past Surgical History  Procedure Laterality Date  . Tubal ligation  1996  . Dilation and curettage of uterus    . Cervix lesion destruction    . Left heart catheterization with coronary angiogram N/A 05/21/2011    Procedure: LEFT HEART CATHETERIZATION WITH CORONARY ANGIOGRAM;  Surgeon: Tonny Bollman, MD;  Location: [facility] CATH LAB;  Service: Cardiovascular;  Laterality: N/A;   Family History  Problem Relation Age of Onset  . Diabetes      multiple  . Heart failure Paternal Grandmother   . Heart disease      multiple   Social  History  Substance Use Topics  . Smoking status: Never Smoker   . Smokeless tobacco: None  . Alcohol Use: No   OB History    No data available     Review of Systems  All other systems reviewed and are negative.     Allergies  Review of patient's allergies indicates no known allergies.  Home Medications   Prior to Admission medications   Medication Sig Start Date End Date Taking? Authorizing Provider  albuterol (PROVENTIL HFA;VENTOLIN HFA) 108 (90 Base) MCG/ACT inhaler Inhale 1 puff into the lungs every 6 (six) hours as needed for wheezing or shortness of breath.    Historical Provider, MD  aspirin 81 MG chewable tablet Chew 1 tablet (81 mg total) by mouth daily. 02/26/15   Almon Hercules, MD  atorvastatin (LIPITOR) 80 MG tablet Take 1 tablet (80 mg total) by mouth daily at 6 PM. 05/05/15   Almon Hercules, MD  carvedilol (COREG) 12.5 MG tablet Take 2 tablets (25 mg total) by mouth 2 (two) times daily with a meal. 05/05/15   Almon Hercules, MD  clopidogrel (PLAVIX) 75 MG tablet Take 1 tablet (75 mg total) by mouth daily. 05/05/15   Almon Hercules, MD  furosemide (LASIX) 40 MG tablet Take 1 tablet (40 mg total) by mouth 2 (two) times daily. 05/05/15   Almon Hercules, MD  ibuprofen (ADVIL,MOTRIN) 200 MG tablet Take 400 mg by mouth every 6 (six)  hours as needed for headache.    Historical Provider, MD  insulin NPH-regular Human (NOVOLIN 70/30) (70-30) 100 UNIT/ML injection Inject 17 units in the AM, inject 8 units in PM 12 hours later 10 minutes before meals 05/05/15   Almon Hercules, MD  lisinopril (PRINIVIL,ZESTRIL) 40 MG tablet Take 1 tablet (40 mg total) by mouth daily. 05/05/15 11/14/18  Almon Hercules, MD  metFORMIN (GLUCOPHAGE) 1000 MG tablet Take 0.5 tablets (500 mg total) by mouth 2 (two) times daily with a meal. Reported on 02/23/2015 05/05/15   Almon Hercules, MD  sertraline (ZOLOFT) 50 MG tablet Take 1 tablet (50 mg total) by mouth daily. 07/20/15   Almon Hercules, MD  traMADol (ULTRAM) 50 MG tablet Take  1 tablet (50 mg total) by mouth every 8 (eight) hours as needed. 07/20/15   Almon Hercules, MD   BP 109/99 mmHg  Pulse 98  Temp(Src) 98.3 F (36.8 C) (Oral)  Resp 18  SpO2 96%  LMP 07/17/2015 (Approximate) Physical Exam  Constitutional: She is oriented to person, place, and time. She appears well-developed and well-nourished.  Non-toxic appearance. No distress.  HENT:  Head: Normocephalic and atraumatic.    No tongue swelling or posterior pharyngeal edema  Eyes: Conjunctivae, EOM and lids are normal. Pupils are equal, round, and reactive to light.  Neck: Normal range of motion. Neck supple. No tracheal deviation present. No thyroid mass present.  Cardiovascular: Normal rate, regular rhythm and normal heart sounds.  Exam reveals no gallop.   No murmur heard. Pulmonary/Chest: Effort normal and breath sounds normal. No stridor. No respiratory distress. She has no decreased breath sounds. She has no wheezes. She has no rhonchi. She has no rales.  Abdominal: Soft. Normal appearance and bowel sounds are normal. She exhibits no distension. There is no tenderness. There is no rebound and no CVA tenderness.  Musculoskeletal: Normal range of motion. She exhibits no edema or tenderness.  Neurological: She is alert and oriented to person, place, and time. She has normal strength. No cranial nerve deficit or sensory deficit. GCS eye subscore is 4. GCS verbal subscore is 5. GCS motor subscore is 6.  Skin: Skin is warm and dry. Rash noted. No abrasion noted. Rash is urticarial.     Psychiatric: She has a normal mood and affect. Her speech is normal and behavior is normal.  Nursing note and vitals reviewed.   ED Course  Procedures (including critical care time) Labs Review Labs Reviewed - No data to display  Imaging Review No results found. I have personally reviewed and evaluated these images and lab results as part of my medical decision-making.   EKG Interpretation None      MDM   Final  diagnoses:  None    Patient treated with Solu-Medrol , Benadryl, Pepcid and her rash and lip edema has greatly improved. She has been instructed to stop taking her tramadol, Zoloft, lisinopril. Will place on prednisone for 2 days and patient has been instructed to take Benadryl and follow-up with her doctor Monday so they can add a new antihypertensive medication.  CRITICAL CARE Performed by: Toy Baker Total critical care time: 45 minutes Critical care time was exclusive of separately billable procedures and treating other patients. Critical care was necessary to treat or prevent imminent or life-threatening deterioration. Critical care was time spent personally by me on the following activities: development of treatment plan with patient and/or surrogate as well as nursing, discussions with consultants, evaluation of patient's response  to treatment, examination of patient, obtaining history from patient or surrogate, ordering and performing treatments and interventions, ordering and review of laboratory studies, ordering and review of radiographic studies, pulse oximetry and re-evaluation of patient's condition.     Lorre Nick, MD 07/30/15 1030

## 2015-07-30 NOTE — Discharge Instructions (Signed)
Follow-up with your doctor on Monday to be started on a new blood pressure medication. Use Benadryl as directed for the next 2-3 days Allergies An allergy is an abnormal reaction to a substance by the body's defense system (immune system). Allergies can develop at any age. WHAT CAUSES ALLERGIES? An allergic reaction happens when the immune system mistakenly reacts to a normally harmless substance, called an allergen, as if it were harmful. The immune system releases antibodies to fight the substance. Antibodies eventually release a chemical called histamine into the bloodstream. The release of histamine is meant to protect the body from infection, but it also causes discomfort. An allergic reaction can be triggered by:  Eating an allergen.  Inhaling an allergen.  Touching an allergen. WHAT TYPES OF ALLERGIES ARE THERE? There are many types of allergies. Common types include:  Seasonal allergies. People with this type of allergy are usually allergic to substances that are only present during certain seasons, such as molds and pollens.  Food allergies.  Drug allergies.  Insect allergies.  Animal dander allergies. WHAT ARE SYMPTOMS OF ALLERGIES? Possible allergy symptoms include:  Swelling of the lips, face, tongue, mouth, or throat.  Sneezing, coughing, or wheezing.  Nasal congestion.  Tingling in the mouth.  Rash.  Itching.  Itchy, red, swollen areas of skin (hives).  Watery eyes.  Vomiting.  Diarrhea.  Dizziness.  Lightheadedness.  Fainting.  Trouble breathing or swallowing.  Chest tightness.  Rapid heartbeat. HOW ARE ALLERGIES DIAGNOSED? Allergies are diagnosed with a medical and family history and one or more of the following:  Skin tests.  Blood tests.  A food diary. A food diary is a record of all the foods and drinks you have in a day and of all the symptoms you experience.  The results of an elimination diet. An elimination diet involves  eliminating foods from your diet and then adding them back in one by one to find out if a certain food causes an allergic reaction. HOW ARE ALLERGIES TREATED? There is no cure for allergies, but allergic reactions can be treated with medicine. Severe reactions usually need to be treated at a hospital. HOW CAN REACTIONS BE PREVENTED? The best way to prevent an allergic reaction is by avoiding the substance you are allergic to. Allergy shots and medicines can also help prevent reactions in some cases. People with severe allergic reactions may be able to prevent a life-threatening reaction called anaphylaxis with a medicine given right after exposure to the allergen.   This information is not intended to replace advice given to you by your health care provider. Make sure you discuss any questions you have with your health care provider.   Document Released: 04/24/2002 Document Revised: 02/19/2014 Document Reviewed: 11/10/2013 Elsevier Interactive Patient Education [employer]

## 2015-08-01 ENCOUNTER — Ambulatory Visit: Payer: Self-pay | Admitting: Physical Therapy

## 2015-08-01 ENCOUNTER — Encounter: Payer: Self-pay | Admitting: Family Medicine

## 2015-08-01 ENCOUNTER — Ambulatory Visit (INDEPENDENT_AMBULATORY_CARE_PROVIDER_SITE_OTHER): Payer: Self-pay | Admitting: Family Medicine

## 2015-08-01 VITALS — BP 150/97 | HR 99 | Temp 98.5°F | Wt 219.4 lb

## 2015-08-01 DIAGNOSIS — L509 Urticaria, unspecified: Secondary | ICD-10-CM

## 2015-08-01 DIAGNOSIS — L508 Other urticaria: Secondary | ICD-10-CM

## 2015-08-01 MED ORDER — PREDNISONE 10 MG PO TABS: 10.0000 mg | ORAL_TABLET | Freq: Every day | ORAL | Status: DC

## 2015-08-01 NOTE — Assessment & Plan Note (Addendum)
Persistent hives and itching without signs of angioedema, difficulty breathing or swallowing.  Hives most likely reaction to recent new medications Ultram and Zoloft; However, agree with discontinuing lisinopril.  - Blood pressure mildly elevated today, but has not taken any medication.  Advised restarting Coreg, furosemide, and following up with cardiologist Friday as scheduled.  Could consider starting ARB given her HFrEF - Advise continuing Lantus while she is on steroids, and restarting metformin - We will continue steroids given persistent hives; decrease to 10 mg daily 5 days due to poorly controlled DM and HFrEF - Advised her report, ED if she develops trouble breathing, swallowing or face swelling - Keep appointment with PCP in one week, if hives persist, consider additional evaluation as indicated

## 2015-08-01 NOTE — Progress Notes (Signed)
   Subjective:    Patient ID: [name], female    DOB: 1965/11/17, 50 y.o.   MRN: [id number]  Seen for Same day visit for   CC: Follow-up from ED visit due to hives  She continues to report hives on her chest, bilateral arms and thighs, as well as continues to feel like her eyelids are somewhat swollen.  She reports complete resolution of her lips swelling and denies any difficulty breathing or swallowing.  No chest pain, palpitations.  Denies any recent fevers or illnesses.  Denies any recent sick contacts, cough, runny nose, or diarrhea.  She has stopped taken all medications except for Lantus.  She continues to have itching from hives and takes Benadryl 3-4 times daily.  Denies any previous allergic reactions to medications or foods.  She has appointment Friday with her cardiologist.  Denies any extremely elevated blood sugars or worsening swelling since she has been on prednisone which she took her last dose today.   Smoking history noted  Review of Systems   See HPI for ROS. Objective:  BP 150/97 mmHg  Pulse 99  Temp(Src) 98.5 F (36.9 C) (Oral)  Wt 219 lb 6.4 oz (99.519 kg)  SpO2 100%  LMP 07/17/2015 (Approximate)  General: NAD Cardiac: RRR, normal heart sounds, no murmurs. 2+ radial and PT pulses bilaterally Respiratory: CTAB, normal effort Abdomen: soft, nontender, nondistended,  Bowel sounds present Extremities: no edema or cyanosis. WWP. Skin: Hives noted on anterior chest, forearms and thighs; no swelling of eyelids or lips noted Neuro: alert and oriented, no focal deficits    Assessment & Plan:   Full body hives Persistent hives and itching without signs of angioedema, difficulty breathing or swallowing.  Hives most likely reaction to recent new medications Ultram and Zoloft; However, agree with discontinuing lisinopril.  - Blood pressure mildly elevated today, but has not taken any medication.  Advised restarting Coreg, furosemide, and following up with  cardiologist Friday as scheduled.  Could consider starting ARB given her HFrEF - Advise continuing Lantus while she is on steroids, and restarting metformin - We will continue steroids given persistent hives; decrease to 10 mg daily 5 days due to poorly controlled DM and HFrEF - Advised her report, ED if she develops trouble breathing, swallowing or face swelling - Keep appointment with PCP in one week, if hives persist, consider additional evaluation as indicated

## 2015-08-01 NOTE — Patient Instructions (Addendum)
Start prednisone 10 mg daily for the next 4-5 days until hives resolves.  - Take allergy medicine:  Claritin, Allegra, Zyrtec daily as needed for itching; he can also use topical Benadryl cream as and cold compresses  - Keep your appointment with your cardiologist Friday to discuss your pressure medication, and possibly start ARB (Cozaar) - In the meantime restart your other heart failure and blood pressure medications  - Keep your appointment with Dr. Alanda Slim for the following Wednesday

## 2015-08-02 ENCOUNTER — Telehealth: Payer: Self-pay | Admitting: Student

## 2015-08-02 DIAGNOSIS — L509 Urticaria, unspecified: Secondary | ICD-10-CM

## 2015-08-02 MED ORDER — PREDNISONE 10 MG PO TABS: 10.0000 mg | ORAL_TABLET | Freq: Every day | ORAL | Status: DC

## 2015-08-02 NOTE — Telephone Encounter (Signed)
Medication resent to correct pharmacy.  Mang Hazelrigg,CMA  

## 2015-08-02 NOTE — Telephone Encounter (Signed)
Prednisone prescribed by Dr. Gayla Doss on 08/01/15 was sent to wrong pharmacy. Please resend medication to [facility] on [address].

## 2015-08-05 ENCOUNTER — Ambulatory Visit: Payer: Self-pay | Admitting: Physician Assistant

## 2015-08-09 ENCOUNTER — Ambulatory Visit: Payer: Self-pay | Admitting: Physical Therapy

## 2015-08-10 ENCOUNTER — Ambulatory Visit: Payer: Self-pay | Admitting: Student

## 2016-05-23 ENCOUNTER — Telehealth: Payer: Self-pay | Admitting: Student

## 2016-06-10 ENCOUNTER — Encounter (HOSPITAL_COMMUNITY): Payer: Self-pay | Admitting: Emergency Medicine

## 2016-06-10 ENCOUNTER — Emergency Department (HOSPITAL_COMMUNITY): Payer: Medicaid Other

## 2016-06-10 ENCOUNTER — Inpatient Hospital Stay (HOSPITAL_COMMUNITY)
Admission: EM | Admit: 2016-06-10 | Discharge: 2016-06-12 | DRG: 292 | Disposition: A | Discharge status: Home / Self Care | Payer: Medicaid Other | Attending: Internal Medicine | Admitting: Internal Medicine

## 2016-06-10 DIAGNOSIS — Z8679 Personal history of other diseases of the circulatory system: Secondary | ICD-10-CM | POA: Diagnosis not present

## 2016-06-10 DIAGNOSIS — R778 Other specified abnormalities of plasma proteins: Secondary | ICD-10-CM | POA: Diagnosis present

## 2016-06-10 DIAGNOSIS — Z888 Allergy status to other drugs, medicaments and biological substances status: Secondary | ICD-10-CM

## 2016-06-10 DIAGNOSIS — I5021 Acute systolic (congestive) heart failure: Secondary | ICD-10-CM

## 2016-06-10 DIAGNOSIS — E119 Type 2 diabetes mellitus without complications: Secondary | ICD-10-CM

## 2016-06-10 DIAGNOSIS — Z8249 Family history of ischemic heart disease and other diseases of the circulatory system: Secondary | ICD-10-CM

## 2016-06-10 DIAGNOSIS — Z833 Family history of diabetes mellitus: Secondary | ICD-10-CM | POA: Diagnosis not present

## 2016-06-10 DIAGNOSIS — J90 Pleural effusion, not elsewhere classified: Secondary | ICD-10-CM | POA: Diagnosis present

## 2016-06-10 DIAGNOSIS — E785 Hyperlipidemia, unspecified: Secondary | ICD-10-CM | POA: Diagnosis present

## 2016-06-10 DIAGNOSIS — I110 Hypertensive heart disease with heart failure: Secondary | ICD-10-CM | POA: Diagnosis not present

## 2016-06-10 DIAGNOSIS — I10 Essential (primary) hypertension: Secondary | ICD-10-CM | POA: Diagnosis present

## 2016-06-10 DIAGNOSIS — R911 Solitary pulmonary nodule: Secondary | ICD-10-CM | POA: Diagnosis present

## 2016-06-10 DIAGNOSIS — Z9114 Patient's other noncompliance with medication regimen: Secondary | ICD-10-CM | POA: Diagnosis not present

## 2016-06-10 DIAGNOSIS — Z885 Allergy status to narcotic agent status: Secondary | ICD-10-CM | POA: Diagnosis not present

## 2016-06-10 DIAGNOSIS — E1165 Type 2 diabetes mellitus with hyperglycemia: Secondary | ICD-10-CM | POA: Diagnosis present

## 2016-06-10 DIAGNOSIS — E1169 Type 2 diabetes mellitus with other specified complication: Secondary | ICD-10-CM | POA: Diagnosis present

## 2016-06-10 DIAGNOSIS — R7989 Other specified abnormal findings of blood chemistry: Secondary | ICD-10-CM | POA: Diagnosis present

## 2016-06-10 DIAGNOSIS — I248 Other forms of acute ischemic heart disease: Secondary | ICD-10-CM | POA: Diagnosis present

## 2016-06-10 DIAGNOSIS — R000 Tachycardia, unspecified: Secondary | ICD-10-CM | POA: Diagnosis present

## 2016-06-10 DIAGNOSIS — E876 Hypokalemia: Secondary | ICD-10-CM | POA: Diagnosis present

## 2016-06-10 DIAGNOSIS — I5022 Chronic systolic (congestive) heart failure: Secondary | ICD-10-CM

## 2016-06-10 DIAGNOSIS — Z886 Allergy status to analgesic agent status: Secondary | ICD-10-CM | POA: Diagnosis not present

## 2016-06-10 DIAGNOSIS — I420 Dilated cardiomyopathy: Secondary | ICD-10-CM | POA: Diagnosis present

## 2016-06-10 DIAGNOSIS — I152 Hypertension secondary to endocrine disorders: Secondary | ICD-10-CM | POA: Diagnosis present

## 2016-06-10 DIAGNOSIS — R748 Abnormal levels of other serum enzymes: Secondary | ICD-10-CM

## 2016-06-10 DIAGNOSIS — I428 Other cardiomyopathies: Secondary | ICD-10-CM | POA: Diagnosis present

## 2016-06-10 DIAGNOSIS — I5043 Acute on chronic combined systolic (congestive) and diastolic (congestive) heart failure: Secondary | ICD-10-CM | POA: Diagnosis present

## 2016-06-10 DIAGNOSIS — R0602 Shortness of breath: Secondary | ICD-10-CM | POA: Diagnosis not present

## 2016-06-10 DIAGNOSIS — Z8673 Personal history of transient ischemic attack (TIA), and cerebral infarction without residual deficits: Secondary | ICD-10-CM | POA: Diagnosis not present

## 2016-06-10 HISTORY — DX: Cerebral infarction, unspecified: I63.9

## 2016-06-10 HISTORY — DX: Pain in left leg: M79.605

## 2016-06-10 LAB — CBC WITH DIFFERENTIAL/PLATELET
Basophils Absolute: 0 10*3/uL (ref 0.0–0.1)
Basophils Relative: 0 %
EOS ABS: 0.1 10*3/uL (ref 0.0–0.7)
EOS PCT: 1 %
HCT: 38 % (ref 36.0–46.0)
Hemoglobin: 12.8 g/dL (ref 12.0–15.0)
LYMPHS ABS: 3.1 10*3/uL (ref 0.7–4.0)
LYMPHS PCT: 39 %
MCH: 29 pg (ref 26.0–34.0)
MCHC: 33.7 g/dL (ref 30.0–36.0)
MCV: 86.2 fL (ref 78.0–100.0)
MONOS PCT: 4 %
Monocytes Absolute: 0.3 10*3/uL (ref 0.1–1.0)
Neutro Abs: 4.6 10*3/uL (ref 1.7–7.7)
Neutrophils Relative %: 56 %
Platelets: 316 10*3/uL (ref 150–400)
RBC: 4.41 MIL/uL (ref 3.87–5.11)
RDW: 13.8 % (ref 11.5–15.5)
WBC: 8.1 10*3/uL (ref 4.0–10.5)

## 2016-06-10 LAB — CBG MONITORING, ED
GLUCOSE-CAPILLARY: 331 mg/dL — AB (ref 65–99)
GLUCOSE-CAPILLARY: 331 mg/dL — AB (ref 65–99)

## 2016-06-10 LAB — COMPREHENSIVE METABOLIC PANEL
ALBUMIN: 3.5 g/dL (ref 3.5–5.0)
ALT: 38 U/L (ref 14–54)
AST: 37 U/L (ref 15–41)
Alkaline Phosphatase: 128 U/L — ABNORMAL HIGH (ref 38–126)
Anion gap: 9 (ref 5–15)
BUN: 14 mg/dL (ref 6–20)
CO2: 22 mmol/L (ref 22–32)
CREATININE: 0.81 mg/dL (ref 0.44–1.00)
Calcium: 8.8 mg/dL — ABNORMAL LOW (ref 8.9–10.3)
Chloride: 100 mmol/L — ABNORMAL LOW (ref 101–111)
GFR calc Af Amer: >60 mL/min (ref >60)
GFR calc non Af Amer: >60 mL/min (ref >60)
GLUCOSE: 375 mg/dL — AB (ref 65–99)
POTASSIUM: 4 mmol/L (ref 3.5–5.1)
SODIUM: 131 mmol/L — AB (ref 135–145)
Total Bilirubin: 1.2 mg/dL (ref 0.3–1.2)
Total Protein: 6.8 g/dL (ref 6.5–8.1)

## 2016-06-10 LAB — RAPID URINE DRUG SCREEN, HOSP PERFORMED
Amphetamines: NOT DETECTED
BENZODIAZEPINES: NOT DETECTED
Barbiturates: NOT DETECTED
COCAINE: NOT DETECTED
Opiates: NOT DETECTED
Tetrahydrocannabinol: NOT DETECTED

## 2016-06-10 LAB — MAGNESIUM: MAGNESIUM: 1.6 mg/dL — AB (ref 1.7–2.4)

## 2016-06-10 LAB — BRAIN NATRIURETIC PEPTIDE: B Natriuretic Peptide: 534.8 pg/mL — ABNORMAL HIGH (ref 0.0–100.0)

## 2016-06-10 LAB — I-STAT TROPONIN, ED: Troponin i, poc: 0.38 ng/mL (ref 0.00–0.08)

## 2016-06-10 LAB — GLUCOSE, CAPILLARY
GLUCOSE-CAPILLARY: 226 mg/dL — AB (ref 65–99)
Glucose-Capillary: 106 mg/dL — ABNORMAL HIGH (ref 65–99)

## 2016-06-10 LAB — TSH: TSH: 1.895 u[IU]/mL (ref 0.350–4.500)

## 2016-06-10 LAB — ETHANOL: Alcohol, Ethyl (B): <5 mg/dL (ref <5)

## 2016-06-10 MED ORDER — ATORVASTATIN CALCIUM 80 MG PO TABS
80.0000 mg | ORAL_TABLET | Freq: Every day | ORAL | Status: DC
  Administered 2016-06-10 – 2016-06-11 (×2): 80 mg via ORAL
  Filled 2016-06-10 (×2): qty 1
  Filled 2016-06-10: qty 2
  Filled 2016-06-10: qty 1
  Filled 2016-06-10: qty 2

## 2016-06-10 MED ORDER — SODIUM CHLORIDE 0.9% FLUSH
3.0000 mL | Freq: Two times a day (BID) | INTRAVENOUS | Status: DC
  Administered 2016-06-10 – 2016-06-12 (×4): 3 mL via INTRAVENOUS

## 2016-06-10 MED ORDER — FUROSEMIDE 10 MG/ML IJ SOLN
40.0000 mg | Freq: Every day | INTRAMUSCULAR | Status: DC
  Administered 2016-06-10 – 2016-06-11 (×2): 40 mg via INTRAVENOUS
  Filled 2016-06-10 (×2): qty 4

## 2016-06-10 MED ORDER — SODIUM CHLORIDE 0.9 % IV SOLN: 250.0000 mL | INTRAVENOUS | Status: DC | PRN

## 2016-06-10 MED ORDER — CLONIDINE HCL 0.1 MG PO TABS
0.1000 mg | ORAL_TABLET | Freq: Once | ORAL | Status: AC
  Administered 2016-06-10: 0.1 mg via ORAL
  Filled 2016-06-10: qty 1

## 2016-06-10 MED ORDER — PNEUMOCOCCAL VAC POLYVALENT 25 MCG/0.5ML IJ INJ
0.5000 mL | INJECTION | INTRAMUSCULAR | Status: DC
  Filled 2016-06-10: qty 0.5

## 2016-06-10 MED ORDER — IOPAMIDOL (ISOVUE-370) INJECTION 76%
100.0000 mL | Freq: Once | INTRAVENOUS | Status: AC | PRN
  Administered 2016-06-10: 100 mL via INTRAVENOUS

## 2016-06-10 MED ORDER — CLOPIDOGREL BISULFATE 75 MG PO TABS
75.0000 mg | ORAL_TABLET | Freq: Every day | ORAL | Status: DC
Start: 2016-06-10 — End: 2016-06-11
  Administered 2016-06-10 – 2016-06-11 (×2): 75 mg via ORAL
  Filled 2016-06-10 (×2): qty 1

## 2016-06-10 MED ORDER — CARVEDILOL 12.5 MG PO TABS
12.5000 mg | ORAL_TABLET | Freq: Two times a day (BID) | ORAL | Status: DC
  Administered 2016-06-10 – 2016-06-11 (×2): 12.5 mg via ORAL
  Filled 2016-06-10 (×2): qty 1

## 2016-06-10 MED ORDER — ACETAMINOPHEN 325 MG PO TABS
650.0000 mg | ORAL_TABLET | ORAL | Status: DC | PRN
  Administered 2016-06-10: 650 mg via ORAL
  Filled 2016-06-10: qty 2

## 2016-06-10 MED ORDER — INSULIN ASPART PROT & ASPART (70-30 MIX) 100 UNIT/ML ~~LOC~~ SUSP
8.0000 [IU] | Freq: Two times a day (BID) | SUBCUTANEOUS | Status: DC
  Administered 2016-06-10 – 2016-06-12 (×4): 8 [IU] via SUBCUTANEOUS

## 2016-06-10 MED ORDER — SODIUM CHLORIDE 0.9% FLUSH: 3.0000 mL | INTRAVENOUS | Status: DC | PRN

## 2016-06-10 MED ORDER — ASPIRIN 81 MG PO CHEW
81.0000 mg | CHEWABLE_TABLET | Freq: Every day | ORAL | Status: DC
  Administered 2016-06-10 – 2016-06-12 (×3): 81 mg via ORAL
  Filled 2016-06-10 (×3): qty 1

## 2016-06-10 MED ORDER — IOPAMIDOL (ISOVUE-370) INJECTION 76%
INTRAVENOUS | Status: AC
  Filled 2016-06-10: qty 100

## 2016-06-10 MED ORDER — INSULIN ASPART PROT & ASPART (70-30 MIX) 100 UNIT/ML ~~LOC~~ SUSP
8.0000 [IU] | Freq: Once | SUBCUTANEOUS | Status: AC
  Administered 2016-06-10: 8 [IU] via SUBCUTANEOUS
  Filled 2016-06-10: qty 10

## 2016-06-10 MED ORDER — INSULIN ASPART 100 UNIT/ML ~~LOC~~ SOLN
0.0000 [IU] | Freq: Three times a day (TID) | SUBCUTANEOUS | Status: DC
  Administered 2016-06-10 – 2016-06-11 (×2): 3 [IU] via SUBCUTANEOUS
  Administered 2016-06-11: 1 [IU] via SUBCUTANEOUS
  Administered 2016-06-11: 2 [IU] via SUBCUTANEOUS
  Administered 2016-06-12: 3 [IU] via SUBCUTANEOUS
  Administered 2016-06-12: 2 [IU] via SUBCUTANEOUS

## 2016-06-10 MED ORDER — FUROSEMIDE 40 MG PO TABS
40.0000 mg | ORAL_TABLET | Freq: Once | ORAL | Status: AC
  Administered 2016-06-10: 40 mg via ORAL
  Filled 2016-06-10: qty 1

## 2016-06-10 MED ORDER — ONDANSETRON HCL 4 MG/2ML IJ SOLN: 4.0000 mg | Freq: Four times a day (QID) | INTRAMUSCULAR | Status: DC | PRN

## 2016-06-10 MED ORDER — ENOXAPARIN SODIUM 60 MG/0.6ML ~~LOC~~ SOLN
50.0000 mg | SUBCUTANEOUS | Status: DC
  Administered 2016-06-10 – 2016-06-11 (×2): 50 mg via SUBCUTANEOUS
  Filled 2016-06-10 (×2): qty 0.6

## 2016-06-10 NOTE — ED Notes (Signed)
Patient transported to CT 

## 2016-06-10 NOTE — ED Notes (Signed)
ED Provider at bedside. 

## 2016-06-10 NOTE — ED Provider Notes (Signed)
[facility]-EMERGENCY DEPT Provider Note   CSN: [id number] Arrival date & time: 06/10/16  9675     History   Chief Complaint Chief Complaint  Patient presents with  . Shortness of Breath  . Medication Refill    HPI [name] is a 51 y.o. female.  Patient, with with past medical history of HTN, HFrEF, diabetes presents with shortness of breath, cough, bilateral leg swelling that has been going on for 3-4 weeks that has worsened this week. Patient states that she has been out of her blood pressure medication, insulin and fluid pill for the past 2 months. She has not seen a primary care doctor in about a year. She states that she has not had her medications refilled because she no longer has insurance. She states that she has shortness of breath even with walking short distances to the bathroom or kitchen. No relief with albuterol inhaler usage. She states there is a cough that is productive with clear phlegm and associated chest pain when she coughs. She experienced some right-sided calf pain yesterday. Patient states that she is unsure if she has ever been on a anticoagulant she states that she has no prior history of DVT or PE. Also reports palpitations and tingling in R hand and bottom of feet bilaterally. Denies hemoptysis, abdominal pain, diarrhea, vomiting, urinary complaints, vision changes, headaches, fevers, recent injury, recent surgery, prolonged travel, history of cancer, exogenous estrogen use.      Past Medical History:  Diagnosis Date  . Diabetes mellitus, type 2 (HCC)    A1C 7.6 April '13  . Hypertension   . Left leg pain 07/21/2015  . Nonischemic cardiomyopathy (HCC)    Echo 05/19/11 EF 35% w/ mild-mod MR; R/L Heart Cath 05/21/11 - mildly elevated right heart pressures, normal left heart pressures, preserved cardiac output, widely patent coronary arteries without significant CAD and moderate global systolic LV dysfunction, EF 35-40%.  . Systolic CHF (HCC)    Echo 05/19/11 EF  35% w/ mild-mod MR    Patient Active Problem List   Diagnosis Date Noted  . Acute on chronic combined systolic and diastolic CHF (congestive heart failure) (HCC) 06/10/2016  . Benign essential HTN 06/10/2016  . Dyslipidemia associated with type 2 diabetes mellitus (HCC) 06/10/2016  . Bilateral pleural effusion 06/10/2016    Past Surgical History:  Procedure Laterality Date  . CERVIX LESION DESTRUCTION    . DILATION AND CURETTAGE OF UTERUS    . LEFT HEART CATHETERIZATION WITH CORONARY ANGIOGRAM N/A 05/21/2011   Procedure: LEFT HEART CATHETERIZATION WITH CORONARY ANGIOGRAM;  Surgeon: Tonny Bollman, MD;  Location: [facility] CATH LAB;  Service: Cardiovascular;  Laterality: N/A;  . TUBAL LIGATION  1996    OB History    No data available       Home Medications    Prior to Admission medications   Medication Sig Start Date End Date Taking? Authorizing Provider  albuterol (PROVENTIL HFA;VENTOLIN HFA) 108 (90 Base) MCG/ACT inhaler Inhale 1 puff into the lungs every 6 (six) hours as needed for wheezing or shortness of breath.   Yes Historical Provider, MD  aspirin 81 MG chewable tablet Chew 1 tablet (81 mg total) by mouth daily. Patient not taking: Reported on 06/10/2016 02/26/15   Almon Hercules, MD  atorvastatin (LIPITOR) 80 MG tablet Take 1 tablet (80 mg total) by mouth daily at 6 PM. Patient not taking: Reported on 06/10/2016 05/05/15   Almon Hercules, MD  carvedilol (COREG) 12.5 MG tablet Take 2 tablets (25  mg total) by mouth 2 (two) times daily with a meal. Patient not taking: Reported on 06/10/2016 05/05/15   Almon Hercules, MD  cloNIDine (CATAPRES) 0.1 MG tablet Take 1 tablet (0.1 mg total) by mouth daily. Patient not taking: Reported on 06/10/2016 07/30/15   Lorre Nick, MD  clopidogrel (PLAVIX) 75 MG tablet Take 1 tablet (75 mg total) by mouth daily. Patient not taking: Reported on 06/10/2016 05/05/15   Almon Hercules, MD  furosemide (LASIX) 40 MG tablet Take 1 tablet (40 mg total) by mouth 2 (two)  times daily. Patient not taking: Reported on 06/10/2016 05/05/15   Almon Hercules, MD  insulin NPH-regular Human (NOVOLIN 70/30) (70-30) 100 UNIT/ML injection Inject 17 units in the AM, inject 8 units in PM 12 hours later 10 minutes before meals Patient not taking: Reported on 06/10/2016 05/05/15   Almon Hercules, MD  metFORMIN (GLUCOPHAGE) 1000 MG tablet Take 0.5 tablets (500 mg total) by mouth 2 (two) times daily with a meal. Reported on 02/23/2015 Patient not taking: Reported on 06/10/2016 05/05/15   Almon Hercules, MD  predniSONE (DELTASONE) 10 MG tablet Take 1 tablet (10 mg total) by mouth daily with breakfast. Patient not taking: Reported on 06/10/2016 08/02/15   Jamal Collin, MD    Family History Family History  Problem Relation Age of Onset  . Diabetes      multiple  . Heart failure Paternal Grandmother   . Heart disease      multiple    Social History Social History  Substance Use Topics  . Smoking status: Never Smoker  . Smokeless tobacco: Not on file  . Alcohol use No     Allergies   Lisinopril; Sertraline; Tramadol; and Ibuprofen   Review of Systems Review of Systems  Constitutional: Positive for fever. Negative for appetite change and chills.  HENT: Positive for rhinorrhea. Negative for ear pain, sneezing and sore throat.   Eyes: Negative for photophobia and visual disturbance.  Respiratory: Positive for cough and shortness of breath. Negative for chest tightness and wheezing.   Cardiovascular: Positive for chest pain, palpitations and leg swelling.  Gastrointestinal: Positive for constipation. Negative for abdominal pain, blood in stool, diarrhea, nausea and vomiting.  Endocrine: Negative for polydipsia and polyuria.  Genitourinary: Negative for dysuria, hematuria and urgency.  Musculoskeletal: Negative for back pain, gait problem and myalgias.  Skin: Negative for rash.  Neurological: Negative for dizziness, weakness and light-headedness.     Physical Exam Updated  Vital Signs BP (!) 139/93   Pulse (!) 105   Temp 97.8 F (36.6 C) (Oral)   Resp (!) 24   Ht 5\' 9"  (1.753 m)   Wt 100.2 kg   LMP 05/27/2016   SpO2 100%   BMI 32.64 kg/m   Physical Exam  Constitutional: She appears well-developed and well-nourished. No distress.  HENT:  Head: Normocephalic and atraumatic.  Nose: Nose normal.  Eyes: Conjunctivae and EOM are normal. Right eye exhibits no discharge. Left eye exhibits no discharge. No scleral icterus.  Neck: Normal range of motion. Neck supple.  Cardiovascular: Regular rhythm, normal heart sounds and intact distal pulses.  Tachycardia present.  Exam reveals no gallop and no friction rub.   No murmur heard. Pulmonary/Chest: Effort normal and breath sounds normal. No respiratory distress.  Abdominal: Soft. Bowel sounds are normal. She exhibits no distension. There is no tenderness. There is no guarding.  Musculoskeletal: Normal range of motion. She exhibits no edema.  Bilateral pitting edema in  ankles/feet with R slightly > L. Mild calf tenderness on R side. No temperature changes, color changes or area of injury. Negative Homan's sign. Good DP pulses bilaterally. Normal gait.  Neurological: She is alert. No sensory deficit. She exhibits normal muscle tone. Coordination normal.  Skin: Skin is warm and dry. No rash noted.  Psychiatric: She has a normal mood and affect.  Nursing note and vitals reviewed.    ED Treatments / Results  Labs (all labs ordered are listed, but only abnormal results are displayed) Labs Reviewed  COMPREHENSIVE METABOLIC PANEL - Abnormal; Notable for the following:       Result Value   Sodium 131 (*)    Chloride 100 (*)    Glucose, Bld 375 (*)    Calcium 8.8 (*)    Alkaline Phosphatase 128 (*)    All other components within normal limits  I-STAT TROPOININ, ED - Abnormal; Notable for the following:    Troponin i, poc 0.38 (*)    All other components within normal limits  CBG MONITORING, ED - Abnormal;  Notable for the following:    Glucose-Capillary 331 (*)    All other components within normal limits  CBG MONITORING, ED - Abnormal; Notable for the following:    Glucose-Capillary 331 (*)    All other components within normal limits  CBC WITH DIFFERENTIAL/PLATELET  ETHANOL  RAPID URINE DRUG SCREEN, HOSP PERFORMED  HEMOGLOBIN A1C  HIV ANTIBODY (ROUTINE TESTING)  CBC  CREATININE, SERUM  BRAIN NATRIURETIC PEPTIDE  TSH  MAGNESIUM    EKG  EKG Interpretation  Date/Time:  Sunday June 10 2016 08:11:53 EDT Ventricular Rate:  103 PR Interval:    QRS Duration: 78 QT Interval:  411 QTC Calculation: 538 R Axis:   83 Text Interpretation:  Sinus tachycardia Nonspecific T wave abnormality Prolonged QT interval Confirmed by Denton Lank  MD, Caryn Bee ([id number]) on 06/10/2016 8:40:18 AM       Radiology Dg Chest 2 View  Result Date: 06/10/2016 CLINICAL DATA:  Cough and shortness of breath. Diabetes and hypertension. EXAM: CHEST  2 VIEW COMPARISON:  03/13/2015 FINDINGS: The heart size and mediastinal contours are within normal limits. Both lungs are clear. The visualized skeletal structures are unremarkable. IMPRESSION: No active cardiopulmonary disease. Electronically Signed   By: Myles Rosenthal M.D.   On: 06/10/2016 08:21   Ct Angio Chest Pe W/cm &/or Wo Cm  Result Date: 06/10/2016 CLINICAL DATA:  Shortness of breath and chest pain EXAM: CT ANGIOGRAPHY CHEST WITH CONTRAST TECHNIQUE: Multidetector CT imaging of the chest was performed using the standard protocol during bolus administration of intravenous contrast. Multiplanar CT image reconstructions and MIPs were obtained to evaluate the vascular anatomy. CONTRAST:  100 mL Isovue 370 nonionic COMPARISON:  Chest radiograph June 10, 2016 and chest CT May 18, 2011 FINDINGS: Cardiovascular: There is no demonstrable pulmonary embolus. There is no thoracic aortic aneurysm. No dissection is seen; the contrast bolus is was timed to optimize pulmonary arterial  vascular visualization and does not sufficiently opacified the thoracic aorta to confidently exclude dissection as a differential consideration based on imaging appearance. Visualized great vessels appear unremarkable. There are scattered foci of atherosclerotic calcification in the aorta. Pericardium is not appreciably thickened. There are scattered foci of coronary artery calcification. Mediastinum/Nodes: Thyroid appears unremarkable. There are several lymph nodes adjacent to the aortic arch on the left which are present. One of these lymph nodes is borderline prominent measuring 1.2 x 1.0 cm. Other lymph nodes in this area are subcentimeter.  There is a sub- carinal lymph node measuring 1.8 x 1.3 cm. There is a small amount of air in the distal esophagus with a small hiatal hernia. Lungs/Pleura: There are scattered nodular appearing opacities bilaterally, significantly less pronounced than on prior study from 2013. Several of these nodular opacities have a somewhat semi-solid appearance. Nodular opacities range in size from as small as 3 mm to as large as 1.5 x 1 cm. These nodular lesions are seen throughout the lungs bilaterally. There are pleural effusions bilaterally, larger on the right than on the left. There is no new opacity in the lungs compared to the previous study. Upper Abdomen: There is reflux of contrast into the inferior vena cava and hepatic veins. Visualized upper abdominal structures otherwise appear unremarkable except for aortic atherosclerosis. Musculoskeletal: There are no blastic or lytic bone lesions. Review of the MIP images confirms the above findings. IMPRESSION: No demonstrable pulmonary embolus. No thoracic aortic aneurysm. While no aortic dissection is evident, the contrast bolus is not sufficient in the aorta to exclude this entity is a potential diagnostic consideration from an imaging standpoint. Multiple nodular opacities are noted bilaterally, some of which are best described as  semi-solid. Largest of these nodular opacities measures approximately 1.5 cm. There is considerably less airspace opacity compared to the previous study, and the nodular opacities which are currently present may represent residua from the prior more diffuse airspace consolidation noted 5 years prior. Given the current appearance, non-contrast chest CT at 3-6 months is recommended. If nodules persist, subsequent management will be based upon the most suspicious nodule(s). This recommendation follows the consensus statement: Guidelines for Management of Incidental Pulmonary Nodules Detected on CT Images: From the Fleischner Society 2017; Radiology 2017; 284:228-243. If patient has clinical symptoms concerning for ongoing parenchymal lung disease, Pulmonary Medicine consultation with consideration for bronchoscopy may be reasonable. There are pleural effusions bilaterally, larger on the right than on the left. No pulmonary edema. Mild adenopathy of uncertain etiology. Small hiatal hernia. Reflux of contrast into the inferior vena cava and hepatic veins may be indicative of increased right heart pressure. Scattered areas of atherosclerotic calcification including foci of coronary artery calcification noted. Electronically Signed   By: Bretta Bang III M.D.   On: 06/10/2016 10:23    Procedures Procedures (including critical care time)  Medications Ordered in ED Medications  iopamidol (ISOVUE-370) 76 % injection (not administered)  carvedilol (COREG) tablet 12.5 mg (not administered)  atorvastatin (LIPITOR) tablet 80 mg (not administered)  clopidogrel (PLAVIX) tablet 75 mg (not administered)  aspirin chewable tablet 81 mg (not administered)  sodium chloride flush (NS) 0.9 % injection 3 mL (not administered)  sodium chloride flush (NS) 0.9 % injection 3 mL (not administered)  0.9 %  sodium chloride infusion (not administered)  acetaminophen (TYLENOL) tablet 650 mg (not administered)  ondansetron  (ZOFRAN) injection 4 mg (not administered)  enoxaparin (LOVENOX) injection 30 mg (not administered)  furosemide (LASIX) injection 40 mg (not administered)  insulin aspart protamine- aspart (NOVOLOG MIX 70/30) injection 8 Units (not administered)  insulin aspart (novoLOG) injection 0-9 Units (0 Units Subcutaneous Hold 06/10/16 1153)  iopamidol (ISOVUE-370) 76 % injection 100 mL (100 mLs Intravenous Contrast Given 06/10/16 1003)  insulin aspart protamine- aspart (NOVOLOG MIX 70/30) injection 8 Units (8 Units Subcutaneous Given 06/10/16 1123)  furosemide (LASIX) tablet 40 mg (40 mg Oral Given 06/10/16 1118)  cloNIDine (CATAPRES) tablet 0.1 mg (0.1 mg Oral Given 06/10/16 1118)     Initial Impression / Assessment and  Plan / ED Course  I have reviewed the triage vital signs and the nursing notes.  Pertinent labs & imaging results that were available during my care of the patient were reviewed by me and considered in my medical decision making (see chart for details).     Patient is history and symptoms concerning for CHF exacerbation versus ACS versus pneumonia versus PE versus arrhythmia versus bronchitis. Troponin elevated at 0.38. CBG elevated at 331, consistent with no insulin use in past 2 months.  CXR showed no acute cardiopulmonary process and no evidence of edema. EKG showed nonspecific T wave abnormality and long QT but no evidence of ST elevation or other arrhythmia. CBC unremarkable. CMP showed normal renal function, hypoNa at 131, otherwise unremarkable. Elevated troponin could be due to strain from uncontrolled HTN, ischemia or PE. Patient experienced some palpitations earlier in week that could be due to a-fib but none at the moment.  Due to elevated HR and noncompliance with medication, btained CTA which was negative for PE and thoracic aortic aneurysm. Showed bilateral pleural effusions and pulmonary nodules. See report for further findings. Began patient on home meds including insulin,  Coreg, Lasix and Clonidine for improvement in BP and CBG. Patient will need to admitted for further evaluation and treatment. There are multiple uncontrolled medical issues. Spoke to Dr. Elisabeth Pigeon who will admit the patient to hospitalist service. Advised to consult cardiology as well. Dr. Eden Emms from cardiology states he will consult during inpatient and advised Lasix 40mg  IV.  Patient discussed with and seen by Dr. Denton Lank.   Final Clinical Impressions(s) / ED Diagnoses   Final diagnoses:  None    New Prescriptions New Prescriptions   No medications on file     Dietrich Pates, PA-C 06/10/16 1253    Cathren Laine, MD 06/10/16 1326

## 2016-06-10 NOTE — Consult Note (Signed)
CARDIOLOGY CONSULT NOTE     Primary Care Physician: Almon Hercules, MD Referring Physician:  Dr Elisabeth Pigeon Primary Cardiologist:  Dr Jens Som  Admit Date: 06/10/2016  Reason for consultation:  SOB/ CHF  [name] is a 51 y.o. female with a h/o nonischemic CM, chronic systolic dysfunction, DM and HTN who is admitted with SOB.  Cardiology is consulted by Dr Elisabeth Pigeon for CHF management. The patient has been previously diagnosed with nonischemic CM.  She has been followed previously by Dr Jens Som but does not seem to keep regular appointments.  She reports that she has not taken any of her CHF medicines in the past 2 months. Over the past 4 weeks, she has noticed increasing SOB with activity and at rest.  She has also notice orthopnea and edema.  She does not have exertional CP but has some discomfort in her chest with cough.  She has been using her inhaler without relief.  She now presents for further evaluation.  Today, she denies symptoms of palpitations,  dizziness, presyncope, syncope, or neurologic sequela. The patient is tolerating medications without difficulties and is otherwise without complaint today.   Past Medical History:  Diagnosis Date  . Diabetes mellitus, type 2 (HCC)    A1C 7.6 April '13  . Hypertension   . Left leg pain 07/21/2015  . Nonischemic cardiomyopathy (HCC)    Echo 05/19/11 EF 35% w/ mild-mod MR; R/L Heart Cath 05/21/11 - mildly elevated right heart pressures, normal left heart pressures, preserved cardiac output, widely patent coronary arteries without significant CAD and moderate global systolic LV dysfunction, EF 35-40%.  . Systolic CHF (HCC)    Echo 05/19/11 EF 35% w/ mild-mod MR   Past Surgical History:  Procedure Laterality Date  . CERVIX LESION DESTRUCTION    . DILATION AND CURETTAGE OF UTERUS    . LEFT HEART CATHETERIZATION WITH CORONARY ANGIOGRAM N/A 05/21/2011   Procedure: LEFT HEART CATHETERIZATION WITH CORONARY ANGIOGRAM;  Surgeon: Tonny Bollman, MD;   Location: [facility] CATH LAB;  Service: Cardiovascular;  Laterality: N/A;  . TUBAL LIGATION  1996    . aspirin  81 mg Oral Daily  . atorvastatin  80 mg Oral q1800  . carvedilol  12.5 mg Oral BID WC  . clopidogrel  75 mg Oral Daily  . enoxaparin (LOVENOX) injection  50 mg Subcutaneous Q24H  . furosemide  40 mg Intravenous Daily  . insulin aspart  0-9 Units Subcutaneous TID WC  . insulin aspart protamine- aspart  8 Units Subcutaneous BID WC  . iopamidol      . sodium chloride flush  3 mL Intravenous Q12H   . sodium chloride      Allergies  Allergen Reactions  . Lisinopril Hives and Swelling    Facial   . Sertraline Hives and Swelling    Facial   . Tramadol Hives and Swelling    facial  . Ibuprofen Hives and Swelling    Social History   Social History  . Marital status: Single    Spouse name: N/A  . Number of children: 2  . Years of education: N/A   Occupational History  . Not on file.   Social History Main Topics  . Smoking status: Never Smoker  . Smokeless tobacco: Not on file  . Alcohol use No  . Drug use: No  . Sexual activity: Yes    Birth control/ protection: None   Other Topics Concern  . Not on file   Social History Narrative   Lives  in [place].  Presently unemployed but attends school full time    Family History  Problem Relation Age of Onset  . Diabetes      multiple  . Heart failure Paternal Grandmother   . Heart disease      multiple    ROS- All systems are reviewed and negative except as per the HPI above  Physical Exam: Telemetry: Vitals:   06/10/16 1030 06/10/16 1115 06/10/16 1238 06/10/16 1300  BP: (!) 138/97 (!) 139/93 (!) 138/109 120/77  Pulse: (!) 105  (!) 102 97  Resp: (!) 23 (!) 24 19 18   Temp:    97.4 F (36.3 C)  TempSrc:    Oral  SpO2: 100%  100% 99%  Weight:      Height:        GEN- The patient is overweight appearing, alert and oriented x 3 today.   Head- normocephalic, atraumatic Eyes-  Sclera clear, conjunctiva  pink Ears- hearing intact Oropharynx- clear Neck- supple, + JVD Lungs- decreased BS at bases, normal work of breathing Heart- tachycardic regular rhythm, no murmurs, rubs or gallops, PMI not laterally displaced GI- soft, NT, ND, + BS Extremities- no clubbing, cyanosis, + 1 pedal edema MS- no significant deformity or atrophy Skin- no rash or lesion Psych- euthymic mood, full affect Neuro- strength and sensation are intact  EKG tracing from today is reviewed and reveals sinus tachycardia, no ischemic changes, qt prolongation is noted  Labs:   Lab Results  Component Value Date   WBC 8.1 06/10/2016   HGB 12.8 06/10/2016   HCT 38.0 06/10/2016   MCV 86.2 06/10/2016   PLT 316 06/10/2016    Recent Labs Lab 06/10/16 0822  NA 131*  K 4.0  CL 100*  CO2 22  BUN 14  CREATININE 0.81  CALCIUM 8.8*  PROT 6.8  BILITOT 1.2  ALKPHOS 128*  ALT 38  AST 37  GLUCOSE 375*   Lab Results  Component Value Date   CKTOTAL 207 (H) 05/19/2011   CKMB 1.3 05/19/2011   TROPONINI <0.03 03/13/2015    Lab Results  Component Value Date   CHOL 187 05/05/2015   CHOL 258 (H) 02/24/2015   Lab Results  Component Value Date   HDL 29 (L) 05/05/2015   HDL 33 (L) 02/24/2015   Lab Results  Component Value Date   LDLCALC 121 05/05/2015   LDLCALC 192 (H) 02/24/2015   Lab Results  Component Value Date   TRIG 184 (H) 05/05/2015   TRIG 164 (H) 02/24/2015   Lab Results  Component Value Date   CHOLHDL 6.4 (H) 05/05/2015   CHOLHDL 7.8 02/24/2015   No results found for: LDLDIRECT    Radiology: CXR plain films from today are reviewed and reveal normal heart size without significant edema.  Report of Ct is also reviewed  Echo 02/25/15 is reviewed  ASSESSMENT AND PLAN:   1. Acute on chronic combined systolic and diastolic CHF/ nonischemic CM Exacerbated by medical nonadherence and dietary indiscretion Compliance is encouraged Agree with gentle diuresis as tolerated Has not previously  tolerated ace inhibitor due to facial swelling May benefit from hydralazine/ imdur if BP will allow after restarting coreg 2 gram sodium restriction Daily weights Strict Is and Os Echo pending  2. ? plavix I cannot see an indication for plavix.  As she has no CAD, prior stroke, or PVD, I would advise consideration be given to stopping plavix at this time.  3. Elevated troponin Likely demand ischemic from CHF  No further CV risk stratification is planned unless she has robust elevation of Tns or clinical status change  4. Hypertensive cardiovascular disease Restart coreg and follow closely Echo Salt restriction   Hillis Range, MD 06/10/2016  2:19 PM

## 2016-06-10 NOTE — Progress Notes (Addendum)
Inpatient Diabetes Program Recommendations  AACE/ADA: New Consensus Statement on Inpatient Glycemic Control (2015)  Target Ranges:  Prepandial:   less than 140 mg/dL      Peak postprandial:   less than 180 mg/dL (1-2 hours)      Critically ill patients:  140 - 180 mg/dL    Review of Glycemic Control  Diabetes history: DM 2 Outpatient Diabetes medications: Unable to take meds 70/30 17 units QAM, 8 units QPM, Metformin 500 mg BID Current orders for Inpatient glycemic control: 70/30 8 units BID, Novolog Sensitive tid  Inpatient Diabetes Program Recommendations:    Glucose 330's. No insurance will have to see if patient could afford [employer] insulin will inquire if patient knew to get insulin over the counter. Patient starting with 8 units of 70/30 BID. Will have to titrate morning insulin dose. Due to patient weight and glucose levels. Will see patient on 4/30. Watch trends on current dose.  Thanks,  Christena Deem RN, MSN, [facility] Inpatient Diabetes Coordinator Team Pager [phone] (8a-5p)

## 2016-06-10 NOTE — Progress Notes (Signed)
Report received from A. Moore,RN. No change in assessment. [name] 

## 2016-06-10 NOTE — H&P (Signed)
History and Physical    Jezabelle Chisolm ZOX:[id number] DOB: [birth year] DOA: 06/10/2016  Referring MD/NP/PA: Dr. Denton Lank  PCP: Almon Hercules, MD   Outpatient Specialists: Cardiology, Dr. Olga Millers   Patient coming from: home   Chief Complaint: shortness of breath   HPI: Irma Roulhac is a 51 y.o. female with medical history significant for chronic combined CHF (last 2 D ECHO in 02/2015 showed EF 35-40%), hypertension, dyslipidemia, DM, history of left MCA, not compliant with medications. She presented to ED with worsening shortness of breath at rest and with exertion, leg swelling and cough productive of clear sputum for past month or so but worse in past few days PTA. She ran out of her meds about 2 months ago. Patient also had associated right sided chest pain, non radiating especially worse with coughing. No fevers. No chills. No abdominal pain, nausea or vomiting. No diarrhea or constipation.    ED Course: In ED, BP was 156/108, HR 105-110, RR 17-26, normal oxygen saturation. Blood works showed troponin of 0.38, glucose of 375. BNP is pending. The 12 lead EKG showed sinus tachycardia. CXR showed no acute cardiopulmonary disease. CT angio chest showed no PE but it did show multiple nodular opacities bilaterally with CT scan follow up recommended in 3-6 months.    Review of Systems:  Constitutional: Negative for fever, chills, diaphoresis, activity change, appetite change and fatigue.  HENT: Negative for ear pain, nosebleeds, congestion, facial swelling, rhinorrhea, neck pain, neck stiffness and ear discharge.   Eyes: Negative for pain, discharge, redness, itching and visual disturbance.  Respiratory: per HPI  Cardiovascular: Negative for chest pain, palpitations and leg swelling.  Gastrointestinal: Negative for abdominal distention.  Genitourinary: Negative for dysuria, urgency, frequency, hematuria, flank pain, decreased urine volume, difficulty urinating and dyspareunia.    Musculoskeletal: Negative for back pain, joint swelling, arthralgias and gait problem.  Neurological: Negative for dizziness, tremors, seizures, syncope, facial asymmetry, speech difficulty, weakness, light-headedness, numbness and headaches. Occasional tingling in right hand (+) Hematological: Negative for adenopathy. Does not bruise/bleed easily.  Psychiatric/Behavioral: Negative for hallucinations, behavioral problems, confusion, dysphoric mood, decreased concentration and agitation.   Past Medical History:  Diagnosis Date  . Diabetes mellitus, type 2 (HCC)    A1C 7.6 April '13  . Hypertension   . Left leg pain 07/21/2015  . Nonischemic cardiomyopathy (HCC)    Echo 05/19/11 EF 35% w/ mild-mod MR; R/L Heart Cath 05/21/11 - mildly elevated right heart pressures, normal left heart pressures, preserved cardiac output, widely patent coronary arteries without significant CAD and moderate global systolic LV dysfunction, EF 35-40%.  . Systolic CHF (HCC)    Echo 05/19/11 EF 35% w/ mild-mod MR    Past Surgical History:  Procedure Laterality Date  . CERVIX LESION DESTRUCTION    . DILATION AND CURETTAGE OF UTERUS    . LEFT HEART CATHETERIZATION WITH CORONARY ANGIOGRAM N/A 05/21/2011   Procedure: LEFT HEART CATHETERIZATION WITH CORONARY ANGIOGRAM;  Surgeon: Tonny Bollman, MD;  Location: [facility] CATH LAB;  Service: Cardiovascular;  Laterality: N/A;  . TUBAL LIGATION  1996    Social history:  reports that she has never smoked. She does not have any smokeless tobacco history on file. She reports that she does not drink alcohol or use drugs.  Ambulation: ambulates without assistance at baseline   Allergies  Allergen Reactions  . Lisinopril Hives and Swelling    Facial   . Sertraline Hives and Swelling    Facial   . Tramadol Hives and Swelling  facial  . Ibuprofen Hives and Swelling    Family History  Problem Relation Age of Onset  . Diabetes      multiple  . Heart failure Paternal Grandmother    . Heart disease      multiple    Prior to Admission medications   Medication Sig Start Date End Date Taking? Authorizing Provider  albuterol (PROVENTIL HFA;VENTOLIN HFA) 108 (90 Base) MCG/ACT inhaler Inhale 1 puff into the lungs every 6 (six) hours as needed for wheezing or shortness of breath.   Yes Historical Provider, MD  aspirin 81 MG chewable tablet Chew 1 tablet (81 mg total) by mouth daily. Patient not taking: Reported on 06/10/2016 02/26/15   Almon Hercules, MD  atorvastatin (LIPITOR) 80 MG tablet Take 1 tablet (80 mg total) by mouth daily at 6 PM. Patient not taking: Reported on 06/10/2016 05/05/15   Almon Hercules, MD  carvedilol (COREG) 12.5 MG tablet Take 2 tablets (25 mg total) by mouth 2 (two) times daily with a meal. Patient not taking: Reported on 06/10/2016 05/05/15   Almon Hercules, MD  cloNIDine (CATAPRES) 0.1 MG tablet Take 1 tablet (0.1 mg total) by mouth daily. Patient not taking: Reported on 06/10/2016 07/30/15   Lorre Nick, MD  clopidogrel (PLAVIX) 75 MG tablet Take 1 tablet (75 mg total) by mouth daily. Patient not taking: Reported on 06/10/2016 05/05/15   Almon Hercules, MD  furosemide (LASIX) 40 MG tablet Take 1 tablet (40 mg total) by mouth 2 (two) times daily. Patient not taking: Reported on 06/10/2016 05/05/15   Almon Hercules, MD  insulin NPH-regular Human (NOVOLIN 70/30) (70-30) 100 UNIT/ML injection Inject 17 units in the AM, inject 8 units in PM 12 hours later 10 minutes before meals Patient not taking: Reported on 06/10/2016 05/05/15   Almon Hercules, MD  metFORMIN (GLUCOPHAGE) 1000 MG tablet Take 0.5 tablets (500 mg total) by mouth 2 (two) times daily with a meal. Reported on 02/23/2015 Patient not taking: Reported on 06/10/2016 05/05/15   Almon Hercules, MD  predniSONE (DELTASONE) 10 MG tablet Take 1 tablet (10 mg total) by mouth daily with breakfast. Patient not taking: Reported on 06/10/2016 08/02/15   Jamal Collin, MD    Physical Exam: Vitals:   06/10/16 0915 06/10/16 1023  06/10/16 1030 06/10/16 1115  BP: 97/84 (!) 137/93 (!) 138/97 (!) 139/93  Pulse: (!) 109 (!) 106 (!) 105   Resp: (!) 26 17 (!) 23 (!) 24  Temp:      TempSrc:      SpO2: 99% 99% 100%   Weight:      Height:        Constitutional: NAD, calm, comfortable Vitals:   06/10/16 0915 06/10/16 1023 06/10/16 1030 06/10/16 1115  BP: 97/84 (!) 137/93 (!) 138/97 (!) 139/93  Pulse: (!) 109 (!) 106 (!) 105   Resp: (!) 26 17 (!) 23 (!) 24  Temp:      TempSrc:      SpO2: 99% 99% 100%   Weight:      Height:       Eyes: PERRL, lids and conjunctivae normal ENMT: Mucous membranes are moist. Posterior pharynx clear of any exudate or lesions.Normal dentition.  Neck: normal, supple, no masses, no thyromegaly Respiratory: bibasilar rales, no wheezing  Cardiovascular: Rate controlled, appreciate S1-S2  Abdomen: no tenderness, no masses palpated. No hepatosplenomegaly. Bowel sounds positive.  Musculoskeletal: no clubbing / cyanosis. No joint deformity upper and lower extremities. Good  ROM, no contractures. Normal muscle tone. LE edema appreciated  Skin: no rashes, lesions, ulcers. No induration Neurologic: CN 2-12 grossly intact. Sensation intact, DTR normal. Strength 5/5 in all 4.  Psychiatric: Normal judgment and insight. Alert and oriented x 3. Normal mood.    Labs on Admission: I have personally reviewed following labs and imaging studies  CBC:  Recent Labs Lab 06/10/16 0822  WBC 8.1  NEUTROABS 4.6  HGB 12.8  HCT 38.0  MCV 86.2  PLT 316   Basic Metabolic Panel:  Recent Labs Lab 06/10/16 0822  NA 131*  K 4.0  CL 100*  CO2 22  GLUCOSE 375*  BUN 14  CREATININE 0.81  CALCIUM 8.8*   GFR: Estimated Creatinine Clearance: 107 mL/min (by C-G formula based on SCr of 0.81 mg/dL). Liver Function Tests:  Recent Labs Lab 06/10/16 0822  AST 37  ALT 38  ALKPHOS 128*  BILITOT 1.2  PROT 6.8  ALBUMIN 3.5   No results for input(s): LIPASE, AMYLASE in the last 168 hours. No results for  input(s): AMMONIA in the last 168 hours. Coagulation Profile: No results for input(s): INR, PROTIME in the last 168 hours. Cardiac Enzymes: No results for input(s): CKTOTAL, CKMB, CKMBINDEX, TROPONINI in the last 168 hours. BNP (last 3 results) No results for input(s): PROBNP in the last 8760 hours. HbA1C: No results for input(s): HGBA1C in the last 72 hours. CBG:  Recent Labs Lab 06/10/16 0819 06/10/16 1121  GLUCAP 331* 331*   Lipid Profile: No results for input(s): CHOL, HDL, LDLCALC, TRIG, CHOLHDL, LDLDIRECT in the last 72 hours. Thyroid Function Tests: No results for input(s): TSH, T4TOTAL, FREET4, T3FREE, THYROIDAB in the last 72 hours. Anemia Panel: No results for input(s): VITAMINB12, FOLATE, FERRITIN, TIBC, IRON, RETICCTPCT in the last 72 hours. Urine analysis:  Sepsis Labs: @LABRCNTIP (procalcitonin:4,lacticidven:4) )No results found for this or any previous visit (from the past 240 hour(s)).   Radiological Exams on Admission:  Dg Chest 2 View Result Date: 06/10/2016 No active cardiopulmonary disease.   Ct Angio Chest Pe W/cm &/or Wo Cm Result Date: 06/10/2016 No demonstrable pulmonary embolus. No thoracic aortic aneurysm. While no aortic dissection is evident, the contrast bolus is not sufficient in the aorta to exclude this entity is a potential diagnostic consideration from an imaging standpoint. Multiple nodular opacities are noted bilaterally, some of which are best described as semi-solid. Largest of these nodular opacities measures approximately 1.5 cm. There is considerably less airspace opacity compared to the previous study, and the nodular opacities which are currently present may represent residua from the prior more diffuse airspace consolidation noted 5 years prior. Given the current appearance, non-contrast chest CT at 3-6 months is recommended. If nodules persist, subsequent management will be based upon the most suspicious nodule(s). This recommendation  follows the consensus statement: Guidelines for Management of Incidental Pulmonary Nodules Detected on CT Images: From the Fleischner Society 2017; Radiology 2017; 284:228-243. If patient has clinical symptoms concerning for ongoing parenchymal lung disease, Pulmonary Medicine consultation with consideration for bronchoscopy may be reasonable. There are pleural effusions bilaterally, larger on the right than on the left. No pulmonary edema. Mild adenopathy of uncertain etiology. Small hiatal hernia. Reflux of contrast into the inferior vena cava and hepatic veins may be indicative of increased right heart pressure. Scattered areas of atherosclerotic calcification including foci of coronary artery calcification noted.   EKG: Independently reviewed. Sinus tachycardia.  Assessment/Plan  Principal Problem:   Acute on chronic combined systolic and diastolic CHF (congestive  heart failure) (HCC) /  Bilateral pleural effusion - CHF admission order set utilized - Cardio consulted  - ECHO in 01/2-17 showed EF of 35-40% - ECHO on this admission pending  - Started lasix 40 mg IV daily - Continue carvedilol 12.5 mg BID - Continue aspirin and plavix  - Continue daily weight and strict intake and output  Active Problems:   Troponin level elevated - Rule out ACS - The 12 lead EKG showed sinus tachycardia - Cycle cardiac enzymes  - Obtain 2 D ECHO - Continue aspirin and plavix - Cardio consulted    Benign essential HTN - Resume carvedilol, lasix    Diabetes mellitus, uncontrolled with peripheral circulatory disorder with long term insulin use  - Resume insulin regimen, will start Novolog 70/30 mix 8 units BID - Added SSI - Check A1c - A1c was 13.2 about 1 year ago     Dyslipidemia associated with type 2 diabetes mellitus (HCC) - Continue Lipitor     History of MCA CVA / Carotid artery disease - Continue aspirin and plavix    DVT prophylaxis: Lovenox subQ Code Status: full code Family  Communication: no family at the bedside  Disposition Plan: admission to telemetry  Consults called: Cardiology  Admission status: Inpatient, admission fro management of acute CHF exacerbation, needs IV lasix and cardiology consultation. ECHO was ordered and we also need to cycle cardiac enzymes. She may require stress test and/or cardiac cath depending on cardiac evaluation.   Manson Passey MD [facility] Pager [phone]  If 7PM-7AM, please contact night-coverage [email or web address] Password [facility]  06/10/2016, 11:35 AM

## 2016-06-10 NOTE — ED Notes (Signed)
Abnormal lab result MD Steinl have been made aware

## 2016-06-10 NOTE — ED Triage Notes (Signed)
Pt reports having increasing shortness of breath and cough along with swelling in lower extremities. Pt reports that she has been out of her medications for a week now.

## 2016-06-11 ENCOUNTER — Inpatient Hospital Stay (HOSPITAL_COMMUNITY): Payer: Medicaid Other

## 2016-06-11 ENCOUNTER — Telehealth: Payer: Self-pay

## 2016-06-11 ENCOUNTER — Encounter (HOSPITAL_COMMUNITY): Payer: Self-pay | Admitting: Physician Assistant

## 2016-06-11 DIAGNOSIS — I509 Heart failure, unspecified: Secondary | ICD-10-CM

## 2016-06-11 DIAGNOSIS — I420 Dilated cardiomyopathy: Secondary | ICD-10-CM

## 2016-06-11 LAB — ECHOCARDIOGRAM COMPLETE
Height: 69 in
Weight: 3284.8 oz

## 2016-06-11 LAB — BASIC METABOLIC PANEL
ANION GAP: 10 (ref 5–15)
BUN: 16 mg/dL (ref 6–20)
CHLORIDE: 101 mmol/L (ref 101–111)
CO2: 25 mmol/L (ref 22–32)
Calcium: 8.7 mg/dL — ABNORMAL LOW (ref 8.9–10.3)
Creatinine, Ser: 0.74 mg/dL (ref 0.44–1.00)
Glucose, Bld: 173 mg/dL — ABNORMAL HIGH (ref 65–99)
POTASSIUM: 3.9 mmol/L (ref 3.5–5.1)
SODIUM: 136 mmol/L (ref 135–145)

## 2016-06-11 LAB — HEMOGLOBIN A1C
Hgb A1c MFr Bld: 12.3 % — ABNORMAL HIGH (ref 4.8–5.6)
Mean Plasma Glucose: 306 mg/dL

## 2016-06-11 LAB — GLUCOSE, CAPILLARY
GLUCOSE-CAPILLARY: 235 mg/dL — AB (ref 65–99)
GLUCOSE-CAPILLARY: 196 mg/dL — AB (ref 65–99)
Glucose-Capillary: 122 mg/dL — ABNORMAL HIGH (ref 65–99)

## 2016-06-11 MED ORDER — POTASSIUM CHLORIDE CRYS ER 10 MEQ PO TBCR
10.0000 meq | EXTENDED_RELEASE_TABLET | Freq: Two times a day (BID) | ORAL | Status: DC
  Administered 2016-06-11 (×2): 10 meq via ORAL
  Filled 2016-06-11 (×2): qty 1

## 2016-06-11 MED ORDER — IOPAMIDOL (ISOVUE-370) INJECTION 76%
INTRAVENOUS | Status: AC
  Administered 2016-06-11: 100 mL via INTRAVENOUS
  Filled 2016-06-11: qty 100

## 2016-06-11 MED ORDER — ISOSORBIDE MONONITRATE ER 30 MG PO TB24
15.0000 mg | ORAL_TABLET | Freq: Every day | ORAL | Status: DC
  Administered 2016-06-11 – 2016-06-12 (×2): 15 mg via ORAL
  Filled 2016-06-11 (×2): qty 1

## 2016-06-11 MED ORDER — CARVEDILOL 6.25 MG PO TABS
6.2500 mg | ORAL_TABLET | Freq: Two times a day (BID) | ORAL | Status: DC
  Administered 2016-06-11 – 2016-06-12 (×2): 6.25 mg via ORAL
  Filled 2016-06-11 (×2): qty 1

## 2016-06-11 MED ORDER — LIVING WELL WITH DIABETES BOOK
Freq: Once | Status: AC
  Administered 2016-06-11: 15:00:00
  Filled 2016-06-11: qty 1

## 2016-06-11 MED ORDER — FUROSEMIDE 40 MG PO TABS
40.0000 mg | ORAL_TABLET | Freq: Two times a day (BID) | ORAL | Status: DC
  Administered 2016-06-11 – 2016-06-12 (×2): 40 mg via ORAL
  Filled 2016-06-11 (×2): qty 1

## 2016-06-11 NOTE — Progress Notes (Signed)
CSW acknowledged consult for "anticipated discharge to skilled facility". Patient screened by PT, no needs were identified. CSW signing off, please consult if new needs arise.   Celso Sickle, [place] Clinical Social Worker [facility] Cell#: [phone]

## 2016-06-11 NOTE — Progress Notes (Signed)
Inpatient Diabetes Program Recommendations  AACE/ADA: New Consensus Statement on Inpatient Glycemic Control (2015)  Target Ranges:  Prepandial:   less than 140 mg/dL      Peak postprandial:   less than 180 mg/dL (1-2 hours)      Critically ill patients:  140 - 180 mg/dL   Review of Glycemic Control  Diabetes history: DM 2 Outpatient Diabetes medications: 70/30 17 units QAM, 8 units QPM, Metformin 500 mg BID Current orders for Inpatient glycemic control: 70/30 8 units BID, Novolog Sensitive tid   Patient moved in with her brother and sister in law after her stroke January 2017. Patient reports that they have been "healthy" and do not make a lot of modifications for her as far as food choices go. Sister in law buys the food and cooks. Patient reports not having much of an appetite at times. Spoke with patient about Glucerna supplements and healthy snack and food choices. Informed patient that healthy food choice option paperwork will be attached to d/c paperwork for her sister in law to see.  Patient informs me of just recently learning of the [employer] insulin and glucose meters. Patient reports if she can't get into the clinic that this is an option for her. Patient spoke about her conversation with the CM about the orange card and being able to reapply for it.  Patient reports loving to walk and walks to the local park frequently. I encouraged physical activity and healthy food choices. Patient is appreciative of the one on one time spent with her.  Thanks,  Christena Deem RN, MSN, [facility] Inpatient Diabetes Coordinator Team Pager [phone] (8a-5p)

## 2016-06-11 NOTE — Progress Notes (Signed)
PT Cancellation Note  Patient Details Name: [name] MRN: [id number] DOB: 03-11-1965   Cancelled Treatment:    Reason Eval/Treat Not Completed: PT screened, no needs identified, will sign off   Rebeca Alert, MPT Pager: [phone]

## 2016-06-11 NOTE — CV Procedure (Signed)
2D echo attempted, but patient went for testing

## 2016-06-11 NOTE — Telephone Encounter (Signed)
Request from Geni Bers, RN CM for a hospital follow up appointment at [facility] for the patient. The patient does not have PCP.  Appointment scheduled for 06/18/16@ 1045 and the information was placed on the AVS.   Update provided to M. Wayne Both, RN CM

## 2016-06-11 NOTE — Progress Notes (Signed)
Progress Note  Patient Name: [name] Date of Encounter: 06/11/2016  Primary Cardiologist: Dr. Jens Som  Subjective   Patient is feeling well; denies chest pain, SOB, and palpitations. She continues to sleep with HOB elevated, which is her baseline.  Inpatient Medications    Scheduled Meds: . aspirin  81 mg Oral Daily  . atorvastatin  80 mg Oral q1800  . carvedilol  12.5 mg Oral BID WC  . clopidogrel  75 mg Oral Daily  . enoxaparin (LOVENOX) injection  50 mg Subcutaneous Q24H  . furosemide  40 mg Intravenous Daily  . insulin aspart  0-9 Units Subcutaneous TID WC  . insulin aspart protamine- aspart  8 Units Subcutaneous BID WC  . pneumococcal 23 valent vaccine  0.5 mL Intramuscular Tomorrow-1000  . sodium chloride flush  3 mL Intravenous Q12H   Continuous Infusions: . sodium chloride     PRN Meds: sodium chloride, acetaminophen, ondansetron (ZOFRAN) IV, sodium chloride flush   Vital Signs    Vitals:   06/10/16 1238 06/10/16 1300 06/10/16 2058 06/11/16 0606  BP: (!) 138/109 120/77 119/82 125/74  Pulse: (!) 102 97 91 86  Resp: 19 18 18 20   Temp:  97.4 F (36.3 C) 97.9 F (36.6 C) 97.8 F (36.6 C)  TempSrc:  Oral Oral Oral  SpO2: 100% 99% 98% 99%  Weight:    205 lb 4.8 oz (93.1 kg)  Height:        Intake/Output Summary (Last 24 hours) at 06/11/16 0951 Last data filed at 06/11/16 0753  Gross per 24 hour  Intake              480 ml  Output                7 ml  Net              473 ml   Filed Weights   06/10/16 0706 06/11/16 0606  Weight: 221 lb (100.2 kg) 205 lb 4.8 oz (93.1 kg)     Physical Exam   General: Well developed, well nourished, female appearing in no acute distress. Head: Normocephalic, atraumatic.  Neck: Supple without bruits, no JVD Lungs:  Resp regular and unlabored, CTA. Heart: RR, tachycardic rate, S1, S2, no S4, or murmur; no rub. Abdomen: Soft, non-tender, non-distended with normoactive bowel sounds. No hepatomegaly. No  rebound/guarding. No obvious abdominal masses. Extremities: No clubbing, cyanosis, Trace edema. Distal pedal pulses are 1+ bilaterally. Neuro: Alert and oriented X 3. Moves all extremities spontaneously. Psych: Normal affect.  Labs    Chemistry Recent Labs Lab 06/10/16 0822 06/11/16 0556  NA 131* 136  K 4.0 3.9  CL 100* 101  CO2 22 25  GLUCOSE 375* 173*  BUN 14 16  CREATININE 0.81 0.74  CALCIUM 8.8* 8.7*  PROT 6.8  --   ALBUMIN 3.5  --   AST 37  --   ALT 38  --   ALKPHOS 128*  --   BILITOT 1.2  --   GFRNONAA >60 >60  GFRAA >60 >60  ANIONGAP 9 10     Hematology Recent Labs Lab 06/10/16 0822  WBC 8.1  RBC 4.41  HGB 12.8  HCT 38.0  MCV 86.2  MCH 29.0  MCHC 33.7  RDW 13.8  PLT 316    Cardiac EnzymesNo results for input(s): TROPONINI in the last 168 hours.  Recent Labs Lab 06/10/16 0828  TROPIPOC 0.38*     BNP Recent Labs Lab 06/10/16 0822  BNP 534.8*  DDimer No results for input(s): DDIMER in the last 168 hours.   Radiology    Dg Chest 2 View  Result Date: 06/10/2016 CLINICAL DATA:  Cough and shortness of breath. Diabetes and hypertension. EXAM: CHEST  2 VIEW COMPARISON:  03/13/2015 FINDINGS: The heart size and mediastinal contours are within normal limits. Both lungs are clear. The visualized skeletal structures are unremarkable. IMPRESSION: No active cardiopulmonary disease. Electronically Signed   By: Myles Rosenthal M.D.   On: 06/10/2016 08:21   Ct Angio Chest Pe W/cm &/or Wo Cm  Result Date: 06/10/2016 CLINICAL DATA:  Shortness of breath and chest pain EXAM: CT ANGIOGRAPHY CHEST WITH CONTRAST TECHNIQUE: Multidetector CT imaging of the chest was performed using the standard protocol during bolus administration of intravenous contrast. Multiplanar CT image reconstructions and MIPs were obtained to evaluate the vascular anatomy. CONTRAST:  100 mL Isovue 370 nonionic COMPARISON:  Chest radiograph June 10, 2016 and chest CT May 18, 2011 FINDINGS:  Cardiovascular: There is no demonstrable pulmonary embolus. There is no thoracic aortic aneurysm. No dissection is seen; the contrast bolus is was timed to optimize pulmonary arterial vascular visualization and does not sufficiently opacified the thoracic aorta to confidently exclude dissection as a differential consideration based on imaging appearance. Visualized great vessels appear unremarkable. There are scattered foci of atherosclerotic calcification in the aorta. Pericardium is not appreciably thickened. There are scattered foci of coronary artery calcification. Mediastinum/Nodes: Thyroid appears unremarkable. There are several lymph nodes adjacent to the aortic arch on the left which are present. One of these lymph nodes is borderline prominent measuring 1.2 x 1.0 cm. Other lymph nodes in this area are subcentimeter. There is a sub- carinal lymph node measuring 1.8 x 1.3 cm. There is a small amount of air in the distal esophagus with a small hiatal hernia. Lungs/Pleura: There are scattered nodular appearing opacities bilaterally, significantly less pronounced than on prior study from 2013. Several of these nodular opacities have a somewhat semi-solid appearance. Nodular opacities range in size from as small as 3 mm to as large as 1.5 x 1 cm. These nodular lesions are seen throughout the lungs bilaterally. There are pleural effusions bilaterally, larger on the right than on the left. There is no new opacity in the lungs compared to the previous study. Upper Abdomen: There is reflux of contrast into the inferior vena cava and hepatic veins. Visualized upper abdominal structures otherwise appear unremarkable except for aortic atherosclerosis. Musculoskeletal: There are no blastic or lytic bone lesions. Review of the MIP images confirms the above findings. IMPRESSION: No demonstrable pulmonary embolus. No thoracic aortic aneurysm. While no aortic dissection is evident, the contrast bolus is not sufficient in the  aorta to exclude this entity is a potential diagnostic consideration from an imaging standpoint. Multiple nodular opacities are noted bilaterally, some of which are best described as semi-solid. Largest of these nodular opacities measures approximately 1.5 cm. There is considerably less airspace opacity compared to the previous study, and the nodular opacities which are currently present may represent residua from the prior more diffuse airspace consolidation noted 5 years prior. Given the current appearance, non-contrast chest CT at 3-6 months is recommended. If nodules persist, subsequent management will be based upon the most suspicious nodule(s). This recommendation follows the consensus statement: Guidelines for Management of Incidental Pulmonary Nodules Detected on CT Images: From the Fleischner Society 2017; Radiology 2017; 284:228-243. If patient has clinical symptoms concerning for ongoing parenchymal lung disease, Pulmonary Medicine consultation with consideration for bronchoscopy  may be reasonable. There are pleural effusions bilaterally, larger on the right than on the left. No pulmonary edema. Mild adenopathy of uncertain etiology. Small hiatal hernia. Reflux of contrast into the inferior vena cava and hepatic veins may be indicative of increased right heart pressure. Scattered areas of atherosclerotic calcification including foci of coronary artery calcification noted. Electronically Signed   By: Bretta Bang III M.D.   On: 06/10/2016 10:23     Telemetry    Sinus rhythm with bout of ST in the 100s - Personally Reviewed  ECG    EKG 06/10/16: NSR - Personally Reviewed  Cardiac Studies   Echocardiogram 06/11/16:  Pending  Echocardiogram 02/25/15: Study Conclusions - Left ventricle: The cavity size was moderately dilated. Wall   thickness was normal. Systolic function was moderately reduced.   The estimated ejection fraction was in the range of 35% to 40%.   Abnormal GLPSS at -11%,  anteroseptal and anterior regionality.   The study is not technically sufficient to allow evaluation of LV   diastolic function. - Mitral valve: Mildly thickened leaflets . There was mild   regurgitation. - Left atrium: The atrium was normal in size. - Pulmonary arteries: Dilated. - Inferior vena cava: The vessel was normal in size. The   respirophasic diameter changes were in the normal range (>= 50%),   consistent with normal central venous pressure.  Impressions: - Compared to a prior echo in 2014, the EF is lower at 35-40%.   GLPSS is abnormal at -11%, with anterior and anteroseptal   regionality.  Patient Profile     51 y.o. female with a h/o nonischemic CM, chronic systolic dysfunction, DM and HTN who is admitted with SOB.  Cardiology is consulted by Dr Elisabeth Pigeon for CHF management. She has not taken any of her CHF medications in the past 2 months.  Assessment & Plan    1. Acute on chronic systolic and diastolic heart failure, NICM - exacerbation secondary to medical noncompliance and dietary indescretion - overall net positive 400cc; however, she is not extremely volume overloaded on exam - need strict I&Os - weight on admission was 221 lbs, today is 205 lbs - difficult to determine dry weight as she has not been followed in our clinic since 2014; she was 219 lbs on 08/01/15 at PCP office visit - no ACEI for history of facial swelling - restarted coreg 12.5mg  BID yesterday - decreased coreg to 6.25 mg BID to allow pressure to tolerate imdur and hydralazine - added 15 mg of imdur to start this afternoon with lower dose of coreg; add hydralazine tomorrow as pressure allows - she is off supplemental O2 but can't sleep supine - which is not new for her. Volume status much improved, transition lasix to PO dose at her previous home dose and monitor status. - sCr stable. Added low dose of Kdur for replacement - echo pending   2. Plavix Pt should continue plavix per neuro for history  of stroke in Jan 2017, right-sided weakness now resolved.   3. HTN - continue coreg - sBP running 110-120s   4. DM - per primary team    Signed, Marcelino Duster , PA-C 9:51 AM 06/11/2016 Pager: [phone]

## 2016-06-11 NOTE — Progress Notes (Signed)
PROGRESS NOTE    [name]  QHK:[id number] DOB: Jun 13, 1965 DOA: 06/10/2016 PCP: Almon Hercules, MD     Brief Narrative:  [name] is a 51 y.o. female with medical history significant for chronic combined CHF (last 2 D ECHO in 02/2015 showed EF 35-40%), hypertension, dyslipidemia, DM, history of left MCA, not compliant with medications. She presented to ED with worsening shortness of breath at rest and with exertion, leg swelling and cough productive of clear sputum for past month or so but worse in past few days PTA. She ran out of her meds about 2 months ago.  Assessment & Plan:   Principal Problem:   Acute on chronic combined systolic and diastolic CHF (congestive heart failure) (HCC) Active Problems:   Benign essential HTN   Dyslipidemia associated with type 2 diabetes mellitus (HCC)   Bilateral pleural effusion   Troponin level elevated   DCM (dilated cardiomyopathy) (HCC)   Acute on chronic combined systolic and diastolic heart failure, nonischemic CM  -Exacerbated in setting of medical noncompliance -Appreciate cardiology -Diuresing well with IV Lasix, transition to oral today -Coreg, Imdur, hydralazine per cardiology -Repeat echo pending   Elevated troponin -Demand ischemia in setting of heart failure   Hx CVA 2017 -Was recommended dual antiplatelet for 6 months, then aspirin indefinitely pending repeat CTA neck -Repeat CTA neck today, pending results, will ask neurology and/or vascular surgery for consult/clarification regarding outpatient follow up and antiplatelet plan   Hx left common carotid artery dissection -Found in January 2017, vascular surgery consulted at that time, they recommended dual antiplatelet for 6 months, aspirin indefinitely, follow up CTA neck -Repeat CTA neck today  HTN -Coreg, Imdur, hydralazine per cardiology  DM type 2, uncontrolled, hyperglycemia, with complication in long-term insulin use -Ha1c 12.3  -Home insulin  regimen -SSI  -DM coordinator consulted   HLD -Continue Lipitor   Lung nodule -Need follow up non-contrast chest CT in 3-6 months   Medical noncompliance -States she ran out of medicine, cannot afford medicine, does not know who her doctors are or how to follow up or who to call. Case management assisting    DVT prophylaxis: lovenox Code Status: full Family Communication: no family at bedside Disposition Plan: pending improvement   Consultants:   Cardiology  Procedures:   none  Antimicrobials:   none    Subjective: No complaints today. She states that she feels much better since being admitted. Denies any shortness of breath or chest pain today. States that her leg swelling has improved.   Objective: Vitals:   06/10/16 1238 06/10/16 1300 06/10/16 2058 06/11/16 0606  BP: (!) 138/109 120/77 119/82 125/74  Pulse: (!) 102 97 91 86  Resp: 19 18 18 20   Temp:  97.4 F (36.3 C) 97.9 F (36.6 C) 97.8 F (36.6 C)  TempSrc:  Oral Oral Oral  SpO2: 100% 99% 98% 99%  Weight:    93.1 kg (205 lb 4.8 oz)  Height:        Intake/Output Summary (Last 24 hours) at 06/11/16 1147 Last data filed at 06/11/16 0753  Gross per 24 hour  Intake              480 ml  Output                7 ml  Net              473 ml   Filed Weights   06/10/16 0706 06/11/16 0606  Weight: 100.2 kg (  221 lb) 93.1 kg (205 lb 4.8 oz)    Examination:  General exam: Appears calm and comfortable  Respiratory system: Clear to auscultation. Respiratory effort normal. Cardiovascular system: S1 & S2 heard, RRR. No JVD, murmurs, rubs, gallops or clicks. Trace pedal edema. Gastrointestinal system: Abdomen is nondistended, soft and nontender. No organomegaly or masses felt. Normal bowel sounds heard. Central nervous system: Alert and oriented. No focal neurological deficits. Extremities: Symmetric  Skin: No rashes, lesions or ulcers Psychiatry: Judgement and insight appear normal. Mood & affect  appropriate.   Data Reviewed: I have personally reviewed following labs and imaging studies  CBC:  Recent Labs Lab 06/10/16 0822  WBC 8.1  NEUTROABS 4.6  HGB 12.8  HCT 38.0  MCV 86.2  PLT 316   Basic Metabolic Panel:  Recent Labs Lab 06/10/16 0820 06/10/16 0822 06/11/16 0556  NA  --  131* 136  K  --  4.0 3.9  CL  --  100* 101  CO2  --  22 25  GLUCOSE  --  375* 173*  BUN  --  14 16  CREATININE  --  0.81 0.74  CALCIUM  --  8.8* 8.7*  MG 1.6*  --   --    GFR: Estimated Creatinine Clearance: 104.5 mL/min (by C-G formula based on SCr of 0.74 mg/dL). Liver Function Tests:  Recent Labs Lab 06/10/16 0822  AST 37  ALT 38  ALKPHOS 128*  BILITOT 1.2  PROT 6.8  ALBUMIN 3.5   No results for input(s): LIPASE, AMYLASE in the last 168 hours. No results for input(s): AMMONIA in the last 168 hours. Coagulation Profile: No results for input(s): INR, PROTIME in the last 168 hours. Cardiac Enzymes: No results for input(s): CKTOTAL, CKMB, CKMBINDEX, TROPONINI in the last 168 hours. BNP (last 3 results) No results for input(s): PROBNP in the last 8760 hours. HbA1C:  Recent Labs  06/10/16 1137  HGBA1C 12.3*   CBG:  Recent Labs Lab 06/10/16 0819 06/10/16 1121 06/10/16 1736 06/10/16 2102 06/11/16 0751  GLUCAP 331* 331* 226* 106* 235*   Lipid Profile: No results for input(s): CHOL, HDL, LDLCALC, TRIG, CHOLHDL, LDLDIRECT in the last 72 hours. Thyroid Function Tests:  Recent Labs  06/10/16 1259  TSH 1.895   Anemia Panel: No results for input(s): VITAMINB12, FOLATE, FERRITIN, TIBC, IRON, RETICCTPCT in the last 72 hours. Sepsis Labs: No results for input(s): PROCALCITON, LATICACIDVEN in the last 168 hours.  No results found for this or any previous visit (from the past 240 hour(s)).     Radiology Studies: Dg Chest 2 View  Result Date: 06/10/2016 CLINICAL DATA:  Cough and shortness of breath. Diabetes and hypertension. EXAM: CHEST  2 VIEW COMPARISON:   03/13/2015 FINDINGS: The heart size and mediastinal contours are within normal limits. Both lungs are clear. The visualized skeletal structures are unremarkable. IMPRESSION: No active cardiopulmonary disease. Electronically Signed   By: Myles Rosenthal M.D.   On: 06/10/2016 08:21   Ct Angio Chest Pe W/cm &/or Wo Cm  Result Date: 06/10/2016 CLINICAL DATA:  Shortness of breath and chest pain EXAM: CT ANGIOGRAPHY CHEST WITH CONTRAST TECHNIQUE: Multidetector CT imaging of the chest was performed using the standard protocol during bolus administration of intravenous contrast. Multiplanar CT image reconstructions and MIPs were obtained to evaluate the vascular anatomy. CONTRAST:  100 mL Isovue 370 nonionic COMPARISON:  Chest radiograph June 10, 2016 and chest CT May 18, 2011 FINDINGS: Cardiovascular: There is no demonstrable pulmonary embolus. There is no thoracic aortic  aneurysm. No dissection is seen; the contrast bolus is was timed to optimize pulmonary arterial vascular visualization and does not sufficiently opacified the thoracic aorta to confidently exclude dissection as a differential consideration based on imaging appearance. Visualized great vessels appear unremarkable. There are scattered foci of atherosclerotic calcification in the aorta. Pericardium is not appreciably thickened. There are scattered foci of coronary artery calcification. Mediastinum/Nodes: Thyroid appears unremarkable. There are several lymph nodes adjacent to the aortic arch on the left which are present. One of these lymph nodes is borderline prominent measuring 1.2 x 1.0 cm. Other lymph nodes in this area are subcentimeter. There is a sub- carinal lymph node measuring 1.8 x 1.3 cm. There is a small amount of air in the distal esophagus with a small hiatal hernia. Lungs/Pleura: There are scattered nodular appearing opacities bilaterally, significantly less pronounced than on prior study from 2013. Several of these nodular opacities have a  somewhat semi-solid appearance. Nodular opacities range in size from as small as 3 mm to as large as 1.5 x 1 cm. These nodular lesions are seen throughout the lungs bilaterally. There are pleural effusions bilaterally, larger on the right than on the left. There is no new opacity in the lungs compared to the previous study. Upper Abdomen: There is reflux of contrast into the inferior vena cava and hepatic veins. Visualized upper abdominal structures otherwise appear unremarkable except for aortic atherosclerosis. Musculoskeletal: There are no blastic or lytic bone lesions. Review of the MIP images confirms the above findings. IMPRESSION: No demonstrable pulmonary embolus. No thoracic aortic aneurysm. While no aortic dissection is evident, the contrast bolus is not sufficient in the aorta to exclude this entity is a potential diagnostic consideration from an imaging standpoint. Multiple nodular opacities are noted bilaterally, some of which are best described as semi-solid. Largest of these nodular opacities measures approximately 1.5 cm. There is considerably less airspace opacity compared to the previous study, and the nodular opacities which are currently present may represent residua from the prior more diffuse airspace consolidation noted 5 years prior. Given the current appearance, non-contrast chest CT at 3-6 months is recommended. If nodules persist, subsequent management will be based upon the most suspicious nodule(s). This recommendation follows the consensus statement: Guidelines for Management of Incidental Pulmonary Nodules Detected on CT Images: From the Fleischner Society 2017; Radiology 2017; 284:228-243. If patient has clinical symptoms concerning for ongoing parenchymal lung disease, Pulmonary Medicine consultation with consideration for bronchoscopy may be reasonable. There are pleural effusions bilaterally, larger on the right than on the left. No pulmonary edema. Mild adenopathy of uncertain  etiology. Small hiatal hernia. Reflux of contrast into the inferior vena cava and hepatic veins may be indicative of increased right heart pressure. Scattered areas of atherosclerotic calcification including foci of coronary artery calcification noted. Electronically Signed   By: Bretta Bang III M.D.   On: 06/10/2016 10:23      Scheduled Meds: . aspirin  81 mg Oral Daily  . atorvastatin  80 mg Oral q1800  . carvedilol  6.25 mg Oral BID WC  . clopidogrel  75 mg Oral Daily  . enoxaparin (LOVENOX) injection  50 mg Subcutaneous Q24H  . furosemide  40 mg Oral BID  . insulin aspart  0-9 Units Subcutaneous TID WC  . insulin aspart protamine- aspart  8 Units Subcutaneous BID WC  . isosorbide mononitrate  15 mg Oral Daily  . living well with diabetes book   Does not apply Once  . pneumococcal 23  valent vaccine  0.5 mL Intramuscular Tomorrow-1000  . potassium chloride  10 mEq Oral BID  . sodium chloride flush  3 mL Intravenous Q12H   Continuous Infusions: . sodium chloride       LOS: 1 day    Time spent: 40 minutes   Noralee Stain, DO [facility] [email or web address] Password [facility] 06/11/2016, 11:47 AM

## 2016-06-11 NOTE — Progress Notes (Signed)
Spoke with pt concerning medications and PCP. Pt's follow appointment with [facility] 5/7 at 1045 AM. Pt states, "I Know where that is. Thank You." Pt is aware that she can purchased her medications there, make appartment for [facility] card renewal, and continue to follow up with them for PCP, medications and renew [facility] card.

## 2016-06-11 NOTE — Progress Notes (Signed)
  Echocardiogram 2D Echocardiogram has been performed.  Janalyn Harder 06/11/2016, 4:20 PM

## 2016-06-12 ENCOUNTER — Telehealth: Payer: Self-pay | Admitting: Cardiology

## 2016-06-12 ENCOUNTER — Telehealth: Payer: Self-pay

## 2016-06-12 LAB — GLUCOSE, CAPILLARY
GLUCOSE-CAPILLARY: 140 mg/dL — AB (ref 65–99)
Glucose-Capillary: 173 mg/dL — ABNORMAL HIGH (ref 65–99)
Glucose-Capillary: 249 mg/dL — ABNORMAL HIGH (ref 65–99)

## 2016-06-12 LAB — HIV ANTIBODY (ROUTINE TESTING W REFLEX): HIV Screen 4th Generation wRfx: NONREACTIVE

## 2016-06-12 LAB — BASIC METABOLIC PANEL
ANION GAP: 7 (ref 5–15)
BUN: 14 mg/dL (ref 6–20)
CALCIUM: 8.8 mg/dL — AB (ref 8.9–10.3)
CO2: 30 mmol/L (ref 22–32)
Chloride: 101 mmol/L (ref 101–111)
Creatinine, Ser: 0.91 mg/dL (ref 0.44–1.00)
GFR calc Af Amer: >60 mL/min (ref >60)
Glucose, Bld: 131 mg/dL — ABNORMAL HIGH (ref 65–99)
POTASSIUM: 3.4 mmol/L — AB (ref 3.5–5.1)
SODIUM: 138 mmol/L (ref 135–145)

## 2016-06-12 LAB — MAGNESIUM: MAGNESIUM: 1.6 mg/dL — AB (ref 1.7–2.4)

## 2016-06-12 LAB — TROPONIN I: Troponin I: 1.1 ng/mL (ref <0.03)

## 2016-06-12 MED ORDER — ATORVASTATIN CALCIUM 80 MG PO TABS: 80.0000 mg | ORAL_TABLET | Freq: Every day | ORAL | 0 refills | Status: DC

## 2016-06-12 MED ORDER — MAGNESIUM SULFATE 2 GM/50ML IV SOLN
2.0000 g | Freq: Once | INTRAVENOUS | Status: AC
  Administered 2016-06-12: 2 g via INTRAVENOUS
  Filled 2016-06-12: qty 50

## 2016-06-12 MED ORDER — BLOOD GLUCOSE METER KIT: PACK | 0 refills | Status: DC

## 2016-06-12 MED ORDER — ALBUTEROL SULFATE HFA 108 (90 BASE) MCG/ACT IN AERS
1.0000 | INHALATION_SPRAY | Freq: Four times a day (QID) | RESPIRATORY_TRACT | 0 refills | Status: DC | PRN
Start: 2016-06-12 — End: 2016-06-12

## 2016-06-12 MED ORDER — POTASSIUM CHLORIDE CRYS ER 10 MEQ PO TBCR: 10.0000 meq | EXTENDED_RELEASE_TABLET | Freq: Two times a day (BID) | ORAL | 0 refills | Status: DC

## 2016-06-12 MED ORDER — INSULIN NPH ISOPHANE & REGULAR (70-30) 100 UNIT/ML ~~LOC~~ SUSP: SUBCUTANEOUS | 3 refills | Status: DC

## 2016-06-12 MED ORDER — POTASSIUM CHLORIDE CRYS ER 20 MEQ PO TBCR: 40.0000 meq | EXTENDED_RELEASE_TABLET | Freq: Every day | ORAL | Status: DC

## 2016-06-12 MED ORDER — METFORMIN HCL 1000 MG PO TABS: 1000.0000 mg | ORAL_TABLET | Freq: Two times a day (BID) | ORAL | 0 refills | Status: DC

## 2016-06-12 MED ORDER — "INSULIN SYRINGE 31G X 5/16"" 0.3 ML MISC": 0 refills | Status: DC

## 2016-06-12 MED ORDER — CARVEDILOL 6.25 MG PO TABS: 6.2500 mg | ORAL_TABLET | Freq: Two times a day (BID) | ORAL | 0 refills | Status: DC

## 2016-06-12 MED ORDER — FUROSEMIDE 40 MG PO TABS: 40.0000 mg | ORAL_TABLET | Freq: Two times a day (BID) | ORAL | 0 refills | Status: DC

## 2016-06-12 MED ORDER — ISOSORBIDE MONONITRATE ER 30 MG PO TB24: 15.0000 mg | ORAL_TABLET | Freq: Every day | ORAL | 0 refills | Status: DC

## 2016-06-12 MED ORDER — ASPIRIN 81 MG PO CHEW: 81.0000 mg | CHEWABLE_TABLET | Freq: Every day | ORAL | 0 refills | Status: DC

## 2016-06-12 MED ORDER — HYDRALAZINE HCL 10 MG PO TABS: 10.0000 mg | ORAL_TABLET | Freq: Three times a day (TID) | ORAL | 0 refills | Status: DC

## 2016-06-12 MED ORDER — ENOXAPARIN SODIUM 40 MG/0.4ML ~~LOC~~ SOLN: 40.0000 mg | SUBCUTANEOUS | Status: DC

## 2016-06-12 MED ORDER — HYDRALAZINE HCL 10 MG PO TABS: 10.0000 mg | ORAL_TABLET | Freq: Three times a day (TID) | ORAL | Status: DC

## 2016-06-12 MED ORDER — POTASSIUM CHLORIDE CRYS ER 20 MEQ PO TBCR
40.0000 meq | EXTENDED_RELEASE_TABLET | Freq: Once | ORAL | Status: AC
  Administered 2016-06-12: 40 meq via ORAL
  Filled 2016-06-12: qty 2

## 2016-06-12 MED ORDER — ALBUTEROL SULFATE HFA 108 (90 BASE) MCG/ACT IN AERS: 1.0000 | INHALATION_SPRAY | Freq: Four times a day (QID) | RESPIRATORY_TRACT | 0 refills | Status: DC | PRN

## 2016-06-12 NOTE — Progress Notes (Addendum)
CRITICAL VALUE ALERT  Critical value received:  Troponin 1.10  Date of notification:  06/12/16  Time of notification:  1333  Critical value read back:Yes.    Nurse who received alert:  Corrie Dandy Zabella Wease  MD notified (1st page):  Dr. Alvino Chapel  Time of first page:  1335  MD notified (2nd page):  Time of second page:  Responding MD:  Dr. Alvino Chapel  Time MD responded:  1345   Addendum:  Order for discharge.  MD states she spoke with cardiology and ok to discharge with elevated troponin.  Pt will follow up.

## 2016-06-12 NOTE — Progress Notes (Addendum)
Progress Note  Patient Name: [name] Date of Encounter: 06/12/2016  Primary Cardiologist: Dr. Jens Som  Subjective   No complaints of SOB or CP today  Inpatient Medications    Scheduled Meds: . aspirin  81 mg Oral Daily  . atorvastatin  80 mg Oral q1800  . carvedilol  6.25 mg Oral BID WC  . enoxaparin (LOVENOX) injection  40 mg Subcutaneous Q24H  . furosemide  40 mg Oral BID  . hydrALAZINE  10 mg Oral TID  . insulin aspart  0-9 Units Subcutaneous TID WC  . insulin aspart protamine- aspart  8 Units Subcutaneous BID WC  . isosorbide mononitrate  15 mg Oral Daily  . pneumococcal 23 valent vaccine  0.5 mL Intramuscular Tomorrow-1000  . sodium chloride flush  3 mL Intravenous Q12H   Continuous Infusions: . sodium chloride    . magnesium sulfate 1 - 4 g bolus IVPB     PRN Meds: sodium chloride, acetaminophen, ondansetron (ZOFRAN) IV, sodium chloride flush   Vital Signs    Vitals:   06/11/16 0606 06/11/16 1437 06/11/16 2110 06/12/16 0449  BP: 125/74 111/77 124/72 127/84  Pulse: 86 84 84 88  Resp: 20 18 18 18   Temp: 97.8 F (36.6 C) 97.6 F (36.4 C) 98.1 F (36.7 C) 97.9 F (36.6 C)  TempSrc: Oral Oral Oral Oral  SpO2: 99% 100% 100% 100%  Weight: 205 lb 4.8 oz (93.1 kg)   201 lb 14.4 oz (91.6 kg)  Height:        Intake/Output Summary (Last 24 hours) at 06/12/16 1116 Last data filed at 06/12/16 0844  Gross per 24 hour  Intake              480 ml  Output                0 ml  Net              480 ml   Filed Weights   06/10/16 0706 06/11/16 0606 06/12/16 0449  Weight: 221 lb (100.2 kg) 205 lb 4.8 oz (93.1 kg) 201 lb 14.4 oz (91.6 kg)    Telemetry    NSR - Personally Reviewed  ECG    No new EKG to review   Physical Exam   GEN: WD, WN in NAD Neck: no JVD or bruit Cardiac: RRR with no M/R/G Respiratory: CTA throughout GI: soft, NT, ND with active BS MS: no edema Neuro:  A&O x 3 Psych: normal mood and affect  Labs    Chemistry Recent  Labs Lab 06/10/16 0822 06/11/16 0556 06/12/16 0401  NA 131* 136 138  K 4.0 3.9 3.4*  CL 100* 101 101  CO2 22 25 30   GLUCOSE 375* 173* 131*  BUN 14 16 14   CREATININE 0.81 0.74 0.91  CALCIUM 8.8* 8.7* 8.8*  PROT 6.8  --   --   ALBUMIN 3.5  --   --   AST 37  --   --   ALT 38  --   --   ALKPHOS 128*  --   --   BILITOT 1.2  --   --   GFRNONAA >60 >60 >60  GFRAA >60 >60 >60  ANIONGAP 9 10 7      Hematology Recent Labs Lab 06/10/16 0822  WBC 8.1  RBC 4.41  HGB 12.8  HCT 38.0  MCV 86.2  MCH 29.0  MCHC 33.7  RDW 13.8  PLT 316    Cardiac EnzymesNo results for input(s):  TROPONINI in the last 168 hours.  Recent Labs Lab 06/10/16 0828  TROPIPOC 0.38*     BNP Recent Labs Lab 06/10/16 0822  BNP 534.8*     DDimer No results for input(s): DDIMER in the last 168 hours.   Radiology    Ct Angio Neck W Or Wo Contrast  Result Date: 06/11/2016 CLINICAL DATA:  History of carotid dissection follow-up EXAM: CT ANGIOGRAPHY NECK TECHNIQUE: Multidetector CT imaging of the neck was performed using the standard protocol during bolus administration of intravenous contrast. Multiplanar CT image reconstructions and MIPs were obtained to evaluate the vascular anatomy. Carotid stenosis measurements (when applicable) are obtained utilizing NASCET criteria, using the distal internal carotid diameter as the denominator. CONTRAST:  100 mL Isovue 370 IV COMPARISON:  CTA neck 02/25/2015 FINDINGS: Aortic arch: Normal aortic arch.  Three vessel aortic arch. Right carotid system: Mild atherosclerotic disease in the carotid bulb on the right with small calcification. No significant stenosis. This plaque is mildly ulcerated as noted previously. Left carotid system: Interval healing of dissection in the left common carotid artery. Mild residual thickening of the medial wall of the carotid without significant stenosis. No dissection flap. Left carotid bifurcation widely patent without stenosis or  atherosclerotic disease Vertebral arteries: Negative for dissection or stenosis. Skeleton: Cervical kyphosis. Disc degeneration and spondylosis at C5-6. No acute skeletal abnormality. Other neck: Negative for adenopathy or mass in the neck. Upper chest: Lung apices clear. 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 No significant stenosis identified. Mild atherosclerotic calcification in the right carotid bulb with a small ulcerated plaque similar to the prior study. No significant stenosis. Electronically Signed   By: Marlan Palau M.D.   On: 06/11/2016 14:59    Cardiac Studies  2D echo 06/11/2016 Study Conclusions  - Left ventricle: The cavity size was severely dilated. Wall   thickness was normal. The estimated ejection fraction was 25%.   Diffuse hypokinesis. - Mitral valve: There was moderate regurgitation. - Left atrium: The atrium was mildly dilated. - Atrial septum: No defect or patent foramen ovale was identified.  Patient Profile     51 y.o. female with a h/o nonischemic CM, chronic systolic dysfunction, DM and HTN who is admitted with SOB. Cardiology is consulted by Dr Elisabeth Pigeon for CHF management. She has not taken any of her CHF medications in the past 2 months.  Assessment & Plan    1. Acute on chronic combined systolic and diastolic heart failure, NICM (Cath 05/2011 with normal coronary arteries) - exacerbation secondary to medical noncompliance and dietary indescretion - I&Os are incomplete with no output recorded. - weight on admission was 221 lbs, today is 201 lbs so down 10lbs. - difficult to determine dry weight as she has not been followed in our clinic since 2014; she was 219 lbs on 08/01/15 at PCP office visit - no ACEI for history of facial swelling - Continue coreg 6.25mg  BID and Imdur 15mg  daily.   - Added Hydralazine 10mg  TID for afterload reduction this am - Continue PO Lasix - sCr stable.  - echo showed EF 25%  (down from 30-40% 02/2015)  2. 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 -Repeat CTA neck today, pending results, will ask neurology and/or vascular surgery for consult/clarification regarding outpatient follow up  and antiplatelet plan   3. HTN - continue coreg - sBP running 110-120s  4. DM - per primary team  5.  Hypokalemia - replete per TRH  6.  Elevated trop - likely related to demand ischemia in the setting of acute CHF.  Second trop elevated at 1.1 (first trop was POC at 0.38).  Cath in 2013 with no CAD.  She has not had any anginal CP prior to admission or during this admission. No regional wall motion abnormalities on echo just diffuse HK.  Unlikely to be ACS.  Would not pursue further workup at this time. Could consider outpt nuclear stress test.  Please have her followup with Dr. Jens Som as outpt.  Patient really wants to go home.  She has been ambulating in the hall with no SOB.  She appears euvolemic on exam.  I think she is stable to go home with early followup in our office which we will arrange.  She has been instructed to weigh daily and call if her weight increases > 3lbs in 1 day or 5lbs in 1 week.  Will sign off.  Call with any questions.   Signed, Armanda Magic, MD  06/12/2016, 11:16 AM

## 2016-06-12 NOTE — Progress Notes (Signed)
Confirmed pt can purchase medications at [facility]. VM was left for pt [phone] to call this CM back. Medications can be picked up at 5 PM today.

## 2016-06-12 NOTE — Progress Notes (Signed)
HPI: FU CM/CHF. Echocardiogram in April of 2013 showed an ejection fraction of 35%, moderate left ventricular enlargement, mild to moderate mitral regurgitation. HIV negative and TSH normal. Cardiac catheterization in April of 2013 showed normal coronary arteries and an ejection fraction of 35-40%. Patient was treated with medications and her cardiomyopathy was felt possibly secondary to hypertension. Echocardiogram repeated in September of 2013. Her ejection fraction was 30-35%. Patient referred to Dr. Rayann Heman for consideration of ICD but the patient decided not to pursue. Last echo 4/18 showed EF 25, moderate MR; mild LAE. Admitted 4/18 with CHF due to noncompliance (ran out of meds). Troponin 1.1 felt demand ischemia. Also with h/o carotid dissection healed on CTA 4/18. Chest CT 4/18 showed lung nodules and fu recommended 3-6 months. Since last seen, patient denies dyspnea, chest pain, palpitations or syncope.  Current Outpatient Prescriptions  Medication Sig Dispense Refill  . albuterol (PROVENTIL HFA;VENTOLIN HFA) 108 (90 Base) MCG/ACT inhaler Inhale 1 puff into the lungs every 6 (six) hours as needed for wheezing or shortness of breath. 1 Inhaler 0  . aspirin 81 MG chewable tablet Chew 1 tablet (81 mg total) by mouth daily. 30 tablet 0  . atorvastatin (LIPITOR) 80 MG tablet Take 1 tablet (80 mg total) by mouth daily at 6 PM. 30 tablet 0  . blood glucose meter kit and supplies Dispense based on patient and insurance preference. Use up to four times daily as directed. (FOR ICD-9 250.00, 250.01). 1 each 0  . carvedilol (COREG) 6.25 MG tablet Take 1 tablet (6.25 mg total) by mouth 2 (two) times daily with a meal. 60 tablet 0  . furosemide (LASIX) 40 MG tablet Take 1 tablet (40 mg total) by mouth 2 (two) times daily. 60 tablet 0  . hydrALAZINE (APRESOLINE) 10 MG tablet Take 1 tablet (10 mg total) by mouth 3 (three) times daily. 90 tablet 0  . insulin NPH-regular Human (NOVOLIN 70/30) (70-30) 100  UNIT/ML injection Inject 17 units in the AM, inject 8 units in PM 12 hours later 10 minutes before meals 10 mL 3  . Insulin Syringe-Needle U-100 (INSULIN SYRINGE .3CC/31GX5/16") 31G X 5/16" 0.3 ML MISC Use with insulin, twice daily 100 each 0  . isosorbide mononitrate (IMDUR) 30 MG 24 hr tablet Take 0.5 tablets (15 mg total) by mouth daily. 30 tablet 0  . metFORMIN (GLUCOPHAGE) 1000 MG tablet Take 1 tablet (1,000 mg total) by mouth 2 (two) times daily with a meal. 60 tablet 0  . potassium chloride (K-DUR,KLOR-CON) 10 MEQ tablet Take 1 tablet (10 mEq total) by mouth 2 (two) times daily. 60 tablet 0   No current facility-administered medications for this visit.      Past Medical History:  Diagnosis Date  . Diabetes mellitus, type 2 (HCC)    A1C 7.6 April '13  . Hypertension   . Left leg pain 07/21/2015  . Nonischemic cardiomyopathy ([place])    Echo 05/19/11 EF 35% w/ mild-mod MR; R/L Heart Cath 05/21/11 - mildly elevated right heart pressures, normal left heart pressures, preserved cardiac output, widely patent coronary arteries without significant CAD and moderate global systolic LV dysfunction, EF 35-40%.  . Stroke (cerebrum) ([place]) 02/2015   right sided weakness, now resolved  . Systolic CHF ([place])    Echo 05/19/11 EF 35% w/ mild-mod MR    Past Surgical History:  Procedure Laterality Date  . CERVIX LESION DESTRUCTION    . DILATION AND CURETTAGE OF UTERUS    . LEFT  HEART CATHETERIZATION WITH CORONARY ANGIOGRAM N/A 05/21/2011   Procedure: LEFT HEART CATHETERIZATION WITH CORONARY ANGIOGRAM;  Surgeon: Sherren Mocha, MD;  Location: [facility] CATH LAB;  Service: Cardiovascular;  Laterality: N/A;  . TUBAL LIGATION  1996    Social History   Social History  . Marital status: Single    Spouse name: N/A  . Number of children: 2  . Years of education: N/A   Occupational History  . Not on file.   Social History Main Topics  . Smoking status: Never Smoker  . Smokeless tobacco: Never Used  . Alcohol use  No  . Drug use: No  . Sexual activity: Yes    Birth control/ protection: None   Other Topics Concern  . Not on file   Social History Narrative   Lives in [place].  Presently unemployed but attends school full time    Family History  Problem Relation Age of Onset  . Diabetes      multiple  . Heart failure Paternal Grandmother   . Heart disease      multiple    ROS: no fevers or chills, productive cough, hemoptysis, dysphasia, odynophagia, melena, hematochezia, dysuria, hematuria, rash, seizure activity, orthopnea, PND, pedal edema, claudication. Remaining systems are negative.  Physical Exam: Well-developed well-nourished in no acute distress.  Skin is warm and dry.  HEENT is normal.  Neck is supple.  Chest is clear to auscultation with normal expansion. No wheeze. Cardiovascular exam is regular rate and rhythm.  Abdominal exam nontender or distended. No masses palpated. Extremities show no edema. neuro grossly intact   A/P  1 cardiomyopathy-etiology unclear. Previous catheterization revealed no coronary disease. Patient with history of angioedema with ACE inhibitor. Continue beta blocker, hydralazine and nitrates. Repeat echocardiogram in 3 months now that medications reinitiated. If ejection fraction less than 35% will refer for consideration of ICD. She would now be agreeable.  2 chronic systolic congestive heart failure-continue present dose of Lasix. Add spironolactone 25 mg daily. Check potassium and renal function in 1 week. We discussed the importance of fluid restriction, sodium restriction. She should weigh daily and take an additional 40 mg of Lasix for weight gain of 2-3 pounds.  3 hypertension-blood pressure controlled. Continue present medications.  4 history of left common carotid artery dissection-healed on recent CT.  5 lung nodule-she will need follow-up CT with primary care in 3-6 months; this was discussed with pt.  6 noncompliance-patient  counseled on the importance of taking her medications.  7 recent elevated troponin-felt likely secondary to congestive heart failure. Previous catheterization revealed no coronary disease. Will not pursue further evaluation.  Kirk Ruths, MD

## 2016-06-12 NOTE — Telephone Encounter (Signed)
Call received from Geni Bers, RN CM inquiring if the patient can pick up her medications today at [facility]. As per Carlis Stable, [facility] Tech, the patient can pick up the medications at 1700 if the prescriptions are faxed to the pharmacy now.  That information was provided to Susanne Borders, RN CM

## 2016-06-12 NOTE — Telephone Encounter (Signed)
TCM Phone Call-Please Call Pt-Pt has an appointment with Dr Jens Som on 06-19-16.

## 2016-06-12 NOTE — Discharge Summary (Signed)
Physician Discharge Summary  [name] OBS:[id number] DOB: June 08, 1965 DOA: 06/10/2016  PCP: Mercy Riding, MD  Admit date: 06/10/2016 Discharge date: 06/12/2016  Admitted From: Home Disposition:  Home  Recommendations for Outpatient Follow-up:  1. Follow up with PCP in 1 week. Hospital follow up appointment scheduled for her on 5/7 2. Follow up with Cardiology on 5/8, consider outpatient nuc stress test  3. Please obtain BMP/Mg in 1 week  4. Need follow up non-contrast chest CT in 3-6 months regarding lung nodule   Home Health: No  Equipment/Devices: None   Discharge Condition: Stable CODE STATUS: Full  Diet recommendation: Heart healthy, carb modified   Brief/Interim Summary: Michelle Thompsonis a 51 y.o.femalewith medical history significant for chronic combined CHF (last 2 D ECHO in 02/2015 showed EF 35-40%), hypertension, dyslipidemia, DM, history of left MCA, not compliant with medications. She presented to ED with worsening shortness of breath at rest and with exertion, leg swelling and cough productive of clear sputum for past month or so but worse in past few days PTA. She ran out of her meds about 2 months ago. She was started on IV Lasix, cardiology consulted. She continued to diuresis well. Her medications were adjusted to include Coreg, Imdur, hydralazine, as well as oral Lasix. Echocardiogram was repeated with reduced EF of 25%. Medical compliance was encouraged, resources provided by case management.  Discharge Diagnoses:  Principal Problem:   Acute on chronic combined systolic and diastolic CHF (congestive heart failure) (HCC) Active Problems:   Benign essential HTN   Dyslipidemia associated with type 2 diabetes mellitus (HCC)   Bilateral pleural effusion   Troponin level elevated   DCM (dilated cardiomyopathy) (HCC)   Acute on chronic combined systolic and diastolic heart failure, nonischemic CM  -Exacerbated in setting of medical noncompliance -Appreciate  cardiology -Diuresing well with IV Lasix, transitioned to oral  -Coreg, Imdur, hydralazine per cardiology -Repeat echo as below, EF 25 %   Elevated troponin -Demand ischemia in setting of heart failure  -Denies chest pain. Troponin 1.1. Discussed with Dr. Radford Pax, unlikely to be ACS as patient has had neg cath in 2013 and also without any complaints of anginal chest pain or equivalent. She needs to follow up with cardiology outpatient.   Hx CVA 2017 -Was recommended dual antiplatelet for 6 months, then aspirin indefinitely pending repeat CTA neck -Aspirin daily   Hx left common carotid artery dissection -Found in January 2017, vascular surgery consulted at that time, they recommended dual antiplatelet for 6 months, aspirin indefinitely, follow up CTA neck -Repeat CTA neck with healing left common carotid artery dissection since prior study  -Aspirin daily   HTN -Coreg, Imdur, hydralazine per cardiology  DM type 2, uncontrolled, hyperglycemia, with complication in long-term insulin use -Ha1c 12.3  -Home insulin regimen -SSI  -DM coordinator consulted   HLD -Continue Lipitor   Lung nodule -Need follow up non-contrast chest CT in 3-6 months   Medical noncompliance -States she ran out of medicine, cannot afford medicine, does not know who her doctors are or how to follow up or who to call. Case management assisting  -Discussed extensively today    Discharge Instructions  Discharge Instructions    (HEART FAILURE PATIENTS) Call MD:  Anytime you have any of the following symptoms: 1) 3 pound weight gain in 24 hours or 5 pounds in 1 week 2) shortness of breath, with or without a dry hacking cough 3) swelling in the hands, feet or stomach 4) if you have to  sleep on extra pillows at night in order to breathe.    Complete by:  As directed    Call MD for:  difficulty breathing, headache or visual disturbances    Complete by:  As directed    Call MD for:  extreme fatigue     Complete by:  As directed    Call MD for:  persistant dizziness or light-headedness    Complete by:  As directed    Diet - low sodium heart healthy    Complete by:  As directed    Discharge instructions    Complete by:  As directed    You were cared for by a hospitalist during your hospital stay. If you have any questions about your discharge medications or the care you received while you were in the hospital after you are discharged, you can call the unit and asked to speak with the hospitalist on call if the hospitalist that took care of you is not available. Once you are discharged, your primary care physician will handle any further medical issues. Please note that NO REFILLS for any discharge medications will be authorized once you are discharged, as it is imperative that you return to your primary care physician (or establish a relationship with a primary care physician if you do not have one) for your aftercare needs so that they can reassess your need for medications and monitor your lab values.   Increase activity slowly    Complete by:  As directed      Allergies as of 06/12/2016      Reactions   Lisinopril Hives, Swelling   Facial    Sertraline Hives, Swelling   Facial    Tramadol Hives, Swelling   facial   Ibuprofen Hives, Swelling      Medication List    STOP taking these medications   cloNIDine 0.1 MG tablet Commonly known as:  CATAPRES   clopidogrel 75 MG tablet Commonly known as:  PLAVIX   predniSONE 10 MG tablet Commonly known as:  DELTASONE     TAKE these medications   albuterol 108 (90 Base) MCG/ACT inhaler Commonly known as:  PROVENTIL HFA;VENTOLIN HFA Inhale 1 puff into the lungs every 6 (six) hours as needed for wheezing or shortness of breath.   aspirin 81 MG chewable tablet Chew 1 tablet (81 mg total) by mouth daily.   atorvastatin 80 MG tablet Commonly known as:  LIPITOR Take 1 tablet (80 mg total) by mouth daily at 6 PM.   blood glucose meter  kit and supplies Dispense based on patient and insurance preference. Use up to four times daily as directed. (FOR ICD-9 250.00, 250.01).   carvedilol 6.25 MG tablet Commonly known as:  COREG Take 1 tablet (6.25 mg total) by mouth 2 (two) times daily with a meal. What changed:  medication strength  how much to take   furosemide 40 MG tablet Commonly known as:  LASIX Take 1 tablet (40 mg total) by mouth 2 (two) times daily.   hydrALAZINE 10 MG tablet Commonly known as:  APRESOLINE Take 1 tablet (10 mg total) by mouth 3 (three) times daily.   insulin NPH-regular Human (70-30) 100 UNIT/ML injection Commonly known as:  NOVOLIN 70/30 Inject 17 units in the AM, inject 8 units in PM 12 hours later 10 minutes before meals   INSULIN SYRINGE .3CC/31GX5/16" 31G X 5/16" 0.3 ML Misc Use with insulin, twice daily   isosorbide mononitrate 30 MG 24 hr tablet Commonly known as:  IMDUR Take 0.5 tablets (15 mg total) by mouth daily. Start taking on:  06/13/2016   metFORMIN 1000 MG tablet Commonly known as:  GLUCOPHAGE Take 1 tablet (1,000 mg total) by mouth 2 (two) times daily with a meal. What changed:  how much to take  additional instructions   potassium chloride 10 MEQ tablet Commonly known as:  K-DUR,KLOR-CON Take 1 tablet (10 mEq total) by mouth 2 (two) times daily.      Follow-up Information    [facility] on 06/18/2016.   Why:  at 10:45am for a hospital follow up appointment with Dr Janne Napoleon.  Contact information: [place] [id number] [place], [place] Follow up on 06/19/2016.   Specialty:  Cardiology Why:  3:00 PM cardiology appointment.  Contact information: [address] [place] [address] [phone]          Allergies  Allergen Reactions  . Lisinopril Hives and Swelling    Facial   . Sertraline Hives and Swelling    Facial   . Tramadol Hives and  Swelling    facial  . Ibuprofen Hives and Swelling    Consultations:  Cardiology   Procedures/Studies: Dg Chest 2 View  Result Date: 06/10/2016 CLINICAL DATA:  Cough and shortness of breath. Diabetes and hypertension. EXAM: CHEST  2 VIEW COMPARISON:  03/13/2015 FINDINGS: The heart size and mediastinal contours are within normal limits. Both lungs are clear. The visualized skeletal structures are unremarkable. IMPRESSION: No active cardiopulmonary disease. Electronically Signed   By: Earle Gell M.D.   On: 06/10/2016 08:21   Ct Angio Neck W Or Wo Contrast  Result Date: 06/11/2016 CLINICAL DATA:  History of carotid dissection follow-up EXAM: CT ANGIOGRAPHY NECK TECHNIQUE: Multidetector CT imaging of the neck was performed using the standard protocol during bolus administration of intravenous contrast. Multiplanar CT image reconstructions and MIPs were obtained to evaluate the vascular anatomy. Carotid stenosis measurements (when applicable) are obtained utilizing NASCET criteria, using the distal internal carotid diameter as the denominator. CONTRAST:  100 mL Isovue 370 IV COMPARISON:  CTA neck 02/25/2015 FINDINGS: Aortic arch: Normal aortic arch.  Three vessel aortic arch. Right carotid system: Mild atherosclerotic disease in the carotid bulb on the right with small calcification. No significant stenosis. This plaque is mildly ulcerated as noted previously. Left carotid system: Interval healing of dissection in the left common carotid artery. Mild residual thickening of the medial wall of the carotid without significant stenosis. No dissection flap. Left carotid bifurcation widely patent without stenosis or atherosclerotic disease Vertebral arteries: Negative for dissection or stenosis. Skeleton: Cervical kyphosis. Disc degeneration and spondylosis at C5-6. No acute skeletal abnormality. Other neck: Negative for adenopathy or mass in the neck. Upper chest: Lung apices clear. IMPRESSION: Interval  healing of left common carotid artery dissection since the prior study. No significant stenosis identified. Mild atherosclerotic calcification in the right carotid bulb with a small ulcerated plaque similar to the prior study. No significant stenosis. Electronically Signed   By: Franchot Gallo M.D.   On: 06/11/2016 14:59   Ct Angio Chest Pe W/cm &/or Wo Cm  Result Date: 06/10/2016 CLINICAL DATA:  Shortness of breath and chest pain EXAM: CT ANGIOGRAPHY CHEST WITH CONTRAST TECHNIQUE: Multidetector CT imaging of the chest was performed using the standard protocol during bolus administration of intravenous contrast. Multiplanar CT image reconstructions and MIPs were obtained to evaluate the vascular anatomy. CONTRAST:  100 mL Isovue 370 nonionic COMPARISON:  Chest radiograph June 10, 2016 and chest CT May 18, 2011 FINDINGS: Cardiovascular: There is no demonstrable pulmonary embolus. There is no thoracic aortic aneurysm. No dissection is seen; the contrast bolus is was timed to optimize pulmonary arterial vascular visualization and does not sufficiently opacified the thoracic aorta to confidently exclude dissection as a differential consideration based on imaging appearance. Visualized great vessels appear unremarkable. There are scattered foci of atherosclerotic calcification in the aorta. Pericardium is not appreciably thickened. There are scattered foci of coronary artery calcification. Mediastinum/Nodes: Thyroid appears unremarkable. There are several lymph nodes adjacent to the aortic arch on the left which are present. One of these lymph nodes is borderline prominent measuring 1.2 x 1.0 cm. Other lymph nodes in this area are subcentimeter. There is a sub- carinal lymph node measuring 1.8 x 1.3 cm. There is a small amount of air in the distal esophagus with a small hiatal hernia. Lungs/Pleura: There are scattered nodular appearing opacities bilaterally, significantly less pronounced than on prior study from  2013. Several of these nodular opacities have a somewhat semi-solid appearance. Nodular opacities range in size from as small as 3 mm to as large as 1.5 x 1 cm. These nodular lesions are seen throughout the lungs bilaterally. There are pleural effusions bilaterally, larger on the right than on the left. There is no new opacity in the lungs compared to the previous study. Upper Abdomen: There is reflux of contrast into the inferior vena cava and hepatic veins. Visualized upper abdominal structures otherwise appear unremarkable except for aortic atherosclerosis. Musculoskeletal: There are no blastic or lytic bone lesions. Review of the MIP images confirms the above findings. IMPRESSION: No demonstrable pulmonary embolus. No thoracic aortic aneurysm. While no aortic dissection is evident, the contrast bolus is not sufficient in the aorta to exclude this entity is a potential diagnostic consideration from an imaging standpoint. Multiple nodular opacities are noted bilaterally, some of which are best described as semi-solid. Largest of these nodular opacities measures approximately 1.5 cm. There is considerably less airspace opacity compared to the previous study, and the nodular opacities which are currently present may represent residua from the prior more diffuse airspace consolidation noted 5 years prior. Given the current appearance, non-contrast chest CT at 3-6 months is recommended. If nodules persist, subsequent management will be based upon the most suspicious nodule(s). This recommendation follows the consensus statement: Guidelines for Management of Incidental Pulmonary Nodules Detected on CT Images: From the Fleischner Society 2017; Radiology 2017; 284:228-243. If patient has clinical symptoms concerning for ongoing parenchymal lung disease, Pulmonary Medicine consultation with consideration for bronchoscopy may be reasonable. There are pleural effusions bilaterally, larger on the right than on the left. No  pulmonary edema. Mild adenopathy of uncertain etiology. Small hiatal hernia. Reflux of contrast into the inferior vena cava and hepatic veins may be indicative of increased right heart pressure. Scattered areas of atherosclerotic calcification including foci of coronary artery calcification noted. Electronically Signed   By: Lowella Grip III M.D.   On: 06/10/2016 10:23    Echo  Study Conclusions  - Left ventricle: The cavity size was severely dilated. Wall   thickness was normal. The estimated ejection fraction was 25%.   Diffuse hypokinesis. - Mitral valve: There was moderate regurgitation. - Left atrium: The atrium was mildly dilated. - Atrial septum: No defect or patent foramen ovale was identified.   Discharge Exam: Vitals:   06/12/16 0449 06/12/16 1329  BP: 127/84 111/63  Pulse: 88 92  Resp: 18 18  Temp: 97.9 F (36.6 C) 98.2 F (36.8 C)   Vitals:   06/11/16 1437 06/11/16 2110 06/12/16 0449 06/12/16 1329  BP: 111/77 124/72 127/84 111/63  Pulse: 84 84 88 92  Resp: 18 18 18 18   Temp: 97.6 F (36.4 C) 98.1 F (36.7 C) 97.9 F (36.6 C) 98.2 F (36.8 C)  TempSrc: Oral Oral Oral Oral  SpO2: 100% 100% 100% 99%  Weight:   91.6 kg (201 lb 14.4 oz)   Height:        General: Pt is alert, awake, not in acute distress Cardiovascular: RRR, S1/S2 +, no rubs, no gallops Respiratory: CTA bilaterally, no wheezing, no rhonchi Abdominal: Soft, NT, ND, bowel sounds + Extremities: no edema, no cyanosis    The results of significant diagnostics from this hospitalization (including imaging, microbiology, ancillary and laboratory) are listed below for reference.     Microbiology: No results found for this or any previous visit (from the past 240 hour(s)).   Labs: BNP (last 3 results)  Recent Labs  06/10/16 0822  BNP 016.0*   Basic Metabolic Panel:  Recent Labs Lab 06/10/16 0820 06/10/16 0822 06/11/16 0556 06/12/16 0401  NA  --  131* 136 138  K  --  4.0 3.9  3.4*  CL  --  100* 101 101  CO2  --  22 25 30   GLUCOSE  --  375* 173* 131*  BUN  --  14 16 14   CREATININE  --  0.81 0.74 0.91  CALCIUM  --  8.8* 8.7* 8.8*  MG 1.6*  --   --  1.6*   Liver Function Tests:  Recent Labs Lab 06/10/16 0822  AST 37  ALT 38  ALKPHOS 128*  BILITOT 1.2  PROT 6.8  ALBUMIN 3.5   No results for input(s): LIPASE, AMYLASE in the last 168 hours. No results for input(s): AMMONIA in the last 168 hours. CBC:  Recent Labs Lab 06/10/16 0822  WBC 8.1  NEUTROABS 4.6  HGB 12.8  HCT 38.0  MCV 86.2  PLT 316   Cardiac Enzymes:  Recent Labs Lab 06/12/16 1235  TROPONINI 1.10*   BNP: Invalid input(s): POCBNP CBG:  Recent Labs Lab 06/11/16 1159 06/11/16 1715 06/11/16 2107 06/12/16 0725 06/12/16 1228  GLUCAP 196* 122* 140* 173* 249*   D-Dimer No results for input(s): DDIMER in the last 72 hours. Hgb A1c  Recent Labs  06/10/16 1137  HGBA1C 12.3*   Lipid Profile No results for input(s): CHOL, HDL, LDLCALC, TRIG, CHOLHDL, LDLDIRECT in the last 72 hours. Thyroid function studies  Recent Labs  06/10/16 1259  TSH 1.895   Anemia work up No results for input(s): VITAMINB12, FOLATE, FERRITIN, TIBC, IRON, RETICCTPCT in the last 72 hours. Urinalysis    Component Value Date/Time   COLORURINE YELLOW 07/02/2007 1121   APPEARANCEUR HAZY (A) 07/02/2007 1121   LABSPEC >1.030 (H) 07/02/2007 1121   PHURINE 5.5 07/02/2007 1121   GLUCOSEU NEGATIVE 07/02/2007 1121   HGBUR LARGE (A) 07/02/2007 1121   BILIRUBINUR NEGATIVE 07/02/2007 1121   KETONESUR NEGATIVE 07/02/2007 1121   PROTEINUR NEGATIVE 07/02/2007 1121   UROBILINOGEN 0.2 07/02/2007 1121   NITRITE NEGATIVE 07/02/2007 1121   LEUKOCYTESUR NEGATIVE 07/02/2007 1121   Sepsis Labs Invalid input(s): PROCALCITONIN,  WBC,  LACTICIDVEN Microbiology No results found for this or any previous visit (from the past 240 hour(s)).   Time coordinating discharge: 40 minutes  SIGNED:  Dessa Phi,  DO [facility]  Pager [phone]  If 7PM-7AM, please contact night-coverage [email or web address] Password [email or web address] 06/12/2016, 1:44 PM

## 2016-06-13 MED FILL — ?METFORMIN HCL 1,000 MG TAB: 1000 | 30 days supply | Qty: 60 | Fill #0

## 2016-06-13 MED FILL — TRUEPLUS SYR 0.5ML 31GX5/16: 31G X 5/16" | 30 days supply | Qty: 100 | Fill #0

## 2016-06-13 MED FILL — ?CARVEDILOL 6.25 MG TABLET: 6.25 | 30 days supply | Qty: 60 | Fill #0

## 2016-06-13 MED FILL — ?ISOSORBIDE MN ER 30 MG TAB: 30 | 60 days supply | Qty: 30 | Fill #0

## 2016-06-13 MED FILL — FUROSEMIDE 40 MG TABLET: 40 | 30 days supply | Qty: 60 | Fill #0

## 2016-06-13 MED FILL — hydrALAZINE HCL 10 MG TABS: 10 | 30 days supply | Qty: 90 | Fill #0

## 2016-06-13 MED FILL — ATORVASTATIN 80 MG TABLET: 80 | 30 days supply | Qty: 30 | Fill #0

## 2016-06-13 MED FILL — POTASSIUM CL 10 MEQ TAB SA: 10 | 30 days supply | Qty: 60 | Fill #0

## 2016-06-13 MED FILL — !NOVOLIN 70/30 100 UNITS/ML: (70-30) 100 | 28 days supply | Qty: 10 | Fill #0

## 2016-06-13 MED FILL — !VENTOLIN HFA INHALER: 108 (90 BAS | 25 days supply | Qty: 18 | Fill #0

## 2016-06-13 NOTE — Telephone Encounter (Signed)
Patient contacted regarding discharge from [facility] on 06/12/16  Patient understands to follow up with provider crenshaw on 06/19/16 at 3:20 pm at [facility] Patient understands discharge instructions? Yes  Patient understands medications and regiment? yes Patient understands to bring all medications to this visit? yes

## 2016-06-18 ENCOUNTER — Inpatient Hospital Stay: Payer: Medicaid Other | Admitting: Internal Medicine

## 2016-06-18 ENCOUNTER — Ambulatory Visit: Payer: Self-pay | Attending: Internal Medicine

## 2016-06-19 ENCOUNTER — Encounter: Payer: Self-pay | Admitting: Cardiology

## 2016-06-19 ENCOUNTER — Ambulatory Visit (INDEPENDENT_AMBULATORY_CARE_PROVIDER_SITE_OTHER): Payer: Self-pay | Admitting: Cardiology

## 2016-06-19 VITALS — BP 110/78 | HR 98 | Ht 69.0 in | Wt 196.0 lb

## 2016-06-19 DIAGNOSIS — I428 Other cardiomyopathies: Secondary | ICD-10-CM

## 2016-06-19 DIAGNOSIS — I10 Essential (primary) hypertension: Secondary | ICD-10-CM

## 2016-06-19 DIAGNOSIS — I5022 Chronic systolic (congestive) heart failure: Secondary | ICD-10-CM

## 2016-06-19 MED ORDER — SPIRONOLACTONE 25 MG PO TABS: 25.0000 mg | ORAL_TABLET | Freq: Every day | ORAL | 3 refills | Status: DC

## 2016-06-19 NOTE — Patient Instructions (Signed)
Medication Instructions:   START SPIRONOLACTONE 25 MG ONCE DAILY  Labwork:  Your physician recommends that you return for lab work in: ONE WEEK  Testing/Procedures:  Your physician has requested that you have an echocardiogram. Echocardiography is a painless test that uses sound waves to create images of your heart. It provides your doctor with information about the size and shape of your heart and how well your heart's chambers and valves are working. This procedure takes approximately one hour. There are no restrictions for this procedure.SCHEDULE IN 3 MONTHS    Follow-Up:  Your physician recommends that you schedule a follow-up appointment in: WITH DR CRENSHAW ONCE ECHO COMPLETE   If you need a refill on your cardiac medications before your next appointment, please call your pharmacy.

## 2016-06-20 ENCOUNTER — Encounter: Payer: Self-pay | Admitting: Internal Medicine

## 2016-06-20 ENCOUNTER — Ambulatory Visit: Payer: Medicaid Other | Attending: Internal Medicine | Admitting: Internal Medicine

## 2016-06-20 VITALS — BP 100/67 | HR 92 | Temp 98.1°F | Resp 16 | Wt 198.4 lb

## 2016-06-20 DIAGNOSIS — I110 Hypertensive heart disease with heart failure: Secondary | ICD-10-CM | POA: Diagnosis present

## 2016-06-20 DIAGNOSIS — I5043 Acute on chronic combined systolic (congestive) and diastolic (congestive) heart failure: Secondary | ICD-10-CM | POA: Insufficient documentation

## 2016-06-20 DIAGNOSIS — Z23 Encounter for immunization: Secondary | ICD-10-CM

## 2016-06-20 DIAGNOSIS — Z79899 Other long term (current) drug therapy: Secondary | ICD-10-CM | POA: Diagnosis not present

## 2016-06-20 DIAGNOSIS — Z794 Long term (current) use of insulin: Secondary | ICD-10-CM | POA: Diagnosis not present

## 2016-06-20 DIAGNOSIS — I10 Essential (primary) hypertension: Secondary | ICD-10-CM

## 2016-06-20 DIAGNOSIS — E1165 Type 2 diabetes mellitus with hyperglycemia: Secondary | ICD-10-CM | POA: Diagnosis not present

## 2016-06-20 DIAGNOSIS — I420 Dilated cardiomyopathy: Secondary | ICD-10-CM | POA: Diagnosis not present

## 2016-06-20 DIAGNOSIS — Z7982 Long term (current) use of aspirin: Secondary | ICD-10-CM | POA: Insufficient documentation

## 2016-06-20 LAB — GLUCOSE, POCT (MANUAL RESULT ENTRY): POC GLUCOSE: 280 mg/dL — AB (ref 70–99)

## 2016-06-20 MED FILL — SPIRONOLACTONE 25 MG TABLET: 25 | 30 days supply | Qty: 30 | Fill #0

## 2016-06-20 NOTE — Progress Notes (Addendum)
[name], is a 51 y.o. female  CBS:[id number]  WGY:[id number]  DOB - 1965-06-29  Chief Complaint  Patient presents with  . Hospitalization Follow-up        Subjective:   [name] is a 51 y.o. female here today for a hospital follow up visit.  New to our clinic. She was hospitalized 4/29-5/1 for acute on chronic combined chf for noncompliance (ran out of meds, no insurance) and ooc dm2, htn.  Trop was elevated for demand ischemia.  Recent echo 05/30/16 ef 25, mod MR, mild LAE.  She has hx of carotid dissection in past, noted healed on CTA 4/18.  Chest CT 4/18 noted lung nodules, fu recd 3-54month.  She saw cardiology yesterday, but has not picked up the newly started spironolactone yet.  She has been taking insulin for only about 4-5 days now.  Feeling much better. Denies cp/orthopnea/pnd/le edema/syncope/dizziness. Some abd discomfort w/ metformin, but still taking.  Patient has No headache, No chest pain, No abdominal pain - No Nausea, No new weakness tingling or numbness, No Cough - SOB.  No problems updated.  ALLERGIES: Allergies  Allergen Reactions  . Lisinopril Hives and Swelling    Facial   . Sertraline Hives and Swelling    Facial   . Tramadol Hives and Swelling    facial  . Ibuprofen Hives and Swelling    PAST MEDICAL HISTORY: Past Medical History:  Diagnosis Date  . Diabetes mellitus, type 2 (HCC)    A1C 7.6 April '13  . Hypertension   . Left leg pain 07/21/2015  . Nonischemic cardiomyopathy ([place]    Echo 05/19/11 EF 35% w/ mild-mod MR; R/L Heart Cath 05/21/11 - mildly elevated right heart pressures, normal left heart pressures, preserved cardiac output, widely patent coronary arteries without significant CAD and moderate global systolic LV dysfunction, EF 35-40%.  . 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 Systolic CHF ([place]    Echo 05/19/11 EF 35% w/ mild-mod MR    MEDICATIONS AT HOME: Prior to Admission medications    Medication Sig Start Date End Date Taking? Authorizing Provider  albuterol (PROVENTIL HFA;VENTOLIN HFA) 108 (90 Base) MCG/ACT inhaler Inhale 1 puff into the lungs every 6 (six) hours as needed for wheezing or shortness of breath. 06/12/16   CDessa PhiChahn-Yang, DO  aspirin 81 MG chewable tablet Chew 1 tablet (81 mg total) by mouth daily. 06/12/16   CDessa PhiChahn-Yang, DO  atorvastatin (LIPITOR) 80 MG tablet Take 1 tablet (80 mg total) by mouth daily at 6 PM. 06/12/16   CDessa PhiChahn-Yang, DO  blood glucose meter kit and supplies Dispense based on patient and insurance preference. Use up to four times daily as directed. (FOR ICD-9 250.00, 250.01). 06/12/16   CDessa PhiChahn-Yang, DO  carvedilol (COREG) 6.25 MG tablet Take 1 tablet (6.25 mg total) by mouth 2 (two) times daily with a meal. 06/12/16   CDessa PhiChahn-Yang, DO  furosemide (LASIX) 40 MG tablet Take 1 tablet (40 mg total) by mouth 2 (two) times daily. 06/12/16   CDessa PhiChahn-Yang, DO  hydrALAZINE (APRESOLINE) 10 MG tablet Take 1 tablet (10 mg total) by mouth 3 (three) times daily. 06/12/16   CDessa PhiChahn-Yang, DO  insulin NPH-regular Human (NOVOLIN 70/30) (70-30) 100 UNIT/ML injection Inject 17 units in the AM, inject 8 units in PM 12 hours later 10 minutes before meals 06/12/16   CDessa PhiChahn-Yang, DO  Insulin Syringe-Needle U-100 (INSULIN  SYRINGE .3CC/31GX5/16") 31G X 5/16" 0.3 ML MISC Use with insulin, twice daily 06/12/16   Dessa Phi Chahn-Yang, DO  isosorbide mononitrate (IMDUR) 30 MG 24 hr tablet Take 0.5 tablets (15 mg total) by mouth daily. 06/13/16   Dessa Phi Chahn-Yang, DO  metFORMIN (GLUCOPHAGE) 1000 MG tablet Take 1 tablet (1,000 mg total) by mouth 2 (two) times daily with a meal. 06/12/16   Dessa Phi Chahn-Yang, DO  potassium chloride (K-DUR,KLOR-CON) 10 MEQ tablet Take 1 tablet (10 mEq total) by mouth 2 (two) times daily. 06/12/16   Dessa Phi Chahn-Yang, DO  spironolactone  (ALDACTONE) 25 MG tablet Take 1 tablet (25 mg total) by mouth daily. 06/19/16 09/17/16  Lelon Perla, MD     Objective:   Vitals:   06/20/16 0930  BP: 100/67  Pulse: 92  Resp: 16  Temp: 98.1 F (36.7 C)  TempSrc: Oral  SpO2: 99%  Weight: 198 lb 6.4 oz (90 kg)    Exam General appearance : Awake, alert, not in any distress. Speech Clear. Not toxic looking,  HEENT: Atraumatic and Normocephalic, pupils equally reactive to light. Neck: supple, no JVD.  Chest:Good air entry bilaterally, no added sounds. CVS: S1 S2 regular, no murmurs/gallups or rubs. Abdomen: Bowel sounds active, Non tender and not distended with no gaurding, rigidity or rebound. Extremities: B/L Lower Ext shows no edema, both legs are warm to touch Neurology: Awake alert, and oriented X 3, CN II-XII grossly intact, Non focal Skin:No Rash  Data Review Lab Results  Component Value Date   HGBA1C 12.3 (H) 06/10/2016   HGBA1C 9.9 07/20/2015   HGBA1C 11.4 05/05/2015    Depression screen PHQ 2/9 06/20/2016 08/01/2015 07/20/2015 05/05/2015 03/18/2015  Decreased Interest 1 0 1 0 1  Down, Depressed, Hopeless 1 0 1 0 1  PHQ - 2 Score 2 0 2 0 2  Altered sleeping 1 - 1 - -  Tired, decreased energy 1 - 1 - -  Change in appetite 1 - 1 - -  Feeling bad or failure about yourself  1 - 3 - -  Trouble concentrating 1 - 3 - -  Moving slowly or fidgety/restless 1 - 3 - -  Suicidal thoughts 1 - 1 - -  PHQ-9 Score 9 - 15 - -  Difficult doing work/chores - - Very difficult - -    Ct neck 06/11/16 IMPRESSION: Interval healing of left common carotid artery dissection since the prior study. No significant stenosis identified.  Mild atherosclerotic calcification in the right carotid bulb with a small ulcerated plaque similar to the prior study. No significant stenosis.   Electronically Signed   By: Franchot Gallo M.D.   On: 06/11/2016 14:59  06/10/16 ct lungs IMPRESSION: No demonstrable pulmonary embolus. No thoracic  aortic aneurysm. While no aortic dissection is evident, the contrast bolus is not sufficient in the aorta to exclude this entity is a potential diagnostic consideration from an imaging standpoint.  Multiple nodular opacities are noted bilaterally, some of which are best described as semi-solid. Largest of these nodular opacities measures approximately 1.5 cm. There is considerably less airspace opacity compared to the previous study, and the nodular opacities which are currently present may represent residua from the prior more diffuse airspace consolidation noted 5 years prior. Given the current appearance, non-contrast chest CT at 3-6 months is recommended. If nodules persist, subsequent management will be based upon the most suspicious nodule(s). This recommendation follows the consensus statement: Guidelines for Management of Incidental Pulmonary Nodules Detected  on CT Images: From the Fleischner Society 2017; Radiology 2017; [phone]. If patient has clinical symptoms concerning for ongoing parenchymal lung disease, Pulmonary Medicine consultation with consideration for bronchoscopy may be reasonable.  There are pleural effusions bilaterally, larger on the right than on the left. No pulmonary edema.  Mild adenopathy of uncertain etiology.  Small hiatal hernia.  Reflux of contrast into the inferior vena cava and hepatic veins may be indicative of increased right heart pressure.  Scattered areas of atherosclerotic calcification including foci of coronary artery calcification noted.   Electronically Signed   By: Lowella Grip III M.D.   On: 06/10/2016 10:23  06/11/16 echo Study Conclusions  - Left ventricle: The cavity size was severely dilated. Wall   thickness was normal. The estimated ejection fraction was 25%.   Diffuse hypokinesis. - Mitral valve: There was moderate regurgitation. - Left atrium: The atrium was mildly dilated. - Atrial septum: No  defect or patent foramen ovale was identified.  Assessment & Plan   1. Acute on chronic combined systolic and diastolic CHF (congestive heart failure) (HCC) Appears euvolemic, saw cards yesterday, and was started on spironolactone. - encouraged her to pick up spironolactone. She has f/u bmp w/ cards next wk. - dw pt fluid restriction, low salt diet encouraged - info provided - on lasix as well - has acei allergy/angioedema  2. Benign essential HTN Controlled, same meds, per cards  3. Uncontrolled type 2 diabetes mellitus with hyperglycemia, without long-term current use of insulin ([place]) - uncontrolled, encouraged to cont metformin and nph. - POCT glucose (manual entry) - Microalbumin/Creatinine Ratio, Urine - fu [facility] dm check 2 wks - needs eye exam, not covered w/ financial aid when she gets though. Cheaper alternative would be optometry at [facility], etc.  4. DCM (dilated cardiomyopathy) ([place]) Etiology unclear.  Needs repeat echo 3 months. She is now agreeable for ICD if recd at that time.  5. pulm nodules - needs f/u ct in 3-61month, pt denies tob.  6. Pap smear next month  7. Financial aid  8. tdap and pneumoccoccal 23v today.   Patient have been counseled extensively about nutrition and exercise  Return in about 4 weeks (around 07/18/2016) for chf/ dm ooc/ papsmear.  The patient was given clear instructions to go to ER or return to medical center if symptoms don't improve, worsen or new problems develop. The patient verbalized understanding. The patient was told to call to get lab results if they haven't heard anything in the next week.   This note has been created with DSurveyor, quantity Any transcriptional errors are unintentional.   DMaren Reamer MD, MDe Wittand WMountain [facility] CenterG[facility] [place]  06/20/2016, 10:37 AM

## 2016-06-20 NOTE — Patient Instructions (Addendum)
Financial aid - Dm f/u w/ Misty Stanley - diabetes check  Td Vaccine (Tetanus and Diphtheria): What You Need to Know 1. Why get vaccinated? Tetanus  and diphtheria are very serious diseases. They are rare in the [place] today, but people who do become infected often have severe complications. Td vaccine is used to protect adolescents and adults from both of these diseases. Both tetanus and diphtheria are infections caused by bacteria. Diphtheria spreads from person to person through coughing or sneezing. Tetanus-causing bacteria enter the body through cuts, scratches, or wounds. TETANUS (lockjaw) causes painful muscle tightening and stiffness, usually all over the body.  It can lead to tightening of muscles in the head and neck so you can't open your mouth, swallow, or sometimes even breathe. Tetanus kills about 1 out of every 10 people who are infected even after receiving the best medical care. DIPHTHERIA can cause a thick coating to form in the back of the throat.  It can lead to breathing problems, paralysis, heart failure, and death. Before vaccines, as many as 200,000 cases of diphtheria and hundreds of cases of tetanus were reported in the [place] each year. Since vaccination began, reports of cases for both diseases have dropped by about 99%. 2. Td vaccine Td vaccine can protect adolescents and adults from tetanus and diphtheria. Td is usually given as a booster dose every 10 years but it can also be given earlier after a severe and dirty wound or burn. Another vaccine, called Tdap, which protects against pertussis in addition to tetanus and diphtheria, is sometimes recommended instead of Td vaccine. Your doctor or the person giving you the vaccine can give you more information. Td may safely be given at the same time as other vaccines. 3. Some people should not get this vaccine  A person who has ever had a life-threatening allergic reaction after a previous dose of any tetanus or  diphtheria containing vaccine, OR has a severe allergy to any part of this vaccine, should not get Td vaccine. Tell the person giving the vaccine about any severe allergies.  Talk to your doctor if you:  had severe pain or swelling after any vaccine containing diphtheria or tetanus,  ever had a condition called Guillain Barre Syndrome (GBS),  aren't feeling well on the day the shot is scheduled. 4. What are the risks from Td vaccine? With any medicine, including vaccines, there is a chance of side effects. These are usually mild and go away on their own. Serious reactions are also possible but are rare. Most people who get Td vaccine do not have any problems with it. Mild problems following Td vaccine:  (Did not interfere with activities)  Pain where the shot was given (about 8 people in 10)  Redness or swelling where the shot was given (about 1 person in 4)  Mild fever (rare)  Headache (about 1 person in 4)  Tiredness (about 1 person in 4) Moderate problems following Td vaccine:  (Interfered with activities, but did not require medical attention)  Fever over 102F (rare) Severe problems following Td vaccine:  (Unable to perform usual activities; required medical attention)  Swelling, severe pain, bleeding and/or redness in the arm where the shot was given (rare). Problems that could happen after any vaccine:   People sometimes faint after a medical procedure, including vaccination. Sitting or lying down for about 15 minutes can help prevent fainting, and injuries caused by a fall. Tell your doctor if you feel dizzy, or have vision  changes or ringing in the ears.  Some people get severe pain in the shoulder and have difficulty moving the arm where a shot was given. This happens very rarely.  Any medication can cause a severe allergic reaction. Such reactions from a vaccine are very rare, estimated at fewer than 1 in a million doses, and would happen within a few minutes to a few  hours after the vaccination. As with any medicine, there is a very remote chance of a vaccine causing a serious injury or death. The safety of vaccines is always being monitored. For more information, visit: http://floyd.org/ 5. What if there is a serious reaction? What should I look for?  Look for anything that concerns you, such as signs of a severe allergic reaction, very high fever, or unusual behavior. Signs of a severe allergic reaction can include hives, swelling of the face and throat, difficulty breathing, a fast heartbeat, dizziness, and weakness. These would usually start a few minutes to a few hours after the vaccination. What should I do?   If you think it is a severe allergic reaction or other emergency that can't wait, call 9-1-1 or get the person to the nearest hospital. Otherwise, call your doctor.  Afterward, the reaction should be reported to the Vaccine Adverse Event Reporting System (VAERS). Your doctor might file this report, or you can do it yourself through the VAERS web site at www.vaers.LAgents.no, or by calling 1-423-194-9522.  VAERS does not give medical advice. 6. The National Vaccine Injury Compensation Program The [employer] Vaccine Injury Compensation Program (VICP) is a federal program that was created to compensate people who may have been injured by certain vaccines. Persons who believe they may have been injured by a vaccine can learn about the program and about filing a claim by calling 1-(920) 668-8849 or visiting the VICP website at SpiritualWord.at. There is a time limit to file a claim for compensation. 7. How can I learn more?  Ask your doctor. He or she can give you the vaccine package insert or suggest other sources of information.  Call your local or state health department.  Contact the Centers for Disease Control and Prevention (CDC):  Call 458 716 8383 (1-800-CDC-INFO)  Visit CDC's website at PicCapture.uy CDC Td  Vaccine VIS (05/24/15) This information is not intended to replace advice given to you by your health care provider. Make sure you discuss any questions you have with your health care provider. Document Released: 11/26/2005 Document Revised: 10/20/2015 Document Reviewed: 10/20/2015 Elsevier Interactive Patient Education  2017 Elsevier Inc.  -   Low-Sodium Eating Plan Sodium, which is an element that makes up salt, helps you maintain a healthy balance of fluids in your body. Too much sodium can increase your blood pressure and cause fluid and waste to be held in your body. Your health care provider or dietitian may recommend following this plan if you have high blood pressure (hypertension), kidney disease, liver disease, or heart failure. Eating less sodium can help lower your blood pressure, reduce swelling, and protect your heart, liver, and kidneys. What are tips for following this plan? General guidelines   Most people on this plan should limit their sodium intake to 1,500-2,000 mg (milligrams) of sodium each day. Reading food labels   The Nutrition Facts label lists the amount of sodium in one serving of the food. If you eat more than one serving, you must multiply the listed amount of sodium by the number of servings.  Choose foods with less than 140 mg  of sodium per serving.  Avoid foods with 300 mg of sodium or more per serving. Shopping   Look for lower-sodium products, often labeled as "low-sodium" or "no salt added."  Always check the sodium content even if foods are labeled as "unsalted" or "no salt added".  Buy fresh foods.  Avoid canned foods and premade or frozen meals.  Avoid canned, cured, or processed meats  Buy breads that have less than 80 mg of sodium per slice. Cooking   Eat more home-cooked food and less restaurant, buffet, and fast food.  Avoid adding salt when cooking. Use salt-free seasonings or herbs instead of table salt or sea salt. Check with your  health care provider or pharmacist before using salt substitutes.  Cook with plant-based oils, such as canola, sunflower, or olive oil. Meal planning   When eating at a restaurant, ask that your food be prepared with less salt or no salt, if possible.  Avoid foods that contain MSG (monosodium glutamate). MSG is sometimes added to [place] food, bouillon, and some canned foods. What foods are recommended? The items listed may not be a complete list. Talk with your dietitian about what dietary choices are best for you. Grains  Low-sodium cereals, including oats, puffed wheat and rice, and shredded wheat. Low-sodium crackers. Unsalted rice. Unsalted pasta. Low-sodium bread. Whole-grain breads and whole-grain pasta. Vegetables  Fresh or frozen vegetables. "No salt added" canned vegetables. "No salt added" tomato sauce and paste. Low-sodium or reduced-sodium tomato and vegetable juice. Fruits  Fresh, frozen, or canned fruit. Fruit juice. Meats and other protein foods  Fresh or frozen (no salt added) meat, poultry, seafood, and fish. Low-sodium canned tuna and salmon. Unsalted nuts. Dried peas, beans, and lentils without added salt. Unsalted canned beans. Eggs. Unsalted nut butters. Dairy  Milk. Soy milk. Cheese that is naturally low in sodium, such as ricotta cheese, fresh mozzarella, or Swiss cheese Low-sodium or reduced-sodium cheese. Cream cheese. Yogurt. Fats and oils  Unsalted butter. Unsalted margarine with no trans fat. Vegetable oils such as canola or olive oils. Seasonings and other foods  Fresh and dried herbs and spices. Salt-free seasonings. Low-sodium mustard and ketchup. Sodium-free salad dressing. Sodium-free light mayonnaise. Fresh or refrigerated horseradish. Lemon juice. Vinegar. Homemade, reduced-sodium, or low-sodium soups. Unsalted popcorn and pretzels. Low-salt or salt-free chips. What foods are not recommended? The items listed may not be a complete list. Talk with your  dietitian about what dietary choices are best for you. Grains  Instant hot cereals. Bread stuffing, pancake, and biscuit mixes. Croutons. Seasoned rice or pasta mixes. Noodle soup cups. Boxed or frozen macaroni and cheese. Regular salted crackers. Self-rising flour. Vegetables  Sauerkraut, pickled vegetables, and relishes. Olives. [place] fries. Onion rings. Regular canned vegetables (not low-sodium or reduced-sodium). Regular canned tomato sauce and paste (not low-sodium or reduced-sodium). Regular tomato and vegetable juice (not low-sodium or reduced-sodium). Frozen vegetables in sauces. Meats and other protein foods  Meat or fish that is salted, canned, smoked, spiced, or pickled. Bacon, ham, sausage, hotdogs, corned beef, chipped beef, packaged lunch meats, salt pork, jerky, pickled herring, anchovies, regular canned tuna, sardines, salted nuts. Dairy  Processed cheese and cheese spreads. Cheese curds. Blue cheese. Feta cheese. String cheese. Regular cottage cheese. Buttermilk. Canned milk. Fats and oils  Salted butter. Regular margarine. Ghee. Bacon fat. Seasonings and other foods  Onion salt, garlic salt, seasoned salt, table salt, and sea salt. Canned and packaged gravies. Worcestershire sauce. Tartar sauce. Barbecue sauce. Teriyaki sauce. Soy sauce, including reduced-sodium.  Steak sauce. Fish sauce. Oyster sauce. Cocktail sauce. Horseradish that you find on the shelf. Regular ketchup and mustard. Meat flavorings and tenderizers. Bouillon cubes. Hot sauce and Tabasco sauce. Premade or packaged marinades. Premade or packaged taco seasonings. Relishes. Regular salad dressings. Salsa. Potato and tortilla chips. Corn chips and puffs. Salted popcorn and pretzels. Canned or dried soups. Pizza. Frozen entrees and pot pies. Summary  Eating less sodium can help lower your blood pressure, reduce swelling, and protect your heart, liver, and kidneys.  Most people on this plan should limit their sodium  intake to 1,500-2,000 mg (milligrams) of sodium each day.  Canned, boxed, and frozen foods are high in sodium. Restaurant foods, fast foods, and pizza are also very high in sodium. You also get sodium by adding salt to food.  Try to cook at home, eat more fresh fruits and vegetables, and eat less fast food, canned, processed, or prepared foods. This information is not intended to replace advice given to you by your health care provider. Make sure you discuss any questions you have with your health care provider. Document Released: 07/21/2001 Document Revised: 01/23/2016 Document Reviewed: 01/23/2016 Elsevier Interactive Patient Education  2017 Elsevier Inc.  -  Fluid Restriction Some health conditions may require you to restrict your fluid intake. This means that you need to limit the amount of fluid you drink each day. When you have a fluid restriction, you must carefully measure and keep track of the amount of fluid you drink. Your health care provider will identify the specific amount of fluid you are allowed each day. This amount may depend on several things, such as:  The amount of urine you produce in a day.  How much fluid you are keeping (retaining) in your body.  Your blood pressure. What is my plan? Your health care provider recommends that you limit your fluid intake to 1.5 - 1.8 liters per day. What counts toward my fluid intake? Your fluid intake includes all liquids that you drink, as well as any foods that become liquid at room temperature. The following are examples of some fluids that you will have to restrict:  Tea, coffee, soda, lemonade, milk, water, juice, sport drinks, and nutritional supplement beverages.  Alcoholic beverages.  Cream.  Gravy.  Ice cubes.  Soup and broth. The following are examples of foods that become liquid at room temperature. These foods will also count toward your fluid intake.  Ice cream and ice milk.  Frozen yogurt and  sherbet.  Frozen ice pops.  Flavored gelatin. How do I keep track of my fluid intake? Each morning, fill a jug with the amount of water that equals the amount of fluid you are allowed for the day. You can use this water as a guideline for fluid allowance. Each time you take in any form of fluid, including ice cubes and foods that become liquid at room temperature, pour an equal amount of water out of the container. This helps you to see how much fluid you are taking in. It also helps you to see how much of your fluid intake is left for the rest of the day. The following conversions may also be helpful in measuring your fluid intake:  1 cup equals 8 oz (240 mL).   cup equals 6 oz (180 mL).  ? cup equals 5? oz (160 mL).   cup equals 4 oz (120 mL).  ? cup equals 2? oz (80 mL).   cup equals 2 oz (60 mL).  2 Tbsp equals 1 oz (30 mL). What home care instructions should I follow while restricting fluids?  Make sure that you stay within the recommended limit each day. Always measure and keep track of your fluids, as well as any foods that turn liquid at room temperature.  Use small cups and glasses and learn to sip fluids slowly.  Add a slice of fresh lemon or lemon juice to water or ice. This helps to satisfy your thirst.  Freeze fruit juice or water in an ice cube tray. Use this as part of your fluid allowance. These cubes are useful for quenching your thirst. Measure the amount of liquid in each ice cube prior to freezing so you can subtract this amount from your day's allowance when you consume each frozen cube.  Try frozen fruits between meals, such as grapes or strawberries.  Swallow your pills along with meals or soft foods, such as applesauce or mashed potatoes. This helps you to save your fluid allowance for something that you enjoy.  Weigh yourself every day. Keeping track of your daily weight can help you and your health care provider to notice as soon as possible if you are  retaining too much fluid in your body.  Weigh yourself every morning after you urinate but before you eat breakfast.  Wear the same amount of clothing each time you weigh yourself.  Write down your daily weight. Give this weight record to your health care provider. If your weight is going up, you may be retaining too much fluid. Every 2 cups (480 mL) of fluid retained in the body becomes an extra 1 lb (0.45 kg) of body weight.  Avoid salty foods. These foods make you thirsty and make fluid control more difficult.  Brush your teeth often or rinse your mouth with mouthwash to help your dry mouth. Lemon wedges, hard sour candies, chewing gum, or breath spray may also help to moisten your mouth.  Keep the temperature in your home at a cooler level. Dry air increases thirst, so keep the air in your home as humid as possible.  Avoid being out in the hot sun, which can cause you to sweat and become thirsty. What are some signs that I may be taking in too much fluid? You may be taking in too much fluid if:  Your weight increases. Contact your health care provider if your weight increases 3 lb or more in a day or if it increases 5 lb or more in a week.  Your face, hands, legs, feet, and belly (abdomen) start to swell.  You have trouble breathing. This information is not intended to replace advice given to you by your health care provider. Make sure you discuss any questions you have with your health care provider. Document Released: 11/26/2006 Document Revised: 07/07/2015 Document Reviewed: 06/30/2013 Elsevier Interactive Patient Education  2017 Elsevier Inc.  -   Heart Failure Heart failure means your heart has trouble pumping blood. This makes it hard for your body to work well. Heart failure is usually a long-term (chronic) condition. You must take good care of yourself and follow your doctor's treatment plan. Follow these instructions at home:  Take your heart medicine as told by your  doctor.  Do not stop taking medicine unless your doctor tells you to.  Do not skip any dose of medicine.  Refill your medicines before they run out.  Take other medicines only as told by your doctor or pharmacist.  Stay active if told by  your doctor. The elderly and people with severe heart failure should talk with a doctor about physical activity.  Eat heart-healthy foods. Choose foods that are without trans fat and are low in saturated fat, cholesterol, and salt (sodium). This includes fresh or frozen fruits and vegetables, fish, lean meats, fat-free or low-fat dairy foods, whole grains, and high-fiber foods. Lentils and dried peas and beans (legumes) are also good choices.  Limit salt if told by your doctor.  Cook in a healthy way. Roast, grill, broil, bake, poach, steam, or stir-fry foods.  Limit fluids as told by your doctor.  Weigh yourself every morning. Do this after you pee (urinate) and before you eat breakfast. Write down your weight to give to your doctor.  Take your blood pressure and write it down if your doctor tells you to.  Ask your doctor how to check your pulse. Check your pulse as told.  Lose weight if told by your doctor.  Stop smoking or chewing tobacco. Do not use gum or patches that help you quit without your doctor's approval.  Schedule and go to doctor visits as told.  Nonpregnant women should have no more than 1 drink a day. Men should have no more than 2 drinks a day. Talk to your doctor about drinking alcohol.  Stop illegal drug use.  Stay current with shots (immunizations).  Manage your health conditions as told by your doctor.  Learn to manage your stress.  Rest when you are tired.  If it is really hot outside:  Avoid intense activities.  Use air conditioning or fans, or get in a cooler place.  Avoid caffeine and alcohol.  Wear loose-fitting, lightweight, and light-colored clothing.  If it is really cold outside:  Avoid intense  activities.  Layer your clothing.  Wear mittens or gloves, a hat, and a scarf when going outside.  Avoid alcohol.  Learn about heart failure and get support as needed.  Get help to maintain or improve your quality of life and your ability to care for yourself as needed. Contact a doctor if:  You gain weight quickly.  You are more short of breath than usual.  You cannot do your normal activities.  You tire easily.  You cough more than normal, especially with activity.  You have any or more puffiness (swelling) in areas such as your hands, feet, ankles, or belly (abdomen).  You cannot sleep because it is hard to breathe.  You feel like your heart is beating fast (palpitations).  You get dizzy or light-headed when you stand up. Get help right away if:  You have trouble breathing.  There is a change in mental status, such as becoming less alert or not being able to focus.  You have chest pain or discomfort.  You faint. This information is not intended to replace advice given to you by your health care provider. Make sure you discuss any questions you have with your health care provider. Document Released: 11/08/2007 Document Revised: 07/07/2015 Document Reviewed: 03/17/2012 Elsevier Interactive Patient Education  2017 Elsevier Inc.   - Diabetes Mellitus and Food It is important for you to manage your blood sugar (glucose) level. Your blood glucose level can be greatly affected by what you eat. Eating healthier foods in the appropriate amounts throughout the day at about the same time each day will help you control your blood glucose level. It can also help slow or prevent worsening of your diabetes mellitus. Healthy eating may even help you improve the  level of your blood pressure and reach or maintain a healthy weight. General recommendations for healthful eating and cooking habits include:  Eating meals and snacks regularly. Avoid going long periods of time without  eating to lose weight.  Eating a diet that consists mainly of plant-based foods, such as fruits, vegetables, nuts, legumes, and whole grains.  Using low-heat cooking methods, such as baking, instead of high-heat cooking methods, such as deep frying. Work with your dietitian to make sure you understand how to use the Nutrition Facts information on food labels. How can food affect me? Carbohydrates  Carbohydrates affect your blood glucose level more than any other type of food. Your dietitian will help you determine how many carbohydrates to eat at each meal and teach you how to count carbohydrates. Counting carbohydrates is important to keep your blood glucose at a healthy level, especially if you are using insulin or taking certain medicines for diabetes mellitus. Alcohol  Alcohol can cause sudden decreases in blood glucose (hypoglycemia), especially if you use insulin or take certain medicines for diabetes mellitus. Hypoglycemia can be a life-threatening condition. Symptoms of hypoglycemia (sleepiness, dizziness, and disorientation) are similar to symptoms of having too much alcohol. If your health care provider has given you approval to drink alcohol, do so in moderation and use the following guidelines:  Women should not have more than one drink per day, and men should not have more than two drinks per day. One drink is equal to:  12 oz of beer.  5 oz of wine.  1 oz of hard liquor.  Do not drink on an empty stomach.  Keep yourself hydrated. Have water, diet soda, or unsweetened iced tea.  Regular soda, juice, and other mixers might contain a lot of carbohydrates and should be counted. What foods are not recommended? As you make food choices, it is important to remember that all foods are not the same. Some foods have fewer nutrients per serving than other foods, even though they might have the same number of calories or carbohydrates. It is difficult to get your body what it needs when  you eat foods with fewer nutrients. Examples of foods that you should avoid that are high in calories and carbohydrates but low in nutrients include:  Trans fats (most processed foods list trans fats on the Nutrition Facts label).  Regular soda.  Juice.  Candy.  Sweets, such as cake, pie, doughnuts, and cookies.  Fried foods. What foods can I eat? Eat nutrient-rich foods, which will nourish your body and keep you healthy. The food you should eat also will depend on several factors, including:  The calories you need.  The medicines you take.  Your weight.  Your blood glucose level.  Your blood pressure level.  Your cholesterol level. You should eat a variety of foods, including:  Protein.  Lean cuts of meat.  Proteins low in saturated fats, such as fish, egg whites, and beans. Avoid processed meats.  Fruits and vegetables.  Fruits and vegetables that may help control blood glucose levels, such as apples, mangoes, and yams.  Dairy products.  Choose fat-free or low-fat dairy products, such as milk, yogurt, and cheese.  Grains, bread, pasta, and rice.  Choose whole grain products, such as multigrain bread, whole oats, and brown rice. These foods may help control blood pressure.  Fats.  Foods containing healthful fats, such as nuts, avocado, olive oil, canola oil, and fish. Does everyone with diabetes mellitus have the same meal plan? Because  every person with diabetes mellitus is different, there is not one meal plan that works for everyone. It is very important that you meet with a dietitian who will help you create a meal plan that is just right for you. This information is not intended to replace advice given to you by your health care provider. Make sure you discuss any questions you have with your health care provider. Document Released: 10/26/2004 Document Revised: 07/07/2015 Document Reviewed: 12/26/2012 Elsevier Interactive Patient Education  2017 Elsevier  Inc.  -  Diabetes Mellitus and Exercise Exercising regularly is important for your overall health, especially when you have diabetes (diabetes mellitus). Exercising is not only about losing weight. It has many health benefits, such as increasing muscle strength and bone density and reducing body fat and stress. This leads to improved fitness, flexibility, and endurance, all of which result in better overall health. Exercise has additional benefits for people with diabetes, including:  Reducing appetite.  Helping to lower and control blood glucose.  Lowering blood pressure.  Helping to control amounts of fatty substances (lipids) in the blood, such as cholesterol and triglycerides.  Helping the body to respond better to insulin (improving insulin sensitivity).  Reducing how much insulin the body needs.  Decreasing the risk for heart disease by:  Lowering cholesterol and triglyceride levels.  Increasing the levels of good cholesterol.  Lowering blood glucose levels. What is my activity plan? Your health care provider or certified diabetes educator can help you make a plan for the type and frequency of exercise (activity plan) that works for you. Make sure that you:  Do at least 150 minutes of moderate-intensity or vigorous-intensity exercise each week. This could be brisk walking, biking, or water aerobics.  Do stretching and strength exercises, such as yoga or weightlifting, at least 2 times a week.  Spread out your activity over at least 3 days of the week.  Get some form of physical activity every day.  Do not go more than 2 days in a row without some kind of physical activity.  Avoid being inactive for more than 90 minutes at a time. Take frequent breaks to walk or stretch.  Choose a type of exercise or activity that you enjoy, and set realistic goals.  Start slowly, and gradually increase the intensity of your exercise over time. What do I need to know about managing  my diabetes?  Check your blood glucose before and after exercising.  If your blood glucose is higher than 240 mg/dL (00.5 mmol/L) before you exercise, check your urine for ketones. If you have ketones in your urine, do not exercise until your blood glucose returns to normal.  Know the symptoms of low blood glucose (hypoglycemia) and how to treat it. Your risk for hypoglycemia increases during and after exercise. Common symptoms of hypoglycemia can include:  Hunger.  Anxiety.  Sweating and feeling clammy.  Confusion.  Dizziness or feeling light-headed.  Increased heart rate or palpitations.  Blurry vision.  Tingling or numbness around the mouth, lips, or tongue.  Tremors or shakes.  Irritability.  Keep a rapid-acting carbohydrate snack available before, during, and after exercise to help prevent or treat hypoglycemia.  Avoid injecting insulin into areas of the body that are going to be exercised. For example, avoid injecting insulin into:  The arms, when playing tennis.  The legs, when jogging.  Keep records of your exercise habits. Doing this can help you and your health care provider adjust your diabetes management plan as  needed. Write down:  Food that you eat before and after you exercise.  Blood glucose levels before and after you exercise.  The type and amount of exercise you have done.  When your insulin is expected to peak, if you use insulin. Avoid exercising at times when your insulin is peaking.  When you start a new exercise or activity, work with your health care provider to make sure the activity is safe for you, and to adjust your insulin, medicines, or food intake as needed.  Drink plenty of water while you exercise to prevent dehydration or heat stroke. Drink enough fluid to keep your urine clear or pale yellow. This information is not intended to replace advice given to you by your health care provider. Make sure you discuss any questions you have  with your health care provider. Document Released: 04/21/2003 Document Revised: 08/19/2015 Document Reviewed: 07/11/2015 Elsevier Interactive Patient Education  2017 Elsevier Inc. Pneumococcal Polysaccharide Vaccine: What You Need to Know 1. Why get vaccinated? Vaccination can protect older adults (and some children and younger adults) from pneumococcal disease. Pneumococcal disease is caused by bacteria that can spread from person to person through close contact. It can cause ear infections, and it can also lead to more serious infections of the:  Lungs (pneumonia),  Blood (bacteremia), and  Covering of the brain and spinal cord (meningitis). Meningitis can cause deafness and brain damage, and it can be fatal. Anyone can get pneumococcal disease, but children under 43 years of age, people with certain medical conditions, adults over 48 years of age, and cigarette smokers are at the highest risk. About 18,000 older adults die each year from pneumococcal disease in the [place]. Treatment of pneumococcal infections with penicillin and other drugs used to be more effective. But some strains of the disease have become resistant to these drugs. This makes prevention of the disease, through vaccination, even more important. 2. Pneumococcal polysaccharide vaccine (PPSV23) Pneumococcal polysaccharide vaccine (PPSV23) protects against 23 types of pneumococcal bacteria. It will not prevent all pneumococcal disease. PPSV23 is recommended for:  All adults 62 years of age and older,  Anyone 2 through 49 years of age with certain long-term health problems,  Anyone 2 through 49 years of age with a weakened immune system,  Adults 74 through 49 years of age who smoke cigarettes or have asthma. Most people need only one dose of PPSV. A second dose is recommended for certain high-risk groups. People 91 and older should get a dose even if they have gotten one or more doses of the vaccine before they  turned 65. Your healthcare provider can give you more information about these recommendations. Most healthy adults develop protection within 2 to 3 weeks of getting the shot. 3. Some people should not get this vaccine  Anyone who has had a life-threatening allergic reaction to PPSV should not get another dose.  Anyone who has a severe allergy to any component of PPSV should not receive it. Tell your provider if you have any severe allergies.  Anyone who is moderately or severely ill when the shot is scheduled may be asked to wait until they recover before getting the vaccine. Someone with a mild illness can usually be vaccinated.  Children less than 36 years of age should not receive this vaccine.  There is no evidence that PPSV is harmful to either a pregnant woman or to her fetus. However, as a precaution, women who need the vaccine should be vaccinated before becoming pregnant,  if possible. 4. Risks of a vaccine reaction With any medicine, including vaccines, there is a chance of side effects. These are usually mild and go away on their own, but serious reactions are also possible. About half of people who get PPSV have mild side effects, such as redness or pain where the shot is given, which go away within about two days. Less than 1 out of 100 people develop a fever, muscle aches, or more severe local reactions. Problems that could happen after any vaccine:   People sometimes faint after a medical procedure, including vaccination. Sitting or lying down for about 15 minutes can help prevent fainting, and injuries caused by a fall. Tell your doctor if you feel dizzy, or have vision changes or ringing in the ears.  Some people get severe pain in the shoulder and have difficulty moving the arm where a shot was given. This happens very rarely.  Any medication can cause a severe allergic reaction. Such reactions from a vaccine are very rare, estimated at about 1 in a million doses, and would  happen within a few minutes to a few hours after the vaccination. As with any medicine, there is a very remote chance of a vaccine causing a serious injury or death. The safety of vaccines is always being monitored. For more information, visit: http://floyd.org/ 5. What if there is a serious reaction? What should I look for?  Look for anything that concerns you, such as signs of a severe allergic reaction, very high fever, or unusual behavior. Signs of a severe allergic reaction can include hives, swelling of the face and throat, difficulty breathing, a fast heartbeat, dizziness, and weakness. These would usually start a few minutes to a few hours after the vaccination. What should I do?  If you think it is a severe allergic reaction or other emergency that can't wait, call 9-1-1 or get to the nearest hospital. Otherwise, call your doctor. Afterward, the reaction should be reported to the Vaccine Adverse Event Reporting System (VAERS). Your doctor might file this report, or you can do it yourself through the VAERS web site at www.vaers.LAgents.no, or by calling 1-(970) 509-1722. VAERS does not give medical advice.  6. How can I learn more?  Ask your doctor. He or she can give you the vaccine package insert or suggest other sources of information.  Call your local or state health department.  Contact the Centers for Disease Control and Prevention (CDC):  Call 4384737838 (1-800-CDC-INFO) or  Visit CDC's website at PicCapture.uy CDC Pneumococcal Polysaccharide Vaccine VIS (06/05/13) This information is not intended to replace advice given to you by your health care provider. Make sure you discuss any questions you have with your health care provider. Document Released: 11/26/2005 Document Revised: 10/20/2015 Document Reviewed: 10/20/2015 Elsevier Interactive Patient Education  2017 [employer].

## 2016-06-21 LAB — MICROALBUMIN / CREATININE URINE RATIO
Creatinine, Urine: 337.7 mg/dL
MICROALB/CREAT RATIO: 24 mg/g{creat} (ref 0.0–30.0)
Microalbumin, Urine: 81.2 ug/mL

## 2016-06-29 ENCOUNTER — Ambulatory Visit: Payer: Self-pay | Attending: Internal Medicine

## 2016-06-29 LAB — BASIC METABOLIC PANEL
BUN: 10 mg/dL (ref 7–25)
CHLORIDE: 102 mmol/L (ref 98–110)
CO2: 24 mmol/L (ref 20–31)
Calcium: 9 mg/dL (ref 8.6–10.2)
Creat: 0.92 mg/dL (ref 0.50–1.10)
Glucose, Bld: 225 mg/dL — ABNORMAL HIGH (ref 65–99)
POTASSIUM: 4.3 mmol/L (ref 3.5–5.3)
Sodium: 136 mmol/L (ref 135–146)

## 2016-07-03 ENCOUNTER — Ambulatory Visit: Payer: Medicaid Other | Attending: Internal Medicine | Admitting: Pharmacist

## 2016-07-03 DIAGNOSIS — E119 Type 2 diabetes mellitus without complications: Secondary | ICD-10-CM

## 2016-07-03 DIAGNOSIS — E1165 Type 2 diabetes mellitus with hyperglycemia: Secondary | ICD-10-CM | POA: Diagnosis not present

## 2016-07-03 MED ORDER — TRUEPLUS LANCETS 28G MISC: 12 refills | Status: DC

## 2016-07-03 MED ORDER — TRUE METRIX METER W/DEVICE KIT: PACK | 0 refills | Status: DC

## 2016-07-03 MED ORDER — METFORMIN HCL ER 500 MG PO TB24: 1000.0000 mg | ORAL_TABLET | Freq: Two times a day (BID) | ORAL | 2 refills | Status: DC

## 2016-07-03 MED ORDER — INSULIN GLARGINE 100 UNIT/ML SOLOSTAR PEN: 10.0000 [IU] | PEN_INJECTOR | Freq: Every day | SUBCUTANEOUS | 0 refills | Status: DC

## 2016-07-03 MED ORDER — GLUCOSE BLOOD VI STRP: ORAL_STRIP | 12 refills | Status: DC

## 2016-07-03 MED ORDER — INSULIN PEN NEEDLE 31G X 5 MM MISC
1 refills | Status: DC
Start: 2016-07-03 — End: 2016-08-28

## 2016-07-03 MED FILL — !TRUE METRIX BLOOD GLUCOSE: 1 days supply | Qty: 1 | Fill #0

## 2016-07-03 MED FILL — TRUEplus LANCETS 28G MISC: 25 days supply | Qty: 100 | Fill #0

## 2016-07-03 MED FILL — TRUEPLUS SYR 0.5ML 31GX5/16: 31G X 5/16" | 25 days supply | Qty: 100 | Fill #0

## 2016-07-03 MED FILL — TRUE METRIX TEST STRIP: 25 days supply | Qty: 100 | Fill #0

## 2016-07-03 MED FILL — METFORMIN HCL ER 500 MG TAB: 500 | 30 days supply | Qty: 120 | Fill #0

## 2016-07-03 MED FILL — !LANTUS SOLOSTAR 100UNITS/M: 100 | 30 days supply | Qty: 3 | Fill #0

## 2016-07-03 NOTE — Patient Instructions (Addendum)
Thanks for coming to see [name]!  Stop taking the 70/30 insulin  Start Lantus 10 units daily  Please start to check your blood sugars 2 hours after you eat. This will help [name] determine the best dose for your insulin.  Follow up with Dr. Wynetta Emery in 2 weeks.  Please make an appointment with Financial Counseling   Hypoglycemia  Hypoglycemia is when the sugar (glucose) level in the blood is too low. Symptoms of low blood sugar may include:  Feeling:  Hungry.  Worried or nervous (anxious).  Sweaty and clammy.  Confused.  Dizzy.  Sleepy.  Sick to your stomach (nauseous).  Having:  A fast heartbeat.  A headache.  A change in your vision.  Jerky movements that you cannot control (seizure).  Nightmares.  Tingling or no feeling (numbness) around the mouth, lips, or tongue.  Having trouble with:  Talking.  Paying attention (concentrating).  Moving (coordination).  Sleeping.  Shaking.  Passing out (fainting).  Getting upset easily (irritability). Low blood sugar can happen to people who have diabetes and people who do not have diabetes. Low blood sugar can happen quickly, and it can be an emergency. Treating Low Blood Sugar  Low blood sugar is often treated by eating or drinking something sugary right away. If you can think clearly and swallow safely, follow the 15:15 rule:  Take 15 grams of a fast-acting carb (carbohydrate). Some fast-acting carbs are:  1 tube of glucose gel.  3 sugar tablets (glucose pills).  6-8 pieces of hard candy.  4 oz (120 mL) of fruit juice.  4 oz (120 mL) of regular (not diet) soda.  Check your blood sugar 15 minutes after you take the carb.  If your blood sugar is still at or below 70 mg/dL (3.9 mmol/L), take 15 grams of a carb again.  If your blood sugar does not go above 70 mg/dL (3.9 mmol/L) after 3 tries, get help right away.  After your blood sugar goes back to normal, eat a meal or a snack within 1 hour. Treating  Very Low Blood Sugar  If your blood sugar is at or below 54 mg/dL (3 mmol/L), you have very low blood sugar (severe hypoglycemia). This is an emergency. Do not wait to see if the symptoms will go away. Get medical help right away. Call your local emergency services (911 in the [place] Do not drive yourself to the hospital. If you have very low blood sugar and you cannot eat or drink, you may need a glucagon shot (injection). A family member or friend should learn how to check your blood sugar and how to give you a glucagon shot. Ask your doctor if you need to have a glucagon shot kit at home. Follow these instructions at home: General instructions   Avoid any diets that cause you to not eat enough food. Talk with your doctor before you start any new diet.  Take over-the-counter and prescription medicines only as told by your doctor.  Limit alcohol to no more than 1 drink per day for nonpregnant women and 2 drinks per day for men. One drink equals 12 oz of beer, 5 oz of wine, or 1 oz of hard liquor.  Keep all follow-up visits as told by your doctor. This is important. If You Have Diabetes:    Make sure you know the symptoms of low blood sugar.  Always keep a source of sugar with you, such as:  Sugar.  Sugar tablets.  Glucose gel.  Fruit  juice.  Regular soda (not diet soda).  Milk.  Hard candy.  Honey.  Take your medicines as told.  Follow your exercise and meal plan.  Eat on time. Do not skip meals.  Follow your sick day plan when you cannot eat or drink normally. Make this plan ahead of time with your doctor.  Check your blood sugar as often as told by your doctor. Always check before and after exercise.  Share your diabetes care plan with:  Your work or school.  People you live with.  Check your pee (urine) for ketones:  When you are sick.  As told by your doctor.  Carry a card or wear jewelry that says you have diabetes. If You Have Low Blood Sugar From  Other Causes:    Check your blood sugar as often as told by your doctor.  Follow instructions from your doctor about what you cannot eat or drink. Contact a doctor if:  You have trouble keeping your blood sugar in your target range.  You have low blood sugar often. Get help right away if:  You still have symptoms after you eat or drink something sugary.  Your blood sugar is at or below 54 mg/dL (3 mmol/L).  You have jerky movements that you cannot control.  You pass out. These symptoms may be an emergency. Do not wait to see if the symptoms will go away. Get medical help right away. Call your local emergency services (911 in the [place] Do not drive yourself to the hospital. This information is not intended to replace advice given to you by your health care provider. Make sure you discuss any questions you have with your health care provider. Document Released: 04/25/2009 Document Revised: 07/07/2015 Document Reviewed: 03/04/2015 Elsevier Interactive Patient Education  2017 [employer].

## 2016-07-03 NOTE — Progress Notes (Signed)
    S:     No chief complaint on file.   Patient arrives in good spirits.  Presents for diabetes evaluation, education, and management at the request of Dr. Julien Nordmann. Patient was referred on 06/20/16.  Patient was last seen by Primary Care Provider on 06/20/16.   Patient reports adherence with medications.  Current diabetes medications include: 70/30 17 units in the morning and 8 units in the evening  Patient denies hypoglycemic events.  Patient reported dietary habits: she tries to eat 3 meals a day but is very busy.    Patient denies nocturia.  Patient denies neuropathy. Patient denies visual changes. Patient reports self foot exams.    O:  Physical Exam   ROS   Lab Results  Component Value Date   HGBA1C 12.3 (H) 06/10/2016   There were no vitals filed for this visit.  Home fasting CBG:180s 2 hour post-prandial/random CBG: none.   A/P: Diabetes longstanding currently uncontrolled based on A1c of 12.3 but appears to be better controlled based on home fastings. Patient denies hypoglycemic events and is able to verbalize appropriate hypoglycemia management plan. Patient reports adherence with medication. Control is suboptimal due to difficulty finding time to check her blood sugar.  Patient is unable to check blood sugar during the day due to schedule. Concerned about increasing 70/30 when she is unable to check her sugars due to risk of hypoglycemia. Will stop 70/30 and start Lantus 10 units daily. Patient submitted paperwork for financial assistance today so should be able to get Lantus through our pharmacy. Patient to take the Lantus in the morning for now. Educated was provided on [employer] and patient was able to demonstrate use. Also ordered new blood glucose monitoring supplies as patient was having a hard time affording them from [place].  Next A1C anticipated July 2018.    Written patient instructions provided.  Total time in face to face counseling 25  minutes.   Follow up in Pharmacist Clinic Visit PRN, next visit to establish care with Dr. Laural Benes.   Patient seen with Susy Frizzle, PharmD Candidate

## 2016-07-16 ENCOUNTER — Ambulatory Visit: Payer: Medicaid Other | Attending: Internal Medicine | Admitting: Internal Medicine

## 2016-07-16 ENCOUNTER — Encounter: Payer: Self-pay | Admitting: Internal Medicine

## 2016-07-16 VITALS — BP 113/81 | HR 89 | Temp 98.3°F | Resp 16 | Wt 201.2 lb

## 2016-07-16 DIAGNOSIS — E1165 Type 2 diabetes mellitus with hyperglycemia: Secondary | ICD-10-CM | POA: Diagnosis present

## 2016-07-16 DIAGNOSIS — I420 Dilated cardiomyopathy: Secondary | ICD-10-CM

## 2016-07-16 DIAGNOSIS — Z794 Long term (current) use of insulin: Secondary | ICD-10-CM | POA: Diagnosis not present

## 2016-07-16 DIAGNOSIS — Z01419 Encounter for gynecological examination (general) (routine) without abnormal findings: Secondary | ICD-10-CM | POA: Diagnosis present

## 2016-07-16 DIAGNOSIS — Z1231 Encounter for screening mammogram for malignant neoplasm of breast: Secondary | ICD-10-CM

## 2016-07-16 DIAGNOSIS — E785 Hyperlipidemia, unspecified: Secondary | ICD-10-CM | POA: Insufficient documentation

## 2016-07-16 DIAGNOSIS — Z803 Family history of malignant neoplasm of breast: Secondary | ICD-10-CM | POA: Diagnosis not present

## 2016-07-16 DIAGNOSIS — I110 Hypertensive heart disease with heart failure: Secondary | ICD-10-CM | POA: Diagnosis not present

## 2016-07-16 DIAGNOSIS — IMO0001 Reserved for inherently not codable concepts without codable children: Secondary | ICD-10-CM | POA: Insufficient documentation

## 2016-07-16 DIAGNOSIS — Z7982 Long term (current) use of aspirin: Secondary | ICD-10-CM | POA: Diagnosis not present

## 2016-07-16 DIAGNOSIS — I5043 Acute on chronic combined systolic (congestive) and diastolic (congestive) heart failure: Secondary | ICD-10-CM | POA: Diagnosis not present

## 2016-07-16 DIAGNOSIS — Z1239 Encounter for other screening for malignant neoplasm of breast: Secondary | ICD-10-CM

## 2016-07-16 DIAGNOSIS — R918 Other nonspecific abnormal finding of lung field: Secondary | ICD-10-CM | POA: Insufficient documentation

## 2016-07-16 LAB — GLUCOSE, POCT (MANUAL RESULT ENTRY): POC Glucose: 244 mg/dl — AB (ref 70–99)

## 2016-07-16 NOTE — Progress Notes (Signed)
Patient ID: [name], female    DOB: 1966/02/07  MRN: [id number]  CC: Establish Care; Diabetes; Congestive Heart Failure; and Hypertension   Subjective: [name] is a 51 y.o. female who presents for f/u DM and to get pap. Her concerns today include:  1.  PAP:  -hx of abnormal paps in past in late 2000s requiring cryotherapy. -no abnormal dischg or itching at this time. + vaginal dryness -no STI in past -last menses was 1 wk ago; regular, last 3 days. Moderate bleeding; a lot of cramps -sexually active with one female partner, no BC. -last MMG was 2009.  Mother had breast CA  2.  DM:  -taking Metformin but did not start Lantus because she had problems assembling the pen.  Taking Novolin 70/30 18 units once a day until she could get in today   3.  DCM/CHF -compliant with meds and salt restriction.  Told to take Furosemide BID but taking only once a day because 2nd dose keeps her going to bathroom all night -no SOB/CP/LE edema. Wgh self regularly  Patient Active Problem List   Diagnosis Date Noted  . DCM (dilated cardiomyopathy) ([place])   . Acute on chronic combined systolic and diastolic CHF (congestive heart failure) ([place]) 06/10/2016  . Benign essential HTN 06/10/2016  . Dyslipidemia associated with type 2 diabetes mellitus ([place]) 06/10/2016  . Bilateral pleural effusion 06/10/2016  . Troponin level elevated 06/10/2016     Current Outpatient Prescriptions on File Prior to Visit  Medication Sig Dispense Refill  . albuterol (PROVENTIL HFA;VENTOLIN HFA) 108 (90 Base) MCG/ACT inhaler Inhale 1 puff into the lungs every 6 (six) hours as needed for wheezing or shortness of breath. 1 Inhaler 0  . aspirin 81 MG chewable tablet Chew 1 tablet (81 mg total) by mouth daily. 30 tablet 0  . atorvastatin (LIPITOR) 80 MG tablet Take 1 tablet (80 mg total) by mouth daily at 6 PM. 30 tablet 0  . blood glucose meter kit and supplies Dispense based on patient and insurance preference. Use  up to four times daily as directed. (FOR ICD-9 250.00, 250.01). 1 each 0  . Blood Glucose Monitoring Suppl (TRUE METRIX METER) w/Device KIT Use as directed 1 kit 0  . carvedilol (COREG) 6.25 MG tablet Take 1 tablet (6.25 mg total) by mouth 2 (two) times daily with a meal. 60 tablet 0  . furosemide (LASIX) 40 MG tablet Take 1 tablet (40 mg total) by mouth 2 (two) times daily. 60 tablet 0  . glucose blood (TRUE METRIX BLOOD GLUCOSE TEST) test strip Use as instructed 100 each 12  . hydrALAZINE (APRESOLINE) 10 MG tablet Take 1 tablet (10 mg total) by mouth 3 (three) times daily. 90 tablet 0  . Insulin Glargine (LANTUS SOLOSTAR) 100 UNIT/ML Solostar Pen Inject 10 Units into the skin daily. 5 pen 0  . Insulin Pen Needle 31G X 5 MM MISC Use as directed 100 each 1  . isosorbide mononitrate (IMDUR) 30 MG 24 hr tablet Take 0.5 tablets (15 mg total) by mouth daily. 30 tablet 0  . metFORMIN (GLUCOPHAGE XR) 500 MG 24 hr tablet Take 2 tablets (1,000 mg total) by mouth 2 (two) times daily with a meal. 120 tablet 2  . potassium chloride (K-DUR,KLOR-CON) 10 MEQ tablet Take 1 tablet (10 mEq total) by mouth 2 (two) times daily. 60 tablet 0  . spironolactone (ALDACTONE) 25 MG tablet Take 1 tablet (25 mg total) by mouth daily. 90 tablet 3  .  TRUEPLUS LANCETS 28G MISC Use as directed 100 each 12  . [DISCONTINUED] lisinopril (PRINIVIL,ZESTRIL) 40 MG tablet Take 1 tablet (40 mg total) by mouth daily. (Patient not taking: Reported on 07/30/2015) 30 tablet 11  . [DISCONTINUED] sertraline (ZOLOFT) 50 MG tablet Take 1 tablet (50 mg total) by mouth daily. (Patient not taking: Reported on 07/30/2015) 30 tablet 3   No current facility-administered medications on file prior to visit.     Allergies  Allergen Reactions  . Lisinopril Hives and Swelling    Facial   . Sertraline Hives and Swelling    Facial   . Tramadol Hives and Swelling    facial  . Ibuprofen Hives and Swelling    Social History   Social History  .  Marital status: Single    Spouse name: N/A  . Number of children: 2  . Years of education: N/A   Occupational History  . Not on file.   Social History Main Topics  . Smoking status: Never Smoker  . Smokeless tobacco: Never Used  . Alcohol use No  . Drug use: No  . Sexual activity: Yes    Birth control/ protection: None   Other Topics Concern  . Not on file   Social History Narrative   Lives in [place].  Presently unemployed but attends school full time    Family History  Problem Relation Age of Onset  . Diabetes Unknown        multiple  . Heart failure Paternal Grandmother   . Breast cancer Paternal Grandmother   . Heart disease Unknown        multiple  . Breast cancer Mother     Past Surgical History:  Procedure Laterality Date  . CERVIX LESION DESTRUCTION    . DILATION AND CURETTAGE OF UTERUS    . LEFT HEART CATHETERIZATION WITH CORONARY ANGIOGRAM N/A 05/21/2011   Procedure: LEFT HEART CATHETERIZATION WITH CORONARY ANGIOGRAM;  Surgeon: Sherren Mocha, MD;  Location: [facility] CATH LAB;  Service: Cardiovascular;  Laterality: N/A;  . TUBAL LIGATION  1996    ROS: Review of Systems -as stated above  PHYSICAL EXAM: BP 113/81   Pulse 89   Temp 98.3 F (36.8 C) (Oral)   Resp 16   Wt 201 lb 3.2 oz (91.3 kg)   SpO2 98%   BMI 29.71 kg/m   Wt Readings from Last 3 Encounters:  07/16/16 201 lb 3.2 oz (91.3 kg)  06/20/16 198 lb 6.4 oz (90 kg)  06/19/16 196 lb (88.9 kg)   Physical Exam  General appearance - alert, well appearing, and in no distress Mental status - alert, oriented to person, place, and time, normal mood, behavior, speech, dress, motor activity, and thought processes Mouth - mucous membranes moist, pharynx normal without lesions Neck - supple, no thyroid enlargement Lymphatics -no cervical LN Chest - clear to auscultation, no wheezes, rales or rhonchi, symmetric air entry Heart - normal rate, regular rhythm, normal S1, S2, no murmurs, rubs, clicks or  gallops Breasts - breasts appear normal, no suspicious masses, no skin or nipple changes or axillary nodes Extremities - peripheral pulses normal, no pedal edema, no clubbing or cyanosis GU:  [place], CMA present:  No external lesions, small amount of thin white fishy smelling dischg in vagina and around cervix.  No CMT or adnexal masses.  Results for orders placed or performed in visit on 07/16/16  POCT glucose (manual entry)  Result Value Ref Range   POC Glucose 244 (A) 70 - 99  mg/dl   ASSESSMENT AND PLAN: 1. Encounter for gynecological examination - Cytology - PAP Pt declined GC/chlamydia check but was agreeable to wet prep 2. Breast cancer screening -MMG referral  3. Uncontrolled type 2 diabetes mellitus with hyperglycemia, without long-term current use of insulin ([place]) -pt to see clin. Phar today for instructions on how to use insulin pen.  She will then start Lantus as intended - POCT glucose (manual entry) - Ambulatory referral to Ophthalmology - BUN+Creat; Future  4. DCM (dilated cardiomyopathy) ([place]) -referral to ophth  5. Lung nodules -needs repeat CT scan the end of July.   - CT Chest W Contrast; Future   Patient was given the opportunity to ask questions.  Patient verbalized understanding of the plan and was able to repeat key elements of the plan.   Orders Placed This Encounter  Procedures  . CT Chest W Contrast  . MM Digital Screening  . BUN+Creat  . Ambulatory referral to Ophthalmology  . POCT glucose (manual entry)     Requested Prescriptions    No prescriptions requested or ordered in this encounter    Return in about 2 months (around 09/15/2016).  Karle Plumber, MD, FACP

## 2016-07-16 NOTE — Patient Instructions (Addendum)
You have been referred for mammogram.  Use the Furosemide twice a day if you notice swelling in the legs or shortness of breath.  You will need to have repeat CT scan of chest end of July.

## 2016-07-17 LAB — CERVICOVAGINAL ANCILLARY ONLY: Wet Prep (BD Affirm): POSITIVE — AB

## 2016-07-18 ENCOUNTER — Other Ambulatory Visit: Payer: Self-pay | Admitting: Internal Medicine

## 2016-07-18 ENCOUNTER — Telehealth: Payer: Self-pay

## 2016-07-18 MED ORDER — METRONIDAZOLE 500 MG PO TABS: 500.0000 mg | ORAL_TABLET | Freq: Two times a day (BID) | ORAL | 0 refills | Status: DC

## 2016-07-18 MED FILL — metroNIDAZOLE 500 MG TABS: 500 | 7 days supply | Qty: 14 | Fill #0

## 2016-07-18 NOTE — Telephone Encounter (Signed)
Contacted pt to go over lab results pt didn't answer lvm asking pt to give me a call at her earliest convenience   If pt calls back please give results: vaginal smear positive for infection called Bacterial Vaginosis. Not a sexually transmitted infection but rather due to overgrowth of normal vaginal bacteria. Show avoid douching. Prescription sent to [facility] pharmacy for antibiotic called Flagyl to pick up.

## 2016-07-18 NOTE — Progress Notes (Signed)
Rx sent to pharmacy for Flagyl.  Test + for BV.

## 2016-07-19 ENCOUNTER — Telehealth: Payer: Self-pay | Admitting: Internal Medicine

## 2016-07-19 LAB — CYTOLOGY - PAP
Diagnosis: NEGATIVE
HPV: NOT DETECTED

## 2016-07-19 NOTE — Telephone Encounter (Signed)
Pt. Returned nurse call and was informed of pap results and Rx sent to pharmacy.

## 2016-07-20 MED FILL — SPIRONOLACTONE 25 MG TABLET: 25 | 30 days supply | Qty: 30 | Fill #1

## 2016-07-23 ENCOUNTER — Telehealth: Payer: Self-pay | Admitting: Internal Medicine

## 2016-07-23 NOTE — Telephone Encounter (Signed)
Lawana Pai is the CMA for [place], please forward all calls and medication request to her.

## 2016-07-23 NOTE — Telephone Encounter (Signed)
PT called requesting to speak with you since all these med. Has NO refill potassium chloride (K-DUR,KLOR-CON) 10 MEQ tablet  furosemide (LASIX) 40 MG tablet  hydrALAZINE (APRESOLINE) 10 MG tablet isosorbide mononitrate (IMDUR) 30 MG 24 hr tablet  atorvastatin (LIPITOR) 80 MG tablet carvedilol (COREG) 6.25 MG tablet   She need to know, if she need to stop taking or your need to sent a refill since she only has med for today only, please f/u with PT

## 2016-07-26 ENCOUNTER — Other Ambulatory Visit: Payer: Self-pay | Admitting: Internal Medicine

## 2016-07-26 DIAGNOSIS — I10 Essential (primary) hypertension: Secondary | ICD-10-CM

## 2016-07-26 DIAGNOSIS — I5022 Chronic systolic (congestive) heart failure: Secondary | ICD-10-CM

## 2016-07-26 DIAGNOSIS — E785 Hyperlipidemia, unspecified: Secondary | ICD-10-CM

## 2016-07-26 DIAGNOSIS — I5021 Acute systolic (congestive) heart failure: Secondary | ICD-10-CM

## 2016-07-26 DIAGNOSIS — I428 Other cardiomyopathies: Secondary | ICD-10-CM

## 2016-07-26 MED ORDER — SPIRONOLACTONE 25 MG PO TABS: 25.0000 mg | ORAL_TABLET | Freq: Every day | ORAL | 3 refills | Status: DC

## 2016-07-26 MED ORDER — ATORVASTATIN CALCIUM 80 MG PO TABS: 80.0000 mg | ORAL_TABLET | Freq: Every day | ORAL | 11 refills | Status: DC

## 2016-07-26 MED ORDER — FUROSEMIDE 40 MG PO TABS: 40.0000 mg | ORAL_TABLET | Freq: Two times a day (BID) | ORAL | 11 refills | Status: DC

## 2016-07-26 MED ORDER — HYDRALAZINE HCL 10 MG PO TABS: 10.0000 mg | ORAL_TABLET | Freq: Three times a day (TID) | ORAL | 11 refills | Status: DC

## 2016-07-26 MED ORDER — CARVEDILOL 6.25 MG PO TABS: 6.2500 mg | ORAL_TABLET | Freq: Two times a day (BID) | ORAL | 11 refills | Status: DC

## 2016-07-26 MED ORDER — POTASSIUM CHLORIDE CRYS ER 10 MEQ PO TBCR: 10.0000 meq | EXTENDED_RELEASE_TABLET | Freq: Once | ORAL | 6 refills | Status: DC

## 2016-07-26 MED ORDER — ISOSORBIDE MONONITRATE ER 30 MG PO TB24: 15.0000 mg | ORAL_TABLET | Freq: Every day | ORAL | 11 refills | Status: DC

## 2016-07-26 MED ORDER — METFORMIN HCL ER 500 MG PO TB24: 1000.0000 mg | ORAL_TABLET | Freq: Two times a day (BID) | ORAL | 2 refills | Status: DC

## 2016-07-26 MED FILL — METFORMIN HCL ER 500 MG TAB: 500 | 30 days supply | Qty: 120 | Fill #0

## 2016-07-26 MED FILL — CARVEDILOL 6.25 MG TABLET: 6.25 | 30 days supply | Qty: 60 | Fill #0

## 2016-07-26 MED FILL — ATORVASTATIN 80 MG TABLET: 80 | 30 days supply | Qty: 30 | Fill #0

## 2016-07-26 MED FILL — hydrALAZINE HCL 10 MG TABS: 10 | 30 days supply | Qty: 90 | Fill #0

## 2016-07-26 MED FILL — ISOSORBIDE MN ER 30 MG TAB: 30 | 30 days supply | Qty: 15 | Fill #0

## 2016-07-26 MED FILL — SPIRONOLACTONE 25 MG TABLET: 25 | 30 days supply | Qty: 30 | Fill #0

## 2016-07-26 MED FILL — FUROSEMIDE 40 MG TABLET: 40 | 30 days supply | Qty: 60 | Fill #0

## 2016-07-26 MED FILL — POTASSIUM CL 10 MEQ TAB SA: 10 | 30 days supply | Qty: 30 | Fill #0

## 2016-07-26 NOTE — Progress Notes (Signed)
RF requested meds. Potassium decreased to 10 meq once a day.

## 2016-08-20 ENCOUNTER — Other Ambulatory Visit: Payer: Self-pay | Admitting: *Deleted

## 2016-08-20 MED ORDER — INSULIN ISOPHANE & REGULAR (HUMAN 70-30)100 UNIT/ML KWIKPEN: PEN_INJECTOR | SUBCUTANEOUS | 3 refills | Status: DC

## 2016-08-20 MED ORDER — ALBUTEROL SULFATE HFA 108 (90 BASE) MCG/ACT IN AERS: 1.0000 | INHALATION_SPRAY | Freq: Four times a day (QID) | RESPIRATORY_TRACT | 3 refills | Status: DC | PRN

## 2016-08-20 NOTE — Telephone Encounter (Signed)
PRINTED FOR PASS PROGRAM 

## 2016-08-23 MED FILL — ?SPIRONOLACTONE 25 MG TABLE: 25 | 30 days supply | Qty: 30 | Fill #2

## 2016-08-23 MED FILL — ?CARVEDILOL 6.25 MG TABLET: 6.25 | 30 days supply | Qty: 60 | Fill #1

## 2016-08-23 MED FILL — ISOSORBIDE MN ER 30 MG TAB: 30 | 30 days supply | Qty: 15 | Fill #1

## 2016-08-28 ENCOUNTER — Telehealth: Payer: Self-pay | Admitting: Internal Medicine

## 2016-08-28 MED ORDER — INSULIN PEN NEEDLE 31G X 5 MM MISC: 1 refills | Status: DC

## 2016-08-28 MED FILL — TRUEPLUS PEN NDL 31G X 1/4": 31G X 6 MM | 25 days supply | Qty: 100 | Fill #0

## 2016-08-28 MED FILL — TRUEPLUS PEN NDL 31G X 1/4: 31G X 6 MM | 25 days supply | Qty: 100 | Fill #0

## 2016-08-28 NOTE — Telephone Encounter (Signed)
Pt called to request a refill for Insulin Pen Needle 31G X 5 MM MISC ??  For pen needle for her insulin, Pt will be here on 7.20.18 to pick up the med and the needle, please follow up

## 2016-08-28 NOTE — Telephone Encounter (Signed)
Insulin pen needles refilled 

## 2016-08-29 MED FILL — LANTUS SOLOSTAR 100 UNITS/M: 100 | 30 days supply | Qty: 3 | Fill #1

## 2016-09-10 ENCOUNTER — Ambulatory Visit (HOSPITAL_COMMUNITY): Admission: RE | Admit: 2016-09-10 | Payer: Self-pay | Source: Ambulatory Visit

## 2016-09-25 ENCOUNTER — Ambulatory Visit: Payer: Medicaid Other | Attending: Internal Medicine | Admitting: Internal Medicine

## 2016-09-25 ENCOUNTER — Encounter: Payer: Self-pay | Admitting: Internal Medicine

## 2016-09-25 VITALS — BP 124/82 | HR 92 | Temp 98.2°F | Resp 16 | Wt 211.6 lb

## 2016-09-25 DIAGNOSIS — IMO0001 Reserved for inherently not codable concepts without codable children: Secondary | ICD-10-CM

## 2016-09-25 DIAGNOSIS — R918 Other nonspecific abnormal finding of lung field: Secondary | ICD-10-CM | POA: Diagnosis not present

## 2016-09-25 DIAGNOSIS — I110 Hypertensive heart disease with heart failure: Secondary | ICD-10-CM | POA: Insufficient documentation

## 2016-09-25 DIAGNOSIS — Z794 Long term (current) use of insulin: Secondary | ICD-10-CM | POA: Insufficient documentation

## 2016-09-25 DIAGNOSIS — I420 Dilated cardiomyopathy: Secondary | ICD-10-CM | POA: Diagnosis not present

## 2016-09-25 DIAGNOSIS — E785 Hyperlipidemia, unspecified: Secondary | ICD-10-CM | POA: Insufficient documentation

## 2016-09-25 DIAGNOSIS — E119 Type 2 diabetes mellitus without complications: Secondary | ICD-10-CM | POA: Insufficient documentation

## 2016-09-25 DIAGNOSIS — R51 Headache: Secondary | ICD-10-CM | POA: Insufficient documentation

## 2016-09-25 DIAGNOSIS — I5043 Acute on chronic combined systolic (congestive) and diastolic (congestive) heart failure: Secondary | ICD-10-CM | POA: Insufficient documentation

## 2016-09-25 DIAGNOSIS — M7989 Other specified soft tissue disorders: Secondary | ICD-10-CM | POA: Insufficient documentation

## 2016-09-25 DIAGNOSIS — Z7982 Long term (current) use of aspirin: Secondary | ICD-10-CM | POA: Insufficient documentation

## 2016-09-25 DIAGNOSIS — M25472 Effusion, left ankle: Secondary | ICD-10-CM

## 2016-09-25 DIAGNOSIS — R748 Abnormal levels of other serum enzymes: Secondary | ICD-10-CM | POA: Insufficient documentation

## 2016-09-25 DIAGNOSIS — E1165 Type 2 diabetes mellitus with hyperglycemia: Secondary | ICD-10-CM

## 2016-09-25 LAB — GLUCOSE, POCT (MANUAL RESULT ENTRY): POC Glucose: 319 mg/dl — AB (ref 70–99)

## 2016-09-25 LAB — POCT GLYCOSYLATED HEMOGLOBIN (HGB A1C): Hemoglobin A1C: 12

## 2016-09-25 MED ORDER — INSULIN GLARGINE 100 UNIT/ML SOLOSTAR PEN: 15.0000 [IU] | PEN_INJECTOR | Freq: Every day | SUBCUTANEOUS | 5 refills | Status: DC

## 2016-09-25 NOTE — Patient Instructions (Signed)
Take 10 units of Lantus tonight after dinner.  Then start taking Lantus 15 units after dinner every evening.   Check your blood sugars every morning.  After 3 days, if morning blood sugars are still higher than 140, then increase Lantus from 15 to 20 units daily.

## 2016-09-25 NOTE — Progress Notes (Signed)
Pt thinks she is not getting enough insulin Pt states the novolog she is taking 10 units before she was taking 18 units during the day and 8 at night Pt states she just tried to switch by taking it in the miday Pt states her readings has been high

## 2016-09-25 NOTE — Progress Notes (Signed)
Patient ID: [name], female    DOB: [birth year]  MRN: [id number]  CC: Headache   Subjective: [name] is a 51 y.o. female who presents for f/u DM Her concerns today include:  DM. DCM/CHF with EF25% on 05/2016, HL, lung nodules, HTN, hx of carotid dissection  1.  DM: BS have been high 250 range. Checks once a day in mornings. Compliant with Lantus and Metformin.  Took Lantus 10 units at noon today -endorses blurry vision, urinates a lot due to being on Lasix.  Denies polydipsia.  -doing ok with eating habits -walking daily for exercise. Going up 3 flights of steps to get to her apartment and walks in a nearby part.  NO CP/SOB. LT ankle swells. Had sprained it yrs ago.   2. Lung nodules seen on CT angio end of April.  Needs f/u imaging 3-6 mths from that date Never smoke No chronic cough  Patient Active Problem List   Diagnosis Date Noted  . Lung nodules 07/16/2016  . Uncontrolled type 2 diabetes mellitus without complication, with long-term current use of insulin ([place]) 07/16/2016  . DCM (dilated cardiomyopathy) ([place])   . Acute on chronic combined systolic and diastolic CHF (congestive heart failure) ([place]) 06/10/2016  . Benign essential HTN 06/10/2016  . Dyslipidemia associated with type 2 diabetes mellitus ([place]) 06/10/2016  . Bilateral pleural effusion 06/10/2016  . Troponin level elevated 06/10/2016     Current Outpatient Prescriptions on File Prior to Visit  Medication Sig Dispense Refill  . albuterol (PROVENTIL HFA;VENTOLIN HFA) 108 (90 Base) MCG/ACT inhaler Inhale 1 puff into the lungs every 6 (six) hours as needed for wheezing or shortness of breath. 54 g 3  . aspirin 81 MG chewable tablet Chew 1 tablet (81 mg total) by mouth daily. 30 tablet 0  . atorvastatin (LIPITOR) 80 MG tablet Take 1 tablet (80 mg total) by mouth daily at 6 PM. 30 tablet 11  . blood glucose meter kit and supplies Dispense based on patient and insurance preference. Use up to four times  daily as directed. (FOR ICD-9 250.00, 250.01). 1 each 0  . Blood Glucose Monitoring Suppl (TRUE METRIX METER) w/Device KIT Use as directed 1 kit 0  . carvedilol (COREG) 6.25 MG tablet Take 1 tablet (6.25 mg total) by mouth 2 (two) times daily with a meal. 60 tablet 11  . furosemide (LASIX) 40 MG tablet Take 1 tablet (40 mg total) by mouth 2 (two) times daily. 60 tablet 11  . glucose blood (TRUE METRIX BLOOD GLUCOSE TEST) test strip Use as instructed 100 each 12  . hydrALAZINE (APRESOLINE) 10 MG tablet Take 1 tablet (10 mg total) by mouth 3 (three) times daily. 90 tablet 11  . Insulin Pen Needle 31G X 5 MM MISC Use as directed 100 each 1  . isosorbide mononitrate (IMDUR) 30 MG 24 hr tablet Take 0.5 tablets (15 mg total) by mouth daily. 30 tablet 11  . metFORMIN (GLUCOPHAGE XR) 500 MG 24 hr tablet Take 2 tablets (1,000 mg total) by mouth 2 (two) times daily with a meal. 120 tablet 2  . metroNIDAZOLE (FLAGYL) 500 MG tablet Take 1 tablet (500 mg total) by mouth 2 (two) times daily. (Patient not taking: Reported on 09/25/2016) 14 tablet 0  . potassium chloride (K-DUR,KLOR-CON) 10 MEQ tablet Take 1 tablet (10 mEq total) by mouth once. 30 tablet 6  . spironolactone (ALDACTONE) 25 MG tablet Take 1 tablet (25 mg total) by mouth daily. 90 tablet 3  .  TRUEPLUS LANCETS 28G MISC Use as directed 100 each 12  . [DISCONTINUED] lisinopril (PRINIVIL,ZESTRIL) 40 MG tablet Take 1 tablet (40 mg total) by mouth daily. (Patient not taking: Reported on 07/30/2015) 30 tablet 11  . [DISCONTINUED] sertraline (ZOLOFT) 50 MG tablet Take 1 tablet (50 mg total) by mouth daily. (Patient not taking: Reported on 07/30/2015) 30 tablet 3   No current facility-administered medications on file prior to visit.     Allergies  Allergen Reactions  . Lisinopril Hives and Swelling    Facial   . Sertraline Hives and Swelling    Facial   . Tramadol Hives and Swelling    facial  . Ibuprofen Hives and Swelling    Social History    Social History  . Marital status: Single    Spouse name: N/A  . Number of children: 2  . Years of education: N/A   Occupational History  . Not on file.   Social History Main Topics  . Smoking status: Never Smoker  . Smokeless tobacco: Never Used  . Alcohol use No  . Drug use: No  . Sexual activity: Yes    Birth control/ protection: None   Other Topics Concern  . Not on file   Social History Narrative   Lives in [place].  Presently unemployed but attends school full time    Family History  Problem Relation Age of Onset  . Diabetes Unknown        multiple  . Heart failure Paternal Grandmother   . Breast cancer Paternal Grandmother   . Heart disease Unknown        multiple  . Breast cancer Mother     Past Surgical History:  Procedure Laterality Date  . CERVIX LESION DESTRUCTION    . DILATION AND CURETTAGE OF UTERUS    . LEFT HEART CATHETERIZATION WITH CORONARY ANGIOGRAM N/A 05/21/2011   Procedure: LEFT HEART CATHETERIZATION WITH CORONARY ANGIOGRAM;  Surgeon: Sherren Mocha, MD;  Location: [facility] CATH LAB;  Service: Cardiovascular;  Laterality: N/A;  . TUBAL LIGATION  1996    ROS: Review of Systems Negative except as stated above PHYSICAL EXAM: BP 124/82   Pulse 92   Temp 98.2 F (36.8 C) (Oral)   Resp 16   Wt 211 lb 9.6 oz (96 kg)   SpO2 96%   BMI 31.25 kg/m   Physical Exam General appearance - alert, well appearing, and in no distress Mental status - alert, oriented to person, place, and time, normal mood, behavior, speech, dress, motor activity, and thought processes Chest - clear to auscultation, no wheezes, rales or rhonchi, symmetric air entry Heart - normal rate, regular rhythm, normal S1, S2, no murmurs, rubs, clicks or gallops Extremities - trace LE edema MSK: mild edema over LT lateral malleolus. No point tenderness. Good range of motion at the ankle joint  Results for orders placed or performed in visit on 09/25/16  POCT glucose (manual entry)   Result Value Ref Range   POC Glucose 319 (A) 70 - 99 mg/dl  POCT glycosylated hemoglobin (Hb A1C)  Result Value Ref Range   Hemoglobin A1C 12.0     ASSESSMENT AND PLAN: 1. Uncontrolled type 2 diabetes mellitus without complication, with long-term current use of insulin ([place]) -Advised to give herself an extra 10 units of Lantus tonight after dinner. -Starting tomorrow evening she will take Lantus 15 units daily. Continue to check blood sugars at least twice a day. After 3 days, if a.m. blood sugars are still greater  than 140, she will increase Lantus to 20 units daily.  Follow-up in 2 weeks -Continue healthy eating habits and regular exercise - POCT glucose (manual entry) - POCT glycosylated hemoglobin (Hb A1C) - Insulin Glargine (LANTUS SOLOSTAR) 100 UNIT/ML Solostar Pen; Inject 15 Units into the skin daily. At bedtime  Dispense: 5 pen; Refill: 5  2. Lung nodules -nurse to schedule for end of Sept - CT CHEST NODULE FOLLOW UP LOW DOSE W/O; Future  3. Edema of left ankle -no pain. Advise use of ACE wrap PRn   Patient was given the opportunity to ask questions.  Patient verbalized understanding of the plan and was able to repeat key elements of the plan.   Orders Placed This Encounter  Procedures  . CT CHEST NODULE FOLLOW UP LOW DOSE W/O  . POCT glucose (manual entry)  . POCT glycosylated hemoglobin (Hb A1C)     Requested Prescriptions   Signed Prescriptions Disp Refills  . Insulin Glargine (LANTUS SOLOSTAR) 100 UNIT/ML Solostar Pen 5 pen 5    Sig: Inject 15 Units into the skin daily. At bedtime    Return in about 2 weeks (around 10/09/2016).  Karle Plumber, MD, FACP

## 2016-09-26 ENCOUNTER — Encounter: Payer: Self-pay | Admitting: Cardiology

## 2016-09-26 MED FILL — !LANTUS SOLOSTAR 100UNITS/M: 100 | 20 days supply | Qty: 3 | Fill #0

## 2016-10-01 MED FILL — ?SPIRONOLACTONE 25 MG TABLE: 25 | 30 days supply | Qty: 30 | Fill #1

## 2016-10-01 MED FILL — ?CARVEDILOL 6.25 MG TABLET: 6.25 | 30 days supply | Qty: 60 | Fill #2

## 2016-10-01 MED FILL — ISOSORBIDE MN ER 30 MG TAB: 30 | 30 days supply | Qty: 15 | Fill #2

## 2016-10-03 ENCOUNTER — Other Ambulatory Visit: Payer: Self-pay

## 2016-10-03 ENCOUNTER — Ambulatory Visit (HOSPITAL_COMMUNITY): Payer: Medicaid Other | Attending: Cardiology

## 2016-10-03 DIAGNOSIS — Z8673 Personal history of transient ischemic attack (TIA), and cerebral infarction without residual deficits: Secondary | ICD-10-CM | POA: Diagnosis not present

## 2016-10-03 DIAGNOSIS — I428 Other cardiomyopathies: Secondary | ICD-10-CM | POA: Diagnosis not present

## 2016-10-03 DIAGNOSIS — I5020 Unspecified systolic (congestive) heart failure: Secondary | ICD-10-CM | POA: Insufficient documentation

## 2016-10-03 DIAGNOSIS — I081 Rheumatic disorders of both mitral and tricuspid valves: Secondary | ICD-10-CM | POA: Diagnosis not present

## 2016-10-03 DIAGNOSIS — E119 Type 2 diabetes mellitus without complications: Secondary | ICD-10-CM | POA: Diagnosis not present

## 2016-10-03 DIAGNOSIS — I110 Hypertensive heart disease with heart failure: Secondary | ICD-10-CM | POA: Insufficient documentation

## 2016-10-05 ENCOUNTER — Telehealth: Payer: Self-pay | Admitting: *Deleted

## 2016-10-05 NOTE — Telephone Encounter (Addendum)
-----   Message from Lewayne Bunting, MD sent at 10/04/2016  5:28 PM EDT ----- Refer dr Johney Frame for icd [name]  Left message for pt to call

## 2016-10-05 NOTE — Telephone Encounter (Signed)
Spoke with pt, she would like some information sent to her regarding ICD to help her make the descision. Information mailed to the patient.

## 2016-10-11 NOTE — Progress Notes (Deleted)
HPI: FU CM/CHF. Echocardiogram in April of 2013 showed an ejection fraction of 35%, moderate left ventricular enlargement, mild to moderate mitral regurgitation. HIV negative and TSH normal. Cardiac catheterization in April of 2013 showed normal coronary arteries and an ejection fraction of 35-40%. Patient was treated with medications and her cardiomyopathy was felt possibly secondary to hypertension. Echocardiogram repeated in September of 2013. Her ejection fraction was 30-35%. Patient referred to Dr. Rayann Heman for consideration of ICD but the patient decided not to pursue. Admitted 4/18 with CHF due to noncompliance (ran out of meds). Troponin 1.1 felt demand ischemia. Also with h/o carotid dissection healed on CTA 4/18. Chest CT 4/18 showed lung nodules and fu recommended 3-6 months. Echocardiogram repeated August 2018 and showed ejection fraction 36-06%, grade 1 diastolic dysfunction, mild mitral regurgitation and mild left atrial enlargement. Since last seen,   Current Outpatient Prescriptions  Medication Sig Dispense Refill  . albuterol (PROVENTIL HFA;VENTOLIN HFA) 108 (90 Base) MCG/ACT inhaler Inhale 1 puff into the lungs every 6 (six) hours as needed for wheezing or shortness of breath. 54 g 3  . aspirin 81 MG chewable tablet Chew 1 tablet (81 mg total) by mouth daily. 30 tablet 0  . atorvastatin (LIPITOR) 80 MG tablet Take 1 tablet (80 mg total) by mouth daily at 6 PM. 30 tablet 11  . blood glucose meter kit and supplies Dispense based on patient and insurance preference. Use up to four times daily as directed. (FOR ICD-9 250.00, 250.01). 1 each 0  . Blood Glucose Monitoring Suppl (TRUE METRIX METER) w/Device KIT Use as directed 1 kit 0  . carvedilol (COREG) 6.25 MG tablet Take 1 tablet (6.25 mg total) by mouth 2 (two) times daily with a meal. 60 tablet 11  . furosemide (LASIX) 40 MG tablet Take 1 tablet (40 mg total) by mouth 2 (two) times daily. 60 tablet 11  . glucose blood (TRUE  METRIX BLOOD GLUCOSE TEST) test strip Use as instructed 100 each 12  . hydrALAZINE (APRESOLINE) 10 MG tablet Take 1 tablet (10 mg total) by mouth 3 (three) times daily. 90 tablet 11  . Insulin Glargine (LANTUS SOLOSTAR) 100 UNIT/ML Solostar Pen Inject 15 Units into the skin daily. At bedtime 5 pen 5  . Insulin Pen Needle 31G X 5 MM MISC Use as directed 100 each 1  . isosorbide mononitrate (IMDUR) 30 MG 24 hr tablet Take 0.5 tablets (15 mg total) by mouth daily. 30 tablet 11  . metFORMIN (GLUCOPHAGE XR) 500 MG 24 hr tablet Take 2 tablets (1,000 mg total) by mouth 2 (two) times daily with a meal. 120 tablet 2  . metroNIDAZOLE (FLAGYL) 500 MG tablet Take 1 tablet (500 mg total) by mouth 2 (two) times daily. (Patient not taking: Reported on 09/25/2016) 14 tablet 0  . potassium chloride (K-DUR,KLOR-CON) 10 MEQ tablet Take 1 tablet (10 mEq total) by mouth once. 30 tablet 6  . spironolactone (ALDACTONE) 25 MG tablet Take 1 tablet (25 mg total) by mouth daily. 90 tablet 3  . TRUEPLUS LANCETS 28G MISC Use as directed 100 each 12   No current facility-administered medications for this visit.      Past Medical History:  Diagnosis Date  . Diabetes mellitus, type 2 (HCC)    A1C 7.6 April '13  . Hypertension   . Left leg pain 07/21/2015  . Nonischemic cardiomyopathy ([place])    Echo 05/19/11 EF 35% w/ mild-mod MR; R/L Heart Cath 05/21/11 - mildly elevated right  heart pressures, normal left heart pressures, preserved cardiac output, widely patent coronary arteries without significant CAD and moderate global systolic LV dysfunction, EF 35-40%.  . 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 Systolic CHF ([place])    Echo 05/19/11 EF 35% w/ mild-mod MR    Past Surgical History:  Procedure Laterality Date  . CERVIX LESION DESTRUCTION    . DILATION AND CURETTAGE OF UTERUS    . LEFT HEART CATHETERIZATION WITH CORONARY ANGIOGRAM N/A 05/21/2011   Procedure: LEFT HEART CATHETERIZATION WITH  CORONARY ANGIOGRAM;  Surgeon: Sherren Mocha, MD;  Location: [facility] CATH LAB;  Service: Cardiovascular;  Laterality: N/A;  . TUBAL LIGATION  1996    Social History   Social History  . Marital status: Single    Spouse name: N/A  . Number of children: 2  . Years of education: N/A   Occupational History  . Not on file.   Social History Main Topics  . Smoking status: Never Smoker  . Smokeless tobacco: Never Used  . Alcohol use No  . Drug use: No  . Sexual activity: Yes    Birth control/ protection: None   Other Topics Concern  . Not on file   Social History Narrative   Lives in [place].  Presently unemployed but attends school full time    Family History  Problem Relation Age of Onset  . Diabetes Unknown        multiple  . Heart failure Paternal Grandmother   . Breast cancer Paternal Grandmother   . Heart disease Unknown        multiple  . Breast cancer Mother     ROS: no fevers or chills, productive cough, hemoptysis, dysphasia, odynophagia, melena, hematochezia, dysuria, hematuria, rash, seizure activity, orthopnea, PND, pedal edema, claudication. Remaining systems are negative.  Physical Exam: Well-developed well-nourished in no acute distress.  Skin is warm and dry.  HEENT is normal.  Neck is supple.  Chest is clear to auscultation with normal expansion.  Cardiovascular exam is regular rate and rhythm.  Abdominal exam nontender or distended. No masses palpated. Extremities show no edema. neuro grossly intact  ECG- personally reviewed  A/P  1  Kirk Ruths, MD

## 2016-10-16 MED FILL — !LANTUS SOLOSTAR 100UNITS/M: 100 | 30 days supply | Qty: 3 | Fill #2

## 2016-10-19 ENCOUNTER — Ambulatory Visit: Payer: Self-pay | Admitting: Cardiology

## 2016-11-14 MED FILL — ISOSORBIDE MN ER 30 MG TAB: 30 | 30 days supply | Qty: 15 | Fill #3

## 2016-11-14 MED FILL — ?CARVEDILOL 6.25 MG TABLET: 6.25 | 30 days supply | Qty: 60 | Fill #3

## 2016-11-14 MED FILL — METFORMIN HCL ER 500 MG TAB: 500 | 30 days supply | Qty: 120 | Fill #1

## 2016-11-14 MED FILL — SPIRONOLACTONE 25 MG TABLET: 25 | 30 days supply | Qty: 30 | Fill #2

## 2016-11-16 MED FILL — FUROSEMIDE 40 MG TABLET: 40 | 30 days supply | Qty: 60 | Fill #1

## 2016-12-17 MED FILL — !LANTUS SOLOSTAR 100UNITS/M: 100 | 30 days supply | Qty: 3 | Fill #3

## 2016-12-25 ENCOUNTER — Ambulatory Visit: Payer: Medicaid Other | Admitting: Internal Medicine

## 2016-12-27 NOTE — Progress Notes (Deleted)
HPI: FU CM/CHF. Echocardiogram in April of 2013 showed an ejection fraction of 35%, moderate left ventricular enlargement, mild to moderate mitral regurgitation. HIV negative and TSH normal. Cardiac catheterization in April of 2013 showed normal coronary arteries and an ejection fraction of 35-40%. Patient was treated with medications and her cardiomyopathy was felt possibly secondary to hypertension. Echocardiogram repeated in September of 2013. Her ejection fraction was 30-35%. Patient referred to Dr. Rayann Heman for consideration of ICD but the patient decided not to pursue. Also with h/o carotid dissection healed on CTA 4/18. Chest CT 4/18 showed lung nodules and fu recommended 3-6 months. Echocardiogram repeated August 2018. Ejection fraction 28-41%, grade 1 diastolic dysfunction, mild mitral regurgitation and mild left atrial enlargement. Also with history of noncompliance. Since last seen,   Current Outpatient Medications  Medication Sig Dispense Refill  . albuterol (PROVENTIL HFA;VENTOLIN HFA) 108 (90 Base) MCG/ACT inhaler Inhale 1 puff into the lungs every 6 (six) hours as needed for wheezing or shortness of breath. 54 g 3  . aspirin 81 MG chewable tablet Chew 1 tablet (81 mg total) by mouth daily. 30 tablet 0  . atorvastatin (LIPITOR) 80 MG tablet Take 1 tablet (80 mg total) by mouth daily at 6 PM. 30 tablet 11  . blood glucose meter kit and supplies Dispense based on patient and insurance preference. Use up to four times daily as directed. (FOR ICD-9 250.00, 250.01). 1 each 0  . Blood Glucose Monitoring Suppl (TRUE METRIX METER) w/Device KIT Use as directed 1 kit 0  . carvedilol (COREG) 6.25 MG tablet Take 1 tablet (6.25 mg total) by mouth 2 (two) times daily with a meal. 60 tablet 11  . furosemide (LASIX) 40 MG tablet Take 1 tablet (40 mg total) by mouth 2 (two) times daily. 60 tablet 11  . glucose blood (TRUE METRIX BLOOD GLUCOSE TEST) test strip Use as instructed 100 each 12  .  hydrALAZINE (APRESOLINE) 10 MG tablet Take 1 tablet (10 mg total) by mouth 3 (three) times daily. 90 tablet 11  . Insulin Glargine (LANTUS SOLOSTAR) 100 UNIT/ML Solostar Pen Inject 15 Units into the skin daily. At bedtime 5 pen 5  . Insulin Pen Needle 31G X 5 MM MISC Use as directed 100 each 1  . isosorbide mononitrate (IMDUR) 30 MG 24 hr tablet Take 0.5 tablets (15 mg total) by mouth daily. 30 tablet 11  . metFORMIN (GLUCOPHAGE XR) 500 MG 24 hr tablet Take 2 tablets (1,000 mg total) by mouth 2 (two) times daily with a meal. 120 tablet 2  . metroNIDAZOLE (FLAGYL) 500 MG tablet Take 1 tablet (500 mg total) by mouth 2 (two) times daily. (Patient not taking: Reported on 09/25/2016) 14 tablet 0  . potassium chloride (K-DUR,KLOR-CON) 10 MEQ tablet Take 1 tablet (10 mEq total) by mouth once. 30 tablet 6  . spironolactone (ALDACTONE) 25 MG tablet Take 1 tablet (25 mg total) by mouth daily. 90 tablet 3  . TRUEPLUS LANCETS 28G MISC Use as directed 100 each 12   No current facility-administered medications for this visit.      Past Medical History:  Diagnosis Date  . Diabetes mellitus, type 2 (HCC)    A1C 7.6 April '13  . Hypertension   . Left leg pain 07/21/2015  . Nonischemic cardiomyopathy ([place])    Echo 05/19/11 EF 35% w/ mild-mod MR; R/L Heart Cath 05/21/11 - mildly elevated right heart pressures, normal left heart pressures, preserved cardiac output, widely patent coronary arteries  without significant CAD and moderate global systolic LV dysfunction, EF 35-40%.  . 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 Systolic CHF ([place])    Echo 05/19/11 EF 35% w/ mild-mod MR    Past Surgical History:  Procedure Laterality Date  . CERVIX LESION DESTRUCTION    . DILATION AND CURETTAGE OF UTERUS    . LEFT HEART CATHETERIZATION WITH CORONARY ANGIOGRAM N/A 05/21/2011   Procedure: LEFT HEART CATHETERIZATION WITH CORONARY ANGIOGRAM;  Surgeon: Sherren Mocha, MD;  Location: [facility] CATH LAB;   Service: Cardiovascular;  Laterality: N/A;  . TUBAL LIGATION  1996    Social History   Socioeconomic History  . Marital status: Single    Spouse name: Not on file  . Number of children: 2  . Years of education: Not on file  . Highest education level: Not on file  Social Needs  . Financial resource strain: Not on file  . Food insecurity - worry: Not on file  . Food insecurity - inability: Not on file  . Transportation needs - medical: Not on file  . Transportation needs - non-medical: Not on file  Occupational History  . Not on file  Tobacco Use  . Smoking status: Never Smoker  . Smokeless tobacco: Never Used  Substance and Sexual Activity  . Alcohol use: No  . Drug use: No  . Sexual activity: Yes    Birth control/protection: None  Other Topics Concern  . Not on file  Social History Narrative   Lives in [place].  Presently unemployed but attends school full time    Family History  Problem Relation Age of Onset  . Diabetes Unknown        multiple  . Heart failure Paternal Grandmother   . Breast cancer Paternal Grandmother   . Heart disease Unknown        multiple  . Breast cancer Mother     ROS: no fevers or chills, productive cough, hemoptysis, dysphasia, odynophagia, melena, hematochezia, dysuria, hematuria, rash, seizure activity, orthopnea, PND, pedal edema, claudication. Remaining systems are negative.  Physical Exam: Well-developed well-nourished in no acute distress.  Skin is warm and dry.  HEENT is normal.  Neck is supple.  Chest is clear to auscultation with normal expansion.  Cardiovascular exam is regular rate and rhythm.  Abdominal exam nontender or distended. No masses palpated. Extremities show no edema. neuro grossly intact  ECG- personally reviewed  A/P  1  Kirk Ruths, MD

## 2016-12-31 MED FILL — METFORMIN HCL ER 500 MG TAB: 500 | 30 days supply | Qty: 120 | Fill #2

## 2016-12-31 MED FILL — CARVEDILOL 6.25 MG TABLET: 6.25 | 30 days supply | Qty: 60 | Fill #4

## 2016-12-31 MED FILL — FUROSEMIDE 40 MG TAB: 40 | 30 days supply | Qty: 60 | Fill #2

## 2017-01-07 MED FILL — TRUEPLUS PEN NDL 31G X 1/4": 31G X 6 MM | 25 days supply | Qty: 100 | Fill #1

## 2017-01-07 MED FILL — TRUEPLUS PEN NDL 31G X 1/4: 31G X 6 MM | 25 days supply | Qty: 100 | Fill #1

## 2017-01-08 ENCOUNTER — Ambulatory Visit: Payer: Self-pay | Admitting: Cardiology

## 2017-02-13 MED FILL — METFORMIN HCL ER 500 MG TAB: 500 | 30 days supply | Qty: 120 | Fill #1

## 2017-02-13 MED FILL — CARVEDILOL 6.25 MG TABLET: 6.25 | 30 days supply | Qty: 60 | Fill #5

## 2017-02-14 ENCOUNTER — Telehealth: Payer: Self-pay | Admitting: Internal Medicine

## 2017-02-14 MED ORDER — METFORMIN HCL ER 500 MG PO TB24: 1000.0000 mg | ORAL_TABLET | Freq: Two times a day (BID) | ORAL | 6 refills | Status: DC

## 2017-02-14 MED FILL — LANTUS SOLOSTAR 100 UNITS/M: 100 | 30 days supply | Qty: 3 | Fill #4

## 2017-02-14 NOTE — Telephone Encounter (Signed)
Pt called to request a refill for metFORMIN (GLUCOPHAGE XR) 500 MG 24 hr tablet  Please sent it to [facility] pharmacy

## 2017-02-14 NOTE — Telephone Encounter (Signed)
Rxn for Metformin sent to [facility] pharmacy.

## 2017-02-21 ENCOUNTER — Encounter: Payer: Self-pay | Admitting: Internal Medicine

## 2017-02-21 ENCOUNTER — Ambulatory Visit: Payer: Medicaid Other | Attending: Internal Medicine | Admitting: Internal Medicine

## 2017-02-21 ENCOUNTER — Other Ambulatory Visit (HOSPITAL_COMMUNITY)
Admission: RE | Admit: 2017-02-21 | Discharge: 2017-02-21 | Disposition: A | Discharge status: Home / Self Care | Payer: Medicaid Other | Source: Ambulatory Visit | Attending: Internal Medicine | Admitting: Internal Medicine

## 2017-02-21 VITALS — BP 138/88 | HR 100 | Temp 97.9°F | Resp 16 | Wt 211.0 lb

## 2017-02-21 DIAGNOSIS — I10 Essential (primary) hypertension: Secondary | ICD-10-CM | POA: Insufficient documentation

## 2017-02-21 DIAGNOSIS — Z7982 Long term (current) use of aspirin: Secondary | ICD-10-CM | POA: Diagnosis not present

## 2017-02-21 DIAGNOSIS — E1142 Type 2 diabetes mellitus with diabetic polyneuropathy: Secondary | ICD-10-CM | POA: Insufficient documentation

## 2017-02-21 DIAGNOSIS — I420 Dilated cardiomyopathy: Secondary | ICD-10-CM | POA: Insufficient documentation

## 2017-02-21 DIAGNOSIS — E785 Hyperlipidemia, unspecified: Secondary | ICD-10-CM | POA: Insufficient documentation

## 2017-02-21 DIAGNOSIS — M79605 Pain in left leg: Secondary | ICD-10-CM

## 2017-02-21 DIAGNOSIS — Z794 Long term (current) use of insulin: Secondary | ICD-10-CM

## 2017-02-21 DIAGNOSIS — Z79899 Other long term (current) drug therapy: Secondary | ICD-10-CM | POA: Insufficient documentation

## 2017-02-21 DIAGNOSIS — L84 Corns and callosities: Secondary | ICD-10-CM | POA: Insufficient documentation

## 2017-02-21 DIAGNOSIS — Z2821 Immunization not carried out because of patient refusal: Secondary | ICD-10-CM | POA: Diagnosis not present

## 2017-02-21 DIAGNOSIS — N898 Other specified noninflammatory disorders of vagina: Secondary | ICD-10-CM | POA: Insufficient documentation

## 2017-02-21 DIAGNOSIS — E119 Type 2 diabetes mellitus without complications: Secondary | ICD-10-CM | POA: Diagnosis present

## 2017-02-21 DIAGNOSIS — E1165 Type 2 diabetes mellitus with hyperglycemia: Secondary | ICD-10-CM

## 2017-02-21 DIAGNOSIS — E118 Type 2 diabetes mellitus with unspecified complications: Secondary | ICD-10-CM | POA: Diagnosis not present

## 2017-02-21 DIAGNOSIS — IMO0001 Reserved for inherently not codable concepts without codable children: Secondary | ICD-10-CM

## 2017-02-21 DIAGNOSIS — IMO0002 Reserved for concepts with insufficient information to code with codable children: Secondary | ICD-10-CM

## 2017-02-21 LAB — POCT URINALYSIS DIPSTICK
Bilirubin, UA: NEGATIVE
Blood, UA: NEGATIVE
Glucose, UA: 500
KETONES UA: NEGATIVE
Leukocytes, UA: NEGATIVE
NITRITE UA: NEGATIVE
PH UA: 5 (ref 5.0–8.0)
PROTEIN UA: NEGATIVE
Spec Grav, UA: <=1.005 — AB (ref 1.010–1.025)
Urobilinogen, UA: 0.2 E.U./dL

## 2017-02-21 LAB — GLUCOSE, POCT (MANUAL RESULT ENTRY)
POC GLUCOSE: 403 mg/dL — AB (ref 70–99)
POC Glucose: 401 mg/dl — AB (ref 70–99)

## 2017-02-21 LAB — POCT GLYCOSYLATED HEMOGLOBIN (HGB A1C): Hemoglobin A1C: 12.7

## 2017-02-21 MED ORDER — ATORVASTATIN CALCIUM 80 MG PO TABS: 40.0000 mg | ORAL_TABLET | Freq: Every day | ORAL | 11 refills | Status: DC

## 2017-02-21 MED ORDER — INSULIN GLARGINE 100 UNIT/ML SOLOSTAR PEN: 16.0000 [IU] | PEN_INJECTOR | Freq: Every day | SUBCUTANEOUS | 5 refills | Status: DC

## 2017-02-21 MED ORDER — FLUCONAZOLE 150 MG PO TABS: 150.0000 mg | ORAL_TABLET | Freq: Once | ORAL | 0 refills | Status: AC

## 2017-02-21 MED ORDER — TRIAMCINOLONE ACETONIDE 0.1 % EX CREA: TOPICAL_CREAM | CUTANEOUS | 0 refills | Status: DC

## 2017-02-21 MED ORDER — GABAPENTIN 300 MG PO CAPS: 300.0000 mg | ORAL_CAPSULE | Freq: Every day | ORAL | 3 refills | Status: DC

## 2017-02-21 MED ORDER — INSULIN ASPART 100 UNIT/ML ~~LOC~~ SOLN
10.0000 [IU] | Freq: Once | SUBCUTANEOUS | Status: AC
  Administered 2017-02-21: 10 [IU] via SUBCUTANEOUS

## 2017-02-21 MED ORDER — HYDRALAZINE HCL 10 MG PO TABS: 10.0000 mg | ORAL_TABLET | Freq: Three times a day (TID) | ORAL | 2 refills | Status: DC

## 2017-02-21 MED ORDER — NYSTATIN-TRIAMCINOLONE 100000-0.1 UNIT/GM-% EX OINT: TOPICAL_OINTMENT | CUTANEOUS | 0 refills | Status: DC

## 2017-02-21 MED ORDER — NYSTATIN 100000 UNIT/GM EX CREA: TOPICAL_CREAM | CUTANEOUS | 0 refills | Status: DC

## 2017-02-21 MED FILL — GABAPENTIN 300 MG CAPSULE: 300 | 30 days supply | Qty: 30 | Fill #0

## 2017-02-21 MED FILL — NYSTATIN 100,000 UNIT/GM CR: 100000 | 15 days supply | Qty: 30 | Fill #0

## 2017-02-21 MED FILL — ATORVASTATIN 80 MG TABLET: 80 | 30 days supply | Qty: 15 | Fill #0

## 2017-02-21 MED FILL — LANTUS SOLOSTAR 100 UNITS/M: 100 | 30 days supply | Qty: 6 | Fill #0

## 2017-02-21 MED FILL — FLUCONAZOLE 150 MG TABLET: 150 | 1 days supply | Qty: 1 | Fill #0

## 2017-02-21 MED FILL — hydrALAZINE HCL 10 MG TABS: 10 | 30 days supply | Qty: 90 | Fill #0

## 2017-02-21 MED FILL — TRIAMCINOLONE ACETONIDE 0.1: 0.1 | 15 days supply | Qty: 30 | Fill #0

## 2017-02-21 NOTE — Progress Notes (Signed)
Patient ID: [name], female    DOB: 1965-12-03  MRN: [id number]  CC: Diabetes and Hypertension   Subjective: [name] is a 52 y.o. female who presents for chronic ds management. Last seen 09/2016 Her concerns today include:  DM. DCM/CHF with EF25% on 05/2016, HL, lung nodules, HTN, hx of carotid dissection  1. C/o vaginal irritation and dryness which she thinks is yeast infection. +itching.  Used OTC antifungal without relief of symptoms. -no concern for STI.  She has not been sexually active  2. C/o pain on sole of LT foot that radiates up the leg to about the mid thigh X 1 WK -some numbness in foot but not in leg -no recent injury to this foot or leg No LBP.  No worse or better with ambulation than at rest -Worse 1 week ago.  Taking OTC pain med Q 4 hrs Also endorses pain and tinglinng in Rt foot but this she states is chronic  3.  DM: Reports taking Lantus 12 units daily and Metformin 1 gram BID.  Not checking BS regularly Eating habits: eat more when stress.  Exercise: Not much besides going up and down the steps in her home  4.  HTN/dilated cardiomyopathy:  Taking only Coreg and Furemside (Takes Furosemide only once a day) Not taking Hydralazine, Spironolactone and Lipitor.  Having some memory issues  Patient Active Problem List   Diagnosis Date Noted  . Lung nodules 07/16/2016  . Uncontrolled type 2 diabetes mellitus without complication, with long-term current use of insulin ([place]) 07/16/2016  . DCM (dilated cardiomyopathy) ([place])   . Acute on chronic combined systolic and diastolic CHF (congestive heart failure) ([place]) 06/10/2016  . Benign essential HTN 06/10/2016  . Dyslipidemia associated with type 2 diabetes mellitus ([place]) 06/10/2016  . Bilateral pleural effusion 06/10/2016     Current Outpatient Medications on File Prior to Visit  Medication Sig Dispense Refill  . albuterol (PROVENTIL HFA;VENTOLIN HFA) 108 (90 Base) MCG/ACT inhaler Inhale 1 puff into  the lungs every 6 (six) hours as needed for wheezing or shortness of breath. 54 g 3  . aspirin 81 MG chewable tablet Chew 1 tablet (81 mg total) by mouth daily. 30 tablet 0  . blood glucose meter kit and supplies Dispense based on patient and insurance preference. Use up to four times daily as directed. (FOR ICD-9 250.00, 250.01). 1 each 0  . Blood Glucose Monitoring Suppl (TRUE METRIX METER) w/Device KIT Use as directed 1 kit 0  . carvedilol (COREG) 6.25 MG tablet Take 1 tablet (6.25 mg total) by mouth 2 (two) times daily with a meal. 60 tablet 11  . furosemide (LASIX) 40 MG tablet Take 1 tablet (40 mg total) by mouth 2 (two) times daily. 60 tablet 11  . glucose blood (TRUE METRIX BLOOD GLUCOSE TEST) test strip Use as instructed 100 each 12  . Insulin Pen Needle 31G X 5 MM MISC Use as directed 100 each 1  . metFORMIN (GLUCOPHAGE XR) 500 MG 24 hr tablet Take 2 tablets (1,000 mg total) by mouth 2 (two) times daily with a meal. 120 tablet 6  . TRUEPLUS LANCETS 28G MISC Use as directed 100 each 12  . [DISCONTINUED] lisinopril (PRINIVIL,ZESTRIL) 40 MG tablet Take 1 tablet (40 mg total) by mouth daily. (Patient not taking: Reported on 07/30/2015) 30 tablet 11  . [DISCONTINUED] sertraline (ZOLOFT) 50 MG tablet Take 1 tablet (50 mg total) by mouth daily. (Patient not taking: Reported on 07/30/2015) 30 tablet 3  No current facility-administered medications on file prior to visit.     Allergies  Allergen Reactions  . Lisinopril Hives and Swelling    Facial   . Sertraline Hives and Swelling    Facial   . Tramadol Hives and Swelling    facial  . Ibuprofen Hives and Swelling    Social History   Socioeconomic History  . Marital status: Single    Spouse name: Not on file  . Number of children: 2  . Years of education: Not on file  . Highest education level: Not on file  Social Needs  . Financial resource strain: Not on file  . Food insecurity - worry: Not on file  . Food insecurity -  inability: Not on file  . Transportation needs - medical: Not on file  . Transportation needs - non-medical: Not on file  Occupational History  . Not on file  Tobacco Use  . Smoking status: Never Smoker  . Smokeless tobacco: Never Used  Substance and Sexual Activity  . Alcohol use: No  . Drug use: No  . Sexual activity: Yes    Birth control/protection: None  Other Topics Concern  . Not on file  Social History Narrative   Lives in [place].  Presently unemployed but attends school full time    Family History  Problem Relation Age of Onset  . Diabetes Unknown        multiple  . Heart failure Paternal Grandmother   . Breast cancer Paternal Grandmother   . Heart disease Unknown        multiple  . Breast cancer Mother     Past Surgical History:  Procedure Laterality Date  . CERVIX LESION DESTRUCTION    . DILATION AND CURETTAGE OF UTERUS    . LEFT HEART CATHETERIZATION WITH CORONARY ANGIOGRAM N/A 05/21/2011   Procedure: LEFT HEART CATHETERIZATION WITH CORONARY ANGIOGRAM;  Surgeon: Sherren Mocha, MD;  Location: [facility] CATH LAB;  Service: Cardiovascular;  Laterality: N/A;  . TUBAL LIGATION  1996    ROS: Review of Systems Neg except as above  PHYSICAL EXAM: BP 138/88   Pulse 100   Temp 97.9 F (36.6 C) (Oral)   Resp 16   Wt 211 lb (95.7 kg)   SpO2 99%   BMI 31.16 kg/m   Repeat BP 150/100 Physical Exam General appearance - alert, well appearing, and in no distress Mental status - alert, oriented to person, place, and time, normal mood, behavior, speech, dress, motor activity, and thought processes Neck - supple, no significant adenopathy Chest - clear to auscultation, no wheezes, rales or rhonchi, symmetric air entry Heart - normal rate, regular rhythm, normal S1, S2, no murmurs, rubs, clicks or gallops Neurological -power in lower extremities 5/5 bilaterally.  Gross sensation intact in both lower extremities.  Leap exam was normal bilaterally.  Area that is painful on  sole of RT foot corresponds to 2 cm painful callous at base of 5th MT Extremities -dorsalis pedis, posterior tibialis and popliteal pulses 3+ bilaterally.   Pelvic exam: CMA Pollock present -rubbery appearance to external vaginal area.  Small amount of thick white discharge around the cervix.  Results for orders placed or performed in visit on 02/21/17  POCT glucose (manual entry)  Result Value Ref Range   POC Glucose 403 (A) 70 - 99 mg/dl  POCT glycosylated hemoglobin (Hb A1C)  Result Value Ref Range   Hemoglobin A1C 12.7   POCT urinalysis dipstick  Result Value Ref Range   Color,  UA yellow    Clarity, UA clear    Glucose, UA 500    Bilirubin, UA negative    Ketones, UA negative    Spec Grav, UA <=1.005 (A) 1.010 - 1.025   Blood, UA negative    pH, UA 5.0 5.0 - 8.0   Protein, UA negative    Urobilinogen, UA 0.2 0.2 or 1.0 E.U./dL   Nitrite, UA negative    Leukocytes, UA Negative Negative   Appearance     Odor    POCT glucose (manual entry)  Result Value Ref Range   POC Glucose 401 (A) 70 - 99 mg/dl    ASSESSMENT AND PLAN: 1. Diabetes mellitus type 2 with complications, uncontrolled ([place]) Discussed the importance of healthy eating habits, regular aerobic exercise (at least 150 minutes a week as tolerated) and medication compliance to achieve or maintain control of diabetes. -Recommend increase Lantus from 12 to 16 units daily.  Went over with her how to titrate Lantus every 3 days until morning blood sugars less than 150. - POCT glucose (manual entry) - POCT glycosylated hemoglobin (Hb A1C) - POCT urinalysis dipstick - insulin aspart (novoLOG) injection 10 Units - Insulin Glargine (LANTUS SOLOSTAR) 100 UNIT/ML Solostar Pen; Inject 16 Units into the skin daily. At bedtime  Dispense: 5 pen; Refill: 5 - Comprehensive metabolic panel - CBC - POCT glucose (manual entry)  2. Diabetic polyneuropathy associated with type 2 diabetes mellitus (HCC) - gabapentin (NEURONTIN) 300 MG  capsule; Take 1 capsule (300 mg total) by mouth at bedtime.  Dispense: 30 capsule; Refill: 3 - Vitamin B12  3. Pain of left lower extremity -Likely neuropathy related to diabetes.  We will try her with low dose of gabapentin.  Patient informed that medication can cause drowsiness.  4. Essential hypertension Not at goal. Restart hydralazine.  Continue carvedilol.  Continue furosemide. Short term f/u on BP and DM - hydrALAZINE (APRESOLINE) 10 MG tablet; Take 1 tablet (10 mg total) by mouth 3 (three) times daily.  Dispense: 90 tablet; Refill: 2  5. Influenza vaccination declined  6. Vaginal irritation - Cervicovaginal ancillary only - fluconazole (DIFLUCAN) 150 MG tablet; Take 1 tablet (150 mg total) by mouth once for 1 dose.  Dispense: 1 tablet; Refill: 0  7. Hyperlipidemia, unspecified hyperlipidemia type Restart Lipitor. - atorvastatin (LIPITOR) 80 MG tablet; Take 0.5 tablets (40 mg total) by mouth daily at 6 PM.  Dispense: 30 tablet; Refill: 11  8. Pre-ulcerative corn or callous - Ambulatory referral to Podiatry  On f/u visit, we need to discuss need for f/u CT scan of chest to re-eval pulmonary nodules Patient was given the opportunity to ask questions.  Patient verbalized understanding of the plan and was able to repeat key elements of the plan.   Orders Placed This Encounter  Procedures  . Comprehensive metabolic panel  . CBC  . Vitamin B12  . Ambulatory referral to Podiatry  . POCT glucose (manual entry)  . POCT glycosylated hemoglobin (Hb A1C)  . POCT urinalysis dipstick  . POCT glucose (manual entry)     Requested Prescriptions   Signed Prescriptions Disp Refills  . atorvastatin (LIPITOR) 80 MG tablet 30 tablet 11    Sig: Take 0.5 tablets (40 mg total) by mouth daily at 6 PM.  . Insulin Glargine (LANTUS SOLOSTAR) 100 UNIT/ML Solostar Pen 5 pen 5    Sig: Inject 16 Units into the skin daily. At bedtime  . gabapentin (NEURONTIN) 300 MG capsule 30 capsule 3  Sig:  Take 1 capsule (300 mg total) by mouth at bedtime.  . fluconazole (DIFLUCAN) 150 MG tablet 1 tablet 0    Sig: Take 1 tablet (150 mg total) by mouth once for 1 dose.  . hydrALAZINE (APRESOLINE) 10 MG tablet 90 tablet 2    Sig: Take 1 tablet (10 mg total) by mouth 3 (three) times daily.  Marland Kitchen nystatin cream (MYCOSTATIN) 30 g 0    Sig: Apply to external vaginal area daily x 5 days then PRN itching  . triamcinolone cream (KENALOG) 0.1 % 30 g 0    Sig: Apply to external vaginal area daily x 5 days then PRN itching    Return in about 6 weeks (around 04/04/2017).  Karle Plumber, MD, FACP

## 2017-02-21 NOTE — Patient Instructions (Addendum)
Increase Lantus to 16 units daily.  Continue Metformin. Please check blood sugars daily and bring log on next visit.   Continue Carvedilol. Hydralazine has been added.  Restart Lipitor for cholesterol.

## 2017-02-21 NOTE — Progress Notes (Signed)
Pt states she is still taking spironolactone  Pt states her feet and legs hurt  Pt states she just ate not even 20 mins ago

## 2017-02-22 LAB — COMPREHENSIVE METABOLIC PANEL
A/G RATIO: 1.4 (ref 1.2–2.2)
ALK PHOS: 105 IU/L (ref 39–117)
ALT: 12 IU/L (ref 0–32)
AST: 12 IU/L (ref 0–40)
Albumin: 4.2 g/dL (ref 3.5–5.5)
BUN/Creatinine Ratio: 13 (ref 9–23)
BUN: 11 mg/dL (ref 6–24)
Bilirubin Total: 0.5 mg/dL (ref 0.0–1.2)
CO2: 25 mmol/L (ref 20–29)
Calcium: 10.1 mg/dL (ref 8.7–10.2)
Chloride: 95 mmol/L — ABNORMAL LOW (ref 96–106)
Creatinine, Ser: 0.87 mg/dL (ref 0.57–1.00)
GFR calc Af Amer: 90 mL/min/{1.73_m2} (ref >59)
GFR calc non Af Amer: 78 mL/min/{1.73_m2} (ref >59)
GLOBULIN, TOTAL: 2.9 g/dL (ref 1.5–4.5)
Glucose: 425 mg/dL — ABNORMAL HIGH (ref 65–99)
POTASSIUM: 4 mmol/L (ref 3.5–5.2)
SODIUM: 135 mmol/L (ref 134–144)
Total Protein: 7.1 g/dL (ref 6.0–8.5)

## 2017-02-22 LAB — CBC
Hematocrit: 45.1 % (ref 34.0–46.6)
Hemoglobin: 14.5 g/dL (ref 11.1–15.9)
MCH: 28.5 pg (ref 26.6–33.0)
MCHC: 32.2 g/dL (ref 31.5–35.7)
MCV: 89 fL (ref 79–97)
PLATELETS: 350 10*3/uL (ref 150–379)
RBC: 5.09 x10E6/uL (ref 3.77–5.28)
RDW: 13.8 % (ref 12.3–15.4)
WBC: 7.1 10*3/uL (ref 3.4–10.8)

## 2017-02-22 LAB — CERVICOVAGINAL ANCILLARY ONLY
Bacterial vaginitis: NEGATIVE
CANDIDA VAGINITIS: POSITIVE — AB

## 2017-02-22 LAB — VITAMIN B12: VITAMIN B 12: 413 pg/mL (ref 232–1245)

## 2017-03-29 DIAGNOSIS — I10 Essential (primary) hypertension: Secondary | ICD-10-CM

## 2017-04-01 MED FILL — CARVEDILOL 6.25 MG TABLET: 6.25 | 30 days supply | Qty: 60 | Fill #6

## 2017-04-02 ENCOUNTER — Ambulatory Visit: Payer: Medicaid Other | Admitting: Internal Medicine

## 2017-04-02 ENCOUNTER — Encounter (HOSPITAL_COMMUNITY): Payer: Self-pay

## 2017-04-02 ENCOUNTER — Emergency Department (HOSPITAL_COMMUNITY): Payer: Medicaid Other

## 2017-04-02 ENCOUNTER — Emergency Department (HOSPITAL_COMMUNITY)
Admission: EM | Admit: 2017-04-02 | Discharge: 2017-04-02 | Disposition: A | Discharge status: Home / Self Care | Payer: Medicaid Other | Attending: Emergency Medicine | Admitting: Emergency Medicine

## 2017-04-02 DIAGNOSIS — Z794 Long term (current) use of insulin: Secondary | ICD-10-CM | POA: Insufficient documentation

## 2017-04-02 DIAGNOSIS — Z7982 Long term (current) use of aspirin: Secondary | ICD-10-CM | POA: Diagnosis not present

## 2017-04-02 DIAGNOSIS — E119 Type 2 diabetes mellitus without complications: Secondary | ICD-10-CM | POA: Diagnosis not present

## 2017-04-02 DIAGNOSIS — I110 Hypertensive heart disease with heart failure: Secondary | ICD-10-CM | POA: Diagnosis not present

## 2017-04-02 DIAGNOSIS — Z79899 Other long term (current) drug therapy: Secondary | ICD-10-CM | POA: Insufficient documentation

## 2017-04-02 DIAGNOSIS — B9789 Other viral agents as the cause of diseases classified elsewhere: Secondary | ICD-10-CM | POA: Diagnosis not present

## 2017-04-02 DIAGNOSIS — J069 Acute upper respiratory infection, unspecified: Secondary | ICD-10-CM | POA: Insufficient documentation

## 2017-04-02 DIAGNOSIS — I5043 Acute on chronic combined systolic (congestive) and diastolic (congestive) heart failure: Secondary | ICD-10-CM | POA: Diagnosis not present

## 2017-04-02 DIAGNOSIS — R05 Cough: Secondary | ICD-10-CM | POA: Diagnosis present

## 2017-04-02 DIAGNOSIS — H6501 Acute serous otitis media, right ear: Secondary | ICD-10-CM | POA: Diagnosis not present

## 2017-04-02 MED ORDER — AMOXICILLIN 500 MG PO CAPS: 500.0000 mg | ORAL_CAPSULE | Freq: Two times a day (BID) | ORAL | 0 refills | Status: AC

## 2017-04-02 MED ORDER — BENZONATATE 100 MG PO CAPS
100.0000 mg | ORAL_CAPSULE | Freq: Once | ORAL | Status: AC
  Administered 2017-04-02: 100 mg via ORAL
  Filled 2017-04-02: qty 1

## 2017-04-02 MED ORDER — AMOXICILLIN 500 MG PO CAPS
500.0000 mg | ORAL_CAPSULE | Freq: Once | ORAL | Status: AC
Start: 2017-04-02 — End: 2017-04-02
  Administered 2017-04-02: 500 mg via ORAL
  Filled 2017-04-02: qty 1

## 2017-04-02 MED ORDER — ALBUTEROL SULFATE (2.5 MG/3ML) 0.083% IN NEBU
5.0000 mg | INHALATION_SOLUTION | Freq: Once | RESPIRATORY_TRACT | Status: AC
  Administered 2017-04-02: 5 mg via RESPIRATORY_TRACT
  Filled 2017-04-02: qty 6

## 2017-04-02 MED ORDER — PREDNISONE 20 MG PO TABS
60.0000 mg | ORAL_TABLET | Freq: Once | ORAL | Status: AC
  Administered 2017-04-02: 60 mg via ORAL
  Filled 2017-04-02: qty 3

## 2017-04-02 MED ORDER — PREDNISONE 20 MG PO TABS: ORAL_TABLET | ORAL | 0 refills | Status: DC

## 2017-04-02 MED ORDER — BENZONATATE 100 MG PO CAPS: 100.0000 mg | ORAL_CAPSULE | Freq: Three times a day (TID) | ORAL | 0 refills | Status: DC

## 2017-04-02 NOTE — ED Provider Notes (Signed)
[facility] DEPT Provider Note   CSN: [id number] Arrival date & time: 04/02/17  0448     History   Chief Complaint Chief Complaint  Patient presents with  . Cough    HPI [name] is a 52 y.o. female.  52 yo F with a chief complaint of cough.  This been going on for the past couple weeks.  She feels that it is not gotten any better.  She recently was diagnosed with heart failure with an EF of about 20%.  She has a follow-up appointment scheduled to see cardiology for possible pacemaker defibrillator placement.  She denies any worsening leg swelling denies orthopnea or PND.  She has had some worsening coughing when she lies back flat.  Denies sick contacts.  Denies vomiting or diarrhea.  Normal oral intake.   The history is provided by the patient.  Cough  This is a new problem. The current episode started more than 1 week ago. The problem occurs constantly. The problem has been rapidly worsening. The cough is non-productive. Associated symptoms include shortness of breath. Pertinent negatives include no chest pain, no chills, no headaches, no rhinorrhea, no myalgias, no wheezing and no eye redness. She has tried nothing for the symptoms. The treatment provided no relief. She is not a smoker.    Past Medical History:  Diagnosis Date  . Diabetes mellitus, type 2 (HCC)    A1C 7.6 April '13  . Hypertension   . Left leg pain 07/21/2015  . Nonischemic cardiomyopathy ([place])    Echo 05/19/11 EF 35% w/ mild-mod MR; R/L Heart Cath 05/21/11 - mildly elevated right heart pressures, normal left heart pressures, preserved cardiac output, widely patent coronary arteries without significant CAD and moderate global systolic LV dysfunction, EF 35-40%.  . 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 Systolic CHF ([place])    Echo 05/19/11 EF 35% w/ mild-mod MR    Patient Active Problem List   Diagnosis Date Noted  . Hyperlipidemia 02/21/2017    . Diabetic polyneuropathy associated with type 2 diabetes mellitus ([place]) 02/21/2017  . Lung nodules 07/16/2016  . Uncontrolled type 2 diabetes mellitus without complication, with long-term current use of insulin ([place]) 07/16/2016  . DCM (dilated cardiomyopathy) ([place])   . Acute on chronic combined systolic and diastolic CHF (congestive heart failure) ([place]) 06/10/2016  . Essential hypertension 06/10/2016  . Dyslipidemia associated with type 2 diabetes mellitus ([place]) 06/10/2016    Past Surgical History:  Procedure Laterality Date  . CERVIX LESION DESTRUCTION    . DILATION AND CURETTAGE OF UTERUS    . LEFT HEART CATHETERIZATION WITH CORONARY ANGIOGRAM N/A 05/21/2011   Procedure: LEFT HEART CATHETERIZATION WITH CORONARY ANGIOGRAM;  Surgeon: Sherren Mocha, MD;  Location: [facility] CATH LAB;  Service: Cardiovascular;  Laterality: N/A;  . TUBAL LIGATION  1996    OB History    No data available       Home Medications    Prior to Admission medications   Medication Sig Start Date End Date Taking? Authorizing Provider  albuterol (PROVENTIL HFA;VENTOLIN HFA) 108 (90 Base) MCG/ACT inhaler Inhale 1 puff into the lungs every 6 (six) hours as needed for wheezing or shortness of breath. 08/20/16   Tresa Garter, MD  amoxicillin (AMOXIL) 500 MG capsule Take 1 capsule (500 mg total) by mouth 2 (two) times daily for 7 days. 04/02/17 04/09/17  Deno Etienne, DO  aspirin 81 MG chewable tablet Chew 1 tablet (81 mg  total) by mouth daily. 06/12/16   Dessa Phi, DO  atorvastatin (LIPITOR) 80 MG tablet Take 0.5 tablets (40 mg total) by mouth daily at 6 PM. 02/21/17   Ladell Pier, MD  benzonatate (TESSALON) 100 MG capsule Take 1 capsule (100 mg total) by mouth every 8 (eight) hours. 04/02/17   Deno Etienne, DO  blood glucose meter kit and supplies Dispense based on patient and insurance preference. Use up to four times daily as directed. (FOR ICD-9 250.00, 250.01). 06/12/16   Dessa Phi, DO  Blood Glucose  Monitoring Suppl (TRUE METRIX METER) w/Device KIT Use as directed 07/03/16   Tresa Garter, MD  carvedilol (COREG) 6.25 MG tablet Take 1 tablet (6.25 mg total) by mouth 2 (two) times daily with a meal. 07/26/16   Ladell Pier, MD  furosemide (LASIX) 40 MG tablet Take 1 tablet (40 mg total) by mouth 2 (two) times daily. 07/26/16   Ladell Pier, MD  gabapentin (NEURONTIN) 300 MG capsule Take 1 capsule (300 mg total) by mouth at bedtime. 02/21/17   Ladell Pier, MD  glucose blood (TRUE METRIX BLOOD GLUCOSE TEST) test strip Use as instructed 07/03/16   Tresa Garter, MD  hydrALAZINE (APRESOLINE) 10 MG tablet Take 1 tablet (10 mg total) by mouth 3 (three) times daily. 02/21/17   Ladell Pier, MD  Insulin Glargine (LANTUS SOLOSTAR) 100 UNIT/ML Solostar Pen Inject 16 Units into the skin daily. At bedtime 02/21/17   Ladell Pier, MD  Insulin Pen Needle 31G X 5 MM MISC Use as directed 08/28/16   Ladell Pier, MD  metFORMIN (GLUCOPHAGE XR) 500 MG 24 hr tablet Take 2 tablets (1,000 mg total) by mouth 2 (two) times daily with a meal. 02/14/17   Ladell Pier, MD  nystatin cream (MYCOSTATIN) Apply to external vaginal area daily x 5 days then PRN itching 02/21/17   Ladell Pier, MD  predniSONE (DELTASONE) 20 MG tablet 2 tabs po daily x 4 days 04/02/17   Deno Etienne, DO  triamcinolone cream (KENALOG) 0.1 % Apply to external vaginal area daily x 5 days then PRN itching 02/21/17   Ladell Pier, MD  TRUEPLUS LANCETS 28G MISC Use as directed 07/03/16   Tresa Garter, MD  lisinopril (PRINIVIL,ZESTRIL) 40 MG tablet Take 1 tablet (40 mg total) by mouth daily. Patient not taking: Reported on 07/30/2015 05/05/15 07/30/15  Mercy Riding, MD  sertraline (ZOLOFT) 50 MG tablet Take 1 tablet (50 mg total) by mouth daily. Patient not taking: Reported on 07/30/2015 07/20/15 07/30/15  Mercy Riding, MD    Family History Family History  Problem Relation Age of Onset  .  Diabetes Unknown        multiple  . Heart failure Paternal Grandmother   . Breast cancer Paternal Grandmother   . Heart disease Unknown        multiple  . Breast cancer Mother     Social History Social History   Tobacco Use  . Smoking status: Never Smoker  . Smokeless tobacco: Never Used  Substance Use Topics  . Alcohol use: No  . Drug use: No     Allergies   Lisinopril; Sertraline; Tramadol; and Ibuprofen   Review of Systems Review of Systems  Constitutional: Negative for chills and fever.  HENT: Positive for congestion. Negative for rhinorrhea.   Eyes: Negative for redness and visual disturbance.  Respiratory: Positive for cough and shortness of breath. Negative for wheezing.  Cardiovascular: Negative for chest pain, palpitations and leg swelling.  Gastrointestinal: Negative for nausea and vomiting.  Genitourinary: Negative for dysuria and urgency.  Musculoskeletal: Negative for arthralgias and myalgias.  Skin: Negative for pallor and wound.  Neurological: Negative for dizziness and headaches.     Physical Exam Updated Vital Signs BP (!) 134/94 (BP Location: Left Arm)   Pulse (!) 102   Temp 98.3 F (36.8 C) (Oral)   Resp 20   LMP 03/30/2017   SpO2 97%   Physical Exam  Constitutional: She is oriented to person, place, and time. She appears well-developed and well-nourished. No distress.  HENT:  Head: Normocephalic and atraumatic.  Swollen turbinates, posterior nasal drip, no noted sinus ttp, tm with R serous effusion with bulging.  L side normal.      Eyes: EOM are normal. Pupils are equal, round, and reactive to light.  Neck: Normal range of motion. Neck supple.  Cardiovascular: Normal rate and regular rhythm. Exam reveals no gallop and no friction rub.  No murmur heard. Pulmonary/Chest: Effort normal. She has no wheezes. She has no rales.  Abdominal: Soft. She exhibits no distension. There is no tenderness.  Musculoskeletal: She exhibits no edema or  tenderness.  Neurological: She is alert and oriented to person, place, and time.  Skin: Skin is warm and dry. She is not diaphoretic.  Psychiatric: She has a normal mood and affect. Her behavior is normal.  Nursing note and vitals reviewed.    ED Treatments / Results  Labs (all labs ordered are listed, but only abnormal results are displayed) Labs Reviewed - No data to display  EKG  EKG Interpretation None       Radiology Dg Chest 2 View  Result Date: 04/02/2017 CLINICAL DATA:  Cough EXAM: CHEST  2 VIEW COMPARISON:  Chest CT 06/10/2016 and chest radiograph 06/10/2016 FINDINGS: Cardiomegaly no focal airspace consolidation or pulmonary edema. No pleural effusion or pneumothorax. IMPRESSION: Cardiomegaly without focal airspace disease.  No pulmonary edema. Electronically Signed   By: Ulyses Jarred M.D.   On: 04/02/2017 06:07    Procedures Procedures (including critical care time)  Medications Ordered in ED Medications  predniSONE (DELTASONE) tablet 60 mg (not administered)  benzonatate (TESSALON) capsule 100 mg (not administered)  amoxicillin (AMOXIL) capsule 500 mg (not administered)  albuterol (PROVENTIL) (2.5 MG/3ML) 0.083% nebulizer solution 5 mg (5 mg Nebulization Given 04/02/17 4562)     Initial Impression / Assessment and Plan / ED Course  I have reviewed the triage vital signs and the nursing notes.  Pertinent labs & imaging results that were available during my care of the patient were reviewed by me and considered in my medical decision making (see chart for details).     52 yo F with a chief complaint of cough congestion, going on for the past couple weeks.  Patient got a breathing treatment in triage and feels significantly better.  With improvement with beta-2 agonist will treat with steroids.  She does have heart failure, but no signs of heart failure exacerbation on exam.  CXR without fluid.  R otitis on exam.  Will treat.  D/c home.   10:16 AM:  I have  discussed the diagnosis/risks/treatment options with the patient and family and believe the pt to be eligible for discharge home to follow-up with PCP. We also discussed returning to the ED immediately if new or worsening sx occur. We discussed the sx which are most concerning (e.g., sudden worsening pain, fever, inability to tolerate by  mouth) that necessitate immediate return. Medications administered to the patient during their visit and any new prescriptions provided to the patient are listed below.  Medications given during this visit Medications  predniSONE (DELTASONE) tablet 60 mg (not administered)  benzonatate (TESSALON) capsule 100 mg (not administered)  amoxicillin (AMOXIL) capsule 500 mg (not administered)  albuterol (PROVENTIL) (2.5 MG/3ML) 0.083% nebulizer solution 5 mg (5 mg Nebulization Given 04/02/17 0982)     The patient appears reasonably screen and/or stabilized for discharge and I doubt any other medical condition or other [facility] requiring further screening, evaluation, or treatment in the ED at this time prior to discharge.    Final Clinical Impressions(s) / ED Diagnoses   Final diagnoses:  Viral URI with cough  Right acute serous otitis media, recurrence not specified    ED Discharge Orders        Ordered    benzonatate (TESSALON) 100 MG capsule  Every 8 hours     04/02/17 1009    predniSONE (DELTASONE) 20 MG tablet     04/02/17 1009    amoxicillin (AMOXIL) 500 MG capsule  2 times daily     04/02/17 1009       [place], DO 04/02/17 1017

## 2017-04-02 NOTE — ED Notes (Signed)
Pt states she feels "much better" 

## 2017-04-02 NOTE — Discharge Instructions (Signed)
Take tylenol 2 pills 4 times a day and motrin 4 pills 3 times a day.  Drink plenty of fluids.  Return for worsening shortness of breath, headache, confusion. Follow up with your family doctor.   

## 2017-04-08 NOTE — Progress Notes (Signed)
HPI: FU CM/CHF. Echocardiogram in April of 2013 showed an ejection fraction of 35%, moderate left ventricular enlargement, mild to moderate mitral regurgitation. HIV negative and TSH normal. Cardiac catheterization in April of 2013 showed normal coronary arteries and an ejection fraction of 35-40%. Patient was treated with medications and her cardiomyopathy was felt possibly secondary to hypertension. Echocardiogram repeated in September of 2013. Her ejection fraction was 30-35%. Patient referred to Dr. Rayann Heman for consideration of ICD but the patient decided not to pursue. Also with h/o carotid dissection healed on CTA 4/18. Chest CT 4/18 showed lung nodules and fu recommended 3-6 months. Last echo 3/18 showed EF 30-35, mild MR, mild LAE. Since last seen,  patient notes increased dyspnea on exertion.  Some cough with lying flat.  No PND.  Occasional mild pedal edema.  No chest pain, palpitations or syncope.  Current Outpatient Medications  Medication Sig Dispense Refill  . albuterol (PROVENTIL HFA;VENTOLIN HFA) 108 (90 Base) MCG/ACT inhaler Inhale 1 puff into the lungs every 6 (six) hours as needed for wheezing or shortness of breath. 54 g 3  . amoxicillin (AMOXIL) 500 MG capsule Take 1 capsule (500 mg total) by mouth 2 (two) times daily for 7 days. 14 capsule 0  . aspirin 81 MG chewable tablet Chew 1 tablet (81 mg total) by mouth daily. 30 tablet 0  . atorvastatin (LIPITOR) 80 MG tablet Take 0.5 tablets (40 mg total) by mouth daily at 6 PM. 30 tablet 11  . benzonatate (TESSALON) 100 MG capsule Take 1 capsule (100 mg total) by mouth every 8 (eight) hours. 21 capsule 0  . blood glucose meter kit and supplies Dispense based on patient and insurance preference. Use up to four times daily as directed. (FOR ICD-9 250.00, 250.01). 1 each 0  . Blood Glucose Monitoring Suppl (TRUE METRIX METER) w/Device KIT Use as directed 1 kit 0  . carvedilol (COREG) 6.25 MG tablet Take 1 tablet (6.25 mg total) by  mouth 2 (two) times daily with a meal. 60 tablet 11  . furosemide (LASIX) 40 MG tablet Take 1 tablet (40 mg total) by mouth 2 (two) times daily. 60 tablet 11  . gabapentin (NEURONTIN) 300 MG capsule Take 1 capsule (300 mg total) by mouth at bedtime. 30 capsule 3  . glucose blood (TRUE METRIX BLOOD GLUCOSE TEST) test strip Use as instructed 100 each 12  . hydrALAZINE (APRESOLINE) 10 MG tablet Take 1 tablet (10 mg total) by mouth 3 (three) times daily. 90 tablet 2  . Insulin Glargine (LANTUS SOLOSTAR) 100 UNIT/ML Solostar Pen Inject 16 Units into the skin daily. At bedtime 5 pen 5  . Insulin Pen Needle 31G X 5 MM MISC Use as directed 100 each 1  . metFORMIN (GLUCOPHAGE XR) 500 MG 24 hr tablet Take 2 tablets (1,000 mg total) by mouth 2 (two) times daily with a meal. 120 tablet 6  . nystatin cream (MYCOSTATIN) Apply to external vaginal area daily x 5 days then PRN itching 30 g 0  . predniSONE (DELTASONE) 20 MG tablet 2 tabs po daily x 4 days 8 tablet 0  . triamcinolone cream (KENALOG) 0.1 % Apply to external vaginal area daily x 5 days then PRN itching 30 g 0  . TRUEPLUS LANCETS 28G MISC Use as directed 100 each 12   No current facility-administered medications for this visit.      Past Medical History:  Diagnosis Date  . Diabetes mellitus, type 2 ([place])  A1C 7.6 April '13  . Hypertension   . Left leg pain 07/21/2015  . Nonischemic cardiomyopathy ([place])    Echo 05/19/11 EF 35% w/ mild-mod MR; R/L Heart Cath 05/21/11 - mildly elevated right heart pressures, normal left heart pressures, preserved cardiac output, widely patent coronary arteries without significant CAD and moderate global systolic LV dysfunction, EF 35-40%.  . 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 Systolic CHF ([place])    Echo 05/19/11 EF 35% w/ mild-mod MR    Past Surgical History:  Procedure Laterality Date  . CERVIX LESION DESTRUCTION    . DILATION AND CURETTAGE OF UTERUS    . LEFT HEART  CATHETERIZATION WITH CORONARY ANGIOGRAM N/A 05/21/2011   Procedure: LEFT HEART CATHETERIZATION WITH CORONARY ANGIOGRAM;  Surgeon: Sherren Mocha, MD;  Location: [facility] CATH LAB;  Service: Cardiovascular;  Laterality: N/A;  . TUBAL LIGATION  1996    Social History   Socioeconomic History  . Marital status: Single    Spouse name: Not on file  . Number of children: 2  . Years of education: Not on file  . Highest education level: Not on file  Social Needs  . Financial resource strain: Not on file  . Food insecurity - worry: Not on file  . Food insecurity - inability: Not on file  . Transportation needs - medical: Not on file  . Transportation needs - non-medical: Not on file  Occupational History  . Not on file  Tobacco Use  . Smoking status: Never Smoker  . Smokeless tobacco: Never Used  Substance and Sexual Activity  . Alcohol use: No  . Drug use: No  . Sexual activity: Yes    Birth control/protection: None  Other Topics Concern  . Not on file  Social History Narrative   Lives in [place].  Presently unemployed but attends school full time    Family History  Problem Relation Age of Onset  . Diabetes Unknown        multiple  . Heart failure Paternal Grandmother   . Breast cancer Paternal Grandmother   . Heart disease Unknown        multiple  . Breast cancer Mother     ROS: no fevers or chills, productive cough, hemoptysis, dysphasia, odynophagia, melena, hematochezia, dysuria, hematuria, rash, seizure activity, orthopnea, PND, pedal edema, claudication. Remaining systems are negative.  Physical Exam: Well-developed well-nourished in no acute distress.  Skin is warm and dry.  HEENT is normal.  Neck is supple.  Chest is clear to auscultation with normal expansion.  Cardiovascular exam is regular rate and rhythm.  Positive gallop Abdominal exam nontender or distended. No masses palpated. Extremities show no edema. neuro grossly intact  ECG- NSR, NSST, prolonged QT;  personally reviewed  A/P  1 cardiomyopathy-etiology unclear.  Not on an ACE inhibitor or ARB because of history of angioedema.  Continue carvedilol.  Continue hydralazine.  Add isosorbide 30 mg daily.    She will return in 4-6 weeks to see titrate medications.  I would likely advance carvedilol to 12.5 mg twice daily if blood pressure allows.  Likely repeat echocardiogram in 3-4 months after medications titrated.  If ejection fraction less than 35% she states she would now consider ICD.  2 chronic systolic congestive heart failure-patient  is describing  mild CHF symptoms. Continue present dose of Lasix.  Add Spironolactone 25 mg daily.  Check potassium and renal function in 1 week.  Continue fluid restriction and low-sodium diet.  3 hypertension-blood pressure borderline.  Adjust medications as outlined and follow.  4 history of left common carotid artery dissection-healed on most recent CT scan.  5 lung nodule-schedule follow-up noncontrast chest CT.  6 history of noncompliance  Kirk Ruths, MD

## 2017-04-09 ENCOUNTER — Ambulatory Visit: Payer: Medicaid Other | Admitting: Cardiology

## 2017-04-09 ENCOUNTER — Encounter: Payer: Self-pay | Admitting: Cardiology

## 2017-04-09 VITALS — BP 134/90 | HR 100 | Ht 69.0 in | Wt 216.0 lb

## 2017-04-09 DIAGNOSIS — I10 Essential (primary) hypertension: Secondary | ICD-10-CM

## 2017-04-09 DIAGNOSIS — R918 Other nonspecific abnormal finding of lung field: Secondary | ICD-10-CM | POA: Diagnosis not present

## 2017-04-09 DIAGNOSIS — I428 Other cardiomyopathies: Secondary | ICD-10-CM

## 2017-04-09 DIAGNOSIS — I5023 Acute on chronic systolic (congestive) heart failure: Secondary | ICD-10-CM | POA: Diagnosis not present

## 2017-04-09 MED ORDER — SPIRONOLACTONE 25 MG PO TABS: 25.0000 mg | ORAL_TABLET | Freq: Every day | ORAL | 3 refills | Status: DC

## 2017-04-09 MED ORDER — ISOSORBIDE MONONITRATE ER 30 MG PO TB24: 30.0000 mg | ORAL_TABLET | Freq: Every day | ORAL | 3 refills | Status: DC

## 2017-04-09 NOTE — Patient Instructions (Signed)
Medication Instructions:   START SPIRONOLACTONE 25 MG ONCE DAILY  START ISOSORBIDE 30 MG ONCE DAILY=TAKE 1/2 TABLET FOR THE FIRST 3 DAYS   Labwork:  Your physician recommends that you return for lab work in: ONE WEEK  Testing/Procedures:  CT OF THE CHEST WITHOUT TO FOLLOW UP ON LUNG NODULE AT THE [address] LOCATION  Follow-Up:  Your physician recommends that you schedule a follow-up appointment in: 4-6 WEEKS WITH APP  Your physician recommends that you schedule a follow-up appointment in: 3 MONTHS WITH DR Jens Som    If you need a refill on your cardiac medications before your next appointment, please call your pharmacy.

## 2017-04-17 ENCOUNTER — Other Ambulatory Visit: Payer: Medicaid Other

## 2017-04-17 ENCOUNTER — Inpatient Hospital Stay: Admission: RE | Admit: 2017-04-17 | Payer: Medicaid Other | Source: Ambulatory Visit

## 2017-04-24 ENCOUNTER — Ambulatory Visit (INDEPENDENT_AMBULATORY_CARE_PROVIDER_SITE_OTHER)
Admission: RE | Admit: 2017-04-24 | Discharge: 2017-04-24 | Disposition: A | Discharge status: Home / Self Care | Payer: Medicaid Other | Source: Ambulatory Visit | Attending: Cardiology | Admitting: Cardiology

## 2017-04-24 ENCOUNTER — Other Ambulatory Visit: Payer: Medicaid Other

## 2017-04-24 DIAGNOSIS — R918 Other nonspecific abnormal finding of lung field: Secondary | ICD-10-CM | POA: Diagnosis not present

## 2017-04-24 LAB — BASIC METABOLIC PANEL
BUN / CREAT RATIO: 15 (ref 9–23)
BUN: 14 mg/dL (ref 6–24)
CO2: 23 mmol/L (ref 20–29)
Calcium: 9.3 mg/dL (ref 8.7–10.2)
Chloride: 99 mmol/L (ref 96–106)
Creatinine, Ser: 0.95 mg/dL (ref 0.57–1.00)
GFR calc Af Amer: 81 mL/min/{1.73_m2} (ref >59)
GFR calc non Af Amer: 71 mL/min/{1.73_m2} (ref >59)
GLUCOSE: 373 mg/dL — AB (ref 65–99)
Potassium: 4.5 mmol/L (ref 3.5–5.2)
SODIUM: 135 mmol/L (ref 134–144)

## 2017-04-30 MED FILL — LANTUS SOLOSTAR 100 UNITS/M: 100 | 30 days supply | Qty: 6 | Fill #1

## 2017-05-07 ENCOUNTER — Other Ambulatory Visit: Payer: Self-pay | Admitting: Internal Medicine

## 2017-05-07 MED FILL — TRUEPLUS PEN NDL 31G X 1/4": 31G X 6 MM | 25 days supply | Qty: 100 | Fill #0

## 2017-05-07 MED FILL — TRUEPLUS PEN NDL 31G X 1/4: 31G X 6 MM | 25 days supply | Qty: 100 | Fill #0

## 2017-05-14 ENCOUNTER — Ambulatory Visit: Payer: Medicaid Other | Admitting: Physician Assistant

## 2017-05-31 ENCOUNTER — Other Ambulatory Visit: Payer: Self-pay

## 2017-05-31 ENCOUNTER — Encounter (HOSPITAL_COMMUNITY): Payer: Self-pay

## 2017-05-31 ENCOUNTER — Inpatient Hospital Stay (HOSPITAL_COMMUNITY)
Admission: EM | Admit: 2017-05-31 | Discharge: 2017-06-02 | DRG: 194 | Disposition: A | Discharge status: Home / Self Care | Payer: Medicaid Other | Attending: Internal Medicine | Admitting: Internal Medicine

## 2017-05-31 ENCOUNTER — Inpatient Hospital Stay (HOSPITAL_COMMUNITY): Payer: Medicaid Other

## 2017-05-31 ENCOUNTER — Emergency Department (HOSPITAL_COMMUNITY): Payer: Medicaid Other

## 2017-05-31 DIAGNOSIS — I110 Hypertensive heart disease with heart failure: Secondary | ICD-10-CM | POA: Diagnosis present

## 2017-05-31 DIAGNOSIS — I420 Dilated cardiomyopathy: Secondary | ICD-10-CM | POA: Diagnosis present

## 2017-05-31 DIAGNOSIS — R0682 Tachypnea, not elsewhere classified: Secondary | ICD-10-CM | POA: Diagnosis present

## 2017-05-31 DIAGNOSIS — E1165 Type 2 diabetes mellitus with hyperglycemia: Secondary | ICD-10-CM | POA: Diagnosis not present

## 2017-05-31 DIAGNOSIS — Y92230 Patient room in hospital as the place of occurrence of the external cause: Secondary | ICD-10-CM | POA: Diagnosis not present

## 2017-05-31 DIAGNOSIS — I517 Cardiomegaly: Secondary | ICD-10-CM | POA: Diagnosis not present

## 2017-05-31 DIAGNOSIS — E785 Hyperlipidemia, unspecified: Secondary | ICD-10-CM | POA: Diagnosis present

## 2017-05-31 DIAGNOSIS — T380X5A Adverse effect of glucocorticoids and synthetic analogues, initial encounter: Secondary | ICD-10-CM | POA: Diagnosis not present

## 2017-05-31 DIAGNOSIS — J181 Lobar pneumonia, unspecified organism: Secondary | ICD-10-CM | POA: Diagnosis not present

## 2017-05-31 DIAGNOSIS — R042 Hemoptysis: Secondary | ICD-10-CM | POA: Diagnosis present

## 2017-05-31 DIAGNOSIS — Z794 Long term (current) use of insulin: Secondary | ICD-10-CM

## 2017-05-31 DIAGNOSIS — Z8249 Family history of ischemic heart disease and other diseases of the circulatory system: Secondary | ICD-10-CM | POA: Diagnosis not present

## 2017-05-31 DIAGNOSIS — R000 Tachycardia, unspecified: Secondary | ICD-10-CM | POA: Diagnosis present

## 2017-05-31 DIAGNOSIS — E860 Dehydration: Secondary | ICD-10-CM | POA: Diagnosis present

## 2017-05-31 DIAGNOSIS — I10 Essential (primary) hypertension: Secondary | ICD-10-CM

## 2017-05-31 DIAGNOSIS — Z803 Family history of malignant neoplasm of breast: Secondary | ICD-10-CM | POA: Diagnosis not present

## 2017-05-31 DIAGNOSIS — T783XXA Angioneurotic edema, initial encounter: Secondary | ICD-10-CM | POA: Diagnosis present

## 2017-05-31 DIAGNOSIS — J45909 Unspecified asthma, uncomplicated: Secondary | ICD-10-CM | POA: Diagnosis present

## 2017-05-31 DIAGNOSIS — Z9851 Tubal ligation status: Secondary | ICD-10-CM | POA: Diagnosis not present

## 2017-05-31 DIAGNOSIS — X58XXXA Exposure to other specified factors, initial encounter: Secondary | ICD-10-CM | POA: Diagnosis present

## 2017-05-31 DIAGNOSIS — J189 Pneumonia, unspecified organism: Principal | ICD-10-CM | POA: Diagnosis present

## 2017-05-31 DIAGNOSIS — E1159 Type 2 diabetes mellitus with other circulatory complications: Secondary | ICD-10-CM | POA: Diagnosis present

## 2017-05-31 DIAGNOSIS — J4520 Mild intermittent asthma, uncomplicated: Secondary | ICD-10-CM | POA: Diagnosis not present

## 2017-05-31 DIAGNOSIS — Z8673 Personal history of transient ischemic attack (TIA), and cerebral infarction without residual deficits: Secondary | ICD-10-CM

## 2017-05-31 DIAGNOSIS — I152 Hypertension secondary to endocrine disorders: Secondary | ICD-10-CM | POA: Diagnosis present

## 2017-05-31 DIAGNOSIS — I340 Nonrheumatic mitral (valve) insufficiency: Secondary | ICD-10-CM | POA: Diagnosis not present

## 2017-05-31 DIAGNOSIS — E118 Type 2 diabetes mellitus with unspecified complications: Secondary | ICD-10-CM

## 2017-05-31 DIAGNOSIS — I5042 Chronic combined systolic (congestive) and diastolic (congestive) heart failure: Secondary | ICD-10-CM | POA: Diagnosis present

## 2017-05-31 DIAGNOSIS — E1169 Type 2 diabetes mellitus with other specified complication: Secondary | ICD-10-CM | POA: Diagnosis present

## 2017-05-31 DIAGNOSIS — E1142 Type 2 diabetes mellitus with diabetic polyneuropathy: Secondary | ICD-10-CM | POA: Diagnosis present

## 2017-05-31 DIAGNOSIS — IMO0001 Reserved for inherently not codable concepts without codable children: Secondary | ICD-10-CM

## 2017-05-31 DIAGNOSIS — IMO0002 Reserved for concepts with insufficient information to code with codable children: Secondary | ICD-10-CM

## 2017-05-31 HISTORY — DX: Unspecified asthma, uncomplicated: J45.909

## 2017-05-31 LAB — I-STAT TROPONIN, ED: Troponin i, poc: 0 ng/mL (ref 0.00–0.08)

## 2017-05-31 LAB — CBC
HEMATOCRIT: 38 % (ref 36.0–46.0)
HEMOGLOBIN: 12.8 g/dL (ref 12.0–15.0)
MCH: 28.5 pg (ref 26.0–34.0)
MCHC: 33.7 g/dL (ref 30.0–36.0)
MCV: 84.6 fL (ref 78.0–100.0)
Platelets: 429 10*3/uL — ABNORMAL HIGH (ref 150–400)
RBC: 4.49 MIL/uL (ref 3.87–5.11)
RDW: 13.6 % (ref 11.5–15.5)
WBC: 18.6 10*3/uL — AB (ref 4.0–10.5)

## 2017-05-31 LAB — BASIC METABOLIC PANEL
ANION GAP: 13 (ref 5–15)
BUN: 11 mg/dL (ref 6–20)
CHLORIDE: 102 mmol/L (ref 101–111)
CO2: 19 mmol/L — ABNORMAL LOW (ref 22–32)
Calcium: 8.9 mg/dL (ref 8.9–10.3)
Creatinine, Ser: 1.01 mg/dL — ABNORMAL HIGH (ref 0.44–1.00)
GFR calc Af Amer: >60 mL/min (ref >60)
Glucose, Bld: 359 mg/dL — ABNORMAL HIGH (ref 65–99)
POTASSIUM: 4.2 mmol/L (ref 3.5–5.1)
SODIUM: 134 mmol/L — AB (ref 135–145)

## 2017-05-31 LAB — I-STAT BETA HCG BLOOD, ED (MC, WL, AP ONLY): HCG, QUANTITATIVE: 5.7 m[IU]/mL — AB (ref <5)

## 2017-05-31 MED ORDER — BENZONATATE 100 MG PO CAPS
100.0000 mg | ORAL_CAPSULE | Freq: Once | ORAL | Status: AC
  Administered 2017-05-31: 100 mg via ORAL
  Filled 2017-05-31: qty 1

## 2017-05-31 MED ORDER — SODIUM CHLORIDE 0.9 % IV SOLN
1.0000 g | Freq: Once | INTRAVENOUS | Status: AC
  Administered 2017-05-31: 1 g via INTRAVENOUS
  Filled 2017-05-31: qty 10

## 2017-05-31 MED ORDER — IOPAMIDOL (ISOVUE-370) INJECTION 76%
INTRAVENOUS | Status: AC
  Filled 2017-05-31: qty 100

## 2017-05-31 MED ORDER — IPRATROPIUM-ALBUTEROL 0.5-2.5 (3) MG/3ML IN SOLN
3.0000 mL | Freq: Once | RESPIRATORY_TRACT | Status: AC
  Administered 2017-05-31: 3 mL via RESPIRATORY_TRACT
  Filled 2017-05-31: qty 3

## 2017-05-31 MED ORDER — AZITHROMYCIN 250 MG PO TABS
500.0000 mg | ORAL_TABLET | Freq: Once | ORAL | Status: AC
  Administered 2017-05-31: 500 mg via ORAL
  Filled 2017-05-31: qty 2

## 2017-05-31 MED ORDER — SODIUM CHLORIDE 0.9 % IV BOLUS
1000.0000 mL | Freq: Once | INTRAVENOUS | Status: AC
  Administered 2017-05-31: 1000 mL via INTRAVENOUS

## 2017-05-31 MED ORDER — METHYLPREDNISOLONE SODIUM SUCC 125 MG IJ SOLR
125.0000 mg | Freq: Once | INTRAMUSCULAR | Status: AC
  Administered 2017-05-31: 125 mg via INTRAVENOUS
  Filled 2017-05-31: qty 2

## 2017-05-31 MED ORDER — INSULIN ASPART 100 UNIT/ML ~~LOC~~ SOLN
6.0000 [IU] | Freq: Once | SUBCUTANEOUS | Status: AC
  Administered 2017-05-31: 6 [IU] via SUBCUTANEOUS
  Filled 2017-05-31: qty 1

## 2017-05-31 MED ORDER — IOPAMIDOL (ISOVUE-370) INJECTION 76%
100.0000 mL | Freq: Once | INTRAVENOUS | Status: AC | PRN
  Administered 2017-05-31: 100 mL via INTRAVENOUS

## 2017-05-31 NOTE — ED Triage Notes (Signed)
Pt reports that she has had a cough for 2 days now and her albuterol inhaler is not helping. She also reports hemoptysis with her cough. She endorses chest pain that she attributes to coughing. Pt also reports L foot swelling. Able to speak in complete sentences.  A&Ox4. Ambulatory.

## 2017-05-31 NOTE — ED Notes (Signed)
Will do ekg when pt returns from XR

## 2017-05-31 NOTE — ED Provider Notes (Signed)
[facility] DEPT Provider Note   CSN: [id number] Arrival date & time: 05/31/17  1339     History   Chief Complaint Chief Complaint  Patient presents with  . Chest Pain  . Hemoptysis    HPI [name] is a 52 y.o. female.  HPI Pt started having a cough a couple of days ago.  She has been coughing a lot and her chest hurts when she coughs.  She also started coughing up some blood in her sputum.   The pain in her chest is in the middle, and the pain is sharp.  No fevers.  No hx of DVT or PE. Past Medical History:  Diagnosis Date  . Diabetes mellitus, type 2 (HCC)    A1C 7.6 April '13  . Hypertension   . Left leg pain 07/21/2015  . Nonischemic cardiomyopathy ([place])    Echo 05/19/11 EF 35% w/ mild-mod MR; R/L Heart Cath 05/21/11 - mildly elevated right heart pressures, normal left heart pressures, preserved cardiac output, widely patent coronary arteries without significant CAD and moderate global systolic LV dysfunction, EF 35-40%.  . 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 Systolic CHF ([place])    Echo 05/19/11 EF 35% w/ mild-mod MR    Patient Active Problem List   Diagnosis Date Noted  . Hyperlipidemia 02/21/2017  . Diabetic polyneuropathy associated with type 2 diabetes mellitus ([place]) 02/21/2017  . Lung nodules 07/16/2016  . Uncontrolled type 2 diabetes mellitus without complication, with long-term current use of insulin ([place]) 07/16/2016  . DCM (dilated cardiomyopathy) ([place])   . Acute on chronic combined systolic and diastolic CHF (congestive heart failure) ([place]) 06/10/2016  . Essential hypertension 06/10/2016  . Dyslipidemia associated with type 2 diabetes mellitus ([place]) 06/10/2016    Past Surgical History:  Procedure Laterality Date  . CERVIX LESION DESTRUCTION    . DILATION AND CURETTAGE OF UTERUS    . LEFT HEART CATHETERIZATION WITH CORONARY ANGIOGRAM N/A 05/21/2011   Procedure: LEFT HEART CATHETERIZATION  WITH CORONARY ANGIOGRAM;  Surgeon: Sherren Mocha, MD;  Location: [facility] CATH LAB;  Service: Cardiovascular;  Laterality: N/A;  . TUBAL LIGATION  1996     OB History   None      Home Medications    Prior to Admission medications   Medication Sig Start Date End Date Taking? Authorizing Provider  albuterol (PROVENTIL HFA;VENTOLIN HFA) 108 (90 Base) MCG/ACT inhaler Inhale 1 puff into the lungs every 6 (six) hours as needed for wheezing or shortness of breath. 08/20/16  Yes Tresa Garter, MD  aspirin 81 MG chewable tablet Chew 1 tablet (81 mg total) by mouth daily. 06/12/16  Yes Dessa Phi, DO  atorvastatin (LIPITOR) 80 MG tablet Take 0.5 tablets (40 mg total) by mouth daily at 6 PM. 02/21/17  Yes Ladell Pier, MD  carvedilol (COREG) 6.25 MG tablet Take 1 tablet (6.25 mg total) by mouth 2 (two) times daily with a meal. 07/26/16  Yes Ladell Pier, MD  furosemide (LASIX) 40 MG tablet Take 1 tablet (40 mg total) by mouth 2 (two) times daily. 07/26/16  Yes Ladell Pier, MD  gabapentin (NEURONTIN) 300 MG capsule Take 1 capsule (300 mg total) by mouth at bedtime. 02/21/17  Yes Ladell Pier, MD  hydrALAZINE (APRESOLINE) 10 MG tablet Take 1 tablet (10 mg total) by mouth 3 (three) times daily. 02/21/17  Yes Ladell Pier, MD  Insulin Glargine (LANTUS SOLOSTAR) 100 UNIT/ML Solostar Pen  Inject 16 Units into the skin daily. At bedtime 02/21/17  Yes Ladell Pier, MD  metFORMIN (GLUCOPHAGE XR) 500 MG 24 hr tablet Take 2 tablets (1,000 mg total) by mouth 2 (two) times daily with a meal. 02/14/17  Yes Ladell Pier, MD  benzonatate (TESSALON) 100 MG capsule Take 1 capsule (100 mg total) by mouth every 8 (eight) hours. Patient not taking: Reported on 05/31/2017 04/02/17   Deno Etienne, DO  blood glucose meter kit and supplies Dispense based on patient and insurance preference. Use up to four times daily as directed. (FOR ICD-9 250.00, 250.01). 06/12/16   Dessa Phi, DO  Blood  Glucose Monitoring Suppl (TRUE METRIX METER) w/Device KIT Use as directed 07/03/16   Tresa Garter, MD  glucose blood (TRUE METRIX BLOOD GLUCOSE TEST) test strip Use as instructed 07/03/16   Tresa Garter, MD  isosorbide mononitrate (IMDUR) 30 MG 24 hr tablet Take 1 tablet (30 mg total) by mouth daily. Patient not taking: Reported on 05/31/2017 04/09/17 07/08/17  Lelon Perla, MD  nystatin cream (MYCOSTATIN) Apply to external vaginal area daily x 5 days then PRN itching Patient not taking: Reported on 05/31/2017 02/21/17   Ladell Pier, MD  predniSONE (DELTASONE) 20 MG tablet 2 tabs po daily x 4 days Patient not taking: Reported on 05/31/2017 04/02/17   Deno Etienne, DO  spironolactone (ALDACTONE) 25 MG tablet Take 1 tablet (25 mg total) by mouth daily. Patient not taking: Reported on 05/31/2017 04/09/17 07/08/17  Lelon Perla, MD  triamcinolone cream (KENALOG) 0.1 % Apply to external vaginal area daily x 5 days then PRN itching Patient not taking: Reported on 05/31/2017 02/21/17   Ladell Pier, MD  TRUEPLUS LANCETS 28G MISC Use as directed 07/03/16   Tresa Garter, MD  TRUEPLUS PEN NEEDLES 31G X 6 MM MISC USE AS DIRECTED 05/07/17   Ladell Pier, MD    Family History Family History  Problem Relation Age of Onset  . Diabetes Unknown        multiple  . Heart failure Paternal Grandmother   . Breast cancer Paternal Grandmother   . Heart disease Unknown        multiple  . Breast cancer Mother     Social History Social History   Tobacco Use  . Smoking status: Never Smoker  . Smokeless tobacco: Never Used  Substance Use Topics  . Alcohol use: No  . Drug use: No     Allergies   Lisinopril; Sertraline; Tramadol; and Ibuprofen   Review of Systems Review of Systems  All other systems reviewed and are negative.    Physical Exam Updated Vital Signs BP (!) 144/99   Pulse (!) 114   Temp 97.7 F (36.5 C) (Oral)   Resp (!) 44   LMP 05/24/2017    SpO2 100%   Physical Exam  Constitutional: She appears well-developed and well-nourished. No distress.  HENT:  Head: Normocephalic and atraumatic.  Right Ear: External ear normal.  Left Ear: External ear normal.  Eyes: Conjunctivae are normal. Right eye exhibits no discharge. Left eye exhibits no discharge. No scleral icterus.  Neck: Neck supple. No tracheal deviation present.  Cardiovascular: Regular rhythm and intact distal pulses. Tachycardia present.  Pulmonary/Chest: Effort normal and breath sounds normal. No stridor. No respiratory distress. She has no wheezes. She has no rales.  Abdominal: Soft. Bowel sounds are normal. She exhibits no distension. There is no tenderness. There is no rebound and no guarding.  Musculoskeletal: She exhibits no tenderness.       Right lower leg: She exhibits edema.       Left lower leg: She exhibits edema.  Mild edema in the ankles, no calf ttp  Neurological: She is alert. She has normal strength. No cranial nerve deficit (no facial droop, extraocular movements intact, no slurred speech) or sensory deficit. She exhibits normal muscle tone. She displays no seizure activity. Coordination normal.  Skin: Skin is warm and dry. No rash noted.  Psychiatric: She has a normal mood and affect.  Nursing note and vitals reviewed.    ED Treatments / Results  Labs (all labs ordered are listed, but only abnormal results are displayed) Labs Reviewed  BASIC METABOLIC PANEL - Abnormal; Notable for the following components:      Result Value   Sodium 134 (*)    CO2 19 (*)    Glucose, Bld 359 (*)    Creatinine, Ser 1.01 (*)    All other components within normal limits  CBC - Abnormal; Notable for the following components:   WBC 18.6 (*)    Platelets 429 (*)    All other components within normal limits  I-STAT BETA HCG BLOOD, ED ([facility], [facility], [facility] ONLY) - Abnormal; Notable for the following components:   I-stat hCG, quantitative 5.7 (*)    All other components within  normal limits  I-STAT TROPONIN, ED    EKG EKG Interpretation  Date/Time:  Friday May 31 2017 14:06:31 EDT Ventricular Rate:  117 PR Interval:    QRS Duration: 68 QT Interval:  320 QTC Calculation: 447 R Axis:   74 Text Interpretation:  Sinus tachycardia Borderline T abnormalities, diffuse leads No significant change since last tracing Confirmed by Dorie Rank [phone]) on 05/31/2017 8:56:09 PM   Radiology Dg Chest 2 View  Result Date: 05/31/2017 CLINICAL DATA:  Chest pain cough and short of breath EXAM: CHEST - 2 VIEW COMPARISON:  04/02/2017 FINDINGS: Patchy right lower lobe infiltrate is new. Left lung clear. Cardiac enlargement without heart failure or effusion. IMPRESSION: Moderately large infiltrate on the right, most likely pneumonia. Electronically Signed   By: Franchot Gallo M.D.   On: 05/31/2017 14:02    Procedures Procedures (including critical care time)  Medications Ordered in ED Medications  ipratropium-albuterol (DUONEB) 0.5-2.5 (3) MG/3ML nebulizer solution 3 mL (has no administration in time range)  methylPREDNISolone sodium succinate (SOLU-MEDROL) 125 mg/2 mL injection 125 mg (has no administration in time range)  cefTRIAXone (ROCEPHIN) 1 g in sodium chloride 0.9 % 100 mL IVPB (1 g Intravenous New Bag/Given 05/31/17 1944)  azithromycin (ZITHROMAX) tablet 500 mg (500 mg Oral Given 05/31/17 1946)  sodium chloride 0.9 % bolus 1,000 mL (1,000 mLs Intravenous New Bag/Given 05/31/17 1948)  insulin aspart (novoLOG) injection 6 Units (6 Units Subcutaneous Given 05/31/17 1948)     Initial Impression / Assessment and Plan / ED Course  I have reviewed the triage vital signs and the nursing notes.  Pertinent labs & imaging results that were available during my care of the patient were reviewed by me and considered in my medical decision making (see chart for details).  Clinical Course as of May 31 2101  Fri May 31, 2017  1854 CXR shows a pna.  Consistent with her hx.  HCG  above normal but not clinically consistent with pregnancy   [JK]  1856 Hyperglycemia noted.  Will start insulin and fluids   [JK]  2054 She did not continues to remain tachycardic.  She also remains tachypneic.  She does have history of asthma.  With her persistent abnormal vital signs despite fluids and antibiotics I will consult the medical service for admission   [JK]    Clinical Course User Index [JK] Dorie Rank, MD    Patient presented to the emergency room with complaints of cough and chest pain.  She has a history of asthma and has tried her breathing treatments at home.  Patient's chest x-ray shows a pneumonia.  She was given IV fluids and antibiotics.  She remains tachycardic and still slightly tachypneic.  I think she warrants admission for further treatment.   Final Clinical Impressions(s) / ED Diagnoses   Final diagnoses:  Community acquired pneumonia, unspecified laterality      Dorie Rank, MD 05/31/17 2103

## 2017-05-31 NOTE — ED Notes (Signed)
Pt has urine specimen in lab.

## 2017-05-31 NOTE — H&P (Signed)
[name] JJH:[id number] DOB: 1965/12/08 DOA: 05/31/2017     PCP: Ladell Pier, MD   Outpatient Specialists:   CARDS:   Dr. Stanford Breed   Patient arrived to ER on 05/31/17 at 1339  Patient coming from:   home Lives  With family    Chief Complaint:  Chief Complaint  Patient presents with  . Chest Pain  . Hemoptysis    HPI: [name] is a 52 y.o. female with medical history significant of DM 2, chronic combined systolic and diastolic CHF , HL, lung nodules, HTN, hx of carotid dissection, history of asthma, history of CVA in 2017     Presented with  Cough for 2 days occasionally blood tinged sputum she has tried to use albuterol but did not seem to help chest pain worse with coughing also noted left foot swelling more than right.  Reports her weight have been going up she only takes her Lasix once a day and have been forgetting, Denies sick contacts no fever.  Symptoms occurred suddenly  Chest pain is worse when she is coughing and hurts in the middle feels sharp she has not had any fevers or chills Patient has known history of lung nodules found in 2018 Regarding pertinent Chronic problems:  Hx of DM 2 on  Lantus 12 units daily and Metformin 1 gram BID history of poor compliance DCM/CHF originally diagnosed in April 2013 at that time HIV negative TSH normal undergoing cardiac catheterization showed normal coronary arteries repeat echo EF30-35% on 05/2016 she was referred for ICD but decided against it.  Here she was diagnosed with dilated cardiomyopathy she has been compliant her Coreg and Lasix but has been forgetting to take her other medications. ARB secondary to angioedema   history of left common carotid artery dissection-healed on most recent CT scan.   While in ER: Was found to be significantly tachycardic able to speak in complete sentences but appears to be not back to baseline  Following Medications were ordered in ER: Medications    ipratropium-albuterol (DUONEB) 0.5-2.5 (3) MG/3ML nebulizer solution 3 mL (has no administration in time range)  methylPREDNISolone sodium succinate (SOLU-MEDROL) 125 mg/2 mL injection 125 mg (has no administration in time range)  cefTRIAXone (ROCEPHIN) 1 g in sodium chloride 0.9 % 100 mL IVPB (0 g Intravenous Stopped 05/31/17 2119)  azithromycin (ZITHROMAX) tablet 500 mg (500 mg Oral Given 05/31/17 1946)  sodium chloride 0.9 % bolus 1,000 mL (1,000 mLs Intravenous New Bag/Given 05/31/17 1948)  insulin aspart (novoLOG) injection 6 Units (6 Units Subcutaneous Given 05/31/17 1948)    Significant initial  Findings: Abnormal Labs Reviewed  BASIC METABOLIC PANEL - Abnormal; Notable for the following components:      Result Value   Sodium 134 (*)    CO2 19 (*)    Glucose, Bld 359 (*)    Creatinine, Ser 1.01 (*)    All other components within normal limits  CBC - Abnormal; Notable for the following components:   WBC 18.6 (*)    Platelets 429 (*)    All other components within normal limits  I-STAT BETA HCG BLOOD, ED ([facility], [facility], [facility] ONLY) - Abnormal; Notable for the following components:   I-stat hCG, quantitative 5.7 (*)    All other components within normal limits    Na 134 K  Cr   stable,   Lab Results  Component Value Date   CREATININE 1.01 (H) 05/31/2017   CREATININE 0.95 04/24/2017   CREATININE 0.87 02/21/2017  HG/HCT  stable,      Component Value Date/Time   HGB 12.8 05/31/2017 1417   HGB 14.5 02/21/2017 1558   HCT 38.0 05/31/2017 1417   HCT 45.1 02/21/2017 1558       Troponin (Point of Care Test) Recent Labs    05/31/17 1423  TROPIPOC 0.00     BNP (last 3 results) Recent Labs    06/10/16 0822  BNP 534.8*    ProBNP (last 3 results) No results for input(s): PROBNP in the last 8760 hours.  Lactic Acid, Venous No results found for: LATICACIDVEN    UA  not ordered     CXR -showing right lower lobe pneumonia  CT chest- *nonacute  ECG:   Personally reviewed by me showing: HR :  117 Rhythm: Sinus tachycardia     no evidence of ischemic changes QTC 447     ED Triage Vitals  Enc Vitals Group     BP 05/31/17 1346 (!) 144/93     Pulse Rate 05/31/17 1346 (!) 117     Resp 05/31/17 1346 18     Temp 05/31/17 1346 97.7 F (36.5 C)     Temp Source 05/31/17 1346 Oral     SpO2 05/31/17 1346 100 %     Weight --      Height --      Head Circumference --      Peak Flow --      Pain Score 05/31/17 1348 6     Pain Loc --      Pain Edu? --      Excl. in [place]? --   TMAX(24)@       Latest  Blood pressure (!) 145/77, pulse (!) 121, temperature 97.7 F (36.5 C), temperature source Oral, resp. rate (!) 23, last menstrual period 05/24/2017, SpO2 100 %.   Hospitalist was called for admission for sepsis physiology and community-acquired pneumonia   Review of Systems:    Pertinent positives include:  Fatigue,  shortness of breath at rest.   dyspnea on exertion productive cough, coughing up of blood, leg  swelling Constitutional:  No weight loss, night sweats, Fevers, chills weight loss  HEENT:  No headaches, Difficulty swallowing,Tooth/dental problems,Sore throat,  No sneezing, itching, ear ache, nasal congestion, post nasal drip,  Cardio-vascular:  No chest pain, Orthopnea, PND, anasarca, dizziness, palpitations.no Bilateral lower extremity swelling  GI:  No heartburn, indigestion, abdominal pain, nausea, vomiting, diarrhea, change in bowel habits, loss of appetite, melena, blood in stool, hematemesis Resp:  no, No excess mucus, no  No non-productive cough,  .No change in color of mucus.No wheezing. Skin:  no rash or lesions. No jaundice GU:  no dysuria, change in color of urine, no urgency or frequency. No straining to urinate.  No flank pain.  Musculoskeletal:  No joint pain or no joint swelling. No decreased range of motion. No back pain.  Psych:  No change in mood or affect. No depression or anxiety. No memory loss.   Neuro: no localizing neurological complaints, no tingling, no weakness, no double vision, no gait abnormality, no slurred speech, no confusion  As per HPI otherwise 10 point review of systems negative.   Past Medical History:   Past Medical History:  Diagnosis Date  . Asthma 05/31/2017  . Diabetes mellitus, type 2 (HCC)    A1C 7.6 April '13  . Hypertension   . Left leg pain 07/21/2015  . Nonischemic cardiomyopathy ([place])    Echo 05/19/11 EF 35% w/ mild-mod  MR; R/L Heart Cath 05/21/11 - mildly elevated right heart pressures, normal left heart pressures, preserved cardiac output, widely patent coronary arteries without significant CAD and moderate global systolic LV dysfunction, EF 35-40%.  . Stroke (cerebrum) ([place]) 02/2015   right sided weakness, now resolved  . Systolic CHF ([place])    Echo 05/19/11 EF 35% w/ mild-mod MR      Past Surgical History:  Procedure Laterality Date  . CERVIX LESION DESTRUCTION    . DILATION AND CURETTAGE OF UTERUS    . LEFT HEART CATHETERIZATION WITH CORONARY ANGIOGRAM N/A 05/21/2011   Procedure: LEFT HEART CATHETERIZATION WITH CORONARY ANGIOGRAM;  Surgeon: Sherren Mocha, MD;  Location: [facility] CATH LAB;  Service: Cardiovascular;  Laterality: N/A;  . TUBAL LIGATION  1996    Social History:  Ambulatory   independently       reports that she has never smoked. She has never used smokeless tobacco. She reports that she does not drink alcohol or use drugs.     Family History:   Family History  Problem Relation Age of Onset  . Diabetes Unknown        multiple  . Heart failure Paternal Grandmother   . Breast cancer Paternal Grandmother   . Heart disease Unknown        multiple  . Breast cancer Mother     Allergies: Allergies  Allergen Reactions  . Lisinopril Hives and Swelling    Facial   . Sertraline Hives and Swelling    Facial   . Tramadol Hives and Swelling    facial  . Ibuprofen Hives and Swelling     Prior to Admission medications    Medication Sig Start Date End Date Taking? Authorizing Provider  albuterol (PROVENTIL HFA;VENTOLIN HFA) 108 (90 Base) MCG/ACT inhaler Inhale 1 puff into the lungs every 6 (six) hours as needed for wheezing or shortness of breath. 08/20/16  Yes Tresa Garter, MD  aspirin 81 MG chewable tablet Chew 1 tablet (81 mg total) by mouth daily. 06/12/16  Yes Dessa Phi, DO  atorvastatin (LIPITOR) 80 MG tablet Take 0.5 tablets (40 mg total) by mouth daily at 6 PM. 02/21/17  Yes Ladell Pier, MD  carvedilol (COREG) 6.25 MG tablet Take 1 tablet (6.25 mg total) by mouth 2 (two) times daily with a meal. 07/26/16  Yes Ladell Pier, MD  furosemide (LASIX) 40 MG tablet Take 1 tablet (40 mg total) by mouth 2 (two) times daily. 07/26/16  Yes Ladell Pier, MD  gabapentin (NEURONTIN) 300 MG capsule Take 1 capsule (300 mg total) by mouth at bedtime. 02/21/17  Yes Ladell Pier, MD  hydrALAZINE (APRESOLINE) 10 MG tablet Take 1 tablet (10 mg total) by mouth 3 (three) times daily. 02/21/17  Yes Ladell Pier, MD  Insulin Glargine (LANTUS SOLOSTAR) 100 UNIT/ML Solostar Pen Inject 16 Units into the skin daily. At bedtime 02/21/17  Yes Ladell Pier, MD  metFORMIN (GLUCOPHAGE XR) 500 MG 24 hr tablet Take 2 tablets (1,000 mg total) by mouth 2 (two) times daily with a meal. 02/14/17  Yes Ladell Pier, MD  benzonatate (TESSALON) 100 MG capsule Take 1 capsule (100 mg total) by mouth every 8 (eight) hours. Patient not taking: Reported on 05/31/2017 04/02/17   Deno Etienne, DO  blood glucose meter kit and supplies Dispense based on patient and insurance preference. Use up to four times daily as directed. (FOR ICD-9 250.00, 250.01). 06/12/16   Dessa Phi, DO  Blood Glucose Monitoring  Suppl (TRUE METRIX METER) w/Device KIT Use as directed 07/03/16   Angelica Chessman E, MD  glucose blood (TRUE METRIX BLOOD GLUCOSE TEST) test strip Use as instructed 07/03/16   Tresa Garter, MD  isosorbide  mononitrate (IMDUR) 30 MG 24 hr tablet Take 1 tablet (30 mg total) by mouth daily. Patient not taking: Reported on 05/31/2017 04/09/17 07/08/17  Lelon Perla, MD  nystatin cream (MYCOSTATIN) Apply to external vaginal area daily x 5 days then PRN itching Patient not taking: Reported on 05/31/2017 02/21/17   Ladell Pier, MD  predniSONE (DELTASONE) 20 MG tablet 2 tabs po daily x 4 days Patient not taking: Reported on 05/31/2017 04/02/17   Deno Etienne, DO  spironolactone (ALDACTONE) 25 MG tablet Take 1 tablet (25 mg total) by mouth daily. Patient not taking: Reported on 05/31/2017 04/09/17 07/08/17  Lelon Perla, MD  triamcinolone cream (KENALOG) 0.1 % Apply to external vaginal area daily x 5 days then PRN itching Patient not taking: Reported on 05/31/2017 02/21/17   Ladell Pier, MD  TRUEPLUS LANCETS 28G MISC Use as directed 07/03/16   Tresa Garter, MD  TRUEPLUS PEN NEEDLES 31G X 6 MM MISC USE AS DIRECTED 05/07/17   Ladell Pier, MD   Physical Exam: Blood pressure (!) 145/77, pulse (!) 121, temperature 97.7 F (36.5 C), temperature source Oral, resp. rate (!) 23, last menstrual period 05/24/2017, SpO2 100 %. 1. General: Increased work of breathing   Chronically ill -appearing 2. Psychological: Alert and  Oriented 3. Head/ENT:    Dry Mucous Membranes                          Head Non traumatic, neck supple                            Poor Dentition 4. SKIN: normal   Skin turgor,  Skin clean Dry and intact no rash 5. Heart: Regular rate and rhythm no Murmur, no Rub or gallop 6. Lungs:   no wheezes or crackles   7. Abdomen: Soft, non-tender, Non distended  obese   bowel sounds present 8. Lower extremities: no clubbing, cyanosis, or edema 9. Neurologically Grossly intact, moving all 4 extremities equally  10. MSK: Normal range of motion   LABS:     Recent Labs  Lab 05/31/17 1417  WBC 18.6*  HGB 12.8  HCT 38.0  MCV 84.6  PLT 258*   Basic Metabolic  Panel: Recent Labs  Lab 05/31/17 1417  NA 134*  K 4.2  CL 102  CO2 19*  GLUCOSE 359*  BUN 11  CREATININE 1.01*  CALCIUM 8.9     No results for input(s): AST, ALT, ALKPHOS, BILITOT, PROT, ALBUMIN in the last 168 hours. No results for input(s): LIPASE, AMYLASE in the last 168 hours. No results for input(s): AMMONIA in the last 168 hours.    HbA1C: No results for input(s): HGBA1C in the last 72 hours. CBG: No results for input(s): GLUCAP in the last 168 hours.    Urine analysis:    Component Value Date/Time   COLORURINE YELLOW 07/02/2007 1121   APPEARANCEUR HAZY (A) 07/02/2007 1121   LABSPEC >1.030 (H) 07/02/2007 1121   PHURINE 5.5 07/02/2007 1121   GLUCOSEU NEGATIVE 07/02/2007 1121   HGBUR LARGE (A) 07/02/2007 1121   BILIRUBINUR negative 02/21/2017 [place] 07/02/2007 1121   PROTEINUR negative 02/21/2017 1544  PROTEINUR NEGATIVE 07/02/2007 1121   UROBILINOGEN 0.2 02/21/2017 1544   UROBILINOGEN 0.2 07/02/2007 1121   NITRITE negative 02/21/2017 1544   NITRITE NEGATIVE 07/02/2007 1121   LEUKOCYTESUR Negative 02/21/2017 1544      Cultures: No results found for: SDES, SPECREQUEST, CULT, REPTSTATUS   Radiological Exams on Admission: Dg Chest 2 View  Result Date: 05/31/2017 CLINICAL DATA:  Chest pain cough and short of breath EXAM: CHEST - 2 VIEW COMPARISON:  04/02/2017 FINDINGS: Patchy right lower lobe infiltrate is new. Left lung clear. Cardiac enlargement without heart failure or effusion. IMPRESSION: Moderately large infiltrate on the right, most likely pneumonia. Electronically Signed   By: Franchot Gallo M.D.   On: 05/31/2017 14:02   Ct Angio Chest Pe W Or Wo Contrast  Result Date: 05/31/2017 CLINICAL DATA:  Cough for several days. Concern for pulmonary embolism. EXAM: CT ANGIOGRAPHY CHEST WITH CONTRAST TECHNIQUE: Multidetector CT imaging of the chest was performed using the standard protocol during bolus administration of intravenous contrast.  Multiplanar CT image reconstructions and MIPs were obtained to evaluate the vascular anatomy. CONTRAST:  177m ISOVUE-370 IOPAMIDOL (ISOVUE-370) INJECTION 76% COMPARISON:  Chest CT 04/24/2017, radiograph 05/31/2017 FINDINGS: Cardiovascular: No filling defects within the pulmonary arteries to suggest acute pulmonary embolism. No acute findings of the aorta or great vessels. No pericardial fluid. Mediastinum/Nodes: No axillary or supraclavicular adenopathy. No mediastinal hilar adenopathy. No pericardial effusion. Esophagus Lungs/Pleura: There confluent perihilar airspace disease in the RIGHT upper lobe, RIGHT middle lobe and RIGHT lower lobe. Patchy airspace disease in the LEFT upper lobe and LEFT lower lobe additionally. No pneumothorax. No pleural fluid Upper Abdomen: Limited view of the liver, kidneys, pancreas are unremarkable. Normal adrenal glands. Musculoskeletal: No aggressive osseous lesion. Review of the MIP images confirms the above findings. IMPRESSION: 1. Confluent perihilar airspace disease in the RIGHT lung to lesser degree LEFT lung is concerning for multifocal pneumonia. Flash pulmonary edema could have a similar pattern. 2. No acute pulmonary embolism. Electronically Signed   By: SSuzy BouchardM.D.   On: 05/31/2017 23:39    Chart has been reviewed    Assessment/Plan   52y.o. female with medical history significant of DM 2, chronic combined systolic and diastolic CHF , HL, lung nodules, HTN, hx of carotid dissection, history of asthma, history of CVA in 2017  Admitted for community-acquired pneumonia  Present on Admission: . CAP (community acquired pneumonia)  - will admit for treatment of CAP will start on appropriate antibiotic coverage.   Obtain:  sputum cultures,                   blood cultures if febrile or if decompensates.                   strep pneumo UA antigen,                     Provide oxygen as needed.  Patient clinically improving with nebulizer treatment .  Diabetic polyneuropathy associated with type 2 diabetes mellitus (HCC) continue Neurontin Neurontin . Dyslipidemia associated with type 2 diabetes mellitus [place] will continue statin . Essential hypertension start home medications . DCM (dilated cardiomyopathy) ([place] -unclear fluid status there is slight lower extremity edema but also patient is tachycardic and with dry mucous membranes admitted for likely community-acquired pneumonia.  For tonight hold off Lasix until can clarify her fluid status would appreciate cardiology input in this complex patient . Hemoptysis -possibly secondary to pneumonia but given  sudden onset will obtain CT of the chest neg for  PE, given increased work of breathing and mild hemoptysis monitor in stepdown . Asthma with possible mild exacerbation will order steroids, Xopenex as needed Elevated hCBG -  felt to be erroneous  Other plan as per orders.  DVT prophylaxis:  SCD    Code Status:  FULL CODE   per patient   I had personally discussed CODE STATUS with patient  Family Communication:   Family not  at  Bedside    Disposition Plan:    To home once workup is complete and patient is stable                            Consults called: email cardiology   Admission status:   Inpatient    Level of care    tele            Toy Baker 06/01/2017, 1:04 AM    [facility]  Pager [phone]   after 2 AM please page floor coverage PA If 7AM-7PM, please contact the day team taking care of the patient  [email or web address]  Password [email or web address]

## 2017-06-01 ENCOUNTER — Inpatient Hospital Stay (HOSPITAL_COMMUNITY): Payer: Medicaid Other

## 2017-06-01 DIAGNOSIS — I517 Cardiomegaly: Secondary | ICD-10-CM

## 2017-06-01 DIAGNOSIS — I420 Dilated cardiomyopathy: Secondary | ICD-10-CM

## 2017-06-01 DIAGNOSIS — R000 Tachycardia, unspecified: Secondary | ICD-10-CM

## 2017-06-01 DIAGNOSIS — J189 Pneumonia, unspecified organism: Principal | ICD-10-CM

## 2017-06-01 DIAGNOSIS — I340 Nonrheumatic mitral (valve) insufficiency: Secondary | ICD-10-CM

## 2017-06-01 LAB — COMPREHENSIVE METABOLIC PANEL
ALT: 14 U/L (ref 14–54)
ANION GAP: 13 (ref 5–15)
AST: 21 U/L (ref 15–41)
Albumin: 3.1 g/dL — ABNORMAL LOW (ref 3.5–5.0)
Alkaline Phosphatase: 77 U/L (ref 38–126)
BILIRUBIN TOTAL: 1.9 mg/dL — AB (ref 0.3–1.2)
BUN: 13 mg/dL (ref 6–20)
CO2: 20 mmol/L — ABNORMAL LOW (ref 22–32)
Calcium: 8.5 mg/dL — ABNORMAL LOW (ref 8.9–10.3)
Chloride: 103 mmol/L (ref 101–111)
Creatinine, Ser: 0.87 mg/dL (ref 0.44–1.00)
GLUCOSE: 403 mg/dL — AB (ref 65–99)
POTASSIUM: 3.9 mmol/L (ref 3.5–5.1)
Sodium: 136 mmol/L (ref 135–145)
TOTAL PROTEIN: 6.8 g/dL (ref 6.5–8.1)

## 2017-06-01 LAB — TROPONIN I
TROPONIN I: 0.06 ng/mL — AB (ref <0.03)
TROPONIN I: 0.05 ng/mL — AB (ref <0.03)
Troponin I: 0.07 ng/mL (ref <0.03)

## 2017-06-01 LAB — CBC
HEMATOCRIT: 36.4 % (ref 36.0–46.0)
HEMOGLOBIN: 11.7 g/dL — AB (ref 12.0–15.0)
MCH: 27.5 pg (ref 26.0–34.0)
MCHC: 32.1 g/dL (ref 30.0–36.0)
MCV: 85.4 fL (ref 78.0–100.0)
Platelets: 368 10*3/uL (ref 150–400)
RBC: 4.26 MIL/uL (ref 3.87–5.11)
RDW: 13.9 % (ref 11.5–15.5)
WBC: 14.5 10*3/uL — AB (ref 4.0–10.5)

## 2017-06-01 LAB — CBC WITH DIFFERENTIAL/PLATELET
BASOS PCT: 0 %
Basophils Absolute: 0 10*3/uL (ref 0.0–0.1)
EOS ABS: 0 10*3/uL (ref 0.0–0.7)
EOS PCT: 0 %
HCT: 36.9 % (ref 36.0–46.0)
HEMOGLOBIN: 11.9 g/dL — AB (ref 12.0–15.0)
Lymphocytes Relative: 5 %
Lymphs Abs: 0.8 10*3/uL (ref 0.7–4.0)
MCH: 27.5 pg (ref 26.0–34.0)
MCHC: 32.2 g/dL (ref 30.0–36.0)
MCV: 85.2 fL (ref 78.0–100.0)
Monocytes Absolute: 0.1 10*3/uL (ref 0.1–1.0)
Monocytes Relative: 1 %
NEUTROS PCT: 94 %
Neutro Abs: 14.4 10*3/uL — ABNORMAL HIGH (ref 1.7–7.7)
PLATELETS: 357 10*3/uL (ref 150–400)
RBC: 4.33 MIL/uL (ref 3.87–5.11)
RDW: 13.6 % (ref 11.5–15.5)
WBC: 15.3 10*3/uL — ABNORMAL HIGH (ref 4.0–10.5)

## 2017-06-01 LAB — ECHOCARDIOGRAM COMPLETE
Height: 69 in
Weight: 3470.4 oz

## 2017-06-01 LAB — APTT: APTT: 30 s (ref 24–36)

## 2017-06-01 LAB — PROCALCITONIN: Procalcitonin: 0.56 ng/mL

## 2017-06-01 LAB — CBG MONITORING, ED
GLUCOSE-CAPILLARY: 343 mg/dL — AB (ref 65–99)
Glucose-Capillary: 379 mg/dL — ABNORMAL HIGH (ref 65–99)
Glucose-Capillary: 461 mg/dL — ABNORMAL HIGH (ref 65–99)

## 2017-06-01 LAB — GLUCOSE, CAPILLARY
GLUCOSE-CAPILLARY: 381 mg/dL — AB (ref 65–99)
Glucose-Capillary: 482 mg/dL — ABNORMAL HIGH (ref 65–99)
Glucose-Capillary: 386 mg/dL — ABNORMAL HIGH (ref 65–99)
Glucose-Capillary: 412 mg/dL — ABNORMAL HIGH (ref 65–99)

## 2017-06-01 LAB — STREP PNEUMONIAE URINARY ANTIGEN: Strep Pneumo Urinary Antigen: NEGATIVE

## 2017-06-01 LAB — PROTIME-INR
INR: 1.3
PROTHROMBIN TIME: 16.1 s — AB (ref 11.4–15.2)

## 2017-06-01 LAB — TSH: TSH: 0.656 u[IU]/mL (ref 0.350–4.500)

## 2017-06-01 LAB — PHOSPHORUS: PHOSPHORUS: 3 mg/dL (ref 2.5–4.6)

## 2017-06-01 LAB — HIV ANTIBODY (ROUTINE TESTING W REFLEX): HIV SCREEN 4TH GENERATION: NONREACTIVE

## 2017-06-01 LAB — LACTIC ACID, PLASMA
LACTIC ACID, VENOUS: 1.9 mmol/L (ref 0.5–1.9)
Lactic Acid, Venous: 2.1 mmol/L (ref 0.5–1.9)

## 2017-06-01 LAB — HEMOGLOBIN A1C
HEMOGLOBIN A1C: 10.6 % — AB (ref 4.8–5.6)
MEAN PLASMA GLUCOSE: 257.52 mg/dL

## 2017-06-01 LAB — D-DIMER, QUANTITATIVE (NOT AT ARMC): D DIMER QUANT: 0.83 ug{FEU}/mL — AB (ref 0.00–0.50)

## 2017-06-01 LAB — MAGNESIUM: MAGNESIUM: 1.4 mg/dL — AB (ref 1.7–2.4)

## 2017-06-01 MED ORDER — LEVALBUTEROL HCL 0.63 MG/3ML IN NEBU: 0.6300 mg | INHALATION_SOLUTION | RESPIRATORY_TRACT | Status: DC | PRN

## 2017-06-01 MED ORDER — AZITHROMYCIN 250 MG PO TABS
500.0000 mg | ORAL_TABLET | Freq: Every day | ORAL | Status: DC
  Administered 2017-06-01: 500 mg via ORAL
  Filled 2017-06-01: qty 2

## 2017-06-01 MED ORDER — SODIUM CHLORIDE 0.9 % IV SOLN
2.0000 g | INTRAVENOUS | Status: DC
  Administered 2017-06-01: 2 g via INTRAVENOUS
  Filled 2017-06-01: qty 20
  Filled 2017-06-01: qty 2

## 2017-06-01 MED ORDER — INSULIN ASPART 100 UNIT/ML ~~LOC~~ SOLN
0.0000 [IU] | Freq: Three times a day (TID) | SUBCUTANEOUS | Status: DC
  Administered 2017-06-01: 7 [IU] via SUBCUTANEOUS
  Filled 2017-06-01: qty 1

## 2017-06-01 MED ORDER — INSULIN ASPART 100 UNIT/ML ~~LOC~~ SOLN
0.0000 [IU] | Freq: Three times a day (TID) | SUBCUTANEOUS | Status: DC
  Administered 2017-06-02 (×2): 15 [IU] via SUBCUTANEOUS

## 2017-06-01 MED ORDER — HYDROCODONE-ACETAMINOPHEN 5-325 MG PO TABS: 1.0000 | ORAL_TABLET | ORAL | Status: DC | PRN

## 2017-06-01 MED ORDER — INSULIN ASPART 100 UNIT/ML ~~LOC~~ SOLN
8.0000 [IU] | Freq: Three times a day (TID) | SUBCUTANEOUS | Status: DC
  Administered 2017-06-02 (×2): 8 [IU] via SUBCUTANEOUS

## 2017-06-01 MED ORDER — INSULIN ASPART 100 UNIT/ML ~~LOC~~ SOLN
10.0000 [IU] | SUBCUTANEOUS | Status: AC
  Administered 2017-06-01: 10 [IU] via INTRAVENOUS
  Filled 2017-06-01: qty 0.1

## 2017-06-01 MED ORDER — INSULIN GLARGINE 100 UNIT/ML SOLOSTAR PEN: 16.0000 [IU] | PEN_INJECTOR | Freq: Every day | SUBCUTANEOUS | Status: DC

## 2017-06-01 MED ORDER — MAGNESIUM SULFATE 2 GM/50ML IV SOLN
2.0000 g | Freq: Once | INTRAVENOUS | Status: AC
  Administered 2017-06-01: 2 g via INTRAVENOUS
  Filled 2017-06-01: qty 50

## 2017-06-01 MED ORDER — GABAPENTIN 300 MG PO CAPS
300.0000 mg | ORAL_CAPSULE | Freq: Every day | ORAL | Status: DC
  Administered 2017-06-01 (×2): 300 mg via ORAL
  Filled 2017-06-01 (×2): qty 1

## 2017-06-01 MED ORDER — ACETAMINOPHEN 325 MG PO TABS
650.0000 mg | ORAL_TABLET | Freq: Four times a day (QID) | ORAL | Status: DC | PRN
  Administered 2017-06-02: 650 mg via ORAL
  Filled 2017-06-01: qty 2

## 2017-06-01 MED ORDER — ATORVASTATIN CALCIUM 40 MG PO TABS
40.0000 mg | ORAL_TABLET | Freq: Every day | ORAL | Status: DC
  Administered 2017-06-01: 40 mg via ORAL
  Filled 2017-06-01: qty 1

## 2017-06-01 MED ORDER — SODIUM CHLORIDE 0.9 % IV SOLN: 500.0000 mg | INTRAVENOUS | Status: DC

## 2017-06-01 MED ORDER — INSULIN ASPART 100 UNIT/ML ~~LOC~~ SOLN
0.0000 [IU] | Freq: Every day | SUBCUTANEOUS | Status: DC
  Administered 2017-06-01: 5 [IU] via SUBCUTANEOUS
  Filled 2017-06-01: qty 1

## 2017-06-01 MED ORDER — INSULIN GLARGINE 100 UNIT/ML ~~LOC~~ SOLN
20.0000 [IU] | Freq: Once | SUBCUTANEOUS | Status: AC
  Administered 2017-06-01: 20 [IU] via SUBCUTANEOUS
  Filled 2017-06-01: qty 0.2

## 2017-06-01 MED ORDER — ACETAMINOPHEN 650 MG RE SUPP: 650.0000 mg | Freq: Four times a day (QID) | RECTAL | Status: DC | PRN

## 2017-06-01 MED ORDER — ONDANSETRON HCL 4 MG PO TABS: 4.0000 mg | ORAL_TABLET | Freq: Four times a day (QID) | ORAL | Status: DC | PRN

## 2017-06-01 MED ORDER — INSULIN ASPART 100 UNIT/ML ~~LOC~~ SOLN
4.0000 [IU] | Freq: Three times a day (TID) | SUBCUTANEOUS | Status: DC
  Administered 2017-06-01 (×2): 4 [IU] via SUBCUTANEOUS
  Filled 2017-06-01: qty 1

## 2017-06-01 MED ORDER — METHYLPREDNISOLONE SODIUM SUCC 125 MG IJ SOLR
60.0000 mg | Freq: Two times a day (BID) | INTRAMUSCULAR | Status: DC
  Administered 2017-06-01: 60 mg via INTRAVENOUS
  Filled 2017-06-01: qty 2

## 2017-06-01 MED ORDER — HYDRALAZINE HCL 10 MG PO TABS
10.0000 mg | ORAL_TABLET | Freq: Three times a day (TID) | ORAL | Status: DC
  Administered 2017-06-01 – 2017-06-02 (×4): 10 mg via ORAL
  Filled 2017-06-01 (×4): qty 1

## 2017-06-01 MED ORDER — CARVEDILOL 6.25 MG PO TABS
6.2500 mg | ORAL_TABLET | Freq: Two times a day (BID) | ORAL | Status: DC
  Administered 2017-06-01 – 2017-06-02 (×3): 6.25 mg via ORAL
  Filled 2017-06-01 (×3): qty 1

## 2017-06-01 MED ORDER — INSULIN ASPART 100 UNIT/ML ~~LOC~~ SOLN
0.0000 [IU] | Freq: Every day | SUBCUTANEOUS | Status: DC
  Administered 2017-06-01: 5 [IU] via SUBCUTANEOUS

## 2017-06-01 MED ORDER — ONDANSETRON HCL 4 MG/2ML IJ SOLN: 4.0000 mg | Freq: Four times a day (QID) | INTRAMUSCULAR | Status: DC | PRN

## 2017-06-01 MED ORDER — INSULIN ASPART 100 UNIT/ML IV SOLN
10.0000 [IU] | Freq: Once | INTRAVENOUS | Status: AC
  Administered 2017-06-01: 10 [IU] via INTRAVENOUS
  Filled 2017-06-01: qty 0.1

## 2017-06-01 MED ORDER — INSULIN GLARGINE 100 UNIT/ML ~~LOC~~ SOLN
20.0000 [IU] | Freq: Every day | SUBCUTANEOUS | Status: DC
  Administered 2017-06-01: 20 [IU] via SUBCUTANEOUS
  Filled 2017-06-01: qty 0.2

## 2017-06-01 NOTE — Progress Notes (Signed)
  Echocardiogram 2D Echocardiogram has been performed.  [name] F 06/01/2017, 4:07 PM

## 2017-06-01 NOTE — Progress Notes (Addendum)
PROGRESS NOTE    [name]  NWG:[id number] DOB: 02/18/1965 DOA: 05/31/2017 PCP: Marcine Matar, MD   Brief Narrative: Patient is a 52 year old female with past medical history of diabetes type 2, chronic  combined systolic and diastolic CHF, hyperlipidemia, lung nodules, hypertension, history of carotid dissection, asthma, CVA who presented to the emergency department with complaints of cough for 2 days and blood-tinged sputum along with shortness of breath.  Patient was admitted for further management of pneumonia here.  Assessment & Plan:   Active Problems:   Essential hypertension   Dyslipidemia associated with type 2 diabetes mellitus (HCC)   DCM (dilated cardiomyopathy) (HCC)   Uncontrolled type 2 diabetes mellitus without complication, with long-term current use of insulin (HCC)   Diabetic polyneuropathy associated with type 2 diabetes mellitus (HCC)   CAP (community acquired pneumonia)   Hemoptysis   Asthma  Community-acquired pneumonia: Chest imagings done on admission were suggestive of multifocal pneumonia.  We will treat at community aqcuired pneumonia. Currently on ceftriaxone and azithromycin.  We will follow-up sputum culture, blood culture.  Respiratory status significantly improved this morning.  Currently she is saturating fine on room air.  Does not complain of any shortness of breath.  Hemoptysis: Patient was complaining of blood-tinged sputum which has improved .Most likely associated  with community-acquired pneumonia.  History of VHQ:[id number] following DCM/CHF originally diagnosed in April 2013 .Cardiac catheterization showed normal coronary arteries .Repeat echo EF30-35% on 05/2016, she was referred for ICD but decided against it.  She has been compliant her Coreg and Lasix but has been forgetting to take her other medications. Not on ARB secondary to angioedema. Patient does look volume overloaded.  Lasix held on admission because she was dehydrated.   When appropriate will resume. Patient also has a mildly elevated troponin most likely associated with  CHF.  Denies any chest pain.  History of asthma: Currently stable.  No wheezing.  Steroids discontinued .Her lungs are clear this morning.  Hypertension: Currently blood pressure stable.  Will resume her home medications.    Diabetes type 2: Continue current regimen.  Hyperglycemia secondary to   Steroids.  Steroids discontinued.  We will continue to monitor her blood sugars.  Hyperlipidemia: Continue statin.  Diabetic polyneuropathy: Continue Neurontin  History of left common carotid artery dissection : Healed on most recent CT scan.  Hypomagnesemia: Supplemented.  Will check the levels tomorrow   DVT prophylaxis: SCD Code Status: Full Family Communication: None present at the bedside Disposition Plan: Likely home tomorrow   Consultants: Cardiology  Procedures: None  Antimicrobials: Ceftriaxone and azithromycin since 05/31/2017  Subjective: Patient seen and examined the pressure this morning.  Appears very comfortable.  Respiratory status is stable.  She is saturating fine on room air.  Denies any shortness of breath.  Objective: Vitals:   06/01/17 1343 06/01/17 1346 06/01/17 1348 06/01/17 1349  BP:  118/87 121/69 120/81  Pulse:  98 97 97  Resp:      Temp:      TempSrc:      SpO2:  96% 98% 98%  Weight: 98.4 kg (216 lb 14.4 oz)     Height: 5\' 9"  (1.753 m)       Intake/Output Summary (Last 24 hours) at 06/01/2017 1404 Last data filed at 05/31/2017 2159 Gross per 24 hour  Intake 1100 ml  Output -  Net 1100 ml   Filed Weights   06/01/17 1343  Weight: 98.4 kg (216 lb 14.4 oz)    Examination:  General exam: Appears calm and comfortable ,Not in distress,average built HEENT:PERRL,Oral mucosa moist, Ear/Nose normal on gross exam Respiratory system: Bilateral equal air entry, normal vesicular breath sounds, no wheezes or crackles  Cardiovascular system: S1 & S2  heard, RRR. No JVD, murmurs, rubs, gallops or clicks. No pedal edema. Gastrointestinal system: Abdomen is nondistended, soft and nontender. No organomegaly or masses felt. Normal bowel sounds heard. Central nervous system: Alert and oriented. No focal neurological deficits. Extremities: No edema, no clubbing ,no cyanosis, distal peripheral pulses palpable. Skin: No rashes, lesions or ulcers,no icterus ,no pallor MSK: Normal muscle bulk,tone ,power Psychiatry: Judgement and insight appear normal. Mood & affect appropriate.     Data Reviewed: I have personally reviewed following labs and imaging studies  CBC: Recent Labs  Lab 05/31/17 1417 06/01/17 0340 06/01/17 0505  WBC 18.6* 15.3* 14.5*  NEUTROABS  --  14.4*  --   HGB 12.8 11.9* 11.7*  HCT 38.0 36.9 36.4  MCV 84.6 85.2 85.4  PLT 429* 357 368   Basic Metabolic Panel: Recent Labs  Lab 05/31/17 1417 06/01/17 0505  NA 134* 136  K 4.2 3.9  CL 102 103  CO2 19* 20*  GLUCOSE 359* 403*  BUN 11 13  CREATININE 1.01* 0.87  CALCIUM 8.9 8.5*  MG  --  1.4*  PHOS  --  3.0   GFR: Estimated Creatinine Clearance: 97.7 mL/min (by C-G formula based on SCr of 0.87 mg/dL). Liver Function Tests: Recent Labs  Lab 06/01/17 0505  AST 21  ALT 14  ALKPHOS 77  BILITOT 1.9*  PROT 6.8  ALBUMIN 3.1*   No results for input(s): LIPASE, AMYLASE in the last 168 hours. No results for input(s): AMMONIA in the last 168 hours. Coagulation Profile: Recent Labs  Lab 06/01/17 0340  INR 1.30   Cardiac Enzymes: Recent Labs  Lab 05/31/17 2214 06/01/17 0355  TROPONINI 0.07* 0.06*   BNP (last 3 results) No results for input(s): PROBNP in the last 8760 hours. HbA1C: No results for input(s): HGBA1C in the last 72 hours. CBG: Recent Labs  Lab 06/01/17 0349 06/01/17 0813 06/01/17 1211  GLUCAP 379* 343* 461*   Lipid Profile: No results for input(s): CHOL, HDL, LDLCALC, TRIG, CHOLHDL, LDLDIRECT in the last 72 hours. Thyroid Function  Tests: Recent Labs    06/01/17 0505  TSH 0.656   Anemia Panel: No results for input(s): VITAMINB12, FOLATE, FERRITIN, TIBC, IRON, RETICCTPCT in the last 72 hours. Sepsis Labs: Recent Labs  Lab 06/01/17 0340 06/01/17 0341 06/01/17 0441  PROCALCITON 0.56  --   --   LATICACIDVEN  --  1.9 2.1*    No results found for this or any previous visit (from the past 240 hour(s)).       Radiology Studies: Dg Chest 2 View  Result Date: 05/31/2017 CLINICAL DATA:  Chest pain cough and short of breath EXAM: CHEST - 2 VIEW COMPARISON:  04/02/2017 FINDINGS: Patchy right lower lobe infiltrate is new. Left lung clear. Cardiac enlargement without heart failure or effusion. IMPRESSION: Moderately large infiltrate on the right, most likely pneumonia. Electronically Signed   By: Marlan Palau M.D.   On: 05/31/2017 14:02   Ct Angio Chest Pe W Or Wo Contrast  Result Date: 05/31/2017 CLINICAL DATA:  Cough for several days. Concern for pulmonary embolism. EXAM: CT ANGIOGRAPHY CHEST WITH CONTRAST TECHNIQUE: Multidetector CT imaging of the chest was performed using the standard protocol during bolus administration of intravenous contrast. Multiplanar CT image reconstructions and MIPs were obtained to  evaluate the vascular anatomy. CONTRAST:  ISOVUE-370 IOPAMIDOL (ISOVUE-370) INJECTION 76% COMPARISON:  Chest CT 04/24/2017, radiograph 05/31/2017 FINDINGS: Cardiovascular: No filling defects within the pulmonary arteries to suggest acute pulmonary embolism. No acute findings of the aorta or great vessels. No pericardial fluid. Mediastinum/Nodes: No axillary or supraclavicular adenopathy. No mediastinal hilar adenopathy. No pericardial effusion. Esophagus Lungs/Pleura: There confluent perihilar airspace disease in the RIGHT upper lobe, RIGHT middle lobe and RIGHT lower lobe. Patchy airspace disease in the LEFT upper lobe and LEFT lower lobe additionally. No pneumothorax. No pleural fluid Upper Abdomen: Limited  view of the liver, kidneys, pancreas are unremarkable. Normal adrenal glands. Musculoskeletal: No aggressive osseous lesion. Review of the MIP images confirms the above findings. IMPRESSION: 1. Confluent perihilar airspace disease in the RIGHT lung to lesser degree LEFT lung is concerning for multifocal pneumonia. Flash pulmonary edema could have a similar pattern. 2. No acute pulmonary embolism. Electronically Signed   By: Genevive Bi M.D.   On: 05/31/2017 23:39        Scheduled Meds: . atorvastatin  40 mg Oral q1800  . azithromycin  500 mg Oral QHS  . carvedilol  6.25 mg Oral BID WC  . gabapentin  300 mg Oral QHS  . hydrALAZINE  10 mg Oral TID  . insulin aspart  0-5 Units Subcutaneous QHS  . insulin aspart  0-9 Units Subcutaneous TID WC  . insulin aspart  10 Units Intravenous Once  . insulin aspart  4 Units Subcutaneous TID WC  . insulin glargine  20 Units Subcutaneous Q2200   Continuous Infusions: . cefTRIAXone (ROCEPHIN)  IV       LOS: 1 day    Time spent: 25 mins.More than 50% of that time was spent in counseling and/or coordination of care.      Burnadette Pop, MD [facility] Pager [phone]  If 7PM-7AM, please contact night-coverage [email or web address] Password [facility] 06/01/2017, 2:04 PM

## 2017-06-01 NOTE — Consult Note (Signed)
CARDIOLOGY CONSULT NOTE       Patient ID: [name] MRN: [id number] DOB/AGE: [birth year] 50 y.o.  Admit date: 05/31/2017 Referring Physician: Renford Dills Primary Physician: Marcine Matar, MD Primary Cardiologist: Jens Som Reason for Consultation: CHF/Cardiomyopathy  Active Problems:   Essential hypertension   Dyslipidemia associated with type 2 diabetes mellitus (HCC)   DCM (dilated cardiomyopathy) (HCC)   Uncontrolled type 2 diabetes mellitus without complication, with long-term current use of insulin (HCC)   Diabetic polyneuropathy associated with type 2 diabetes mellitus (HCC)   CAP (community acquired pneumonia)   Hemoptysis   Asthma   HPI:  52 y.o. admitted with pneumonia History of HTN, left carotid dissection DM and non ischemic DCM. This dates back to 2013 negative cath And EF has been in same range 30-35% for 6 years. Angioedema with ACE so not on and cannot use entresto either. Admitted with 48 hours cough Diaphoresis and hemoptysis. CT shows multi focal pneumonia She denies significant weight gain or more edema. Not always compliant with meds After antibiotics in ER feels better this am. WBC over 18 No chest pain or cough this am Troponin only .06  Tachycardic on arrival to ER rate 117 much Better this am in high 80's low 90's no arrhythmia NSR.   ROS All other systems reviewed and negative except as noted above  Past Medical History:  Diagnosis Date  . Asthma 05/31/2017  . Diabetes mellitus, type 2 (HCC)    A1C 7.6 April '13  . Hypertension   . Left leg pain 07/21/2015  . Nonischemic cardiomyopathy (HCC)    Echo 05/19/11 EF 35% w/ mild-mod MR; R/L Heart Cath 05/21/11 - mildly elevated right heart pressures, normal left heart pressures, preserved cardiac output, widely patent coronary arteries without significant CAD and moderate global systolic LV dysfunction, EF 35-40%.  . 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 Systolic CHF  (HCC)    Echo 05/19/11 EF 35% w/ mild-mod MR    Family History  Problem Relation Age of Onset  . Diabetes Unknown        multiple  . Heart failure Paternal Grandmother   . Breast cancer Paternal Grandmother   . Heart disease Unknown        multiple  . Breast cancer Mother     Social History   Socioeconomic History  . Marital status: Single    Spouse name: Not on file  . Number of children: 2  . Years of education: Not on file  . Highest education level: Not on file  Occupational History  . Not on file  Social Needs  . Financial resource strain: Not on file  . Food insecurity:    Worry: Not on file    Inability: Not on file  . Transportation needs:    Medical: Not on file    Non-medical: Not on file  Tobacco Use  . Smoking status: Never Smoker  . Smokeless tobacco: Never Used  Substance and Sexual Activity  . Alcohol use: No  . Drug use: No  . Sexual activity: Yes    Birth control/protection: None  Lifestyle  . Physical activity:    Days per week: Not on file    Minutes per session: Not on file  . Stress: Not on file  Relationships  . Social connections:    Talks on phone: Not on file    Gets together: Not on file    Attends religious service: Not on file  Active member of club or organization: Not on file    Attends meetings of clubs or organizations: Not on file    Relationship status: Not on file  . Intimate partner violence:    Fear of current or ex partner: Not on file    Emotionally abused: Not on file    Physically abused: Not on file    Forced sexual activity: Not on file  Other Topics Concern  . Not on file  Social History Narrative   Lives in [place].  Presently unemployed but attends school full time    Past Surgical History:  Procedure Laterality Date  . CERVIX LESION DESTRUCTION    . DILATION AND CURETTAGE OF UTERUS    . LEFT HEART CATHETERIZATION WITH CORONARY ANGIOGRAM N/A 05/21/2011   Procedure: LEFT HEART CATHETERIZATION WITH  CORONARY ANGIOGRAM;  Surgeon: Tonny Bollman, MD;  Location: [facility] CATH LAB;  Service: Cardiovascular;  Laterality: N/A;  . TUBAL LIGATION  1996     . atorvastatin  40 mg Oral q1800  . carvedilol  6.25 mg Oral BID WC  . gabapentin  300 mg Oral QHS  . hydrALAZINE  10 mg Oral TID  . insulin aspart  0-5 Units Subcutaneous QHS  . insulin aspart  0-9 Units Subcutaneous TID WC  . Insulin Glargine  16 Units Subcutaneous Q2200  . iopamidol      . methylPREDNISolone (SOLU-MEDROL) injection  60 mg Intravenous Q12H   . azithromycin    . cefTRIAXone (ROCEPHIN)  IV    . magnesium sulfate 1 - 4 g bolus IVPB      Physical Exam: Blood pressure 135/68, pulse 95, temperature (!) 97.4 F (36.3 C), temperature source Oral, resp. rate (!) 28, last menstrual period 05/24/2017, SpO2 98 %.   Affect appropriate Overweight black female  HEENT: normal Neck supple with no adenopathy JVP normal no bruits no thyromegaly Lungs rhonchi no wheezing and good diaphragmatic motion Heart:  S1/S2 no murmur, no rub, gallop or click PMI normal Abdomen: benighn, BS positve, no tenderness, no AAA no bruit.  No HSM or HJR Distal pulses intact with no bruits Trace edema tattoo RLE with birth mark  Neuro non-focal Skin warm and dry No muscular weakness   Labs:   Lab Results  Component Value Date   WBC 14.5 (H) 06/01/2017   HGB 11.7 (L) 06/01/2017   HCT 36.4 06/01/2017   MCV 85.4 06/01/2017   PLT 368 06/01/2017    Recent Labs  Lab 06/01/17 0505  NA 136  K 3.9  CL 103  CO2 20*  BUN 13  CREATININE 0.87  CALCIUM 8.5*  PROT 6.8  BILITOT 1.9*  ALKPHOS 77  ALT 14  AST 21  GLUCOSE 403*   Lab Results  Component Value Date   CKTOTAL 207 (H) 05/19/2011   CKMB 1.3 05/19/2011   TROPONINI 0.06 (HH) 06/01/2017    Lab Results  Component Value Date   CHOL 187 05/05/2015   CHOL 258 (H) 02/24/2015   Lab Results  Component Value Date   HDL 29 (L) 05/05/2015   HDL 33 (L) 02/24/2015   Lab Results    Component Value Date   LDLCALC 121 05/05/2015   LDLCALC 192 (H) 02/24/2015   Lab Results  Component Value Date   TRIG 184 (H) 05/05/2015   TRIG 164 (H) 02/24/2015   Lab Results  Component Value Date   CHOLHDL 6.4 (H) 05/05/2015   CHOLHDL 7.8 02/24/2015   No results found for: LDLDIRECT  Radiology: Dg Chest 2 View  Result Date: 05/31/2017 CLINICAL DATA:  Chest pain cough and short of breath EXAM: CHEST - 2 VIEW COMPARISON:  04/02/2017 FINDINGS: Patchy right lower lobe infiltrate is new. Left lung clear. Cardiac enlargement without heart failure or effusion. IMPRESSION: Moderately large infiltrate on the right, most likely pneumonia. Electronically Signed   By: Marlan Palau M.D.   On: 05/31/2017 14:02   Ct Angio Chest Pe W Or Wo Contrast  Result Date: 05/31/2017 CLINICAL DATA:  Cough for several days. Concern for pulmonary embolism. EXAM: CT ANGIOGRAPHY CHEST WITH CONTRAST TECHNIQUE: Multidetector CT imaging of the chest was performed using the standard protocol during bolus administration of intravenous contrast. Multiplanar CT image reconstructions and MIPs were obtained to evaluate the vascular anatomy. CONTRAST:  ISOVUE-370 IOPAMIDOL (ISOVUE-370) INJECTION 76% COMPARISON:  Chest CT 04/24/2017, radiograph 05/31/2017 FINDINGS: Cardiovascular: No filling defects within the pulmonary arteries to suggest acute pulmonary embolism. No acute findings of the aorta or great vessels. No pericardial fluid. Mediastinum/Nodes: No axillary or supraclavicular adenopathy. No mediastinal hilar adenopathy. No pericardial effusion. Esophagus Lungs/Pleura: There confluent perihilar airspace disease in the RIGHT upper lobe, RIGHT middle lobe and RIGHT lower lobe. Patchy airspace disease in the LEFT upper lobe and LEFT lower lobe additionally. No pneumothorax. No pleural fluid Upper Abdomen: Limited view of the liver, kidneys, pancreas are unremarkable. Normal adrenal glands. Musculoskeletal: No  aggressive osseous lesion. Review of the MIP images confirms the above findings. IMPRESSION: 1. Confluent perihilar airspace disease in the RIGHT lung to lesser degree LEFT lung is concerning for multifocal pneumonia. Flash pulmonary edema could have a similar pattern. 2. No acute pulmonary embolism. Electronically Signed   By: Genevive Bi M.D.   On: 05/31/2017 23:39    EKG: NSR non specific ST/T wave changes    ASSESSMENT AND PLAN:   Tachycardia: improved related to pneumonia no arrhythmia no pathologic  DCM: chronic non ischemic DCM over 6 years with EF 30-35% Rx limited by angioedema with ACE continue coreg and hydralazine Lasix as needed to keep I/O's even Does not look volume overloaded  Pneumonia:  Community acquired on azithromycin and ceftriaxone improved multifocal by CT scan does not look toxic this am    Signed: Charlton Haws 06/01/2017, 8:47 AM

## 2017-06-01 NOTE — ED Notes (Signed)
One set of bld cultures drawn, order placed after antibiotics were given. Unable to draw second set.

## 2017-06-01 NOTE — Progress Notes (Signed)
PHARMACIST - PHYSICIAN COMMUNICATION  CONCERNING: Antibiotic IV to Oral Route Change Policy  RECOMMENDATION: This patient is receiving azithromycin by the intravenous route.  Based on criteria approved by the Pharmacy and Therapeutics Committee, the antibiotic(s) is/are being converted to the equivalent oral dose form(s).   DESCRIPTION: These criteria include:  Patient being treated for a respiratory tract infection, urinary tract infection, cellulitis or clostridium difficile associated diarrhea if on metronidazole  The patient is not neutropenic and does not exhibit a GI malabsorption state  The patient is eating (either orally or via tube) and/or has been taking other orally administered medications for a least 24 hours  The patient is improving clinically and has a Tmax < 100.5  If you have questions about this conversion, please contact the Pharmacy Department  []   [phone] )  [facility] []   [phone] )  [facility] []   [phone] )  [facility] []   [phone] )  [facility] [x]   [phone] )  [facility]  Herby Abraham, [place].D. 06/01/2017 11:34 AM

## 2017-06-01 NOTE — ED Notes (Signed)
Dr. Adela Glimpse made aware of pt. Elevated troponin. No intervention needed at this time.

## 2017-06-01 NOTE — ED Notes (Addendum)
Critical lab received for lactic acid 2.1. Awaiting for Porfirio Mylar to page hospitalist to make aware.

## 2017-06-01 NOTE — Progress Notes (Signed)
Inpatient Diabetes Program Recommendations  AACE/ADA: New Consensus Statement on Inpatient Glycemic Control (2015)  Target Ranges:  Prepandial:   less than 140 mg/dL      Peak postprandial:   less than 180 mg/dL (1-2 hours)      Critically ill patients:  140 - 180 mg/dL  Results for [name], [name] (MRN [id number]) as of 06/01/2017 09:42  Ref. Range 06/01/2017 03:49 06/01/2017 08:13  Glucose-Capillary Latest Ref Range: 65 - 99 mg/dL 009 (H) 381 (H)   Results for [name], [name] (MRN [id number]) as of 06/01/2017 09:42  Ref. Range 05/31/2017 14:17 06/01/2017 05:05  Glucose Latest Ref Range: 65 - 99 mg/dL 678 (H) 938 (H)   Results for [name], [name] (MRN [id number]) as of 06/01/2017 09:42  Ref. Range 02/21/2017 15:16  Hemoglobin A1C Unknown 12.7   Review of Glycemic Control  Diabetes history: DM2 Outpatient Diabetes medications: Lantus 16 units daily, Metformin XR 1000 mg BID Current orders for Inpatient glycemic control: Lantus 16 units QHS, Novolog 0-9 units TID with meals, Novolog 0-5 units QHS  Inpatient Diabetes Program Recommendations:  Insulin - Basal: Ordered Lantus 16 units QHS to start tonight. Recommend increasing Lantus to 20 units and change frequency to daily and start now (based on 98 kg x 0.2 units). Insulin - Meal Coverage: If steroids are continued, please consider ordering Novolog 4 units TID with meals for meal coverage if patient eats at least 50% of meals. HgbA1C: A1C was 12.7% on 02/21/17 at last office visit at [facility]. Current A1C has been ordered.  NOTE: Consult for Diabetes Coordinator noted. Diabetes Coordinator is not on campus over the weekend but available by pager for questions or concerns from 8am to 5pm.  Chart reviewed. Patient was last seen at [facility] on 02/21/17 and A1C was 12.7% at that time. Patient was instructed to increase Lantus from 12  to 16 units daily and to titrate every 3 days until fasting glucose less  than 150 mg/dl. Initial glucose 359 mg/dl and fasting glucose 852 mg/dl this morning. Patient has not been given any Lantus insulin since being admitted. Recommend increasing Lantus to 20 units daily and start now and if steroids continued Novolog 4 units TID with meals for meal coverage if patient eats at least 50% of meals (in addition to Novolog correction scale).  Thanks, Orlando Penner, RN, MSN, CDE Diabetes Coordinator Inpatient Diabetes Program [phone] (Team Pager from 8am to 5pm)

## 2017-06-01 NOTE — ED Notes (Signed)
ED TO INPATIENT HANDOFF REPORT  Name/Age/Gender [name] 52 y.o. female  Code Status    Code Status Orders  (From admission, onward)        Start     Ordered   06/01/17 0154  Full code  Continuous     06/01/17 0153    Code Status History    Date Active Date Inactive Code Status Order ID Comments User Context   06/10/2016 1216 06/12/2016 1831 Full Code [id number]  [name] ED   02/23/2015 2001 02/26/2015 2133 DNR [id number]  [name] Inpatient      Home/SNF/Other Home  Chief Complaint cough  Level of Care/Admitting Diagnosis ED Disposition    ED Disposition Condition [place]: [place] [[id number]]  Level of Care: Telemetry [5]  Diagnosis: CAP (community acquired pneumonia) [[id number]]  Admitting Physician: Shelly Coss [[id number]]  Attending Physician: Shelly Coss [[id number]]  Estimated length of stay: past midnight tomorrow  Certification:: I certify this patient will need inpatient services for at least 2 midnights  PT Class (Meyer Not Modify): Inpatient [101]  PT Acc Code (Meyer Not Modify): Private [1]       Medical History Past Medical History:  Diagnosis Date  . Asthma 05/31/2017  . Diabetes mellitus, type 2 (HCC)    A1C 7.6 April '13  . Hypertension   . Left leg pain 07/21/2015  . Nonischemic cardiomyopathy ([place])    Echo 05/19/11 EF 35% w/ mild-mod MR; R/L Heart Cath 05/21/11 - mildly elevated right heart pressures, normal left heart pressures, preserved cardiac output, widely patent coronary arteries without significant CAD and moderate global systolic LV dysfunction, EF 35-40%.  . 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 Systolic CHF ([place])    Echo 05/19/11 EF 35% w/ mild-mod MR    Allergies Allergies  Allergen Reactions  . Lisinopril Hives and Swelling    Facial   . Sertraline Hives and Swelling    Facial   . Tramadol Hives and Swelling    facial  .  Ibuprofen Hives and Swelling    IV Location/Drains/Wounds Patient Lines/Drains/Airways Status   Active Line/Drains/Airways    Name:   Placement date:   Placement time:   Site:   Days:   Peripheral IV 05/31/17 Left;Lateral Antecubital   05/31/17    1953    Antecubital   1   Peripheral IV 05/31/17 Right;Lateral Antecubital   05/31/17    2255    Antecubital   1          Labs/Imaging Results for orders placed or performed during the hospital encounter of 05/31/17 (from the past 48 hour(s))  Basic metabolic panel     Status: Abnormal   Collection Time: 05/31/17  2:17 PM  Result Value Ref Range   Sodium 134 (L) 135 - 145 mmol/L   Potassium 4.2 3.5 - 5.1 mmol/L   Chloride 102 101 - 111 mmol/L   CO2 19 (L) 22 - 32 mmol/L   Glucose, Bld 359 (H) 65 - 99 mg/dL   BUN 11 6 - 20 mg/dL   Creatinine, Ser 1.01 (H) 0.44 - 1.00 mg/dL   Calcium 8.9 8.9 - 10.3 mg/dL   GFR calc non Af Amer >60 >60 mL/min   GFR calc Af Amer >60 >60 mL/min    Comment: (NOTE) The eGFR has been calculated using the CKD EPI equation. This calculation has not been validated in  all clinical situations. eGFR's persistently <60 mL/min signify possible Chronic Kidney Disease.    Anion gap 13 5 - 15    Comment: Performed at [facility], [place] [address], [place], [place] [address]  CBC     Status: Abnormal   Collection Time: 05/31/17  2:17 PM  Result Value Ref Range   WBC 18.6 (H) 4.0 - 10.5 K/uL   RBC 4.49 3.87 - 5.11 MIL/uL   Hemoglobin 12.8 12.0 - 15.0 g/dL   HCT 38.0 36.0 - 46.0 %   MCV 84.6 78.0 - 100.0 fL   MCH 28.5 26.0 - 34.0 pg   MCHC 33.7 30.0 - 36.0 g/dL   RDW 13.6 11.5 - 15.5 %   Platelets 429 (H) 150 - 400 K/uL    Comment: Performed at [facility], [place] [address] [place], [place] [address]  I-stat troponin, ED     Status: None   Collection Time: 05/31/17  2:23 PM  Result Value Ref Range   Troponin i, poc 0.00 0.00 - 0.08 ng/mL   Comment 3            Comment:  Due to the release kinetics of cTnI, a negative result within the first hours of the onset of symptoms does not rule out myocardial infarction with certainty. If myocardial infarction is still suspected, repeat the test at appropriate intervals.   I-Stat beta hCG blood, ED     Status: Abnormal   Collection Time: 05/31/17  2:23 PM  Result Value Ref Range   I-stat hCG, quantitative 5.7 (H) <5 mIU/mL   Comment 3            Comment:   GEST. AGE      CONC.  (mIU/mL)   <=1 WEEK        5 - 50     2 WEEKS       50 - 500     3 WEEKS       100 - 10,000     4 WEEKS     1,000 - 30,000        FEMALE AND NON-PREGNANT FEMALE:     LESS THAN 5 mIU/mL   Troponin I (q 6hr x 3)     Status: Abnormal   Collection Time: 05/31/17 10:14 PM  Result Value Ref Range   Troponin I 0.07 (HH) <0.03 ng/mL    Comment: CRITICAL RESULT CALLED TO, READ BACK BY AND VERIFIED WITHHosie Poisson RN [phone] [place],N Performed at [facility], [place] [address], [place], [place] [address]   Blood gas, venous     Status: Abnormal (Preliminary result)   Collection Time: 05/31/17 11:10 PM  Result Value Ref Range   FIO2 21.00    O2 Content PENDING L/min   pH, Ven 7.444 (H) 7.250 - 7.430   pCO2, Ven 31.3 (L) 44.0 - 60.0 mmHg   pO2, Ven 47.7 (H) 32.0 - 45.0 mmHg   Bicarbonate 21.1 20.0 - 28.0 mmol/L   Acid-base deficit 1.6 0.0 - 2.0 mmol/L   O2 Saturation 81.0 %   Patient temperature 98.6    Collection site VEIN    Drawn by [phone]    Sample type VENIPUNCTURE     Comment: Performed at [facility], [place] [address], [place], [place] [address]  D-dimer, quantitative (not at [facility])     Status: Abnormal   Collection Time: 06/01/17  3:40 AM  Result Value Ref Range  D-Dimer, Quant 0.83 (H) 0.00 - 0.50 ug/mL-FEU    Comment: (NOTE) At the manufacturer cut-off of 0.50 ug/mL FEU, this assay has been documented to exclude PE with a sensitivity and negative predictive value of 97 to 99%.   At this time, this assay has not been approved by the FDA to exclude DVT/VTE. Results should be correlated with clinical presentation. Performed at [facility], [place] [address], [place], [place] [address]   CBC with Differential/Platelet     Status: Abnormal   Collection Time: 06/01/17  3:40 AM  Result Value Ref Range   WBC 15.3 (H) 4.0 - 10.5 K/uL   RBC 4.33 3.87 - 5.11 MIL/uL   Hemoglobin 11.9 (L) 12.0 - 15.0 g/dL   HCT 36.9 36.0 - 46.0 %   MCV 85.2 78.0 - 100.0 fL   MCH 27.5 26.0 - 34.0 pg   MCHC 32.2 30.0 - 36.0 g/dL   RDW 13.6 11.5 - 15.5 %   Platelets 357 150 - 400 K/uL   Neutrophils Relative % 94 %   Neutro Abs 14.4 (H) 1.7 - 7.7 K/uL   Lymphocytes Relative 5 %   Lymphs Abs 0.8 0.7 - 4.0 K/uL   Monocytes Relative 1 %   Monocytes Absolute 0.1 0.1 - 1.0 K/uL   Eosinophils Relative 0 %   Eosinophils Absolute 0.0 0.0 - 0.7 K/uL   Basophils Relative 0 %   Basophils Absolute 0.0 0.0 - 0.1 K/uL    Comment: Performed at [facility], [place] [address], [place], [place] [address]  Procalcitonin     Status: None   Collection Time: 06/01/17  3:40 AM  Result Value Ref Range   Procalcitonin 0.56 ng/mL    Comment:        Interpretation: PCT > 0.5 ng/mL and <= 2 ng/mL: Systemic infection (sepsis) is possible, but other conditions are known to elevate PCT as well. (NOTE)       Sepsis PCT Algorithm           Lower Respiratory Tract                                      Infection PCT Algorithm    ----------------------------     ----------------------------         PCT < 0.25 ng/mL                PCT < 0.10 ng/mL         Strongly encourage             Strongly discourage   discontinuation of antibiotics    initiation of antibiotics    ----------------------------     -----------------------------       PCT 0.25 - 0.50 ng/mL            PCT 0.10 - 0.25 ng/mL               OR       >80% decrease in PCT            Discourage initiation of                                             antibiotics      Encourage discontinuation  of antibiotics    ----------------------------     -----------------------------         PCT >= 0.50 ng/mL              PCT 0.26 - 0.50 ng/mL                AND       <80% decrease in PCT             Encourage initiation of                                             antibiotics       Encourage continuation           of antibiotics    ----------------------------     -----------------------------        PCT >= 0.50 ng/mL                  PCT > 0.50 ng/mL               AND         increase in PCT                  Strongly encourage                                      initiation of antibiotics    Strongly encourage escalation           of antibiotics                                     -----------------------------                                           PCT <= 0.25 ng/mL                                                 OR                                        > 80% decrease in PCT                                     Discontinue / Meyer not initiate                                             antibiotics Performed at [place] [address], [place], [place] [address]   Protime-INR     Status: Abnormal   Collection Time: 06/01/17  3:40 AM  Result Value Ref Range   Prothrombin Time 16.1 (H) 11.4 - 15.2 seconds  INR 1.30     Comment: Performed at [facility], [place] [address] [place], [place] [address]  APTT     Status: None   Collection Time: 06/01/17  3:40 AM  Result Value Ref Range   aPTT 30 24 - 36 seconds    Comment: Performed at [facility], [place] [address], [place], [place] [address]  Lactic acid, plasma     Status: None   Collection Time: 06/01/17  3:41 AM  Result Value Ref Range   Lactic Acid, Venous 1.9 0.5 - 1.9 mmol/L    Comment: Performed at [facility], [place] [address] [place], [place] [address]   CBG monitoring, ED     Status: Abnormal   Collection Time: 06/01/17  3:49 AM  Result Value Ref Range   Glucose-Capillary 379 (H) 65 - 99 mg/dL   Comment 1 Notify RN    Comment 2 Document in Chart   Troponin I (q 6hr x 3)     Status: Abnormal   Collection Time: 06/01/17  3:55 AM  Result Value Ref Range   Troponin I 0.06 (HH) <0.03 ng/mL    Comment: CRITICAL VALUE NOTED.  VALUE IS CONSISTENT WITH PREVIOUSLY REPORTED AND CALLED VALUE. Performed at [facility], [place] [address] [place], [place] [address]   Lactic acid, plasma     Status: Abnormal   Collection Time: 06/01/17  4:41 AM  Result Value Ref Range   Lactic Acid, Venous 2.1 (HH) 0.5 - 1.9 mmol/L    Comment: CRITICAL RESULT CALLED TO, READ BACK BY AND VERIFIED WITH: WOOD,A RN [phone] COVINGTON,N Performed at [facility], [place] [address] [place], [place] [address]   Magnesium     Status: Abnormal   Collection Time: 06/01/17  5:05 AM  Result Value Ref Range   Magnesium 1.4 (L) 1.7 - 2.4 mg/dL    Comment: Performed at [facility], [place] [address] [place], [place] [address]  Phosphorus     Status: None   Collection Time: 06/01/17  5:05 AM  Result Value Ref Range   Phosphorus 3.0 2.5 - 4.6 mg/dL    Comment: Performed at [facility], [place] [address] [place], [place] [address]  TSH     Status: None   Collection Time: 06/01/17  5:05 AM  Result Value Ref Range   TSH 0.656 0.350 - 4.500 uIU/mL    Comment: Performed by a 3rd Generation assay with a functional sensitivity of <=0.01 uIU/mL. Performed at [facility], [place] [address] [place],  [address]   Comprehensive metabolic panel     Status: Abnormal   Collection Time: 06/01/17  5:05 AM  Result Value Ref Range   Sodium 136 135 - 145 mmol/L   Potassium 3.9 3.5 - 5.1 mmol/L   Chloride 103 101 - 111 mmol/L   CO2 20 (L) 22 - 32 mmol/L   Glucose, Bld 403 (H) 65 - 99 mg/dL    BUN 13 6 - 20 mg/dL   Creatinine, Ser 0.87 0.44 - 1.00 mg/dL   Calcium 8.5 (L) 8.9 - 10.3 mg/dL   Total Protein 6.8 6.5 - 8.1 g/dL   Albumin 3.1 (L) 3.5 - 5.0 g/dL   AST 21 15 - 41 U/L   ALT 14 14 - 54 U/L   Alkaline Phosphatase 77 38 - 126 U/L   Total Bilirubin 1.9 (H) 0.3 - 1.2 mg/dL   GFR calc non  Af Amer >60 >60 mL/min   GFR calc Af Amer >60 >60 mL/min    Comment: (NOTE) The eGFR has been calculated using the CKD EPI equation. This calculation has not been validated in all clinical situations. eGFR's persistently <60 mL/min signify possible Chronic Kidney Disease.    Anion gap 13 5 - 15    Comment: Performed at [facility], [place] [address], [place], [place] [address]  CBC     Status: Abnormal   Collection Time: 06/01/17  5:05 AM  Result Value Ref Range   WBC 14.5 (H) 4.0 - 10.5 K/uL   RBC 4.26 3.87 - 5.11 MIL/uL   Hemoglobin 11.7 (L) 12.0 - 15.0 g/dL   HCT 36.4 36.0 - 46.0 %   MCV 85.4 78.0 - 100.0 fL   MCH 27.5 26.0 - 34.0 pg   MCHC 32.1 30.0 - 36.0 g/dL   RDW 13.9 11.5 - 15.5 %   Platelets 368 150 - 400 K/uL    Comment: Performed at [facility], [place] [address], [place], [place] [address]  CBG monitoring, ED     Status: Abnormal   Collection Time: 06/01/17  8:13 AM  Result Value Ref Range   Glucose-Capillary 343 (H) 65 - 99 mg/dL  CBG monitoring, ED     Status: Abnormal   Collection Time: 06/01/17 12:11 PM  Result Value Ref Range   Glucose-Capillary 461 (H) 65 - 99 mg/dL   Dg Chest 2 View  Result Date: 05/31/2017 CLINICAL DATA:  Chest pain cough and short of breath EXAM: CHEST - 2 VIEW COMPARISON:  04/02/2017 FINDINGS: Patchy right lower lobe infiltrate is new. Left lung clear. Cardiac enlargement without heart failure or effusion. IMPRESSION: Moderately large infiltrate on the right, most likely pneumonia. Electronically Signed   By: Franchot Gallo M.D.   On: 05/31/2017 14:02   Ct Angio Chest Pe W Or Wo Contrast  Result  Date: 05/31/2017 CLINICAL DATA:  Cough for several days. Concern for pulmonary embolism. EXAM: CT ANGIOGRAPHY CHEST WITH CONTRAST TECHNIQUE: Multidetector CT imaging of the chest was performed using the standard protocol during bolus administration of intravenous contrast. Multiplanar CT image reconstructions and MIPs were obtained to evaluate the vascular anatomy. CONTRAST:  184m ISOVUE-370 IOPAMIDOL (ISOVUE-370) INJECTION 76% COMPARISON:  Chest CT 04/24/2017, radiograph 05/31/2017 FINDINGS: Cardiovascular: No filling defects within the pulmonary arteries to suggest acute pulmonary embolism. No acute findings of the aorta or great vessels. No pericardial fluid. Mediastinum/Nodes: No axillary or supraclavicular adenopathy. No mediastinal hilar adenopathy. No pericardial effusion. Esophagus Lungs/Pleura: There confluent perihilar airspace disease in the RIGHT upper lobe, RIGHT middle lobe and RIGHT lower lobe. Patchy airspace disease in the LEFT upper lobe and LEFT lower lobe additionally. No pneumothorax. No pleural fluid Upper Abdomen: Limited view of the liver, kidneys, pancreas are unremarkable. Normal adrenal glands. Musculoskeletal: No aggressive osseous lesion. Review of the MIP images confirms the above findings. IMPRESSION: 1. Confluent perihilar airspace disease in the RIGHT lung to lesser degree LEFT lung is concerning for multifocal pneumonia. Flash pulmonary edema could have a similar pattern. 2. No acute pulmonary embolism. Electronically Signed   By: SSuzy BouchardM.D.   On: 05/31/2017 23:39    Pending Labs Unresulted Labs (From admission, onward)   Start     Ordered   06/01/17 0500  HIV antibody (Routine Screening)  Tomorrow morning,   R     06/01/17 0140   06/01/17 0500  Hemoglobin A1c  Once,   R  Comments:  Cancel if has been done within past month and notify Meyer    06/01/17 0153   06/01/17 0500  Culture, respiratory (NON-Expectorated)  Once,   R     06/01/17 0500   06/01/17 0140   Culture, blood (x 2)  BLOOD CULTURE X 2,   STAT    Comments:  INITIATE ANTIBIOTICS WITHIN 1 HOUR AFTER BLOOD CULTURES DRAWN.  If unable to obtain blood cultures, call Meyer immediately regarding antibiotic instructions.    06/01/17 0140   05/31/17 2214  Troponin I (q 6hr x 3)  Now then every 6 hours,   R     05/31/17 2213   05/31/17 2128  CBC with Differential/Platelet  Add-on,   R     05/31/17 2127      Vitals/Pain Today's Vitals   06/01/17 0809 06/01/17 0845 06/01/17 0911 06/01/17 1159  BP:   (!) 146/102 106/83  Pulse:  100 (!) 101 (!) 103  Resp:  15 (!) 21 (!) 27  Temp:      TempSrc:      SpO2:  97% 98% 97%  PainSc: 0-No pain       Isolation Precautions No active isolations  Medications Medications  cefTRIAXone (ROCEPHIN) 2 g in sodium chloride 0.9 % 100 mL IVPB (has no administration in time range)  atorvastatin (LIPITOR) tablet 40 mg (has no administration in time range)  carvedilol (COREG) tablet 6.25 mg (6.25 mg Oral Given 06/01/17 0924)  gabapentin (NEURONTIN) capsule 300 mg (300 mg Oral Given 06/01/17 0407)  hydrALAZINE (APRESOLINE) tablet 10 mg (10 mg Oral Given 06/01/17 0925)  acetaminophen (TYLENOL) tablet 650 mg (has no administration in time range)    Or  acetaminophen (TYLENOL) suppository 650 mg (has no administration in time range)  HYDROcodone-acetaminophen (NORCO/VICODIN) 5-325 MG per tablet 1-2 tablet (has no administration in time range)  ondansetron (ZOFRAN) tablet 4 mg (has no administration in time range)    Or  ondansetron (ZOFRAN) injection 4 mg (has no administration in time range)  insulin aspart (novoLOG) injection 0-5 Units (5 Units Subcutaneous Given 06/01/17 0406)  insulin aspart (novoLOG) injection 0-9 Units (7 Units Subcutaneous Given 06/01/17 0823)  levalbuterol (XOPENEX) nebulizer solution 0.63 mg (has no administration in time range)  iopamidol (ISOVUE-370) 76 % injection (has no administration in time range)  Insulin Glargine (LANTUS) Solostar  Pen 20 Units (has no administration in time range)  insulin aspart (novoLOG) injection 4 Units (has no administration in time range)  azithromycin (ZITHROMAX) tablet 500 mg (has no administration in time range)  cefTRIAXone (ROCEPHIN) 1 g in sodium chloride 0.9 % 100 mL IVPB (0 g Intravenous Stopped 05/31/17 2119)  azithromycin (ZITHROMAX) tablet 500 mg (500 mg Oral Given 05/31/17 1946)  sodium chloride 0.9 % bolus 1,000 mL (0 mLs Intravenous Stopped 05/31/17 2159)  insulin aspart (novoLOG) injection 6 Units (6 Units Subcutaneous Given 05/31/17 1948)  ipratropium-albuterol (DUONEB) 0.5-2.5 (3) MG/3ML nebulizer solution 3 mL (3 mLs Nebulization Given 05/31/17 2136)  methylPREDNISolone sodium succinate (SOLU-MEDROL) 125 mg/2 mL injection 125 mg (125 mg Intravenous Given 05/31/17 2139)  benzonatate (TESSALON) capsule 100 mg (100 mg Oral Given 05/31/17 2158)  iopamidol (ISOVUE-370) 76 % injection 100 mL (100 mLs Intravenous Contrast Given 05/31/17 2325)  magnesium sulfate IVPB 2 g 50 mL (0 g Intravenous Stopped 06/01/17 1035)    Mobility walks

## 2017-06-02 DIAGNOSIS — Z794 Long term (current) use of insulin: Secondary | ICD-10-CM

## 2017-06-02 DIAGNOSIS — E1142 Type 2 diabetes mellitus with diabetic polyneuropathy: Secondary | ICD-10-CM

## 2017-06-02 DIAGNOSIS — E1165 Type 2 diabetes mellitus with hyperglycemia: Secondary | ICD-10-CM

## 2017-06-02 LAB — BASIC METABOLIC PANEL
Anion gap: 12 (ref 5–15)
BUN: 21 mg/dL — ABNORMAL HIGH (ref 6–20)
CALCIUM: 8.6 mg/dL — AB (ref 8.9–10.3)
CHLORIDE: 101 mmol/L (ref 101–111)
CO2: 21 mmol/L — AB (ref 22–32)
CREATININE: 0.96 mg/dL (ref 0.44–1.00)
GFR calc Af Amer: >60 mL/min (ref >60)
GFR calc non Af Amer: >60 mL/min (ref >60)
GLUCOSE: 344 mg/dL — AB (ref 65–99)
Potassium: 4.4 mmol/L (ref 3.5–5.1)
Sodium: 134 mmol/L — ABNORMAL LOW (ref 135–145)

## 2017-06-02 LAB — CBC WITH DIFFERENTIAL/PLATELET
Basophils Absolute: 0 10*3/uL (ref 0.0–0.1)
Basophils Relative: 0 %
Eosinophils Absolute: 0 10*3/uL (ref 0.0–0.7)
Eosinophils Relative: 0 %
HEMATOCRIT: 35 % — AB (ref 36.0–46.0)
Hemoglobin: 11.5 g/dL — ABNORMAL LOW (ref 12.0–15.0)
LYMPHS ABS: 1.6 10*3/uL (ref 0.7–4.0)
LYMPHS PCT: 7 %
MCH: 28.4 pg (ref 26.0–34.0)
MCHC: 32.9 g/dL (ref 30.0–36.0)
MCV: 86.4 fL (ref 78.0–100.0)
MONO ABS: 0.6 10*3/uL (ref 0.1–1.0)
MONOS PCT: 3 %
NEUTROS ABS: 22.3 10*3/uL — AB (ref 1.7–7.7)
Neutrophils Relative %: 90 %
Platelets: 315 10*3/uL (ref 150–400)
RBC: 4.05 MIL/uL (ref 3.87–5.11)
RDW: 13.9 % (ref 11.5–15.5)
WBC: 24.6 10*3/uL — ABNORMAL HIGH (ref 4.0–10.5)

## 2017-06-02 LAB — MAGNESIUM: Magnesium: 2 mg/dL (ref 1.7–2.4)

## 2017-06-02 LAB — GLUCOSE, CAPILLARY
Glucose-Capillary: 341 mg/dL — ABNORMAL HIGH (ref 65–99)
Glucose-Capillary: 308 mg/dL — ABNORMAL HIGH (ref 65–99)

## 2017-06-02 MED ORDER — ALBUTEROL SULFATE HFA 108 (90 BASE) MCG/ACT IN AERS: 2.0000 | INHALATION_SPRAY | Freq: Four times a day (QID) | RESPIRATORY_TRACT | 1 refills | Status: DC | PRN

## 2017-06-02 MED ORDER — FUROSEMIDE 10 MG/ML IJ SOLN
40.0000 mg | Freq: Once | INTRAMUSCULAR | Status: AC
  Administered 2017-06-02: 40 mg via INTRAVENOUS
  Filled 2017-06-02: qty 4

## 2017-06-02 MED ORDER — CEFDINIR 300 MG PO CAPS: 300.0000 mg | ORAL_CAPSULE | Freq: Two times a day (BID) | ORAL | 0 refills | Status: AC

## 2017-06-02 MED ORDER — GUAIFENESIN-DM 100-10 MG/5ML PO SYRP
5.0000 mL | ORAL_SOLUTION | ORAL | Status: DC | PRN
  Administered 2017-06-02: 5 mL via ORAL
  Filled 2017-06-02: qty 10

## 2017-06-02 MED ORDER — INSULIN GLARGINE 100 UNIT/ML ~~LOC~~ SOLN
20.0000 [IU] | Freq: Two times a day (BID) | SUBCUTANEOUS | Status: DC
  Administered 2017-06-02: 20 [IU] via SUBCUTANEOUS
  Filled 2017-06-02 (×2): qty 0.2

## 2017-06-02 MED ORDER — INSULIN GLARGINE 100 UNIT/ML SOLOSTAR PEN
30.0000 [IU] | PEN_INJECTOR | Freq: Every day | SUBCUTANEOUS | 0 refills | Status: DC
Start: 2017-06-02 — End: 2017-07-26

## 2017-06-02 MED ORDER — AZITHROMYCIN 500 MG PO TABS: ORAL_TABLET | ORAL | 0 refills | Status: DC

## 2017-06-02 MED ORDER — GUAIFENESIN-DM 100-10 MG/5ML PO SYRP: 5.0000 mL | ORAL_SOLUTION | ORAL | 0 refills | Status: DC | PRN

## 2017-06-02 NOTE — Progress Notes (Addendum)
Inpatient Diabetes Program Recommendations  AACE/ADA: New Consensus Statement on Inpatient Glycemic Control (2015)  Target Ranges:  Prepandial:   less than 140 mg/dL      Peak postprandial:   less than 180 mg/dL (1-2 hours)      Critically ill patients:  140 - 180 mg/dL   Results for [name], [name] (MRN [id number]) as of 06/02/2017 10:44  Ref. Range 06/01/2017 08:13 06/01/2017 12:11 06/01/2017 14:35 06/01/2017 16:11 06/01/2017 16:32 06/01/2017 21:29 06/02/2017 08:01  Glucose-Capillary Latest Ref Range: 65 - 99 mg/dL 048 (H) 889 (H) 169 (H) 386 (H) 412 (H) 381 (H) 341 (H)  Results for [name], [name] (MRN [id number]) as of 06/02/2017 10:44  Ref. Range 02/21/2017 15:16 06/01/2017 05:00  Hemoglobin A1C Latest Ref Range: 4.8 - 5.6 % 12.7 10.6 (H)   Review of Glycemic Control  Diabetes history: DM2 Outpatient Diabetes medications: Lantus 16 units daily, Metformin XR 1000 mg BID Current orders for Inpatient glycemic control: Lantus 20 units BID, Novolog 0-20 units TID with meals, Novolog 0-5 units QHS, Novolog 8 units TID with meals  Inpatient Diabetes Program Recommendations:  Insulin: Noted Lantus increased, Novolog meal coverage added, and Novolog correction scale increased. Steroids have been discontinued so may need to adjust insulin. Will continue to follow. HgbA1C: A1C was 12.7% on 02/21/17 at last office visit at [facility]. Current A1C 10.6% on 06/01/17. Patient needs outpatient DM medications adjusted to improve glycemic control.  NOTE: Consult for Diabetes Coordinator noted. Diabetes Coordinator is not on campus over the weekend but available by pager for questions or concerns from 8am to 5pm.  Chart reviewed. Patient was last seen at [facility] on 02/21/17 and A1C was 12.7% at that time. Patient was instructed to increase Lantus from 12  to 16 units daily and to titrate every 3 days until fasting glucose less than 150 mg/dl. Initial glucose 359 mg/dl and  fasting glucose 003 mg/dl on 4/91/79. Steroids have been discontinued, Lantus increased, Novolog meal coverage added, and Novolog correction scale increased.   Addendum 06/02/17@11 :56-Received page from MD regarding discharge recommendations for DM control. No other steroids ordered so anticipate glucose to improve; however A1C 10.6% indicating further DM medication adjustments need to be made.  Recommend discharging on Lantus 30 units daily and resume Metformin XR 1000 mg BID, have patient check glucose at least 2 times per day, and have patient follow up with [facility].   Thanks, Orlando Penner, RN, MSN, CDE Diabetes Coordinator Inpatient Diabetes Program [phone] (Team Pager from 8am to 5pm)

## 2017-06-02 NOTE — Progress Notes (Signed)
Subjective:  Abdominal pain and aches all over   Objective:  Vitals:   06/01/17 1348 06/01/17 1349 06/01/17 2127 06/02/17 0412  BP: 121/69 120/81 111/90 114/82  Pulse: 97 97 94 87  Resp:   18 18  Temp:      TempSrc:      SpO2: 98% 98% 98% 99%  Weight:      Height:        Intake/Output from previous day:  Intake/Output Summary (Last 24 hours) at 06/02/2017 1610 Last data filed at 06/01/2017 1500 Gross per 24 hour  Intake 240 ml  Output -  Net 240 ml    Physical Exam: Affect appropriate Obese black female  HEENT: normal Neck supple with no adenopathy JVP normal no bruits no thyromegaly Lungs clear with no wheezing and good diaphragmatic motion Heart:  S1/S2 no murmur, no rub, gallop or click PMI normal Abdomen: benighn, BS positve, no tenderness, no AAA no bruit.  No HSM or HJR Distal pulses intact with no bruits No edema Neuro non-focal Skin warm and dry No muscular weakness   Lab Results: Basic Metabolic Panel: Recent Labs    06/01/17 0505 06/02/17 0530  NA 136 134*  K 3.9 4.4  CL 103 101  CO2 20* 21*  GLUCOSE 403* 344*  BUN 13 21*  CREATININE 0.87 0.96  CALCIUM 8.5* 8.6*  MG 1.4* 2.0  PHOS 3.0  --    Liver Function Tests: Recent Labs    06/01/17 0505  AST 21  ALT 14  ALKPHOS 77  BILITOT 1.9*  PROT 6.8  ALBUMIN 3.1*   No results for input(s): LIPASE, AMYLASE in the last 72 hours. CBC: Recent Labs    06/01/17 0340 06/01/17 0505 06/02/17 0530  WBC 15.3* 14.5* 24.6*  NEUTROABS 14.4*  --  22.3*  HGB 11.9* 11.7* 11.5*  HCT 36.9 36.4 35.0*  MCV 85.2 85.4 86.4  PLT 357 368 315   Cardiac Enzymes: Recent Labs    05/31/17 2214 06/01/17 0355 06/01/17 1407  TROPONINI 0.07* 0.06* 0.05*   BNP: Invalid input(s): POCBNP D-Dimer: Recent Labs    06/01/17 0340  DDIMER 0.83*   Hemoglobin A1C: Recent Labs    06/01/17 0500  HGBA1C 10.6*   Fasting Lipid Panel: No results for input(s): CHOL, HDL, LDLCALC, TRIG, CHOLHDL,  LDLDIRECT in the last 72 hours. Thyroid Function Tests: Recent Labs    06/01/17 0505  TSH 0.656   Anemia Panel: No results for input(s): VITAMINB12, FOLATE, FERRITIN, TIBC, IRON, RETICCTPCT in the last 72 hours.  Imaging: Dg Chest 2 View  Result Date: 05/31/2017 CLINICAL DATA:  Chest pain cough and short of breath EXAM: CHEST - 2 VIEW COMPARISON:  04/02/2017 FINDINGS: Patchy right lower lobe infiltrate is new. Left lung clear. Cardiac enlargement without heart failure or effusion. IMPRESSION: Moderately large infiltrate on the right, most likely pneumonia. Electronically Signed   By: Marlan Palau M.D.   On: 05/31/2017 14:02   Ct Angio Chest Pe W Or Wo Contrast  Result Date: 05/31/2017 CLINICAL DATA:  Cough for several days. Concern for pulmonary embolism. EXAM: CT ANGIOGRAPHY CHEST WITH CONTRAST TECHNIQUE: Multidetector CT imaging of the chest was performed using the standard protocol during bolus administration of intravenous contrast. Multiplanar CT image reconstructions and MIPs were obtained to evaluate the vascular anatomy. CONTRAST:  ISOVUE-370 IOPAMIDOL (ISOVUE-370) INJECTION 76% COMPARISON:  Chest CT 04/24/2017, radiograph 05/31/2017 FINDINGS: Cardiovascular: No filling defects within the pulmonary arteries to suggest acute pulmonary embolism. No acute  findings of the aorta or great vessels. No pericardial fluid. Mediastinum/Nodes: No axillary or supraclavicular adenopathy. No mediastinal hilar adenopathy. No pericardial effusion. Esophagus Lungs/Pleura: There confluent perihilar airspace disease in the RIGHT upper lobe, RIGHT middle lobe and RIGHT lower lobe. Patchy airspace disease in the LEFT upper lobe and LEFT lower lobe additionally. No pneumothorax. No pleural fluid Upper Abdomen: Limited view of the liver, kidneys, pancreas are unremarkable. Normal adrenal glands. Musculoskeletal: No aggressive osseous lesion. Review of the MIP images confirms the above findings. IMPRESSION:  1. Confluent perihilar airspace disease in the RIGHT lung to lesser degree LEFT lung is concerning for multifocal pneumonia. Flash pulmonary edema could have a similar pattern. 2. No acute pulmonary embolism. Electronically Signed   By: Genevive Bi M.D.   On: 05/31/2017 23:39    Cardiac Studies:  ECG: Sinus tachycardia rate 117 nonspecific ST chagnes    Telemetry:  NSR 06/02/2017   Echo: EF 25-30%   Medications:   . atorvastatin  40 mg Oral q1800  . azithromycin  500 mg Oral QHS  . carvedilol  6.25 mg Oral BID WC  . gabapentin  300 mg Oral QHS  . hydrALAZINE  10 mg Oral TID  . insulin aspart  0-20 Units Subcutaneous TID WC  . insulin aspart  0-5 Units Subcutaneous QHS  . insulin aspart  8 Units Subcutaneous TID WC  . insulin glargine  20 Units Subcutaneous Q2200     . cefTRIAXone (ROCEPHIN)  IV Stopped (06/01/17 2141)    Assessment/Plan:   CHF:  Chronic over 6 years angioedema with ACE continue coreg and hydralazine add daily iv lasix  Pneumonia: follow CXR multilobar per CT on Rocephin and Azithromycin  DM:  Discussed low carb diet.  Target hemoglobin A1c is 6.5 or less.  Continue current medications. HLD:  Continue statin   Charlton Haws 06/02/2017, 8:11 AM

## 2017-06-02 NOTE — Discharge Summary (Signed)
Physician Discharge Summary  [name] NGE:[id number] DOB: 1965/02/24 DOA: 05/31/2017  PCP: Ladell Pier, MD  Admit date: 05/31/2017 Discharge date: 06/02/2017  Admitted From: Home Disposition:  Home  Discharge Condition:Stable CODE STATUS:Full Diet recommendation: Heart Healthy / Carb Modified   Brief/Interim Summary:  Patient is a 52 year old female with past medical history of diabetes type 2, chronic  combined systolic and diastolic CHF, hyperlipidemia, lung nodules, hypertension, history of carotid dissection, asthma, CVA who presented to the emergency department with complaints of cough for 2 days and blood-tinged sputum along with shortness of breath.  Patient was admitted for further management of pneumonia here. Started on IV antibiotics.  Patient respiratory status remarkably improved the next day.  Currently she is saturating fine on room air.  Denies any shortness of breath.  Her lungs are clear.  Patient was also noted to have elevated hemoglobin A1c level and elevated blood glucose level.  Her insulin was adjusted during discharge.  She is stable for discharge to home with oral antibiotics.  Following problems were addressed during her hospitalization:   Community-acquired pneumonia: Chest imagings done on admission were suggestive of multifocal pneumonia.  We will treat at community aqcuired pneumonia. Currently on ceftriaxone and azithromycin.  Cultures negative so far.  Respiratory status significantly improved this morning.  Currently she is saturating fine on room air.  Does not complain of any shortness of breath. We will change antibiotics to oral.  Hemoptysis: Resolved.  Patient was complaining of blood-tinged sputum which has improved .Most likely associated  with community-acquired pneumonia.  History of MWN:[id number] was following DCM/CHForiginally diagnosed in April 2013 .Cardiac catheterization showed normal coronary arteries.Repeat echoEF30-35%  on 05/2016,she was referred for ICD but decided against it. She has been compliant her Coreg and Lasix but has been forgetting to take her other medications. Not on ARB secondary to angioedema. Patient does look volume overloaded.  Lasix held on admission because she was dehydrated.  Will resume on discharge. Patient also has a mildly elevated troponin most likely associated with  CHF.  Denies any chest pain.  History of asthma: Currently stable.  No wheezing.  Steroids discontinued .Her lungs are clear this morning.  Hypertension: Currently blood pressure stable.  Will resume her home medications.    Diabetes type 2:  Diabetic coordinator consulted.  Insulin adjusted.  Continue to monitor her blood glucose at home  Hyperlipidemia: Continue statin.  Diabetic polyneuropathy: Continue Neurontin  History of left common carotid artery dissection : Healed on most recent CT scan.  Hypomagnesemia: Supplemented.      Discharge Diagnoses:  Active Problems:   Essential hypertension   Dyslipidemia associated with type 2 diabetes mellitus ([place])   DCM (dilated cardiomyopathy) ([place])   Uncontrolled type 2 diabetes mellitus without complication, with long-term current use of insulin ([place])   Diabetic polyneuropathy associated with type 2 diabetes mellitus ([place])   CAP (community acquired pneumonia)   Hemoptysis   Asthma    Discharge Instructions  Discharge Instructions    Diet - low sodium heart healthy   Complete by:  As directed    Discharge instructions   Complete by:  As directed    1) [facility] up at [facility] within a week. 2) Take prescribed medications as instructed. 3) Do a CBC  test during a follow-up with your PCP. 4) Continue taking your home medications. 5) Monitor your blood glucose at home.   Increase activity slowly   Complete by:  As  directed      Allergies as of 06/02/2017      Reactions   Lisinopril Hives, Swelling    Facial    Sertraline Hives, Swelling   Facial    Tramadol Hives, Swelling   facial   Ibuprofen Hives, Swelling      Medication List    STOP taking these medications   benzonatate 100 MG capsule Commonly known as:  TESSALON   isosorbide mononitrate 30 MG 24 hr tablet Commonly known as:  IMDUR   nystatin cream Commonly known as:  MYCOSTATIN   predniSONE 20 MG tablet Commonly known as:  DELTASONE   spironolactone 25 MG tablet Commonly known as:  ALDACTONE   triamcinolone cream 0.1 % Commonly known as:  KENALOG     TAKE these medications   albuterol 108 (90 Base) MCG/ACT inhaler Commonly known as:  PROVENTIL HFA;VENTOLIN HFA Inhale 1 puff into the lungs every 6 (six) hours as needed for wheezing or shortness of breath.   aspirin 81 MG chewable tablet Chew 1 tablet (81 mg total) by mouth daily.   atorvastatin 80 MG tablet Commonly known as:  LIPITOR Take 0.5 tablets (40 mg total) by mouth daily at 6 PM.   azithromycin 500 MG tablet Commonly known as:  ZITHROMAX Take daily   blood glucose meter kit and supplies Dispense based on patient and insurance preference. Use up to four times daily as directed. (FOR ICD-9 250.00, 250.01).   carvedilol 6.25 MG tablet Commonly known as:  COREG Take 1 tablet (6.25 mg total) by mouth 2 (two) times daily with a meal.   cefdinir 300 MG capsule Commonly known as:  OMNICEF Take 1 capsule (300 mg total) by mouth 2 (two) times daily for 5 days.   furosemide 40 MG tablet Commonly known as:  LASIX Take 1 tablet (40 mg total) by mouth 2 (two) times daily.   gabapentin 300 MG capsule Commonly known as:  NEURONTIN Take 1 capsule (300 mg total) by mouth at bedtime.   glucose blood test strip Commonly known as:  TRUE METRIX BLOOD GLUCOSE TEST Use as instructed   guaiFENesin-dextromethorphan 100-10 MG/5ML syrup Commonly known as:  ROBITUSSIN DM Take 5 mLs by mouth every 4 (four) hours as needed for cough.   hydrALAZINE 10 MG  tablet Commonly known as:  APRESOLINE Take 1 tablet (10 mg total) by mouth 3 (three) times daily.   Insulin Glargine 100 UNIT/ML Solostar Pen Commonly known as:  LANTUS SOLOSTAR Inject 30 Units into the skin daily at 10 pm. At bedtime What changed:    how much to take  when to take this   metFORMIN 500 MG 24 hr tablet Commonly known as:  GLUCOPHAGE XR Take 2 tablets (1,000 mg total) by mouth 2 (two) times daily with a meal.   TRUE METRIX METER w/Device Kit Use as directed   TRUEPLUS LANCETS 28G Misc Use as directed   TRUEPLUS PEN NEEDLES 31G X 6 MM Misc Generic drug:  Insulin Pen Needle USE AS DIRECTED      Follow-up Information    Ladell Pier, MD. Schedule an appointment as soon as possible for a visit in 1 week(s).   Specialty:  Internal Medicine Contact information: [address] [place] [address] [phone]          Allergies  Allergen Reactions  . Lisinopril Hives and Swelling    Facial   . Sertraline Hives and Swelling    Facial   . Tramadol Hives  and Swelling    facial  . Ibuprofen Hives and Swelling    Consultations:  None   Procedures/Studies: Dg Chest 2 View  Result Date: 05/31/2017 CLINICAL DATA:  Chest pain cough and short of breath EXAM: CHEST - 2 VIEW COMPARISON:  04/02/2017 FINDINGS: Patchy right lower lobe infiltrate is new. Left lung clear. Cardiac enlargement without heart failure or effusion. IMPRESSION: Moderately large infiltrate on the right, most likely pneumonia. Electronically Signed   By: Franchot Gallo M.D.   On: 05/31/2017 14:02   Ct Angio Chest Pe W Or Wo Contrast  Result Date: 05/31/2017 CLINICAL DATA:  Cough for several days. Concern for pulmonary embolism. EXAM: CT ANGIOGRAPHY CHEST WITH CONTRAST TECHNIQUE: Multidetector CT imaging of the chest was performed using the standard protocol during bolus administration of intravenous contrast. Multiplanar CT image reconstructions and MIPs were obtained to  evaluate the vascular anatomy. CONTRAST:  174m ISOVUE-370 IOPAMIDOL (ISOVUE-370) INJECTION 76% COMPARISON:  Chest CT 04/24/2017, radiograph 05/31/2017 FINDINGS: Cardiovascular: No filling defects within the pulmonary arteries to suggest acute pulmonary embolism. No acute findings of the aorta or great vessels. No pericardial fluid. Mediastinum/Nodes: No axillary or supraclavicular adenopathy. No mediastinal hilar adenopathy. No pericardial effusion. Esophagus Lungs/Pleura: There confluent perihilar airspace disease in the RIGHT upper lobe, RIGHT middle lobe and RIGHT lower lobe. Patchy airspace disease in the LEFT upper lobe and LEFT lower lobe additionally. No pneumothorax. No pleural fluid Upper Abdomen: Limited view of the liver, kidneys, pancreas are unremarkable. Normal adrenal glands. Musculoskeletal: No aggressive osseous lesion. Review of the MIP images confirms the above findings. IMPRESSION: 1. Confluent perihilar airspace disease in the RIGHT lung to lesser degree LEFT lung is concerning for multifocal pneumonia. Flash pulmonary edema could have a similar pattern. 2. No acute pulmonary embolism. Electronically Signed   By: SSuzy BouchardM.D.   On: 05/31/2017 23:39      Subjective: Patient seen and examined the bedside this morning.  Remains comfortable.  Denies any complaints.  Respiratory status is stable.  Discharge Exam: Vitals:   06/02/17 0412 06/02/17 0858  BP: 114/82 113/65  Pulse: 87 93  Resp: 18   Temp:    SpO2: 99%    Vitals:   06/01/17 1349 06/01/17 2127 06/02/17 0412 06/02/17 0858  BP: 120/81 111/90 114/82 113/65  Pulse: 97 94 87 93  Resp:  18 18   Temp:      TempSrc:      SpO2: 98% 98% 99%   Weight:      Height:        General: Pt is alert, awake, not in acute distress Cardiovascular: RRR, S1/S2 +, no rubs, no gallops Respiratory: CTA bilaterally, no wheezing, no rhonchi Abdominal: Soft, NT, ND, bowel sounds + Extremities: no edema, no cyanosis    The  results of significant diagnostics from this hospitalization (including imaging, microbiology, ancillary and laboratory) are listed below for reference.     Microbiology: Recent Results (from the past 240 hour(s))  Culture, respiratory (NON-Expectorated)     Status: None (Preliminary result)   Collection Time: 06/01/17  5:00 AM  Result Value Ref Range Status   Specimen Description   Final    SPUTUM [facility]formed at [facility] [place] [address]   Special Requests   Final    NONE [facility]formed at [facility] [address], [place]   Gram Stain   Final    FEW WBC PRESENT, PREDOMINANTLY  MONONUCLEAR FEW SQUAMOUS EPITHELIAL CELLS PRESENT FEW GRAM POSITIVE COCCI IN PAIRS FEW GRAM POSITIVE RODS RARE GRAM NEGATIVE RODS    Culture   Final    CULTURE REINCUBATED FOR BETTER GROWTH Performed at [facility], [place] [address] [place], [place] [address]    Report Status PENDING  Incomplete  Culture, blood (x 2)     Status: None (Preliminary result)   Collection Time: 06/01/17  2:07 PM  Result Value Ref Range Status   Specimen Description BLOOD RIGHT HAND  Final   Special Requests   Final    BOTTLES DRAWN AEROBIC ONLY Blood Culture adequate volume   Culture PENDING  Incomplete   Report Status PENDING  Incomplete     Labs: BNP (last 3 results) Recent Labs    06/10/16 0822  BNP 035.5*   Basic Metabolic Panel: Recent Labs  Lab 05/31/17 1417 06/01/17 0505 06/02/17 0530  NA 134* 136 134*  K 4.2 3.9 4.4  CL 102 103 101  CO2 19* 20* 21*  GLUCOSE 359* 403* 344*  BUN 11 13 21*  CREATININE 1.01* 0.87 0.96  CALCIUM 8.9 8.5* 8.6*  MG  --  1.4* 2.0  PHOS  --  3.0  --    Liver Function Tests: Recent Labs  Lab 06/01/17 0505  AST 21  ALT 14  ALKPHOS 77  BILITOT 1.9*  PROT 6.8  ALBUMIN 3.1*   No results for input(s): LIPASE, AMYLASE in the last 168 hours. No results for input(s): AMMONIA in  the last 168 hours. CBC: Recent Labs  Lab 05/31/17 1417 06/01/17 0340 06/01/17 0505 06/02/17 0530  WBC 18.6* 15.3* 14.5* 24.6*  NEUTROABS  --  14.4*  --  22.3*  HGB 12.8 11.9* 11.7* 11.5*  HCT 38.0 36.9 36.4 35.0*  MCV 84.6 85.2 85.4 86.4  PLT 429* 357 368 315   Cardiac Enzymes: Recent Labs  Lab 05/31/17 2214 06/01/17 0355 06/01/17 1407  TROPONINI 0.07* 0.06* 0.05*   BNP: Invalid input(s): POCBNP CBG: Recent Labs  Lab 06/01/17 1611 06/01/17 1632 06/01/17 2129 06/02/17 0801 06/02/17 1200  GLUCAP 386* 412* 381* 341* 308*   D-Dimer Recent Labs    06/01/17 0340  DDIMER 0.83*   Hgb A1c Recent Labs    06/01/17 0500  HGBA1C 10.6*   Lipid Profile No results for input(s): CHOL, HDL, LDLCALC, TRIG, CHOLHDL, LDLDIRECT in the last 72 hours. Thyroid function studies Recent Labs    06/01/17 0505  TSH 0.656   Anemia work up No results for input(s): VITAMINB12, FOLATE, FERRITIN, TIBC, IRON, RETICCTPCT in the last 72 hours. Urinalysis    Component Value Date/Time   COLORURINE YELLOW 07/02/2007 1121   APPEARANCEUR HAZY (A) 07/02/2007 1121   LABSPEC >1.030 (H) 07/02/2007 1121   PHURINE 5.5 07/02/2007 1121   GLUCOSEU NEGATIVE 07/02/2007 1121   HGBUR LARGE (A) 07/02/2007 1121   BILIRUBINUR negative 02/21/2017 [place] 07/02/2007 1121   PROTEINUR negative 02/21/2017 1544   PROTEINUR NEGATIVE 07/02/2007 1121   UROBILINOGEN 0.2 02/21/2017 1544   UROBILINOGEN 0.2 07/02/2007 1121   NITRITE negative 02/21/2017 1544   NITRITE NEGATIVE 07/02/2007 1121   LEUKOCYTESUR Negative 02/21/2017 1544   Sepsis Labs Invalid input(s): PROCALCITONIN,  WBC,  LACTICIDVEN Microbiology Recent Results (from the past 240 hour(s))  Culture, respiratory (NON-Expectorated)     Status: None (Preliminary result)   Collection Time: 06/01/17  5:00 AM  Result Value Ref Range Status   Specimen Description   Final  SPUTUM Performed at [facility], [place] [address] [place], [place] [address]    Special Requests   Final    NONE Performed at [facility], [place] [address], [place], [place] [address]    Gram Stain   Final    FEW WBC PRESENT, PREDOMINANTLY MONONUCLEAR FEW SQUAMOUS EPITHELIAL CELLS PRESENT FEW GRAM POSITIVE COCCI IN PAIRS FEW GRAM POSITIVE RODS RARE GRAM NEGATIVE RODS    Culture   Final    CULTURE REINCUBATED FOR BETTER GROWTH Performed at [facility], [place] [address], [place], [place] [address]    Report Status PENDING  Incomplete  Culture, blood (x 2)     Status: None (Preliminary result)   Collection Time: 06/01/17  2:07 PM  Result Value Ref Range Status   Specimen Description BLOOD RIGHT HAND  Final   Special Requests   Final    BOTTLES DRAWN AEROBIC ONLY Blood Culture adequate volume   Culture PENDING  Incomplete   Report Status PENDING  Incomplete     Time coordinating discharge: 35 minutes  SIGNED:   Shelly Coss, MD  [facility] 06/02/2017, 12:30 PM Pager [phone]  If 7PM-7AM, please contact night-coverage [email or web address] Password [email or web address]

## 2017-06-04 LAB — CULTURE, RESPIRATORY: CULTURE: NORMAL

## 2017-06-04 LAB — CULTURE, RESPIRATORY W GRAM STAIN

## 2017-06-06 LAB — CULTURE, BLOOD (ROUTINE X 2)
Culture: NO GROWTH
Culture: NO GROWTH
SPECIAL REQUESTS: ADEQUATE
Special Requests: ADEQUATE

## 2017-06-18 MED FILL — CARVEDILOL 6.25 MG TABLET: 6.25 | 30 days supply | Qty: 60 | Fill #7

## 2017-06-18 MED FILL — hydrALAZINE HCL 10 MG TABS: 10 | 30 days supply | Qty: 90 | Fill #1

## 2017-07-19 ENCOUNTER — Emergency Department (HOSPITAL_COMMUNITY): Payer: Medicaid Other

## 2017-07-19 ENCOUNTER — Encounter (HOSPITAL_COMMUNITY): Payer: Self-pay

## 2017-07-19 ENCOUNTER — Inpatient Hospital Stay (HOSPITAL_COMMUNITY): Payer: Medicaid Other

## 2017-07-19 ENCOUNTER — Inpatient Hospital Stay (HOSPITAL_COMMUNITY)
Admission: EM | Admit: 2017-07-19 | Discharge: 2017-07-26 | DRG: 871 | Disposition: A | Discharge status: Home / Self Care | Payer: Medicaid Other | Attending: Family Medicine | Admitting: Family Medicine

## 2017-07-19 ENCOUNTER — Other Ambulatory Visit: Payer: Self-pay

## 2017-07-19 DIAGNOSIS — Z8673 Personal history of transient ischemic attack (TIA), and cerebral infarction without residual deficits: Secondary | ICD-10-CM | POA: Diagnosis not present

## 2017-07-19 DIAGNOSIS — R6521 Severe sepsis with septic shock: Secondary | ICD-10-CM | POA: Diagnosis present

## 2017-07-19 DIAGNOSIS — E1142 Type 2 diabetes mellitus with diabetic polyneuropathy: Secondary | ICD-10-CM

## 2017-07-19 DIAGNOSIS — J9690 Respiratory failure, unspecified, unspecified whether with hypoxia or hypercapnia: Secondary | ICD-10-CM | POA: Diagnosis present

## 2017-07-19 DIAGNOSIS — Y95 Nosocomial condition: Secondary | ICD-10-CM | POA: Diagnosis present

## 2017-07-19 DIAGNOSIS — D631 Anemia in chronic kidney disease: Secondary | ICD-10-CM | POA: Diagnosis present

## 2017-07-19 DIAGNOSIS — Z803 Family history of malignant neoplasm of breast: Secondary | ICD-10-CM | POA: Diagnosis not present

## 2017-07-19 DIAGNOSIS — E872 Acidosis: Secondary | ICD-10-CM | POA: Diagnosis present

## 2017-07-19 DIAGNOSIS — Z794 Long term (current) use of insulin: Secondary | ICD-10-CM

## 2017-07-19 DIAGNOSIS — Z7982 Long term (current) use of aspirin: Secondary | ICD-10-CM

## 2017-07-19 DIAGNOSIS — E118 Type 2 diabetes mellitus with unspecified complications: Secondary | ICD-10-CM

## 2017-07-19 DIAGNOSIS — I5022 Chronic systolic (congestive) heart failure: Secondary | ICD-10-CM

## 2017-07-19 DIAGNOSIS — Z8249 Family history of ischemic heart disease and other diseases of the circulatory system: Secondary | ICD-10-CM | POA: Diagnosis not present

## 2017-07-19 DIAGNOSIS — N170 Acute kidney failure with tubular necrosis: Secondary | ICD-10-CM | POA: Diagnosis present

## 2017-07-19 DIAGNOSIS — A419 Sepsis, unspecified organism: Secondary | ICD-10-CM | POA: Diagnosis not present

## 2017-07-19 DIAGNOSIS — J189 Pneumonia, unspecified organism: Secondary | ICD-10-CM | POA: Diagnosis not present

## 2017-07-19 DIAGNOSIS — J80 Acute respiratory distress syndrome: Secondary | ICD-10-CM | POA: Diagnosis present

## 2017-07-19 DIAGNOSIS — J9601 Acute respiratory failure with hypoxia: Secondary | ICD-10-CM

## 2017-07-19 DIAGNOSIS — Z886 Allergy status to analgesic agent status: Secondary | ICD-10-CM

## 2017-07-19 DIAGNOSIS — J810 Acute pulmonary edema: Secondary | ICD-10-CM

## 2017-07-19 DIAGNOSIS — Z888 Allergy status to other drugs, medicaments and biological substances status: Secondary | ICD-10-CM

## 2017-07-19 DIAGNOSIS — E876 Hypokalemia: Secondary | ICD-10-CM | POA: Diagnosis present

## 2017-07-19 DIAGNOSIS — I428 Other cardiomyopathies: Secondary | ICD-10-CM | POA: Diagnosis present

## 2017-07-19 DIAGNOSIS — IMO0002 Reserved for concepts with insufficient information to code with codable children: Secondary | ICD-10-CM

## 2017-07-19 DIAGNOSIS — G9340 Encephalopathy, unspecified: Secondary | ICD-10-CM | POA: Diagnosis not present

## 2017-07-19 DIAGNOSIS — E785 Hyperlipidemia, unspecified: Secondary | ICD-10-CM | POA: Diagnosis present

## 2017-07-19 DIAGNOSIS — I110 Hypertensive heart disease with heart failure: Secondary | ICD-10-CM | POA: Diagnosis present

## 2017-07-19 DIAGNOSIS — I5021 Acute systolic (congestive) heart failure: Secondary | ICD-10-CM

## 2017-07-19 DIAGNOSIS — E1165 Type 2 diabetes mellitus with hyperglycemia: Secondary | ICD-10-CM | POA: Diagnosis present

## 2017-07-19 DIAGNOSIS — J9600 Acute respiratory failure, unspecified whether with hypoxia or hypercapnia: Secondary | ICD-10-CM

## 2017-07-19 DIAGNOSIS — R5381 Other malaise: Secondary | ICD-10-CM | POA: Diagnosis not present

## 2017-07-19 DIAGNOSIS — I5023 Acute on chronic systolic (congestive) heart failure: Secondary | ICD-10-CM | POA: Diagnosis not present

## 2017-07-19 DIAGNOSIS — Z885 Allergy status to narcotic agent status: Secondary | ICD-10-CM

## 2017-07-19 DIAGNOSIS — R0602 Shortness of breath: Secondary | ICD-10-CM | POA: Diagnosis present

## 2017-07-19 DIAGNOSIS — I10 Essential (primary) hypertension: Secondary | ICD-10-CM

## 2017-07-19 DIAGNOSIS — J811 Chronic pulmonary edema: Secondary | ICD-10-CM

## 2017-07-19 LAB — BASIC METABOLIC PANEL
Anion gap: 15 (ref 5–15)
BUN: 12 mg/dL (ref 6–20)
CHLORIDE: 104 mmol/L (ref 101–111)
CO2: 19 mmol/L — AB (ref 22–32)
Calcium: 8.8 mg/dL — ABNORMAL LOW (ref 8.9–10.3)
Creatinine, Ser: 0.93 mg/dL (ref 0.44–1.00)
GFR calc Af Amer: >60 mL/min (ref >60)
GFR calc non Af Amer: >60 mL/min (ref >60)
GLUCOSE: 273 mg/dL — AB (ref 65–99)
POTASSIUM: 3.6 mmol/L (ref 3.5–5.1)
Sodium: 138 mmol/L (ref 135–145)

## 2017-07-19 LAB — DIC (DISSEMINATED INTRAVASCULAR COAGULATION) PANEL
D DIMER QUANT: 3.15 ug{FEU}/mL — AB (ref 0.00–0.50)
PROTHROMBIN TIME: 15.2 s (ref 11.4–15.2)

## 2017-07-19 LAB — I-STAT ARTERIAL BLOOD GAS, ED
Acid-base deficit: 4 mmol/L — ABNORMAL HIGH (ref 0.0–2.0)
BICARBONATE: 21 mmol/L (ref 20.0–28.0)
O2 Saturation: 99 %
PCO2 ART: 37.7 mmHg (ref 32.0–48.0)
PH ART: 7.354 (ref 7.350–7.450)
TCO2: 22 mmol/L (ref 22–32)
pO2, Arterial: 135 mmHg — ABNORMAL HIGH (ref 83.0–108.0)

## 2017-07-19 LAB — HEMOGLOBIN A1C
HEMOGLOBIN A1C: 10 % — AB (ref 4.8–5.6)
Mean Plasma Glucose: 240.3 mg/dL

## 2017-07-19 LAB — I-STAT CG4 LACTIC ACID, ED
LACTIC ACID, VENOUS: 4.29 mmol/L — AB (ref 0.5–1.9)
Lactic Acid, Venous: 3.06 mmol/L (ref 0.5–1.9)

## 2017-07-19 LAB — CBC
HEMATOCRIT: 38.9 % (ref 36.0–46.0)
Hemoglobin: 12.1 g/dL (ref 12.0–15.0)
MCH: 26.7 pg (ref 26.0–34.0)
MCHC: 31.1 g/dL (ref 30.0–36.0)
MCV: 85.7 fL (ref 78.0–100.0)
Platelets: 441 10*3/uL — ABNORMAL HIGH (ref 150–400)
RBC: 4.54 MIL/uL (ref 3.87–5.11)
RDW: 14.2 % (ref 11.5–15.5)
WBC: 18.9 10*3/uL — AB (ref 4.0–10.5)

## 2017-07-19 LAB — DIC (DISSEMINATED INTRAVASCULAR COAGULATION)PANEL
Fibrinogen: 447 mg/dL (ref 210–475)
INR: 1.21
Platelets: 276 10*3/uL (ref 150–400)
Smear Review: NONE SEEN
aPTT: 25 seconds (ref 24–36)

## 2017-07-19 LAB — MRSA PCR SCREENING: MRSA BY PCR: NEGATIVE

## 2017-07-19 LAB — I-STAT TROPONIN, ED: Troponin i, poc: 0 ng/mL (ref 0.00–0.08)

## 2017-07-19 LAB — GLUCOSE, CAPILLARY
Glucose-Capillary: 203 mg/dL — ABNORMAL HIGH (ref 65–99)
Glucose-Capillary: 156 mg/dL — ABNORMAL HIGH (ref 65–99)
Glucose-Capillary: 177 mg/dL — ABNORMAL HIGH (ref 65–99)

## 2017-07-19 LAB — I-STAT BETA HCG BLOOD, ED (MC, WL, AP ONLY)

## 2017-07-19 LAB — LACTIC ACID, PLASMA
LACTIC ACID, VENOUS: 3.4 mmol/L — AB (ref 0.5–1.9)
LACTIC ACID, VENOUS: 2.7 mmol/L — AB (ref 0.5–1.9)

## 2017-07-19 LAB — TRIGLYCERIDES: Triglycerides: 131 mg/dL (ref <150)

## 2017-07-19 LAB — PROCALCITONIN: PROCALCITONIN: 2.66 ng/mL

## 2017-07-19 LAB — BRAIN NATRIURETIC PEPTIDE: B Natriuretic Peptide: 582.5 pg/mL — ABNORMAL HIGH (ref 0.0–100.0)

## 2017-07-19 MED ORDER — VANCOMYCIN HCL IN DEXTROSE 1-5 GM/200ML-% IV SOLN
1000.0000 mg | Freq: Three times a day (TID) | INTRAVENOUS | Status: DC
  Administered 2017-07-19 – 2017-07-20 (×2): 1000 mg via INTRAVENOUS
  Filled 2017-07-19 (×3): qty 200

## 2017-07-19 MED ORDER — PIPERACILLIN-TAZOBACTAM 3.375 G IVPB 30 MIN
3.3750 g | Freq: Once | INTRAVENOUS | Status: AC
  Administered 2017-07-19: 3.375 g via INTRAVENOUS
  Filled 2017-07-19: qty 50

## 2017-07-19 MED ORDER — MIDAZOLAM HCL 2 MG/2ML IJ SOLN
INTRAMUSCULAR | Status: AC
  Filled 2017-07-19: qty 4

## 2017-07-19 MED ORDER — CHLORHEXIDINE GLUCONATE CLOTH 2 % EX PADS
6.0000 | MEDICATED_PAD | Freq: Every day | CUTANEOUS | Status: DC
  Administered 2017-07-20 – 2017-07-25 (×5): 6 via TOPICAL

## 2017-07-19 MED ORDER — FENTANYL CITRATE (PF) 100 MCG/2ML IJ SOLN
INTRAMUSCULAR | Status: AC | PRN
  Administered 2017-07-19: 100 ug via INTRAVENOUS

## 2017-07-19 MED ORDER — ACETAMINOPHEN 325 MG PO TABS
325.0000 mg | ORAL_TABLET | ORAL | Status: DC | PRN
  Administered 2017-07-19: 325 mg via ORAL
  Filled 2017-07-19: qty 1

## 2017-07-19 MED ORDER — ETOMIDATE 2 MG/ML IV SOLN
INTRAVENOUS | Status: AC | PRN
  Administered 2017-07-19: 20 mg via INTRAVENOUS
  Administered 2017-07-19: 10 mg via INTRAVENOUS

## 2017-07-19 MED ORDER — ENOXAPARIN SODIUM 40 MG/0.4ML ~~LOC~~ SOLN
40.0000 mg | SUBCUTANEOUS | Status: DC
  Filled 2017-07-19: qty 0.4

## 2017-07-19 MED ORDER — INSULIN GLARGINE 100 UNIT/ML ~~LOC~~ SOLN
12.0000 [IU] | Freq: Every day | SUBCUTANEOUS | Status: DC
  Administered 2017-07-19 – 2017-07-25 (×7): 12 [IU] via SUBCUTANEOUS
  Filled 2017-07-19 (×7): qty 0.12

## 2017-07-19 MED ORDER — SODIUM CHLORIDE 0.9 % IV SOLN: INTRAVENOUS | Status: DC

## 2017-07-19 MED ORDER — SODIUM CHLORIDE 0.9% FLUSH
10.0000 mL | INTRAVENOUS | Status: DC | PRN
  Administered 2017-07-19: 10 mL
  Filled 2017-07-19: qty 40

## 2017-07-19 MED ORDER — PIPERACILLIN-TAZOBACTAM 3.375 G IVPB
3.3750 g | Freq: Three times a day (TID) | INTRAVENOUS | Status: DC
  Administered 2017-07-19 – 2017-07-26 (×20): 3.375 g via INTRAVENOUS
  Filled 2017-07-19 (×22): qty 50

## 2017-07-19 MED ORDER — INSULIN ASPART 100 UNIT/ML ~~LOC~~ SOLN
0.0000 [IU] | Freq: Three times a day (TID) | SUBCUTANEOUS | Status: DC
  Administered 2017-07-19: 5 [IU] via SUBCUTANEOUS

## 2017-07-19 MED ORDER — DEXTROSE-NACL 5-0.45 % IV SOLN: INTRAVENOUS | Status: DC

## 2017-07-19 MED ORDER — CHLORHEXIDINE GLUCONATE 0.12% ORAL RINSE (MEDLINE KIT)
15.0000 mL | Freq: Two times a day (BID) | OROMUCOSAL | Status: DC
  Administered 2017-07-19 – 2017-07-25 (×12): 15 mL via OROMUCOSAL

## 2017-07-19 MED ORDER — NOREPINEPHRINE 4 MG/250ML-% IV SOLN
0.0000 ug/min | INTRAVENOUS | Status: DC
  Filled 2017-07-19: qty 250

## 2017-07-19 MED ORDER — SODIUM CHLORIDE 0.9 % IV SOLN: 250.0000 mL | INTRAVENOUS | Status: DC | PRN

## 2017-07-19 MED ORDER — SODIUM CHLORIDE 0.9 % IV BOLUS
1000.0000 mL | Freq: Once | INTRAVENOUS | Status: AC
  Administered 2017-07-19: 1000 mL via INTRAVENOUS

## 2017-07-19 MED ORDER — FENTANYL CITRATE (PF) 100 MCG/2ML IJ SOLN
INTRAMUSCULAR | Status: AC | PRN
  Administered 2017-07-19 (×2): 100 ug via INTRAVENOUS

## 2017-07-19 MED ORDER — LACTATED RINGERS IV SOLN
INTRAVENOUS | Status: DC
  Administered 2017-07-19 – 2017-07-21 (×3): via INTRAVENOUS

## 2017-07-19 MED ORDER — PROPOFOL 1000 MG/100ML IV EMUL
0.0000 ug/kg/min | INTRAVENOUS | Status: DC
  Administered 2017-07-19 (×4): 40 ug/kg/min via INTRAVENOUS
  Administered 2017-07-20: 25 ug/kg/min via INTRAVENOUS
  Administered 2017-07-20: 20 ug/kg/min via INTRAVENOUS
  Administered 2017-07-20: 15 ug/kg/min via INTRAVENOUS
  Filled 2017-07-19: qty 100
  Filled 2017-07-19: qty 200
  Filled 2017-07-19 (×3): qty 100

## 2017-07-19 MED ORDER — FENTANYL CITRATE (PF) 100 MCG/2ML IJ SOLN
INTRAMUSCULAR | Status: AC
  Filled 2017-07-19: qty 2

## 2017-07-19 MED ORDER — SODIUM CHLORIDE 0.9 % IV SOLN
INTRAVENOUS | Status: DC
  Filled 2017-07-19: qty 1

## 2017-07-19 MED ORDER — ONDANSETRON HCL 4 MG/2ML IJ SOLN
4.0000 mg | Freq: Four times a day (QID) | INTRAMUSCULAR | Status: DC | PRN
Start: 2017-07-19 — End: 2017-07-26

## 2017-07-19 MED ORDER — FAMOTIDINE IN NACL 20-0.9 MG/50ML-% IV SOLN
20.0000 mg | Freq: Two times a day (BID) | INTRAVENOUS | Status: DC
  Administered 2017-07-19 – 2017-07-23 (×8): 20 mg via INTRAVENOUS
  Filled 2017-07-19 (×8): qty 50

## 2017-07-19 MED ORDER — SODIUM CHLORIDE 0.9% FLUSH
10.0000 mL | Freq: Two times a day (BID) | INTRAVENOUS | Status: DC
  Administered 2017-07-19 – 2017-07-20 (×2): 10 mL
  Administered 2017-07-20: 20 mL
  Administered 2017-07-21 – 2017-07-23 (×5): 10 mL

## 2017-07-19 MED ORDER — ASPIRIN 300 MG RE SUPP: 300.0000 mg | RECTAL | Status: AC

## 2017-07-19 MED ORDER — DEXTROSE 50 % IV SOLN: 25.0000 mL | INTRAVENOUS | Status: DC | PRN

## 2017-07-19 MED ORDER — SUCCINYLCHOLINE CHLORIDE 20 MG/ML IJ SOLN
INTRAMUSCULAR | Status: AC | PRN
  Administered 2017-07-19: 100 mg via INTRAVENOUS

## 2017-07-19 MED ORDER — INSULIN REGULAR BOLUS VIA INFUSION
0.0000 [IU] | Freq: Three times a day (TID) | INTRAVENOUS | Status: DC
  Filled 2017-07-19: qty 10

## 2017-07-19 MED ORDER — SODIUM CHLORIDE 0.9 % IV BOLUS
2000.0000 mL | Freq: Once | INTRAVENOUS | Status: AC
  Administered 2017-07-19: 2000 mL via INTRAVENOUS

## 2017-07-19 MED ORDER — MIDAZOLAM HCL 2 MG/2ML IJ SOLN: 4.0000 mg | Freq: Once | INTRAMUSCULAR | Status: AC

## 2017-07-19 MED ORDER — INSULIN ASPART 100 UNIT/ML ~~LOC~~ SOLN
0.0000 [IU] | SUBCUTANEOUS | Status: DC
  Administered 2017-07-19 – 2017-07-20 (×5): 3 [IU] via SUBCUTANEOUS
  Administered 2017-07-20: 2 [IU] via SUBCUTANEOUS
  Administered 2017-07-20: 3 [IU] via SUBCUTANEOUS
  Administered 2017-07-21 (×2): 5 [IU] via SUBCUTANEOUS
  Administered 2017-07-21: 2 [IU] via SUBCUTANEOUS
  Administered 2017-07-21: 5 [IU] via SUBCUTANEOUS
  Administered 2017-07-22: 2 [IU] via SUBCUTANEOUS
  Administered 2017-07-22: 3 [IU] via SUBCUTANEOUS
  Administered 2017-07-22: 2 [IU] via SUBCUTANEOUS
  Administered 2017-07-22 – 2017-07-23 (×4): 3 [IU] via SUBCUTANEOUS
  Administered 2017-07-24 (×2): 2 [IU] via SUBCUTANEOUS
  Administered 2017-07-24: 3 [IU] via SUBCUTANEOUS
  Administered 2017-07-24: 1 [IU] via SUBCUTANEOUS
  Administered 2017-07-25: 2 [IU] via SUBCUTANEOUS
  Administered 2017-07-25: 3 [IU] via SUBCUTANEOUS

## 2017-07-19 MED ORDER — ASPIRIN 81 MG PO CHEW: 324.0000 mg | CHEWABLE_TABLET | ORAL | Status: AC

## 2017-07-19 MED ORDER — MIDAZOLAM HCL 5 MG/5ML IJ SOLN
INTRAMUSCULAR | Status: AC | PRN
  Administered 2017-07-19: 4 mg via INTRAVENOUS

## 2017-07-19 MED ORDER — VANCOMYCIN HCL 10 G IV SOLR
2000.0000 mg | Freq: Once | INTRAVENOUS | Status: AC
  Administered 2017-07-19: 2000 mg via INTRAVENOUS
  Filled 2017-07-19: qty 2000

## 2017-07-19 MED ORDER — IPRATROPIUM-ALBUTEROL 0.5-2.5 (3) MG/3ML IN SOLN
3.0000 mL | Freq: Four times a day (QID) | RESPIRATORY_TRACT | Status: DC
Start: 2017-07-19 — End: 2017-07-23
  Administered 2017-07-19 – 2017-07-23 (×16): 3 mL via RESPIRATORY_TRACT
  Filled 2017-07-19 (×16): qty 3

## 2017-07-19 MED ORDER — NOREPINEPHRINE 16 MG/250ML-% IV SOLN
0.0000 ug/min | INTRAVENOUS | Status: DC
Start: 2017-07-19 — End: 2017-07-21
  Administered 2017-07-19: 10 ug/min via INTRAVENOUS
  Filled 2017-07-19: qty 250

## 2017-07-19 MED ORDER — ORAL CARE MOUTH RINSE
15.0000 mL | Freq: Four times a day (QID) | OROMUCOSAL | Status: DC
  Administered 2017-07-19 – 2017-07-22 (×9): 15 mL via OROMUCOSAL

## 2017-07-19 MED ORDER — FENTANYL 2500MCG IN NS 250ML (10MCG/ML) PREMIX INFUSION
0.0000 ug/h | INTRAVENOUS | Status: DC
  Administered 2017-07-19: 50 ug/h via INTRAVENOUS
  Administered 2017-07-20 (×2): 200 ug/h via INTRAVENOUS
  Administered 2017-07-21: 250 ug/h via INTRAVENOUS
  Administered 2017-07-21: 125 ug/h via INTRAVENOUS
  Administered 2017-07-22: 300 ug/h via INTRAVENOUS
  Filled 2017-07-19 (×6): qty 250

## 2017-07-19 NOTE — H&P (Signed)
PULMONARY / CRITICAL CARE MEDICINE   Name: [name] MRN: [id number] DOB: 1965-12-03    ADMISSION DATE:  07/19/2017   CHIEF COMPLAINT: Extreme dyspnea.  HISTORY OF PRESENT ILLNESS:        This is a 52 year old who was admitted to this hospital from 4/19 two 4/21 For dyspnea.  She was treated empirically for CAP with a combination of azithromycin and Rocephin which was switched to [facility] on discharge.  Apparently her dyspnea returned to baseline until early this morning.  The patient also has a known history of congestive heart failure and her last echocardiogram on 06/01/2017 showed an ejection fraction between 25 and 30%.  This morning apparently she became acutely dyspneic.  Her sister is not aware of any known history of fevers chills sweats or cough in the previous 24 hours.  It is the patient's birthday and she is not aware of the patient having done anything peculiar for that event.  There is no known history of chest pain.  She presented to the emergency room in respiratory extremis with a cough productive of very thin rust colored or bloody sputum.  She was so dyspneic that she requested to be intubated.  The patient is intubated and sedated at the time of my exam.  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  PAST SURGICAL HISTORY: She  has a past surgical history that includes Tubal ligation (1996); Dilation and curettage of uterus; Cervix lesion destruction; and left heart catheterization with coronary angiogram (N/A, 05/21/2011).  Allergies  Allergen Reactions  . Lisinopril Hives and Swelling    Facial   . Sertraline Hives and Swelling    Facial   . Tramadol Hives and Swelling    facial  . Ibuprofen Hives and Swelling    No current facility-administered medications on file  prior to encounter.    Current Outpatient Medications on File Prior to Encounter  Medication Sig  . albuterol (PROVENTIL HFA;VENTOLIN HFA) 108 (90 Base) MCG/ACT inhaler Inhale 2 puffs into the lungs every 6 (six) hours as needed for wheezing or shortness of breath.  Marland Kitchen aspirin 81 MG chewable tablet Chew 1 tablet (81 mg total) by mouth daily.  Marland Kitchen atorvastatin (LIPITOR) 80 MG tablet Take 0.5 tablets (40 mg total) by mouth daily at 6 PM.  . azithromycin (ZITHROMAX) 500 MG tablet Take daily  . blood glucose meter kit and supplies Dispense based on patient and insurance preference. Use up to four times daily as directed. (FOR ICD-9 250.00, 250.01).  . Blood Glucose Monitoring Suppl (TRUE METRIX METER) w/Device KIT Use as directed  . carvedilol (COREG) 6.25 MG tablet Take 1 tablet (6.25 mg total) by mouth 2 (two) times daily with a meal.  . furosemide (LASIX) 40 MG tablet Take 1 tablet (40 mg total) by mouth 2 (two) times daily.  Marland Kitchen gabapentin (NEURONTIN) 300 MG capsule Take 1 capsule (300 mg total) by mouth at bedtime.  Marland Kitchen glucose blood (TRUE METRIX BLOOD GLUCOSE TEST) test strip Use as instructed  . guaiFENesin-dextromethorphan (ROBITUSSIN DM) 100-10 MG/5ML syrup Take 5 mLs by mouth every 4 (four) hours as needed for cough.  . hydrALAZINE (APRESOLINE) 10 MG tablet Take 1 tablet (10 mg total) by mouth 3 (three) times daily.  . Insulin Glargine (LANTUS SOLOSTAR) 100 UNIT/ML Solostar Pen Inject 30 Units into the skin  daily at 10 pm. At bedtime  . metFORMIN (GLUCOPHAGE XR) 500 MG 24 hr tablet Take 2 tablets (1,000 mg total) by mouth 2 (two) times daily with a meal.  . TRUEPLUS LANCETS 28G MISC Use as directed  . TRUEPLUS PEN NEEDLES 31G X 6 MM MISC USE AS DIRECTED    FAMILY HISTORY:  Her indicated that her mother is deceased. She indicated that her father is deceased. She indicated that her maternal grandmother is deceased. She indicated that her maternal grandfather is deceased. She indicated that her  paternal grandmother is deceased. She indicated that her paternal grandfather is deceased.   SOCIAL HISTORY: She  reports that she has never smoked. She has never used smokeless tobacco. She reports that she does not drink alcohol or use drugs.  REVIEW OF SYSTEMS:   Not obtainable from the patient.  The patient's sister reports that her sister did suffer from a CVA but that she has no residual other than some difficulties with memory.  She does report that she also has some difficulties with wheezing predictably precipitated by some environmental exposures.  She is otherwise not known to have a history of intrinsic lung disease specifically no known COPD or emphysema.  She does utilize Ventolin at home.  She has no known history of liver disease no known history of GI pathology.  SUBJECTIVE:  Essentially unobtainable  VITAL SIGNS: BP (!) 142/86   Pulse (!) 126   Temp 99.8 F (37.7 C) (Oral)   Resp 19   Ht 5' 9"  (1.753 m)   Wt 222 lb (100.7 kg)   LMP 07/15/2017 (Exact Date)   SpO2 92%   BMI 32.78 kg/m   HEMODYNAMICS:    VENTILATOR SETTINGS: Vent Mode: PRVC FiO2 (%):  [50 %-100 %] 100 % Set Rate:  [8 bmp-16 bmp] 16 bmp Vt Set:  [550 mL] 550 mL PEEP:  [7 cmH20-10 cmH20] 10 cmH20 Plateau Pressure:  [30 cmH20] 30 cmH20  INTAKE / OUTPUT: No intake/output data recorded.  PHYSICAL EXAMINATION: General: Somewhat obese female who is orally intubated sedated and mechanically ventilated.  She was somewhat distressed before her sedation was increased. Neuro: Although not interactive she was vigorously moving all fours prior to increased sedation.  Pupils are equal and the face is symmetric HEENT: Orally intubated. Cardiovascular: There is a median sternotomy scar.  S1 and S2 are regular without murmur rub or gallop. Lungs: There are lots of thin rust colored secretions coming from the endotracheal tube.  Respirations are now unlabored, there is symmetric air movement and lots and lots  of scattered rhonchi with few wheezes.  There is fairly good air movement throughout Abdomen: The abdomen is obese soft and nontender Musculoskeletal: Lower extremities are warm with only trace dependent edema  LABS:  BMET Recent Labs  Lab 07/19/17 1137  NA 138  K 3.6  CL 104  CO2 19*  BUN 12  CREATININE 0.93  GLUCOSE 273*    Electrolytes Recent Labs  Lab 07/19/17 1137  CALCIUM 8.8*    CBC Recent Labs  Lab 07/19/17 1137  WBC 18.9*  HGB 12.1  HCT 38.9  PLT 441*    Coag's No results for input(s): APTT, INR in the last 168 hours.  Sepsis Markers Recent Labs  Lab 07/19/17 1144  LATICACIDVEN 4.29*    ABG No results for input(s): PHART, PCO2ART, PO2ART in the last 168 hours.  Liver Enzymes No results for input(s): AST, ALT, ALKPHOS, BILITOT, ALBUMIN in the last 168 hours.  Cardiac Enzymes No results for input(s): TROPONINI, PROBNP in the last 168 hours.  Glucose No results for input(s): GLUCAP in the last 168 hours.  Imaging Dg Chest Portable 1 View  Result Date: 07/19/2017 CLINICAL DATA:  53 year old female with shortness of breath this morning, pink frothy sputum. Possible congestive heart failure, intubated. EXAM: PORTABLE CHEST 1 VIEW COMPARISON:  1125 hours today and earlier. FINDINGS: Portable AP semi upright view at 1219 hours. Endotracheal tube tip in good position at the level the clavicles. Mildly larger lung volumes. Stable cardiomegaly and mediastinal contours. Confluent and indistinct right greater than left pulmonary opacity with air bronchograms at the right lung base, not significantly changed. No superimposed pneumothorax. No pleural effusion is evident. Paucity bowel gas in the upper abdomen. No acute osseous abnormality identified. IMPRESSION: 1. ET tube in good position at the level the clavicles. 2. Mildly larger lung volumes but otherwise stable chest with right greater than left airspace disease. Top differential considerations are  bilateral pneumonia and asymmetric pulmonary edema. Electronically Signed   By: Genevie Ann M.D.   On: 07/19/2017 12:34   Dg Chest Portable 1 View  Result Date: 07/19/2017 CLINICAL DATA:  Severe shortness of breath. EXAM: PORTABLE CHEST 1 VIEW COMPARISON:  Chest CT and chest x-ray dated 05/31/2017 FINDINGS: There are extensive patchy areas of consolidative infiltrate in the right lung with small patchy areas of infiltrate in the left mid and lower lung zone. Heart size is within normal limits. Pulmonary vascularity appears normal. No discrete effusions. No bone abnormality. IMPRESSION: Bilateral pulmonary infiltrates, much worse on the right than the left, similar to the appearance on the prior CT scan. This probably represents multifocal pneumonia. Electronically Signed   By: Lorriane Shire M.D.   On: 07/19/2017 11:48     STUDIES:  Chest x-ray to my eye shows a well-placed endotracheal tube with the right much greater than left infiltrates with very dense consolidation and air bronchograms in the right lower lobe.  EKG to my eye shows no acute changes.  Initial troponin is 0.  Lactic acid is elevated at 4.3, white count is 18,900  CULTURES: Blood and sputum cultures have been obtained on 6/7  ANTIBIOTICS: Vancomycin and Zosyn initiated 6/7     LINES/TUBES: A central venous catheter will be inserted on 6/7  DISCUSSION:      This is a 52 year old with a known ejection fraction of 25 to 30% who was recently treated for community-acquired pneumonia.  She presents acutely short of breath without any indicators of cardiac ischemia and an asymmetric infiltrate on chest x-ray with a white count of 18,000.  She is required intubation for respiratory extremis.  ASSESSMENT / PLAN:  PULMONARY A: I have started her on a combination of vancomycin and Zosyn for healthcare associated pneumonia.  Appropriate cultures have been ordered.  If she fails to respond in a timely manner despite antibiotics that are  appropriate to the organisms we isolate I will scan her chest in order to ensure that she did not develop an abscess related to her prior episode of pneumonia in April.  Unfortunately she is requiring deep sedation to maintain ventilator synchrony.  Although there may be some component component of congestive heart failure contributing to her dyspnea, I think the first order of business is to keep her generously hydrated and treat the infectious component first  CARDIOVASCULAR A: Known severe systolic dysfunction.  In terms of our sepsis resuscitation I am not going to do a standard  volume loading out of respect for her LV dysfunction and out of a suspicion that part of her lactic acidosis was due to extreme respiratory muscle effort.  I will be adjusting her fluids according to clinical response.  RENAL A: She has a mild metabolic acidosis.  Anticipate that this will improve with treatment of her infection.  GASTROINTESTINAL A: GI prophylaxis will be with Pepcid    HEMATOLOGIC A: DVT prophylaxis will be with Lovenox   INFECTIOUS A: As noted she is being treated for healthcare associated pneumonia with a combination of vancomycin and Zosyn.  Sputum and blood cultures are pending.  Serial troponins and lactates are being followed.  A urine pneumococcal antigen as well as Legionella antigen are pending.  PCR for respiratory pathogens is also pending.    ENDOCRINE A: Initially I will be controlling her diabetes with IV sliding scale insulin anticipating that her control will be somewhat labile in the initial days of hospitalization.  NEUROLOGIC A: No acute issues, she does have a history of CVA  FAMILY  -I discussed the situation with her sister this morning and explained that the acute interventions that we are undertaking are with the understanding that they will be required only has temporary measures.  The sister has reservations about prolonged acute care.     Greater than 35 minutes has  been spent of the care of this patient today who is requiring acute life support interventions.   Lars Masson, MD Critical Care Medicine [facility] Pager: [phone]  07/19/2017, 1:38 PM

## 2017-07-19 NOTE — Progress Notes (Signed)
Pharmacy Antibiotic Note  [name] is a 52 y.o. female admitted on 07/19/2017 with pneumonia.  Pharmacy has been consulted for vancomycin and zosyn dosing. Tmax is 102.9 and WBC is elevated at 18.9. SCr is WNL and lactic acid is elevated.   Plan: Vancomycin 2gm IV x 1 then 1gm IV Q8H Zosyn 3.375gm IV Q8H (4 hr inf) F/u renal fxn, C&S, clinical status and trough at SS  Height: 5\' 9"  (175.3 cm) Weight: 222 lb (100.7 kg) IBW/kg (Calculated) : 66.2  Temp (24hrs), Avg:99.8 F (37.7 C), Min:99.8 F (37.7 C), Max:99.8 F (37.7 C)  No results for input(s): WBC, CREATININE, LATICACIDVEN, VANCOTROUGH, VANCOPEAK, VANCORANDOM, GENTTROUGH, GENTPEAK, GENTRANDOM, TOBRATROUGH, TOBRAPEAK, TOBRARND, AMIKACINPEAK, AMIKACINTROU, AMIKACIN in the last 168 hours.  CrCl cannot be calculated (Patient's most recent lab result is older than the maximum 21 days allowed.).    Allergies  Allergen Reactions  . Lisinopril Hives and Swelling    Facial   . Sertraline Hives and Swelling    Facial   . Tramadol Hives and Swelling    facial  . Ibuprofen Hives and Swelling    Antimicrobials this admission: Vanc 6/7>> Zosyn 6/7>>  Dose adjustments this admission: N/A  Microbiology results: Pending  Thank you for allowing pharmacy to be a part of this patient's care.  Harlee Pursifull, Drake Leach 07/19/2017 11:42 AM

## 2017-07-19 NOTE — Progress Notes (Addendum)
Chaplain stepped in to visit with patient who is now intubated.  Sister has arrived.  I will retrieve her (the sister) per nurse and [place] one.  Chaplain will remain available as needed for this family.  Prayer and ministry of presence provided for this patient.   07/19/17 1247  Clinical Encounter Type  Visited With Patient;Health care provider  Visit Type Follow-up;Spiritual support   Met, Earnest Bailey the sister of this patient.  The patient has been experiencing a lot of negative life experiences such as moving from one place to another, jobless, sort of homeless in that she doesn't have a permanent place to live, recent break up with boyfriend and can no longer live with him.  Sensitive issues.  Thank you for caring for this patient.

## 2017-07-19 NOTE — ED Notes (Signed)
Pt unable to tolerate bi-pap.  Continues to cough up bloody, frothy sputum, filling mask and needing to be suctioned every 2 minutes.   Pt begging to be intubated.  Dr Patria Mane notified.

## 2017-07-19 NOTE — ED Provider Notes (Signed)
[facility] 28[facility] Provider Note   CSN: [id number] Arrival date & time: 07/19/17  1106     History   Chief Complaint Chief Complaint  Patient presents with  . Shortness of Breath   Level 5 caveat: Respiratory distress   HPI Johnni Wunschel is a 52 y.o. female.  HPI 52 year old female who is brought to the emergency department by EMS after developing increasing shortness of breath this morning.  She was in the hospital last month and diagnosed with multifocal pneumonia and treated with Rocephin and azithromycin in the hospital and discharged home on a course of cefdinir.  She states she was feeling better and then over the past 24 hours has began to feel worse again with significant worsening of her shortness of breath this morning.  EMS was called.  On arrival to emergency department she is tachypneic and able to speak in short sentences.  She is tachycardia to the 140s.  She continues to spit up frothy pink material.  She is willing to try BiPAP initially.  She does have a history of congestive heart failure.  She reports orthopnea and increasing shortness of breath at this time.  No fevers at home.  No unilateral leg swelling.   Past Medical History:  Diagnosis Date  . Asthma 05/31/2017  . Diabetes mellitus, type 2 (HCC)    A1C 7.6 April '13  . Hypertension   . Left leg pain 07/21/2015  . Nonischemic cardiomyopathy ([place])    Echo 05/19/11 EF 35% w/ mild-mod MR; R/L Heart Cath 05/21/11 - mildly elevated right heart pressures, normal left heart pressures, preserved cardiac output, widely patent coronary arteries without significant CAD and moderate global systolic LV dysfunction, EF 35-40%.  . 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 Systolic CHF ([place])    Echo 05/19/11 EF 35% w/ mild-mod MR    Patient Active Problem List   Diagnosis Date Noted  . Respiratory failure ([place]) 07/19/2017  . HCAP (healthcare-associated pneumonia)   .  CAP (community acquired pneumonia) 05/31/2017  . Hemoptysis 05/31/2017  . Asthma 05/31/2017  . Hyperlipidemia 02/21/2017  . Diabetic polyneuropathy associated with type 2 diabetes mellitus ([place]) 02/21/2017  . Lung nodules 07/16/2016  . Uncontrolled type 2 diabetes mellitus without complication, with long-term current use of insulin ([place]) 07/16/2016  . DCM (dilated cardiomyopathy) ([place])   . Acute on chronic combined systolic and diastolic CHF (congestive heart failure) ([employer]) 06/10/2016  . Essential hypertension 06/10/2016  . Dyslipidemia associated with type 2 diabetes mellitus ([place]) 06/10/2016    Past Surgical History:  Procedure Laterality Date  . CERVIX LESION DESTRUCTION    . DILATION AND CURETTAGE OF UTERUS    . LEFT HEART CATHETERIZATION WITH CORONARY ANGIOGRAM N/A 05/21/2011   Procedure: LEFT HEART CATHETERIZATION WITH CORONARY ANGIOGRAM;  Surgeon: Sherren Mocha, MD;  Location: [facility] C[facility] LAB;  Service: Cardiovascular;  Laterality: N/A;  . TUBAL LIGATION  1996     OB History   None      Home Medications    Prior to Admission medications   Medication Sig Start Date End Date Taking? Authorizing Provider  albuterol (PROVENTIL HFA;VENTOLIN HFA) 108 (90 Base) MCG/ACT inhaler Inhale 2 puffs into the lungs every 6 (six) hours as needed for wheezing or shortness of breath. 06/02/17   Shelly Coss, MD  aspirin 81 MG chewable tablet Chew 1 tablet (81 mg total) by mouth daily. 06/12/16   Dessa Phi, DO  atorvastatin (LIPITOR)  80 MG tablet Take 0.5 tablets (40 mg total) by mouth daily at 6 PM. 02/21/17   Ladell Pier, MD  azithromycin (ZITHROMAX) 500 MG tablet Take daily 06/02/17   Shelly Coss, MD  blood glucose meter kit and supplies Dispense based on patient and insurance preference. Use up to four times daily as directed. (FOR ICD-9 250.00, 250.01). 06/12/16   Dessa Phi, DO  Blood Glucose Monitoring Suppl (TRUE METRIX METER) w/Device KIT Use as directed 07/03/16   Tresa Garter, MD  carvedilol (COREG) 6.25 MG tablet Take 1 tablet (6.25 mg total) by mouth 2 (two) times daily with a meal. 07/26/16   Ladell Pier, MD  furosemide (LASIX) 40 MG tablet Take 1 tablet (40 mg total) by mouth 2 (two) times daily. 07/26/16   Ladell Pier, MD  gabapentin (NEURONTIN) 300 MG capsule Take 1 capsule (300 mg total) by mouth at bedtime. 02/21/17   Ladell Pier, MD  glucose blood (TRUE METRIX BLOOD GLUCOSE TEST) test strip Use as instructed 07/03/16   Tresa Garter, MD  guaiFENesin-dextromethorphan (ROBITUSSIN DM) 100-10 MG/5ML syrup Take 5 mLs by mouth every 4 (four) hours as needed for cough. 06/02/17   Shelly Coss, MD  hydrALAZINE (APRESOLINE) 10 MG tablet Take 1 tablet (10 mg total) by mouth 3 (three) times daily. 02/21/17   Ladell Pier, MD  Insulin Glargine (LANTUS SOLOSTAR) 100 UNIT/ML Solostar Pen Inject 30 Units into the skin daily at 10 pm. At bedtime 06/02/17   Shelly Coss, MD  metFORMIN (GLUCOPHAGE XR) 500 MG 24 hr tablet Take 2 tablets (1,000 mg total) by mouth 2 (two) times daily with a meal. 02/14/17   Ladell Pier, MD  TRUEPLUS LANCETS 28G MISC Use as directed 07/03/16   Tresa Garter, MD  TRUEPLUS PEN NEEDLES 31G X 6 MM MISC USE AS DIRECTED 05/07/17   Ladell Pier, MD    Family History Family History  Problem Relation Age of Onset  . Diabetes Unknown        multiple  . Heart failure Paternal Grandmother   . Breast cancer Paternal Grandmother   . Heart disease Unknown        multiple  . Breast cancer Mother     Social History Social History   Tobacco Use  . Smoking status: Never Smoker  . Smokeless tobacco: Never Used  Substance Use Topics  . Alcohol use: No  . Drug use: No     Allergies   Lisinopril; Sertraline; Tramadol; and Ibuprofen   Review of Systems Review of Systems  Unable to perform ROS: Severe respiratory distress     Physical Exam Updated Vital Signs BP 90/63   Pulse 76    Temp (!) 101.7 F (38.7 C)   Resp 20   Ht 5' 9"  (1.753 m)   Wt 100.7 kg (222 lb)   LMP 07/15/2017 (Exact Date)   SpO2 100%   BMI 32.78 kg/m   Physical Exam  Constitutional: She is oriented to person, place, and time. She appears well-developed and well-nourished. She appears distressed.  Anxious appearing  HENT:  Head: Normocephalic and atraumatic.  Eyes: EOM are normal.  Neck: Normal range of motion.  Cardiovascular: Regular rhythm and normal heart sounds.  Tachycardic  Pulmonary/Chest:  Tachypnea.  Rhonchi and rales bilaterally.  Mild accessory muscle use  Abdominal: Soft. She exhibits no distension. There is no tenderness.  Musculoskeletal: She exhibits no edema.  Neurological: She is alert and oriented to  person, place, and time.  Skin: Skin is warm and dry.  Psychiatric:  Anxious  Nursing note and vitals reviewed.    ED Treatments / Results  Labs (all labs ordered are listed, but only abnormal results are displayed) Labs Reviewed  BASIC METABOLIC PANEL - Abnormal; Notable for the following components:      Result Value   CO2 19 (*)    Glucose, Bld 273 (*)    Calcium 8.8 (*)    All other components within normal limits  CBC - Abnormal; Notable for the following components:   WBC 18.9 (*)    Platelets 441 (*)    All other components within normal limits  BRAIN NATRIURETIC PEPTIDE - Abnormal; Notable for the following components:   B Natriuretic Peptide 582.5 (*)    All other components within normal limits  LACTIC ACID, PLASMA - Abnormal; Notable for the following components:   Lactic Acid, Venous 3.4 (*)    All other components within normal limits  GLUCOSE, CAPILLARY - Abnormal; Notable for the following components:   Glucose-Capillary 203 (*)    All other components within normal limits  I-STAT CG4 LACTIC ACID, ED - Abnormal; Notable for the following components:   Lactic Acid, Venous 4.29 (*)    All other components within normal limits  I-STAT CG4  LACTIC ACID, ED - Abnormal; Notable for the following components:   Lactic Acid, Venous 3.06 (*)    All other components within normal limits  I-STAT ARTERIAL BLOOD GAS, ED - Abnormal; Notable for the following components:   pO2, Arterial 135.0 (*)    Acid-base deficit 4.0 (*)    All other components within normal limits  CULTURE, BLOOD (ROUTINE X 2)  CULTURE, BLOOD (ROUTINE X 2)  CULTURE, RESPIRATORY (NON-EXPECTORATED)  RESPIRATORY PANEL BY PCR  MRSA PCR SCREENING  TRIGLYCERIDES  PROCALCITONIN  LACTIC ACID, PLASMA  STREP PNEUMONIAE URINARY ANTIGEN  LEGIONELLA PNEUMOPHILA SEROGP 1 UR AG  HEMOGLOBIN A1C  I-STAT TROPONIN, ED  I-STAT BETA HCG BLOOD, ED ([facility], [facility], AP ONLY)    EKG None  Radiology Dg Chest Portable 1 View  Result Date: 07/19/2017 CLINICAL DATA:  Status post central line placement EXAM: PORTABLE CHEST 1 VIEW COMPARISON:  July 19, 2017 12:19 p.m. FINDINGS: The heart size and mediastinal contours are stable. Right central venous line is identified with distal tip probably in the superior vena cava. Endotracheal tube is seen in good position with distal tip 5 cm from carina. Nasogastric tube is identified with distal tip not included on film but is at least in the stomach. Diffuse consolidation of the right lung and patchy consolidation of the left upper and mid lung are worse compared prior exam. The visualized skeletal structures are stable. IMPRESSION: Right central venous line distal tip is in the superior vena cava. No definite pneumothorax is identified. Pulmonary edema versus bilateral pneumonia are worse compared to prior earlier exam. Electronically Signed   By: Abelardo Diesel M.D.   On: 07/19/2017 14:09   Dg Chest Portable 1 View  Result Date: 07/19/2017 CLINICAL DATA:  52 year old female with shortness of breath this morning, pink frothy sputum. Possible congestive heart failure, intubated. EXAM: PORTABLE CHEST 1 VIEW COMPARISON:  1125 hours today and earlier. FINDINGS:  Portable AP semi upright view at 1219 hours. Endotracheal tube tip in good position at the level the clavicles. Mildly larger lung volumes. Stable cardiomegaly and mediastinal contours. Confluent and indistinct right greater than left pulmonary opacity with air bronchograms at the  right lung base, not significantly changed. No superimposed pneumothorax. No pleural effusion is evident. Paucity bowel gas in the upper abdomen. No acute osseous abnormality identified. IMPRESSION: 1. ET tube in good position at the level the clavicles. 2. Mildly larger lung volumes but otherwise stable chest with right greater than left airspace disease. Top differential considerations are bilateral pneumonia and asymmetric pulmonary edema. Electronically Signed   By: Genevie Ann M.D.   On: 07/19/2017 12:34   Dg Chest Portable 1 View  Result Date: 07/19/2017 CLINICAL DATA:  Severe shortness of breath. EXAM: PORTABLE CHEST 1 VIEW COMPARISON:  Chest CT and chest x-ray dated 05/31/2017 FINDINGS: There are extensive patchy areas of consolidative infiltrate in the right lung with small patchy areas of infiltrate in the left mid and lower lung zone. Heart size is within normal limits. Pulmonary vascularity appears normal. No discrete effusions. No bone abnormality. IMPRESSION: Bilateral pulmonary infiltrates, much worse on the right than the left, similar to the appearance on the prior CT scan. This probably represents multifocal pneumonia. Electronically Signed   By: Lorriane Shire M.D.   On: 07/19/2017 11:48    Procedures .Critical Care Performed by: Jola Schmidt, MD Authorized by: Jola Schmidt, MD     CRITICAL CARE Performed by: Jola Schmidt Total critical care time: 40 minutes Critical care time was exclusive of separately billable procedures and treating other patients. Critical care was necessary to treat or prevent imminent or life-threatening deterioration. Critical care was time spent personally by me on the following  activities: development of treatment plan with patient and/or surrogate as well as nursing, discussions with consultants, evaluation of patient's response to treatment, examination of patient, obtaining history from patient or surrogate, ordering and performing treatments and interventions, ordering and review of laboratory studies, ordering and review of radiographic studies, pulse oximetry and re-evaluation of patient's condition.  INTUBATION Performed by: Jola Schmidt Required items: required blood products, implants, devices, and special equipment available Patient identity confirmed: provided demographic data and hospital-assigned identification number Time out: Immediately prior to procedure a "time out" was called to verify the correct patient, procedure, equipment, support staff and site/side marked as required. Indications: Acute respiratory failure Intubation method: Glidescope Laryngoscopy  Preoxygenation: BVM Sedatives: Etomidate Paralytic: Succinylcholine Tube Size: 7.5 cuffed Post-procedure assessment: chest rise and ETCO2 monitor Breath sounds: equal and absent over the epigastrium Tube secured with: ETT holder Chest x-ray interpreted by radiologist and me. Chest x-ray findings: endotracheal tube in appropriate position Patient tolerated the procedure well with no immediate complications.      Medications Ordered in ED Medications  fentaNYL (SUBLIMAZE) 100 MCG/2ML injection (has no administration in time range)  propofol (DIPRIVAN) 1000 MG/100ML infusion (40 mcg/kg/min  100.7 kg Intravenous New Bag/Given 07/19/17 1600)  fentaNYL (SUBLIMAZE) 100 MCG/2ML injection (has no administration in time range)  midazolam (VERSED) injection 4 mg (has no administration in time range)  midazolam (VERSED) 2 MG/2ML injection (has no administration in time range)  fentaNYL 2534mg in NS 2556m(1029mml) infusion-PREMIX (50 mcg/hr Intravenous New Bag/Given 07/19/17 1438)  fentaNYL  (SUBLIMAZE) 100 MCG/2ML injection (has no administration in time range)  ipratropium-albuterol (DUONEB) 0.5-2.5 (3) MG/3ML nebulizer solution 3 mL (3 mLs Nebulization Given 07/19/17 1620)  0.9 %  sodium chloride infusion (has no administration in time range)  aspirin chewable tablet 324 mg (has no administration in time range)    Or  aspirin suppository 300 mg (has no administration in time range)  enoxaparin (LOVENOX) injection 40 mg (has no administration  in time range)  famotidine (PEPCID) IVPB 20 mg premix (has no administration in time range)  ondansetron (ZOFRAN) injection 4 mg (has no administration in time range)  acetaminophen (TYLENOL) tablet 325 mg (has no administration in time range)  insulin regular bolus via infusion 0-10 Units (0 Units Intravenous Hold 07/19/17 1610)  dextrose 50 % solution 25 mL (has no administration in time range)  norepinephrine (LEVOPHED) 75m in D5W 2529mpremix infusion (has no administration in time range)  piperacillin-tazobactam (ZOSYN) IVPB 3.375 g (has no administration in time range)  vancomycin (VANCOCIN) IVPB 1000 mg/200 mL premix (has no administration in time range)  lactated ringers infusion ( Intravenous New Bag/Given 07/19/17 1608)  insulin glargine (LANTUS) injection 12 Units (has no administration in time range)  insulin aspart (novoLOG) injection 0-15 Units (has no administration in time range)  piperacillin-tazobactam (ZOSYN) IVPB 3.375 g (0 g Intravenous Stopped 07/19/17 1245)  sodium chloride 0.9 % bolus 1,000 mL (1,000 mLs Intravenous New Bag/Given 07/19/17 1140)  vancomycin (VANCOCIN) 2,000 mg in sodium chloride 0.9 % 500 mL IVPB (2,000 mg Intravenous New Bag/Given 07/19/17 1301)  etomidate (AMIDATE) injection (10 mg Intravenous Given 07/19/17 1215)  succinylcholine (ANECTINE) injection (100 mg Intravenous Given 07/19/17 1213)  fentaNYL (SUBLIMAZE) injection (100 mcg Intravenous Given 07/19/17 1245)  sodium chloride 0.9 % bolus 2,000 mL (2,000 mLs  Intravenous New Bag/Given 07/19/17 1300)  midazolam (VERSED) 5 MG/5ML injection (4 mg Intravenous Given 07/19/17 1315)  fentaNYL (SUBLIMAZE) injection (100 mcg Intravenous Given 07/19/17 1333)     Initial Impression / Assessment and Plan / ED Course  I have reviewed the triage vital signs and the nursing notes.  Pertinent labs & imaging results that were available during my care of the patient were reviewed by me and considered in my medical decision making (see chart for details).     Patient placed on broad-spectrum antibiotics given what appears to be recurrent multifocal and worsening pneumonia with acute respiratory failure and likely a degree of pulmonary edema.  Attempted patient on BiPAP however she had increased frothy secretions and was unable to tolerate BiPAP secondary to secretions.  Decision was made to intubate the patient for airway protection and worsening respiratory failure.  Patient tolerated the procedure well.  Copious pulmonary edema noted on direct laryngoscopy.  Discussed case with critical care who will evaluate the patient at bedside.  Much of my time was spent at the bedside performing dosing changes for her sedation.  Family updated.  Chaplain involved.  Patient may benefit from bronchoscopy and/or CT for further evaluation  Final Clinical Impressions(s) / ED Diagnoses   Final diagnoses:  Acute respiratory failure with hypoxia ([place] HCAP (healthcare-associated pneumonia)    ED Discharge Orders    None       CaJola SchmidtMD 07/19/17 [id number]

## 2017-07-19 NOTE — Progress Notes (Signed)
Pt transported to 2M03 at this time on vent, no complications.  Report given at bedside.

## 2017-07-19 NOTE — Progress Notes (Signed)
Accompanied the sister, Jeanice Lim, to the waiting room and showed her the unit where her sister would be before taking the sister to her Aunt's room on 3E.  Patient brought her Aunt here when she was sick and the Celine Ahr was admitted to [facility].

## 2017-07-19 NOTE — ED Notes (Signed)
Set #1 blood cultures drawn

## 2017-07-19 NOTE — Progress Notes (Signed)
   07/19/17 1145  Clinical Encounter Type  Visited With Patient;Health care provider  Visit Type ED  Stress Factors  Patient Stress Factors Health changes   While in the ED patient came in.  Medical staff was evaluating.  Provided bedside support and verified the patient wanted me to call family.  Obtained contact for the sister and called Jeanice Lim to let her know she needed to come to the ED.  Let nurse first know family on the way.  Will follow and support as needed. Chaplain Agustin Cree

## 2017-07-19 NOTE — Procedures (Signed)
Central Venous Catheter Insertion Procedure Note [name] [id number] 1965-06-21  Procedure: Insertion of Central Venous Catheter Indications: Assessment of intravascular volume, Drug and/or fluid administration and Frequent blood sampling  Procedure Details Consent: Risks of procedure as well as the alternatives and risks of each were explained to the (patient/caregiver).  Consent for procedure obtained. Time Out: Verified patient identification, verified procedure, site/side was marked, verified correct patient position, special equipment/implants available, medications/allergies/relevent history reviewed, required imaging and test results available.  Performed Real time [place] used to ID and cannulate vessel  Maximum sterile technique was used including antiseptics, cap, gloves, gown, hand hygiene, mask and sheet. Skin prep: Chlorhexidine; local anesthetic administered A antimicrobial bonded/coated triple lumen catheter was placed in the right internal jugular vein using the Seldinger technique.  Evaluation Blood flow good Complications: No apparent complications Patient did tolerate procedure well. Chest X-ray ordered to verify placement.  CXR: normal.  Shelby Mattocks 07/19/2017, 2:28 PM  Simonne Martinet ACNP-BC [facility] Pulmonary/Critical Care Pager # [phone] OR # [phone] if no answer

## 2017-07-19 NOTE — ED Triage Notes (Signed)
Pt endorses shortness of breath that began this morning while at rest, tachypneic and able to speak in 3 word sentences. Hx of chf and this feels the same. Spitting up pink frothy sputum.

## 2017-07-20 DIAGNOSIS — R6521 Severe sepsis with septic shock: Secondary | ICD-10-CM

## 2017-07-20 DIAGNOSIS — I5023 Acute on chronic systolic (congestive) heart failure: Secondary | ICD-10-CM

## 2017-07-20 LAB — POCT I-STAT 3, ART BLOOD GAS (G3+)
ACID-BASE DEFICIT: 7 mmol/L — AB (ref 0.0–2.0)
Acid-base deficit: 6 mmol/L — ABNORMAL HIGH (ref 0.0–2.0)
Bicarbonate: 19.4 mmol/L — ABNORMAL LOW (ref 20.0–28.0)
Bicarbonate: 20.7 mmol/L (ref 20.0–28.0)
O2 SAT: 96 %
O2 SAT: 98 %
PCO2 ART: 41.7 mmHg (ref 32.0–48.0)
PCO2 ART: 46.9 mmHg (ref 32.0–48.0)
PH ART: 7.276 — AB (ref 7.350–7.450)
PO2 ART: 128 mmHg — AB (ref 83.0–108.0)
Patient temperature: 98.6
Patient temperature: 98.6
TCO2: 21 mmol/L — AB (ref 22–32)
TCO2: 22 mmol/L (ref 22–32)
pH, Arterial: 7.252 — ABNORMAL LOW (ref 7.350–7.450)
pO2, Arterial: 91 mmHg (ref 83.0–108.0)

## 2017-07-20 LAB — CBC
HCT: 32.4 % — ABNORMAL LOW (ref 36.0–46.0)
HEMOGLOBIN: 9.8 g/dL — AB (ref 12.0–15.0)
MCH: 26.7 pg (ref 26.0–34.0)
MCHC: 30.2 g/dL (ref 30.0–36.0)
MCV: 88.3 fL (ref 78.0–100.0)
Platelets: 353 10*3/uL (ref 150–400)
RBC: 3.67 MIL/uL — AB (ref 3.87–5.11)
RDW: 14.5 % (ref 11.5–15.5)
WBC: 18.2 10*3/uL — ABNORMAL HIGH (ref 4.0–10.5)

## 2017-07-20 LAB — RESPIRATORY PANEL BY PCR
Adenovirus: NOT DETECTED
BORDETELLA PERTUSSIS-RVPCR: NOT DETECTED
CHLAMYDOPHILA PNEUMONIAE-RVPPCR: NOT DETECTED
Coronavirus 229E: NOT DETECTED
Coronavirus HKU1: NOT DETECTED
Coronavirus NL63: NOT DETECTED
Coronavirus OC43: NOT DETECTED
INFLUENZA A-RVPPCR: NOT DETECTED
Influenza B: NOT DETECTED
Metapneumovirus: NOT DETECTED
Mycoplasma pneumoniae: NOT DETECTED
PARAINFLUENZA VIRUS 3-RVPPCR: NOT DETECTED
Parainfluenza Virus 1: NOT DETECTED
Parainfluenza Virus 2: NOT DETECTED
Parainfluenza Virus 4: NOT DETECTED
RESPIRATORY SYNCYTIAL VIRUS-RVPPCR: NOT DETECTED
RHINOVIRUS / ENTEROVIRUS - RVPPCR: NOT DETECTED

## 2017-07-20 LAB — GLUCOSE, CAPILLARY
GLUCOSE-CAPILLARY: 139 mg/dL — AB (ref 65–99)
GLUCOSE-CAPILLARY: 150 mg/dL — AB (ref 65–99)
GLUCOSE-CAPILLARY: 155 mg/dL — AB (ref 65–99)
GLUCOSE-CAPILLARY: 168 mg/dL — AB (ref 65–99)
GLUCOSE-CAPILLARY: 182 mg/dL — AB (ref 65–99)
Glucose-Capillary: 197 mg/dL — ABNORMAL HIGH (ref 65–99)

## 2017-07-20 LAB — BASIC METABOLIC PANEL
ANION GAP: 7 (ref 5–15)
BUN: 15 mg/dL (ref 6–20)
CHLORIDE: 109 mmol/L (ref 101–111)
CO2: 22 mmol/L (ref 22–32)
Calcium: 7.5 mg/dL — ABNORMAL LOW (ref 8.9–10.3)
Creatinine, Ser: 1.28 mg/dL — ABNORMAL HIGH (ref 0.44–1.00)
GFR calc non Af Amer: 48 mL/min — ABNORMAL LOW (ref >60)
GFR, EST AFRICAN AMERICAN: 56 mL/min — AB (ref >60)
Glucose, Bld: 163 mg/dL — ABNORMAL HIGH (ref 65–99)
POTASSIUM: 3.7 mmol/L (ref 3.5–5.1)
Sodium: 138 mmol/L (ref 135–145)

## 2017-07-20 LAB — STREP PNEUMONIAE URINARY ANTIGEN: Strep Pneumo Urinary Antigen: NEGATIVE

## 2017-07-20 LAB — MAGNESIUM: Magnesium: 1.3 mg/dL — ABNORMAL LOW (ref 1.7–2.4)

## 2017-07-20 LAB — PROCALCITONIN: Procalcitonin: 30.6 ng/mL

## 2017-07-20 LAB — PHOSPHORUS: PHOSPHORUS: 3.3 mg/dL (ref 2.5–4.6)

## 2017-07-20 MED ORDER — ACETAMINOPHEN 160 MG/5ML PO SOLN
650.0000 mg | Freq: Four times a day (QID) | ORAL | Status: DC | PRN
  Administered 2017-07-20 – 2017-07-25 (×3): 650 mg
  Filled 2017-07-20 (×4): qty 20.3

## 2017-07-20 MED ORDER — HEPARIN SODIUM (PORCINE) 5000 UNIT/ML IJ SOLN
5000.0000 [IU] | Freq: Three times a day (TID) | INTRAMUSCULAR | Status: DC
  Administered 2017-07-20: 5000 [IU] via SUBCUTANEOUS
  Filled 2017-07-20 (×2): qty 1

## 2017-07-20 MED ORDER — ACETAMINOPHEN 325 MG PO TABS: 650.0000 mg | ORAL_TABLET | Freq: Four times a day (QID) | ORAL | Status: DC | PRN

## 2017-07-20 MED ORDER — VANCOMYCIN HCL IN DEXTROSE 750-5 MG/150ML-% IV SOLN
750.0000 mg | Freq: Two times a day (BID) | INTRAVENOUS | Status: DC
  Administered 2017-07-20 – 2017-07-23 (×6): 750 mg via INTRAVENOUS
  Filled 2017-07-20 (×6): qty 150

## 2017-07-20 MED ORDER — HEPARIN SODIUM (PORCINE) 5000 UNIT/ML IJ SOLN
5000.0000 [IU] | Freq: Three times a day (TID) | INTRAMUSCULAR | Status: DC
  Administered 2017-07-20 – 2017-07-25 (×16): 5000 [IU] via SUBCUTANEOUS
  Filled 2017-07-20 (×17): qty 1

## 2017-07-20 NOTE — Progress Notes (Addendum)
Initial Nutrition Assessment  DOCUMENTATION CODES:  Obesity unspecified  INTERVENTION:  If unable to extubate within 24 hours, would recommend nutrition support.   To prevent overfeeding, recommended Vital High Protein at goal rate of 20 ml/h (480 ml per day) and Prostat 60 ml TID to provide 1080 kcals (+218 from propofol), 132 gm protein, 401 ml free water daily.  NUTRITION DIAGNOSIS:  Inadequate oral intake related to inability to eat as evidenced by NPO status.  GOAL:  Provide needs based on ASPEN/SCCM guidelines  MONITOR:  Diet advancement, Vent status, Labs, I & O's  REASON FOR ASSESSMENT:  Ventilator    ASSESSMENT:  52 y/o female PMHx HF, CVA, HTN, DM2. Recently admitted 4/19-4/21 for dyspnea, which she redeveloped acutely yesterday. Was so dyspneic she requested intubation. Admitted for management of resp failure, AoC HF and AKI.   Patient intubated, sedated. No historians present on RD arrival. All information obtained from chart.   Per notes, pt developed her acute illness over a 24 hr period, which would suggest she had been at baseline nutritionally up until 24 hrs as well.   Weights would suggest pt holding large amount of fluid; she was weighed at 216 lbs 14.4 oz on a standing scale that admission. Further chart history shows her weight has fluctuates, but she has been <220 for the past year. Her current bed weight is much higher than her ubw ,now >240 lbs. Will use the above wt for dosing purpose.   NFPE: No discernible muscle/fat wasting. Abdomen is soft, nondistended   Patient is currently intubated on ventilator support MV: 10.4 L/min Temp (24hrs), Avg:100.6 F (38.1 C), Min:97.7 F (36.5 C), Max:102.7 F (39.3 C) Propofol: 9.36ml/hr-218 kcals/d  Labs: LA: 2.7BGs 150-185, Mag:1.3, WBC:18.2, BUN/Creat:15/1.28 (up from yesterday), A1C: 10 Meds: Insulin, IVF, H2RA, IV ABX Sedation/analgesia: Fentanyl Pressor Support: Levo   Recent Labs  Lab 07/19/17 1137  07/20/17 0322  NA 138 138  K 3.6 3.7  CL 104 109  CO2 19* 22  BUN 12 15  CREATININE 0.93 1.28*  CALCIUM 8.8* 7.5*  MG  --  1.3*  PHOS  --  3.3  GLUCOSE 273* 163*    NUTRITION - FOCUSED PHYSICAL EXAM: WDL, no wasting. Abdomen is soft.   Diet Order:   Diet Order           Diet NPO time specified  Diet effective now         EDUCATION NEEDS:  No education needs have been identified at this time  Skin:  Skin Assessment: Reviewed RN Assessment  Last BM:  Unknown  Height:  Ht Readings from Last 1 Encounters:  07/19/17 5\' 9"  (1.753 m)   Weight:  Wt Readings from Last 1 Encounters:  07/20/17 241 lb 13.5 oz (109.7 kg)   Wt Readings from Last 10 Encounters:  07/20/17 241 lb 13.5 oz (109.7 kg)  06/01/17 216 lb 14.4 oz (98.4 kg)  04/09/17 216 lb (98 kg)  02/21/17 211 lb (95.7 kg)  09/25/16 211 lb 9.6 oz (96 kg)  07/16/16 201 lb 3.2 oz (91.3 kg)  06/20/16 198 lb 6.4 oz (90 kg)  06/19/16 196 lb (88.9 kg)  06/12/16 201 lb 14.4 oz (91.6 kg)  08/01/15 219 lb 6.4 oz (99.5 kg)   Ideal Body Weight:  65.91 kg  BMI:  Body mass index is 35.71 kg/m.  Dosing weight:98.4 kg Estimated Nutritional Needs:  Kcal:  1080-1380 kcals (11-14 kcal/kg bw) Protein:  >132g Pro (2g/kg ibw) Fluid:  Per  MD goals  Christophe Louis RD, LDN, CNSC Clinical Nutrition Available Tues-Sat via Pager: [phone] 07/20/2017 1:32 PM

## 2017-07-20 NOTE — Progress Notes (Signed)
Pharmacy Antibiotic Note  [name] is a 52 y.o. female admitted on 07/19/2017 with pneumonia.  Pharmacy has been consulted for vancomycin and zosyn dosing. Patient's SCr has bumped to 1.28 overnight. CrCl down to ~ 60 mL/in   Plan: Decrease vancomycin to 750 mg IV Q 12 hours  Zosyn 3.375gm IV Q8H (4 hr inf) F/u renal fxn, C&S, clinical status and trough at SS  Height: 5\' 9"  (175.3 cm) Weight: 241 lb 13.5 oz (109.7 kg) IBW/kg (Calculated) : 66.2  Temp (24hrs), Avg:101.1 F (38.4 C), Min:97.8 F (36.6 C), Max:102.9 F (39.4 C)  Recent Labs  Lab 07/19/17 1137 07/19/17 1144 07/19/17 1347 07/19/17 1401 07/19/17 1700 07/20/17 0322  WBC 18.9*  --   --   --   --  18.2*  CREATININE 0.93  --   --   --   --  1.28*  LATICACIDVEN  --  4.29* 3.4* 3.06* 2.7*  --     Estimated Creatinine Clearance: 69.4 mL/min (A) (by C-G formula based on SCr of 1.28 mg/dL (H)).    Allergies  Allergen Reactions  . Lisinopril Hives and Swelling    Facial   . Sertraline Hives and Swelling    Facial   . Tramadol Hives and Swelling    facial  . Ibuprofen Hives and Swelling    Antimicrobials this admission: Vanc 6/7>> Zosyn 6/7>>  Dose adjustments this admission: N/A  Microbiology results: 6/7 TA pending 6/7 BCx x 2 pending   Thank you for allowing pharmacy to be a part of this patient's care.  Vinnie Level, PharmD., BCPS Clinical Pharmacist Clinical phone for 07/20/17 until 3:30pm: [phone] If after 3:30pm, please call main pharmacy at: [phone]

## 2017-07-20 NOTE — Progress Notes (Signed)
PULMONARY / CRITICAL CARE MEDICINE   Name: [name] MRN: [id number] DOB: 01/03/1966    ADMISSION DATE:  07/19/2017   CHIEF COMPLAINT: Extreme dyspnea.  HISTORY OF PRESENT ILLNESS:        This is a 52 year old who was admitted to this hospital from 4/19 two 4/21 For dyspnea.  She was treated empirically for CAP with a combination of azithromycin and Rocephin which was switched to [facility] on discharge.  Apparently her dyspnea returned to baseline until early this morning.  The patient also has a known history of congestive heart failure and her last echocardiogram on 06/01/2017 showed an ejection fraction between 25 and 30%.  This morning apparently she became acutely dyspneic.  Her sister is not aware of any known history of fevers chills sweats or cough in the previous 24 hours.  It is the patient's birthday and she is not aware of the patient having done anything peculiar for that event.  There is no known history of chest pain.  She presented to the emergency room in respiratory extremis with a cough productive of very thin rust colored or bloody sputum.  She was so dyspneic that she requested to be intubated.  The patient is intubated and sedated at the time of my exam.  SUBJECTIVE:  FiO2 weaned to 40, PEEP still 10 Sedated on propofol, fentanyl Remains on norepinephrine 10  VITAL SIGNS: BP 133/74 (BP Location: Left Arm)   Pulse 83   Temp 98.9 F (37.2 C) (Axillary)   Resp 16   Ht 5\' 9"  (1.753 m)   Wt 109.7 kg (241 lb 13.5 oz)   LMP 07/15/2017 (Exact Date)   SpO2 100%   BMI 35.71 kg/m   HEMODYNAMICS:    VENTILATOR SETTINGS: Vent Mode: PRVC FiO2 (%):  [40 %-100 %] 40 % Set Rate:  [8 bmp-16 bmp] 16 bmp Vt Set:  [530 mL-550 mL] 530 mL PEEP:  [7 cmH20-10 cmH20] 10 cmH20 Plateau Pressure:  [27 cmH20-30 cmH20] 28 cmH20  INTAKE / OUTPUT: I/O last 3 completed shifts: In: 1541.3 [I.V.:1071.3; NG/GT:70; IV Piggyback:400] Out: 905 [Urine:805; Emesis/NG output:100]  PHYSICAL  EXAMINATION: General: Ill-appearing woman, sedated intubated Neuro: She does not wake with stimulation, she tries to open her eyes and grimaces.  Not following commands HEENT: ET tube in place, no oral lesions Cardiovascular: Old sternotomy scar.  Regular, no murmur Lungs: Bilateral inspiratory crackles more on the right than on the left.  No significant secretions noted currently Abdomen: Obese, soft, nontender, positive bowel sounds Musculoskeletal: Trace edema bilateral lower extremities  LABS:  BMET Recent Labs  Lab 07/19/17 1137 07/20/17 0322  NA 138 138  K 3.6 3.7  CL 104 109  CO2 19* 22  BUN 12 15  CREATININE 0.93 1.28*  GLUCOSE 273* 163*    Electrolytes Recent Labs  Lab 07/19/17 1137 07/20/17 0322  CALCIUM 8.8* 7.5*  MG  --  1.3*  PHOS  --  3.3    CBC Recent Labs  Lab 07/19/17 1137 07/19/17 1700 07/20/17 0322  WBC 18.9*  --  18.2*  HGB 12.1  --  9.8*  HCT 38.9  --  32.4*  PLT 441* 276 353    Coag's Recent Labs  Lab 07/19/17 1700  APTT 25  INR 1.21    Sepsis Markers Recent Labs  Lab 07/19/17 1347 07/19/17 1401 07/19/17 1700 07/20/17 0322  LATICACIDVEN 3.4* 3.06* 2.7*  --   PROCALCITON 2.66  --   --  30.60    ABG  Recent Labs  Lab 07/19/17 1425 07/20/17 0339  PHART 7.354 7.276*  PCO2ART 37.7 41.7  PO2ART 135.0* 91.0    Liver Enzymes No results for input(s): AST, ALT, ALKPHOS, BILITOT, ALBUMIN in the last 168 hours.  Cardiac Enzymes No results for input(s): TROPONINI, PROBNP in the last 168 hours.  Glucose Recent Labs  Lab 07/19/17 1508 07/19/17 1928 07/19/17 2322 07/20/17 0329 07/20/17 0727  GLUCAP 203* 156* 177* 155* 182*    Imaging Dg Chest Portable 1 View  Result Date: 07/19/2017 CLINICAL DATA:  Status post central line placement EXAM: PORTABLE CHEST 1 VIEW COMPARISON:  July 19, 2017 12:19 p.m. FINDINGS: The heart size and mediastinal contours are stable. Right central venous line is identified with distal tip  probably in the superior vena cava. Endotracheal tube is seen in good position with distal tip 5 cm from carina. Nasogastric tube is identified with distal tip not included on film but is at least in the stomach. Diffuse consolidation of the right lung and patchy consolidation of the left upper and mid lung are worse compared prior exam. The visualized skeletal structures are stable. IMPRESSION: Right central venous line distal tip is in the superior vena cava. No definite pneumothorax is identified. Pulmonary edema versus bilateral pneumonia are worse compared to prior earlier exam. Electronically Signed   By: Sherian Rein M.D.   On: 07/19/2017 14:09   Dg Chest Portable 1 View  Result Date: 07/19/2017 CLINICAL DATA:  52 year old female with shortness of breath this morning, pink frothy sputum. Possible congestive heart failure, intubated. EXAM: PORTABLE CHEST 1 VIEW COMPARISON:  1125 hours today and earlier. FINDINGS: Portable AP semi upright view at 1219 hours. Endotracheal tube tip in good position at the level the clavicles. Mildly larger lung volumes. Stable cardiomegaly and mediastinal contours. Confluent and indistinct right greater than left pulmonary opacity with air bronchograms at the right lung base, not significantly changed. No superimposed pneumothorax. No pleural effusion is evident. Paucity bowel gas in the upper abdomen. No acute osseous abnormality identified. IMPRESSION: 1. ET tube in good position at the level the clavicles. 2. Mildly larger lung volumes but otherwise stable chest with right greater than left airspace disease. Top differential considerations are bilateral pneumonia and asymmetric pulmonary edema. Electronically Signed   By: Odessa Fleming M.D.   On: 07/19/2017 12:34   Dg Chest Portable 1 View  Result Date: 07/19/2017 CLINICAL DATA:  Severe shortness of breath. EXAM: PORTABLE CHEST 1 VIEW COMPARISON:  Chest CT and chest x-ray dated 05/31/2017 FINDINGS: There are extensive patchy  areas of consolidative infiltrate in the right lung with small patchy areas of infiltrate in the left mid and lower lung zone. Heart size is within normal limits. Pulmonary vascularity appears normal. No discrete effusions. No bone abnormality. IMPRESSION: Bilateral pulmonary infiltrates, much worse on the right than the left, similar to the appearance on the prior CT scan. This probably represents multifocal pneumonia. Electronically Signed   By: Francene Boyers M.D.   On: 07/19/2017 11:48     STUDIES:  Chest x-ray to my eye shows a well-placed endotracheal tube with the right much greater than left infiltrates with very dense consolidation and air bronchograms in the right lower lobe.  EKG to my eye shows no acute changes.  Initial troponin is 0.  Lactic acid is elevated at 4.3, white count is 18,900  CULTURES: Blood and sputum cultures have been obtained on 6/7  ANTIBIOTICS: Vancomycin 6/7 Zosyn 6/7  LINES/TUBES: A central venous catheter  will be inserted on 6/7  DISCUSSION:      This is a 52 year old with a known ejection fraction of 25 to 30% who was recently treated for community-acquired pneumonia.  She presents acutely short of breath without any indicators of cardiac ischemia and an asymmetric infiltrate on chest x-ray with a white count of 18,000.  She is required intubation for respiratory extremis.  ASSESSMENT / PLAN:  PULMONARY A:  Acute respiratory failure in the setting of acute bilateral infiltrates ARDS physiology Suspected cardiogenic pulmonary edema although infiltrates are asymmetric and must also consider healthcare associated pneumonia P: Continue current ventilator strategy, lung protective low tidal volume Diuresis as she can tolerate, but blood pressure is precluding currently Antibiotics as below Scheduled bronchodilators  CARDIOVASCULAR A:  Acute on chronic systolic CHF Shock, cardiogenic plus probable septic shock Volume resuscitation as ordered.  Not  in a position for diuresis at this time.  Clinical picture is most consistent with a pneumonia and septic shock. Wean norepinephrine as able Follow CVP to guide volume status Consider repeat echocardiogram, last 06/01/2017  RENAL A:  Acute renal failure, likely ATN Acute anion gap metabolic acidosis Volume resuscitation Lactate clearance, now 2.7 Follow urine output, serial BMP Replace electrolytes as indicated  GASTROINTESTINAL A:  Stress ulcer prophylaxis Nutrition  Pepcid as ordered  HEMATOLOGIC A: DVT prophylaxis Heparin subcutaneous   INFECTIOUS A:  Clinical picture, chest x-ray, procalcitonin all consistent with healthcare associated pneumonia Septic shock Broad-spectrum antibiotics as ordered, vancomycin and Zosyn Follow culture data Pneumococcal urine antigen negative, Legionella pending  ENDOCRINE A: Hyperglycemia ICU hyperglycemia protocol  NEUROLOGIC A: Sedation requirements for mechanical ventilation Propofol, fentanyl as ordered  FAMILY  -Dr Juanita Craver discussed the situation with her sister 6/7 and explained that the acute interventions that we are undertaking are with the understanding that they will be required only has temporary measures.  The sister has reservations about prolonged acute care.     Independent CC time 34 minutes  Levy Pupa, MD, PhD 07/20/2017, 9:34 AM [place] [facility] [facility] [phone] or if no answer [phone]

## 2017-07-21 ENCOUNTER — Inpatient Hospital Stay (HOSPITAL_COMMUNITY): Payer: Medicaid Other

## 2017-07-21 LAB — BASIC METABOLIC PANEL
ANION GAP: 5 (ref 5–15)
BUN: 10 mg/dL (ref 6–20)
CO2: 24 mmol/L (ref 22–32)
Calcium: 7.8 mg/dL — ABNORMAL LOW (ref 8.9–10.3)
Chloride: 113 mmol/L — ABNORMAL HIGH (ref 101–111)
Creatinine, Ser: 1.27 mg/dL — ABNORMAL HIGH (ref 0.44–1.00)
GFR, EST AFRICAN AMERICAN: 56 mL/min — AB (ref >60)
GFR, EST NON AFRICAN AMERICAN: 48 mL/min — AB (ref >60)
GLUCOSE: 78 mg/dL (ref 65–99)
POTASSIUM: 3.4 mmol/L — AB (ref 3.5–5.1)
Sodium: 142 mmol/L (ref 135–145)

## 2017-07-21 LAB — GLUCOSE, CAPILLARY
GLUCOSE-CAPILLARY: 76 mg/dL (ref 65–99)
GLUCOSE-CAPILLARY: 229 mg/dL — AB (ref 65–99)
Glucose-Capillary: 155 mg/dL — ABNORMAL HIGH (ref 65–99)
Glucose-Capillary: 206 mg/dL — ABNORMAL HIGH (ref 65–99)
Glucose-Capillary: 209 mg/dL — ABNORMAL HIGH (ref 65–99)
Glucose-Capillary: 227 mg/dL — ABNORMAL HIGH (ref 65–99)
Glucose-Capillary: 74 mg/dL (ref 65–99)

## 2017-07-21 LAB — POCT I-STAT 3, ART BLOOD GAS (G3+)
Acid-base deficit: 4 mmol/L — ABNORMAL HIGH (ref 0.0–2.0)
Bicarbonate: 21.4 mmol/L (ref 20.0–28.0)
O2 SAT: 99 %
PH ART: 7.339 — AB (ref 7.350–7.450)
PO2 ART: 177 mmHg — AB (ref 83.0–108.0)
Patient temperature: 37.6
TCO2: 23 mmol/L (ref 22–32)
pCO2 arterial: 40.1 mmHg (ref 32.0–48.0)

## 2017-07-21 LAB — CBC
HCT: 27.7 % — ABNORMAL LOW (ref 36.0–46.0)
HEMOGLOBIN: 8.3 g/dL — AB (ref 12.0–15.0)
MCH: 26.5 pg (ref 26.0–34.0)
MCHC: 30 g/dL (ref 30.0–36.0)
MCV: 88.5 fL (ref 78.0–100.0)
PLATELETS: 243 10*3/uL (ref 150–400)
RBC: 3.13 MIL/uL — ABNORMAL LOW (ref 3.87–5.11)
RDW: 14.6 % (ref 11.5–15.5)
WBC: 6.1 10*3/uL (ref 4.0–10.5)

## 2017-07-21 LAB — MAGNESIUM: Magnesium: 1.6 mg/dL — ABNORMAL LOW (ref 1.7–2.4)

## 2017-07-21 LAB — PROCALCITONIN: PROCALCITONIN: 21.92 ng/mL

## 2017-07-21 MED ORDER — MAGNESIUM SULFATE 2 GM/50ML IV SOLN
2.0000 g | Freq: Once | INTRAVENOUS | Status: AC
  Administered 2017-07-21: 2 g via INTRAVENOUS
  Filled 2017-07-21: qty 50

## 2017-07-21 MED ORDER — MIDAZOLAM HCL 2 MG/2ML IJ SOLN
2.0000 mg | INTRAMUSCULAR | Status: DC | PRN
  Administered 2017-07-21 – 2017-07-22 (×4): 2 mg via INTRAVENOUS
  Filled 2017-07-21 (×4): qty 2

## 2017-07-21 MED ORDER — FENTANYL BOLUS VIA INFUSION
50.0000 ug | INTRAVENOUS | Status: DC | PRN
  Administered 2017-07-21 – 2017-07-22 (×3): 50 ug via INTRAVENOUS
  Filled 2017-07-21: qty 50

## 2017-07-21 MED ORDER — POTASSIUM CHLORIDE 20 MEQ/15ML (10%) PO SOLN
40.0000 meq | ORAL | Status: AC
  Administered 2017-07-21 (×3): 40 meq
  Filled 2017-07-21 (×3): qty 30

## 2017-07-21 MED ORDER — FUROSEMIDE 10 MG/ML IJ SOLN
40.0000 mg | Freq: Two times a day (BID) | INTRAMUSCULAR | Status: AC
  Administered 2017-07-21 – 2017-07-22 (×3): 40 mg via INTRAVENOUS
  Filled 2017-07-21 (×3): qty 4

## 2017-07-21 NOTE — Progress Notes (Signed)
eLink Physician-Brief Progress Note Patient Name: [name] DOB: 05/29/1965 MRN: [id number]   Date of Service  07/21/2017  HPI/Events of Note  Hypokalemia and hypomag  eICU Interventions  Potassium and mag replaced     Intervention Category Intermediate Interventions: Electrolyte abnormality - evaluation and management  [name] 07/21/2017, 6:50 AM

## 2017-07-21 NOTE — Progress Notes (Signed)
PULMONARY / CRITICAL CARE MEDICINE   Name: Elliannah Wayment MRN: [id number] DOB: Sep 22, 1965    ADMISSION DATE:  07/19/2017   CHIEF COMPLAINT: Extreme dyspnea.  HISTORY OF PRESENT ILLNESS:        This is a 52 year old who was admitted to this hospital from 4/19 two 4/21 For dyspnea.  She was treated empirically for CAP with a combination of azithromycin and Rocephin which was switched to [facility] on discharge.  Apparently her dyspnea returned to baseline until early this morning.  The patient also has a known history of congestive heart failure and her last echocardiogram on 06/01/2017 showed an ejection fraction between 25 and 30%.  This morning apparently she became acutely dyspneic.  Her sister is not aware of any known history of fevers chills sweats or cough in the previous 24 hours.  It is the patient's birthday and she is not aware of the patient having done anything peculiar for that event.  There is no known history of chest pain.  She presented to the emergency room in respiratory extremis with a cough productive of very thin rust colored or bloody sputum.  She was so dyspneic that she requested to be intubated.  The patient is intubated and sedated at the time of my exam.  SUBJECTIVE:  Norepinephrine weaned to off Sedation decreased, more awake  VITAL SIGNS: BP 115/64   Pulse 88   Temp 99.7 F (37.6 C) (Bladder)   Resp (!) 26   Ht 5\' 9"  (1.753 m)   Wt 110.1 kg (242 lb 11.6 oz)   LMP 07/15/2017 (Exact Date)   SpO2 100%   BMI 35.84 kg/m   HEMODYNAMICS: CVP:  [12 mmHg] 12 mmHg  VENTILATOR SETTINGS: Vent Mode: PRVC FiO2 (%):  [40 %] 40 % Set Rate:  [22 bmp-26 bmp] 26 bmp Vt Set:  [400 mL] 400 mL PEEP:  [5 cmH20-10 cmH20] 5 cmH20 Plateau Pressure:  [20 cmH20-25 cmH20] 20 cmH20  INTAKE / OUTPUT: I/O last 3 completed shifts: In: 7263 [I.V.:6068; Other:20; NG/GT:100; IV Piggyback:1075] Out: 3495 [Urine:3495]  PHYSICAL EXAMINATION: General: Ill-appearing woman, intubated  and ventilated Neuro: Awake, nods to questions, follows commands  HEENT: ET tube in place, no oral lesions, right eye is injected Cardiovascular: Regular, no murmur Lungs: Bilateral inspiratory crackles right greater than left, no evidence of bleeding at this time Abdomen: Obese, soft, nontender, positive bowel sounds Musculoskeletal: Trace pedal edema  LABS:  BMET Recent Labs  Lab 07/19/17 1137 07/20/17 0322 07/21/17 0337  NA 138 138 142  K 3.6 3.7 3.4*  CL 104 109 113*  CO2 19* 22 24  BUN 12 15 10   CREATININE 0.93 1.28* 1.27*  GLUCOSE 273* 163* 78    Electrolytes Recent Labs  Lab 07/19/17 1137 07/20/17 0322 07/21/17 0337  CALCIUM 8.8* 7.5* 7.8*  MG  --  1.3* 1.6*  PHOS  --  3.3  --     CBC Recent Labs  Lab 07/19/17 1137 07/19/17 1700 07/20/17 0322 07/21/17 0337  WBC 18.9*  --  18.2* 6.1  HGB 12.1  --  9.8* 8.3*  HCT 38.9  --  32.4* 27.7*  PLT 441* 276 353 243    Coag's Recent Labs  Lab 07/19/17 1700  APTT 25  INR 1.21    Sepsis Markers Recent Labs  Lab 07/19/17 1347 07/19/17 1401 07/19/17 1700 07/20/17 0322 07/21/17 0337  LATICACIDVEN 3.4* 3.06* 2.7*  --   --   PROCALCITON 2.66  --   --  30.60 21.92  ABG Recent Labs  Lab 07/20/17 0339 07/20/17 1119 07/21/17 0311  PHART 7.276* 7.252* 7.339*  PCO2ART 41.7 46.9 40.1  PO2ART 91.0 128.0* 177.0*    Liver Enzymes No results for input(s): AST, ALT, ALKPHOS, BILITOT, ALBUMIN in the last 168 hours.  Cardiac Enzymes No results for input(s): TROPONINI, PROBNP in the last 168 hours.  Glucose Recent Labs  Lab 07/20/17 1157 07/20/17 1612 07/20/17 1933 07/20/17 2344 07/21/17 0334 07/21/17 0717  GLUCAP 168* 150* 197* 139* 76 74    Imaging No results found.   STUDIES:  Chest x-ray to my eye shows a well-placed endotracheal tube with the right much greater than left infiltrates with very dense consolidation and air bronchograms in the right lower lobe.  EKG to my eye shows no  acute changes.  Initial troponin is 0.  Lactic acid is elevated at 4.3, white count is 18,900  CULTURES: Blood and sputum cultures have been obtained on 6/7  ANTIBIOTICS: Vancomycin 6/7 Zosyn 6/7  LINES/TUBES: A central venous catheter will be inserted on 6/7  DISCUSSION:      This is a 52 year old with a known ejection fraction of 25 to 30% who was recently treated for community-acquired pneumonia.  She presents acutely short of breath without any indicators of cardiac ischemia and an asymmetric infiltrate on chest x-ray.  Intubated, treated for possible cardiogenic edema, pneumonia.  Consider acute lung injury  ASSESSMENT / PLAN:  PULMONARY A:  Acute respiratory failure in the setting of acute bilateral infiltrates ARDS physiology Suspected cardiogenic pulmonary edema although infiltrates are asymmetric and must also consider healthcare associated pneumonia > significantly improved on 6/9 chest x-ray, more consistent with pulmonary edema P: Low tidal volume ventilator strategy Should be able to initiate diuresis now that hemodynamics are improved Antibiotics as below Scheduled bronchodilators  CARDIOVASCULAR A:  Acute on chronic systolic CHF Shock, cardiogenic plus probable septic shock Stop LR maintenance fluids now Diuresis as she can tolerate Norepinephrine off Follow CVP to guide volume status Consider repeat echocardiogram, last 06/01/2017.  RENAL A:  Acute renal failure, likely ATN Acute anion gap metabolic acidosis, improved Decreased volume resuscitation on 6/9 Follow urine output, serial BMP Initiate diuresis 6/9 Replace electrolytes as indicated, potassium and magnesium given  GASTROINTESTINAL A:  Stress ulcer prophylaxis Nutrition  Pepcid as ordered  HEMATOLOGIC A: DVT prophylaxis Subcutaneous heparin   INFECTIOUS A:  Clinical picture, chest x-ray, procalcitonin all consistent with healthcare associated pneumonia -Pneumococcal antigen negative,  Legionella pending Septic shock Continue broad-spectrum antibiotics for now, plan to narrow based on culture data 6/10 Follow culture data, currently negative   ENDOCRINE A: Hyperglycemia ICU hyperglycemia protocol  NEUROLOGIC A: Sedation requirements for mechanical ventilation Fentanyl as ordered, propofol weaned to off >> d/c and change to versed pushes  FAMILY  -Dr Juanita Craver discussed the situation with her sister 6/7 and explained that the acute interventions that we are undertaking are with the understanding that they will be required only has temporary measures.  The sister has reservations about prolonged acute care.     Independent CC time 32 minutes  Levy Pupa, MD, PhD 07/21/2017, 8:29 AM [place] [facility] [phone] or if no answer [phone]

## 2017-07-22 ENCOUNTER — Inpatient Hospital Stay (HOSPITAL_COMMUNITY): Payer: Medicaid Other

## 2017-07-22 DIAGNOSIS — G9340 Encephalopathy, unspecified: Secondary | ICD-10-CM

## 2017-07-22 DIAGNOSIS — J810 Acute pulmonary edema: Secondary | ICD-10-CM

## 2017-07-22 LAB — GLUCOSE, CAPILLARY
GLUCOSE-CAPILLARY: 119 mg/dL — AB (ref 65–99)
GLUCOSE-CAPILLARY: 157 mg/dL — AB (ref 65–99)
GLUCOSE-CAPILLARY: 148 mg/dL — AB (ref 65–99)
Glucose-Capillary: 112 mg/dL — ABNORMAL HIGH (ref 65–99)
Glucose-Capillary: 177 mg/dL — ABNORMAL HIGH (ref 65–99)
Glucose-Capillary: 142 mg/dL — ABNORMAL HIGH (ref 65–99)

## 2017-07-22 LAB — BASIC METABOLIC PANEL
ANION GAP: 9 (ref 5–15)
BUN: 10 mg/dL (ref 6–20)
CO2: 25 mmol/L (ref 22–32)
Calcium: 8.6 mg/dL — ABNORMAL LOW (ref 8.9–10.3)
Chloride: 110 mmol/L (ref 101–111)
Creatinine, Ser: 1.34 mg/dL — ABNORMAL HIGH (ref 0.44–1.00)
GFR, EST AFRICAN AMERICAN: 53 mL/min — AB (ref >60)
GFR, EST NON AFRICAN AMERICAN: 45 mL/min — AB (ref >60)
Glucose, Bld: 112 mg/dL — ABNORMAL HIGH (ref 65–99)
POTASSIUM: 4.1 mmol/L (ref 3.5–5.1)
SODIUM: 144 mmol/L (ref 135–145)

## 2017-07-22 LAB — CBC
HCT: 28.3 % — ABNORMAL LOW (ref 36.0–46.0)
HEMOGLOBIN: 8.6 g/dL — AB (ref 12.0–15.0)
MCH: 26.7 pg (ref 26.0–34.0)
MCHC: 30.4 g/dL (ref 30.0–36.0)
MCV: 87.9 fL (ref 78.0–100.0)
Platelets: 283 10*3/uL (ref 150–400)
RBC: 3.22 MIL/uL — AB (ref 3.87–5.11)
RDW: 14.5 % (ref 11.5–15.5)
WBC: 8.4 10*3/uL (ref 4.0–10.5)

## 2017-07-22 LAB — CULTURE, RESPIRATORY: SPECIAL REQUESTS: NORMAL

## 2017-07-22 LAB — CULTURE, RESPIRATORY W GRAM STAIN: Culture: NO GROWTH

## 2017-07-22 LAB — PROCALCITONIN: Procalcitonin: 10.78 ng/mL

## 2017-07-22 LAB — LEGIONELLA PNEUMOPHILA SEROGP 1 UR AG: L. pneumophila Serogp 1 Ur Ag: NEGATIVE

## 2017-07-22 LAB — MAGNESIUM: MAGNESIUM: 2 mg/dL (ref 1.7–2.4)

## 2017-07-22 MED ORDER — ORAL CARE MOUTH RINSE
15.0000 mL | Freq: Two times a day (BID) | OROMUCOSAL | Status: DC
  Administered 2017-07-22 – 2017-07-25 (×4): 15 mL via OROMUCOSAL

## 2017-07-22 MED ORDER — PRO-STAT SUGAR FREE PO LIQD
60.0000 mL | Freq: Two times a day (BID) | ORAL | Status: DC
  Administered 2017-07-22: 60 mL
  Filled 2017-07-22: qty 60

## 2017-07-22 MED ORDER — ORAL CARE MOUTH RINSE
15.0000 mL | OROMUCOSAL | Status: DC
  Administered 2017-07-22 (×8): 15 mL via OROMUCOSAL

## 2017-07-22 MED ORDER — ORAL CARE MOUTH RINSE: 15.0000 mL | Freq: Two times a day (BID) | OROMUCOSAL | Status: DC

## 2017-07-22 MED ORDER — VITAL HIGH PROTEIN PO LIQD
1000.0000 mL | ORAL | Status: DC
  Administered 2017-07-22: 1000 mL

## 2017-07-22 NOTE — Progress Notes (Addendum)
PULMONARY / CRITICAL CARE MEDICINE   Name: [name] MRN: [id number] DOB: 1965/06/17    ADMISSION DATE:  07/19/2017   CHIEF COMPLAINT: Extreme dyspnea.  HISTORY OF PRESENT ILLNESS:        This is a 52 year old who was admitted to this hospital from 4/19 two 4/21 For dyspnea.  She was treated empirically for CAP with a combination of azithromycin and Rocephin which was switched to [facility] on discharge.  Apparently her dyspnea returned to baseline until early this morning.  The patient also has a known history of congestive heart failure and her last echocardiogram on 06/01/2017 showed an ejection fraction between 25 and 30%.  This morning apparently she became acutely dyspneic.  Her sister is not aware of any known history of fevers chills sweats or cough in the previous 24 hours.  It is the patient's birthday and she is not aware of the patient having done anything peculiar for that event.  There is no known history of chest pain.  She presented to the emergency room in respiratory extremis with a cough productive of very thin rust colored or bloody sputum.  She was so dyspneic that she requested to be intubated.  The patient is intubated and sedated at the time of my exam.  SUBJECTIVE:  On 50 mcg fentanyl gtt, arouses to voice follows commands Calm, cooperative PRVC: 40% FiO2, 5 peep No vasopressors infusing  VITAL SIGNS: BP 133/83   Pulse 88   Temp 99 F (37.2 C) (Core)   Resp (!) 26   Ht _0  (1.753 m)   Wt 238 lb 12.1 oz (108.3 kg)   LMP 07/15/2017 (Exact Date)   SpO2 100%   BMI 35.26 kg/m   HEMODYNAMICS:    VENTILATOR SETTINGS: Vent Mode: PRVC FiO2 (%):  [40 %] 40 % Set Rate:  [26 bmp] 26 bmp Vt Set:  [400 mL] 400 mL PEEP:  [5 cmH20] 5 cmH20 Pressure Support:  [10 cmH20] 10 cmH20 Plateau Pressure:  [18 cmH20-22 cmH20] 22 cmH20  INTAKE / OUTPUT: I/O last 3 completed shifts: In: 3883 [I.V.:2983; Other:20; NG/GT:30; IV Piggyback:850] Out: 4287  [Urine:4920]  PHYSICAL EXAMINATION: General: Acutely ill-appearing female, laying in bed, intubated and ventilated, in no acute distress Neuro: Arouses to voice, follows commands, nods to questions, Pupils PERRL HEENT: Atraumatic, normocephalic, neck supple, ETT in place Cardiovascular: RRR, No M/R/G Lungs: Clear diminished upon auscultation, ventilator assisted, even, regular, non-labored, no assessory muscle use Abdomen: Obese, soft, non-tender, BS+ x4 Musculoskeletal: No deformities, 1+ bilateral LE edema  LABS:  BMET Recent Labs  Lab 07/20/17 0322 07/21/17 0337 07/22/17 0512  NA 138 142 144  K 3.7 3.4* 4.1  CL 109 113* 110  CO2 _1 BUN _2 CREATININE 1.28* 1.27* 1.34*  GLUCOSE 163* 78 112*   Electrolytes Recent Labs  Lab 07/20/17 0322 07/21/17 0337 07/22/17 0512  CALCIUM 7.5* 7.8* 8.6*  MG 1.3* 1.6* 2.0  PHOS 3.3  --   --    CBC Recent Labs  Lab 07/20/17 0322 07/21/17 0337 07/22/17 0512  WBC 18.2* 6.1 8.4  HGB 9.8* 8.3* 8.6*  HCT 32.4* 27.7* 28.3*  PLT 353 243 283   Coag's Recent Labs  Lab 07/19/17 1700  APTT 25  INR 1.21   Sepsis Markers Recent Labs  Lab 07/19/17 1347 07/19/17 1401 07/19/17 1700 07/20/17 0322 07/21/17 0337  LATICACIDVEN 3.4* 3.06* 2.7*  --   --   PROCALCITON 2.66  --   --  30.60 21.92    ABG Recent Labs  Lab 07/20/17 0339 07/20/17 1119 07/21/17 0311  PHART 7.276* 7.252* 7.339*  PCO2ART 41.7 46.9 40.1  PO2ART 91.0 128.0* 177.0*    Liver Enzymes No results for input(s): AST, ALT, ALKPHOS, BILITOT, ALBUMIN in the last 168 hours.  Cardiac Enzymes No results for input(s): TROPONINI, PROBNP in the last 168 hours.  Glucose Recent Labs  Lab 07/21/17 1134 07/21/17 1526 07/21/17 2013 07/21/17 2345 07/22/17 0348 07/22/17 0805  GLUCAP 209* 206* 227* 155* 112* 119*   Imaging No results found.   STUDIES:  Chest x-ray to my eye shows a well-placed endotracheal tube with the right much greater than  left infiltrates with very dense consolidation and air bronchograms in the right lower lobe.  EKG to my eye shows no acute changes.  Initial troponin is 0.  Lactic acid is elevated at 4.3, white count is 18,900  CULTURES: Blood culture x2 6/7>> Sputum culture 6/7>> Respiratory panel PCR 6/7>>negative Strep Pneumo Urinary antigen 6/7>>negative  ANTIBIOTICS: Vancomycin 6/7>> Zosyn 6/7>>  LINES/TUBES: R IJ CVC 6/7>> ETT 6/7>>  DISCUSSION:      This is a 52 year old with a known ejection fraction of 25 to 30% who was recently treated for community-acquired pneumonia.  She presents acutely short of breath without any indicators of cardiac ischemia and an asymmetric infiltrate on chest x-ray.  Intubated, treated for possible cardiogenic edema, pneumonia.  Consider acute lung injury  ASSESSMENT / PLAN:  PULMONARY A:  Acute respiratory failure in the setting of acute bilateral infiltrates ARDS physiology Suspected cardiogenic pulmonary edema although infiltrates are asymmetric and must also consider healthcare associated pneumonia > significantly improved on 6/9 chest x-ray, more consistent with pulmonary edema P: Low Tidal volume ventilator strategy Daily SBT when parameters met Continue Lasix 40 mg x1 dose today (received 2 doses 6/9) VAP protocol Abx as above Scheduled Bronchodilators  CARDIOVASCULAR A:  Acute on chronic systolic CHF Shock, cardiogenic plus probable septic shock>>resolved P: Continue diuresis with Lasix 40 mg x1 dose 6/10 (received 2 doses 6/9) Off Vasopressors Consider obtaining repeat Echocardiogram (last Echo 06/01/17)  RENAL A:  Acute renal failure, likely ATN Acute anion gap metabolic acidosis>>resolved P: Follow I&O's / urinary output Trend BMP Continue diuresis x1 dose today 6/10 (received 2 doses 6/9) Replace electrolytes as indicated Ensure adequate renal perfusion  GASTROINTESTINAL A:  Stress ulcer prophylaxis Nutrition  P:  Pepcid for  SUP Dietician consult for TF initiation  HEMATOLOGIC A:  Anemia DVT prophylaxis P: Monitor for s/sx of bleeding Trend CBC SQ Heparin for VTE prophylaxis Transfuse for Hgb <7  INFECTIOUS A:  Clinical picture, chest x-ray, procalcitonin all consistent with healthcare associated pneumonia -Pneumococcal antigen negative, Legionella pending Septic shock>>resolved P: Monitor fever curve Trend WBC's Continue Vancomycin & Zosyn Follow cultures Follow PCT   ENDOCRINE A: Hyperglycemia P: CBG's SSI Lantus 12 units QHS Follow ICU hyper/hypoglycemia protocol   NEUROLOGIC A: Sedation requirements for mechanical ventilation P: RASS goal: 0 to -1 Fentanyl gtt, prn versed pushes to maintain RASS goal Avoid sedating medications as able Daily WUA Provide supportive care Lights on during the day  FAMILY  -No family present at bedside 6/10 for update.  From previous notes, Dr. Veleta Miners discussed situation with pt's sister on 6/7.  Sister is in favor of acute interventions/temporary measures. Sister has reservations about prolonged care.    Darel Hong, AGACNP-BC [facility] 07/22/2017, 8:26 AM [phone] or if no answer [phone]  Attending Note:  52 year old female with CHF and pneumonia resulting into respiratory failure.  Patient is being treated for infection.  Diuresed 1.5 liters overnight.  On exam, weaning well, awake and interactive with coarse BS.  I reviewed CXR myself, ETT is in good position.  Discussed with PCCM-NP and RT.  Will stop fentanyl now, continue diureses, if continues to look well on exam in 30 minutes then will consider extubation.    The patient is critically ill with multiple organ systems failure and requires high complexity decision making for assessment and support, frequent evaluation and titration of therapies, application of advanced monitoring technologies and extensive interpretation of multiple databases.   Critical  Care Time devoted to patient care services described in this note is  35  Minutes. This time reflects time of care of this signee Dr Jennet Maduro. This critical care time does not reflect procedure time, or teaching time or supervisory time of PA/NP/Med student/Med Resident etc but could involve care discussion time.  Rush Farmer, M.D. [facility] Pulmonary/Critical Care Medicine. Pager: [phone]. After hours pager: [phone].

## 2017-07-22 NOTE — Progress Notes (Signed)
160cc Fentanyl gtt wasted down sink. Withnessed by Dahlia Bailiff, RN

## 2017-07-22 NOTE — Progress Notes (Signed)
Nutrition Follow-up / Consult  DOCUMENTATION CODES:   Obesity unspecified  INTERVENTION:    Vital High Protein at 40 ml/h (960 ml per day)  Pro-stat 60 ml BID  Provides 1360 kcal, 144 gm protein, 803 ml free water daily  NUTRITION DIAGNOSIS:   Inadequate oral intake related to inability to eat as evidenced by NPO status.  Ongoing  GOAL:   Provide needs based on ASPEN/SCCM guidelines  Unmet  MONITOR:   Vent status, TF tolerance, Labs, I & O's  REASON FOR ASSESSMENT:   Consult Enteral/tube feeding initiation and management  ASSESSMENT:   52 yo female with PMH of DM-2, HTN, NICM, CHF, stroke, asthma who was admitted on 6/7 with acute respiratory failure, bilateral infiltrates, pulmonary edema, acute on chronic CHF, acute renal failure. Required intubation on admission.  Discussed patient in ICU rounds with RN today. Received MD Consult for TF initiation and management. OGT in place. Patient is currently intubated on ventilator support MV: 8.8 L/min Temp (24hrs), Avg:99 F (37.2 C), Min:98.8 F (37.1 C), Max:99.3 F (37.4 C)  Propofol: off Labs reviewed. CBG's: [phone] Medications reviewed and include Novolog, Lantus, Fentanyl, Vanc, & Zosyn.    Diet Order:   Diet Order           Diet NPO time specified  Diet effective now          EDUCATION NEEDS:   No education needs have been identified at this time  Skin:  Skin Assessment: Reviewed RN Assessment  Last BM:  PTA  Height:   Ht Readings from Last 1 Encounters:  07/19/17 5\' 9"  (1.753 m)    Weight:   Wt Readings from Last 1 Encounters:  07/22/17 238 lb 12.1 oz (108.3 kg)    Ideal Body Weight:  65.91 kg  BMI:  Body mass index is 35.26 kg/m.  Estimated Nutritional Needs:   Kcal:  1080-1380 kcals (11-14 kcal/kg bw)  Protein:  >132g Pro (2g/kg ibw)  Fluid:  2 L    [name], RD, LDN, CNSC Pager [phone] After Hours Pager [phone]

## 2017-07-22 NOTE — Procedures (Signed)
Extubation Procedure Note  Patient Details:   Name: [name] DOB: 02-Mar-1965 MRN: [id number]   Airway Documentation:    Vent end date: 07/22/17 Vent end time: 1516   Evaluation  O2 sats: stable throughout Complications: No apparent complications Patient did tolerate procedure well. Bilateral Breath Sounds: Diminished   Yes   Pt extubated to 4L N/C.  No stridor noted.  RN @ bedside.   Christophe Louis 07/22/2017, 3:16 PM

## 2017-07-23 ENCOUNTER — Other Ambulatory Visit: Payer: Self-pay

## 2017-07-23 ENCOUNTER — Encounter (HOSPITAL_COMMUNITY): Payer: Self-pay | Admitting: General Practice

## 2017-07-23 ENCOUNTER — Inpatient Hospital Stay (HOSPITAL_COMMUNITY): Payer: Medicaid Other

## 2017-07-23 DIAGNOSIS — R5381 Other malaise: Secondary | ICD-10-CM

## 2017-07-23 DIAGNOSIS — J810 Acute pulmonary edema: Secondary | ICD-10-CM

## 2017-07-23 LAB — CBC
HCT: 28.7 % — ABNORMAL LOW (ref 36.0–46.0)
Hemoglobin: 8.7 g/dL — ABNORMAL LOW (ref 12.0–15.0)
MCH: 26.8 pg (ref 26.0–34.0)
MCHC: 30.3 g/dL (ref 30.0–36.0)
MCV: 88.3 fL (ref 78.0–100.0)
Platelets: 322 10*3/uL (ref 150–400)
RBC: 3.25 MIL/uL — AB (ref 3.87–5.11)
RDW: 14.5 % (ref 11.5–15.5)
WBC: 8.1 10*3/uL (ref 4.0–10.5)

## 2017-07-23 LAB — BASIC METABOLIC PANEL
Anion gap: 7 (ref 5–15)
BUN: 15 mg/dL (ref 6–20)
CALCIUM: 8.5 mg/dL — AB (ref 8.9–10.3)
CO2: 27 mmol/L (ref 22–32)
CREATININE: 1.4 mg/dL — AB (ref 0.44–1.00)
Chloride: 110 mmol/L (ref 101–111)
GFR calc non Af Amer: 43 mL/min — ABNORMAL LOW (ref >60)
GFR, EST AFRICAN AMERICAN: 50 mL/min — AB (ref >60)
Glucose, Bld: 134 mg/dL — ABNORMAL HIGH (ref 65–99)
Potassium: 3.6 mmol/L (ref 3.5–5.1)
SODIUM: 144 mmol/L (ref 135–145)

## 2017-07-23 LAB — PROCALCITONIN: Procalcitonin: 6.05 ng/mL

## 2017-07-23 LAB — GLUCOSE, CAPILLARY
GLUCOSE-CAPILLARY: 108 mg/dL — AB (ref 65–99)
GLUCOSE-CAPILLARY: 120 mg/dL — AB (ref 65–99)
Glucose-Capillary: 117 mg/dL — ABNORMAL HIGH (ref 65–99)
Glucose-Capillary: 180 mg/dL — ABNORMAL HIGH (ref 65–99)
Glucose-Capillary: 172 mg/dL — ABNORMAL HIGH (ref 65–99)

## 2017-07-23 MED ORDER — POTASSIUM CHLORIDE CRYS ER 20 MEQ PO TBCR: 40.0000 meq | EXTENDED_RELEASE_TABLET | Freq: Once | ORAL | Status: DC

## 2017-07-23 MED ORDER — FUROSEMIDE 40 MG PO TABS
40.0000 mg | ORAL_TABLET | Freq: Every day | ORAL | Status: DC
  Administered 2017-07-23 – 2017-07-24 (×2): 40 mg via ORAL
  Filled 2017-07-23 (×2): qty 1

## 2017-07-23 MED ORDER — SODIUM CHLORIDE 0.9% FLUSH
10.0000 mL | INTRAVENOUS | Status: DC | PRN
  Administered 2017-07-25: 10 mL
  Filled 2017-07-23: qty 40

## 2017-07-23 MED ORDER — POTASSIUM CHLORIDE CRYS ER 20 MEQ PO TBCR
20.0000 meq | EXTENDED_RELEASE_TABLET | Freq: Once | ORAL | Status: AC
  Administered 2017-07-23: 20 meq via ORAL
  Filled 2017-07-23: qty 1

## 2017-07-23 MED ORDER — SODIUM CHLORIDE 0.9% FLUSH
10.0000 mL | Freq: Two times a day (BID) | INTRAVENOUS | Status: DC
  Administered 2017-07-23 – 2017-07-25 (×4): 10 mL

## 2017-07-23 MED ORDER — CARVEDILOL 6.25 MG PO TABS
6.2500 mg | ORAL_TABLET | Freq: Two times a day (BID) | ORAL | Status: DC
  Administered 2017-07-23 – 2017-07-26 (×6): 6.25 mg via ORAL
  Filled 2017-07-23 (×6): qty 1

## 2017-07-23 MED ORDER — IPRATROPIUM-ALBUTEROL 0.5-2.5 (3) MG/3ML IN SOLN: 3.0000 mL | Freq: Four times a day (QID) | RESPIRATORY_TRACT | Status: DC | PRN

## 2017-07-23 NOTE — Progress Notes (Signed)
Quinlan Boom [id number] Admission Data: 07/23/2017 5:50 PM Attending Provider: Lynnell Jude, MD  BXI:[id number], Binnie Rail, MD Consults/ Treatment Team:   Wardine Gathers is a 52 y.o. female patient admitted from ED awake, alert  & orientated  X 3,  Full Code, VSS - Blood pressure (!) 151/91, pulse (!) 104, temperature (!) 100.4 F (38 C), temperature source Oral, resp. rate 18, height 5\' 9"  (1.753 m), weight 100.1 kg (220 lb 10.9 oz), last menstrual period 07/15/2017, SpO2 100 %., O2  no c/o shortness of breath, no c/o chest pain, no distress noted. Tele # 22 placed and pt is currently running:normal sinus rhythm.   IV site WDL:RUA midlin  Allergies:   Allergies  Allergen Reactions  . Lisinopril Hives and Swelling    Facial   . Sertraline Hives and Swelling    Facial   . Tramadol Hives and Swelling    facial  . Ibuprofen Hives and Swelling     Past Medical History:  Diagnosis Date  . Asthma 05/31/2017  . Diabetes mellitus, type 2 (HCC)    A1C 7.6 April '13  . Hypertension   . Left leg pain 07/21/2015  . Nonischemic cardiomyopathy (HCC)    Echo 05/19/11 EF 35% w/ mild-mod MR; R/L Heart Cath 05/21/11 - mildly elevated right heart pressures, normal left heart pressures, preserved cardiac output, widely patent coronary arteries without significant CAD and moderate global systolic LV dysfunction, EF 35-40%.  . 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 Systolic CHF (HCC)    Echo 05/19/11 EF 35% w/ mild-mod MR    Pt orientation to unit, room and routine.  No evidence of skin break down noted on exam.     Will cont to monitor and assist as needed.  Ranesha Val Consuella Lose, RN 07/23/2017 5:50 PM

## 2017-07-23 NOTE — Progress Notes (Signed)
Meredith Staggers, RN to pull central line, order completed.  Gasper Lloyd, RN VAST

## 2017-07-23 NOTE — Progress Notes (Addendum)
PULMONARY / CRITICAL CARE MEDICINE   Name: [name] MRN: [id number] DOB: 11/20/1965    ADMISSION DATE:  07/19/2017   CHIEF COMPLAINT: Extreme dyspnea.  HISTORY OF PRESENT ILLNESS:        This is a 52 year old who was admitted to this hospital from 4/19 two 4/21 For dyspnea.  She was treated empirically for CAP with a combination of azithromycin and Rocephin which was switched to [facility] on discharge.  Apparently her dyspnea returned to baseline until early this morning.  The patient also has a known history of congestive heart failure and her last echocardiogram on 06/01/2017 showed an ejection fraction between 25 and 30%.  This morning apparently she became acutely dyspneic.  Her sister is not aware of any known history of fevers chills sweats or cough in the previous 24 hours.  It is the patient's birthday and she is not aware of the patient having done anything peculiar for that event.  There is no known history of chest pain.  She presented to the emergency room in respiratory extremis with a cough productive of very thin rust colored or bloody sputum.  She was so dyspneic that she requested to be intubated.  The patient is intubated and sedated at the time of my exam.  SUBJECTIVE:  Awake, alert and oriented No complaints of pain, Denies SOB On 1L [place]  VITAL SIGNS: BP 135/76   Pulse 91   Temp 100 F (37.8 C) (Bladder)   Resp (!) 29   Ht 5\' 9"  (1.753 m)   Wt 233 lb 4 oz (105.8 kg)   LMP 07/15/2017 (Exact Date)   SpO2 100%   BMI 34.44 kg/m   HEMODYNAMICS:    VENTILATOR SETTINGS: Vent Mode: CPAP;PSV FiO2 (%):  [40 %] 40 % Set Rate:  [26 bmp] 26 bmp Vt Set:  [400 mL] 400 mL PEEP:  [5 cmH20] 5 cmH20 Pressure Support:  [5 cmH20] 5 cmH20 Plateau Pressure:  [16 cmH20] 16 cmH20  INTAKE / OUTPUT: I/O last 3 completed shifts: In: 1545.9 [I.V.:532.6; NG/GT:213.3; IV Piggyback:800] Out: 3655 [Urine:3655]  PHYSICAL EXAMINATION: General: Chronically ill-appearing, sitting in  bed, on 1L [place], in no acute distress Neuro: Awake, alert, oriented x4, follows commands, No focal deficits HEENT: Atraumatic, normocephalic, neck supple, no JVD Cardiovascular: RRR, no M/R/G Lungs: Clear throughout upon auscultation, even, non-labored, no assessory muscle use Abdomen: Obese, soft, tender, BS+ x4 Musculoskeletal: No deformities, active ROM all extremities, 1+ bilateral LE edema  LABS:  BMET Recent Labs  Lab 07/21/17 0337 07/22/17 0512 07/23/17 0500  NA 142 144 144  K 3.4* 4.1 3.6  CL 113* 110 110  CO2 24 25 27   BUN 10 10 15   CREATININE 1.27* 1.34* 1.40*  GLUCOSE 78 112* 134*   Electrolytes Recent Labs  Lab 07/20/17 0322 07/21/17 0337 07/22/17 0512 07/23/17 0500  CALCIUM 7.5* 7.8* 8.6* 8.5*  MG 1.3* 1.6* 2.0  --   PHOS 3.3  --   --   --    CBC Recent Labs  Lab 07/21/17 0337 07/22/17 0512 07/23/17 0500  WBC 6.1 8.4 8.1  HGB 8.3* 8.6* 8.7*  HCT 27.7* 28.3* 28.7*  PLT 243 283 322   Coag's Recent Labs  Lab 07/19/17 1700  APTT 25  INR 1.21   Sepsis Markers Recent Labs  Lab 07/19/17 1347 07/19/17 1401 07/19/17 1700  07/21/17 0337 07/22/17 1020 07/23/17 0500  LATICACIDVEN 3.4* 3.06* 2.7*  --   --   --   --  PROCALCITON 2.66  --   --    < > 21.92 10.78 6.05   < > = values in this interval not displayed.    ABG Recent Labs  Lab 07/20/17 0339 07/20/17 1119 07/21/17 0311  PHART 7.276* 7.252* 7.339*  PCO2ART 41.7 46.9 40.1  PO2ART 91.0 128.0* 177.0*    Liver Enzymes No results for input(s): AST, ALT, ALKPHOS, BILITOT, ALBUMIN in the last 168 hours.  Cardiac Enzymes No results for input(s): TROPONINI, PROBNP in the last 168 hours.  Glucose Recent Labs  Lab 07/22/17 1102 07/22/17 1522 07/22/17 1929 07/22/17 2308 07/23/17 0344 07/23/17 0716  GLUCAP 177* 157* 148* 142* 108* 120*   Imaging Dg Chest Port 1 View  Result Date: 07/23/2017 CLINICAL DATA:  Acute respiratory failure EXAM: PORTABLE CHEST 1 VIEW COMPARISON:   07/22/2017 FINDINGS: Cardiac shadow is stable. Endotracheal tube and nasogastric catheter have been removed in the interval. Right jugular central line is again seen. Improved aeration is noted in the bases bilaterally with resolution of previously seen bibasilar infiltrates. No pneumothorax is noted. IMPRESSION: Significant improved aeration bilaterally. Electronically Signed   By: Alcide Clever M.D.   On: 07/23/2017 08:50     STUDIES:  Chest x-ray to my eye shows a well-placed endotracheal tube with the right much greater than left infiltrates with very dense consolidation and air bronchograms in the right lower lobe.  EKG to my eye shows no acute changes.  Initial troponin is 0.  Lactic acid is elevated at 4.3, white count is 18,900  CULTURES: Blood culture x2 6/7>> Sputum culture 6/7>>negative Respiratory panel PCR 6/7>>negative Strep Pneumo Urinary antigen 6/7>>negative  ANTIBIOTICS: Vancomycin 6/7>>6/11 Zosyn 6/7>>  LINES/TUBES: R IJ CVC 6/7>>6/11 ETT 6/7>>6/10  DISCUSSION:      This is a 52 year old with a known ejection fraction of 25 to 30% who was recently treated for community-acquired pneumonia.  She presents acutely short of breath without any indicators of cardiac ischemia and an asymmetric infiltrate on chest x-ray.  Intubated, treated for possible cardiogenic edema, pneumonia.  Consider acute lung injury  ASSESSMENT / PLAN:  PULMONARY A:  Acute respiratory failure in the setting of acute bilateral infiltrates ARDS physiology Suspected cardiogenic pulmonary edema although infiltrates are asymmetric and must also consider healthcare associated pneumonia > significantly improved on 6/9 chest x-ray, more consistent with pulmonary edema -Extubated 6/10 P: Continue Zosyn, will d/c Vancomycin Completed IV Diuresis x3 doses Will start PO Lasix 40 mg daily (takes 40 mg BID at home) given mild AKI Scheduled Bronchodilators  CARDIOVASCULAR A:  Acute on chronic systolic  CHF Shock, cardiogenic plus probable septic shock>>resolved P: Competed IV diuresis x3 doses  Will transition to PO lasix 40 mg daily (takes 40 mg BID at home) given mild AKI Resume home coreg  RENAL A:  Acute renal failure, likely ATN Acute anion gap metabolic acidosis>>resolved P: Follow I&O's /urinary output Trend BMP (monitor closely with diuresis) Ensure adequate renal perfusion Replace electrolytes as indicated  GASTROINTESTINAL A:  Stress ulcer prophylaxis Nutrition  P:  Pepcid for SUP Advance diet as tolerated to heart healthy card modified diet  HEMATOLOGIC A:  Anemia DVT prophylaxis P: Monitor for s/sx of bleeding Trend CBC SQ heparin for VTE prophylaxis Transfuse for Hgb <7  INFECTIOUS A:  Clinical picture, chest x-ray, procalcitonin all consistent with healthcare associated pneumonia -Pneumococcal antigen negative, Legionella pending Septic shock>>resolved P: Monitor fever curve Trend WBC's Continue Zosyn, d/c Vancomycin 6/11 Follow cultures Follow PCT F/u CXR prn  ENDOCRINE A:  Hyperglycemia P: CBG's SSI Lantus 12 units QHS Continue to hold home metformin  NEUROLOGIC A:  No active issues P: Provide supportive care Lights on during the day  FAMILY  -No family present 6/11, pt updated 6/11.   Pt stable from pulmonary standpoint.  Can transfer from ICU to Telemetry unit, will transfer service to [facility] in am 6/12.  Remove central line and foley catheter, place midline catheter for IV abx and having difficulty obtaining peripheral IV access.  Consult PT to get pt mobilizing and OOB.  Harlon Ditty, AGACNP-BC [place] [facility] 07/23/2017, 10:48 AM  Attending Note:  52 year old female with CHF history presenting with HCAP and CHF exacerbation.  On exam, lungs are clear on exam.  I reviewed CXR myself, pulmonary edema noted.  Discussed with PCCM-NP and TRH-MD.    Acute respiratory failure:             - Monitor  for airway protection             - Extubated  Hypoxemia:             - Titrate O2 for sat of 88-92%  HCAP:             - D/C vanc             - Continue zosyn for total of 8 days             - F/u on cultures  Physical deconditioning:             - PT             - OOB to chair  Pulmonary edema:             - Lasix to be changed to PO  Transfer to tele and to [facility] with PCCM off 6/12.  Patient seen and examined, agree with above note.  I dictated the care and orders written for this patient under my direction.  Alyson Reedy, MD [phone]

## 2017-07-23 NOTE — Progress Notes (Signed)
Rport called to [facility]

## 2017-07-23 NOTE — Progress Notes (Signed)
[name] and Jeri Modena, NP at bedside, Midline ordered.  Both stated that vancomycin will not continue past 6 days as they are aware that this is the maximum amount of time vancomycin can be infused via midline.  Gasper Lloyd, RN VAST

## 2017-07-24 ENCOUNTER — Inpatient Hospital Stay (HOSPITAL_COMMUNITY): Payer: Medicaid Other

## 2017-07-24 LAB — BASIC METABOLIC PANEL
Anion gap: 10 (ref 5–15)
BUN: 15 mg/dL (ref 6–20)
CALCIUM: 8.7 mg/dL — AB (ref 8.9–10.3)
CO2: 24 mmol/L (ref 22–32)
CREATININE: 1.32 mg/dL — AB (ref 0.44–1.00)
Chloride: 108 mmol/L (ref 101–111)
GFR calc non Af Amer: 46 mL/min — ABNORMAL LOW (ref >60)
GFR, EST AFRICAN AMERICAN: 53 mL/min — AB (ref >60)
GLUCOSE: 115 mg/dL — AB (ref 65–99)
Potassium: 3.1 mmol/L — ABNORMAL LOW (ref 3.5–5.1)
Sodium: 142 mmol/L (ref 135–145)

## 2017-07-24 LAB — GLUCOSE, CAPILLARY
GLUCOSE-CAPILLARY: 81 mg/dL (ref 65–99)
GLUCOSE-CAPILLARY: 113 mg/dL — AB (ref 65–99)
Glucose-Capillary: 122 mg/dL — ABNORMAL HIGH (ref 65–99)
Glucose-Capillary: 123 mg/dL — ABNORMAL HIGH (ref 65–99)
Glucose-Capillary: 125 mg/dL — ABNORMAL HIGH (ref 65–99)
Glucose-Capillary: 168 mg/dL — ABNORMAL HIGH (ref 65–99)

## 2017-07-24 LAB — CBC
HCT: 29.7 % — ABNORMAL LOW (ref 36.0–46.0)
Hemoglobin: 9 g/dL — ABNORMAL LOW (ref 12.0–15.0)
MCH: 26.8 pg (ref 26.0–34.0)
MCHC: 30.3 g/dL (ref 30.0–36.0)
MCV: 88.4 fL (ref 78.0–100.0)
Platelets: 285 10*3/uL (ref 150–400)
RBC: 3.36 MIL/uL — ABNORMAL LOW (ref 3.87–5.11)
RDW: 14.5 % (ref 11.5–15.5)
WBC: 8.8 10*3/uL (ref 4.0–10.5)

## 2017-07-24 LAB — CULTURE, BLOOD (ROUTINE X 2)
CULTURE: NO GROWTH
CULTURE: NO GROWTH
Special Requests: ADEQUATE
Special Requests: ADEQUATE

## 2017-07-24 MED ORDER — FUROSEMIDE 10 MG/ML IJ SOLN
40.0000 mg | Freq: Two times a day (BID) | INTRAMUSCULAR | Status: DC
  Administered 2017-07-24 – 2017-07-25 (×2): 40 mg via INTRAVENOUS
  Filled 2017-07-24 (×2): qty 4

## 2017-07-24 MED ORDER — POTASSIUM CHLORIDE CRYS ER 20 MEQ PO TBCR
40.0000 meq | EXTENDED_RELEASE_TABLET | Freq: Once | ORAL | Status: AC
  Administered 2017-07-24: 40 meq via ORAL
  Filled 2017-07-24: qty 2

## 2017-07-24 MED ORDER — GUAIFENESIN ER 600 MG PO TB12
600.0000 mg | ORAL_TABLET | Freq: Two times a day (BID) | ORAL | Status: DC
  Administered 2017-07-24 – 2017-07-26 (×5): 600 mg via ORAL
  Filled 2017-07-24 (×5): qty 1

## 2017-07-24 MED ORDER — PREMIER PROTEIN SHAKE
11.0000 [oz_av] | Freq: Two times a day (BID) | ORAL | Status: DC
  Administered 2017-07-24 – 2017-07-25 (×3): 11 [oz_av] via ORAL
  Filled 2017-07-24 (×7): qty 325.31

## 2017-07-24 MED ORDER — FUROSEMIDE 10 MG/ML IJ SOLN
20.0000 mg | Freq: Once | INTRAMUSCULAR | Status: AC
  Administered 2017-07-24: 20 mg via INTRAVENOUS
  Filled 2017-07-24: qty 2

## 2017-07-24 MED ORDER — GUAIFENESIN-DM 100-10 MG/5ML PO SYRP
10.0000 mL | ORAL_SOLUTION | ORAL | Status: DC | PRN
  Administered 2017-07-24 – 2017-07-25 (×4): 10 mL via ORAL
  Filled 2017-07-24 (×5): qty 10

## 2017-07-24 MED ORDER — FUROSEMIDE 40 MG PO TABS: 40.0000 mg | ORAL_TABLET | Freq: Two times a day (BID) | ORAL | Status: DC

## 2017-07-24 NOTE — Progress Notes (Signed)
eLink Physician-Brief Progress Note Patient Name: [name] DOB: January 25, 1966 MRN: [id number]   Date of Service  07/24/2017  HPI/Events of Note  Patient requests cough medication.   eICU Interventions  Will order: 1. Robitussin DM 10 mL PO Q 4 hours PRN cough.     Intervention Category Major Interventions: Other:  Lenell Antu 07/24/2017, 1:49 AM

## 2017-07-24 NOTE — Progress Notes (Signed)
Physical Therapy Evaluation Patient Details Name: Ophia Shamoon MRN: [id number] DOB: 1965/03/30 Today's Date: 07/24/2017   History of Present Illness  Patient is a 52 y/o female presenting to the ED on 07/19/17 due to dyspnea with requests to be intubated. Patient admitted with acute respiratory failure, acute on chronic systolic CHF, and acute renal failure - intubated on 6/7, extubated on 6/10. Patient with a PMH significant for asthma, DM2, HTN, nonischemic cardiomyopathy, CVA, and systolic CHF.   Clinical Impression  Ms. Bays is a very pleasant 52 y/o female admitted with the above listed diagnosis. Patient reports that prior to admission, she was independent with all aspects of mobility. Today only requiring SUP for transfers and gait with RW for general safety. SaO2 levels maintained at or above 95% on RA throughout session PT to continue to follow acutely to progress independence with mobility.     Follow Up Recommendations No PT follow up    Equipment Recommendations  None recommended by PT    Recommendations for Other Services       Precautions / Restrictions Precautions Precautions: Fall Restrictions Weight Bearing Restrictions: No      Mobility  Bed Mobility Overal bed mobility: Modified Independent                Transfers Overall transfer level: Needs assistance Equipment used: Rolling walker (2 wheeled) Transfers: Sit to/from Stand Sit to Stand: Supervision         General transfer comment: for safety  Ambulation/Gait Ambulation/Gait assistance: Supervision Ambulation Distance (Feet): 120 Feet Assistive device: Rolling walker (2 wheeled) Gait Pattern/deviations: Step-through pattern;Decreased stride length Gait velocity: decreased   General Gait Details: aware of surroundings with ability to scan environment without LOB  Stairs            Wheelchair Mobility    Modified Rankin (Stroke Patients Only)       Balance Overall balance  assessment: Mild deficits observed, not formally tested                                           Pertinent Vitals/Pain Pain Assessment: No/denies pain    Home Living Family/patient expects to be discharged to:: Private residence Living Arrangements: Non-relatives/Friends Available Help at Discharge: Friend(s) Type of Home: House Home Access: Stairs to enter   Secretary/administrator of Steps: 2 Home Layout: Two level Home Equipment: None      Prior Function Level of Independence: Independent               Hand Dominance        Extremity/Trunk Assessment        Lower Extremity Assessment Lower Extremity Assessment: Overall WFL for tasks assessed       Communication   Communication: No difficulties  Cognition Arousal/Alertness: Awake/alert Behavior During Therapy: WFL for tasks assessed/performed Overall Cognitive Status: Within Functional Limits for tasks assessed                                        General Comments      Exercises     Assessment/Plan    PT Assessment Patient needs continued PT services  PT Problem List Decreased strength;Decreased activity tolerance;Decreased balance;Decreased mobility;Decreased knowledge of use of DME;Decreased safety awareness       PT Treatment  Interventions DME instruction;Gait training;Stair training;Functional mobility training;Therapeutic activities;Therapeutic exercise;Balance training;Patient/family education    PT Goals (Current goals can be found in the Care Plan section)  Acute Rehab PT Goals Patient Stated Goal: return home PT Goal Formulation: With patient Time For Goal Achievement: 07/31/17 Potential to Achieve Goals: Good    Frequency Min 3X/week   Barriers to discharge        Co-evaluation               AM-PAC PT "6 Clicks" Daily Activity  Outcome Measure Difficulty turning over in bed (including adjusting bedclothes, sheets and blankets)?: A  Little Difficulty moving from lying on back to sitting on the side of the bed? : A Little Difficulty sitting down on and standing up from a chair with arms (e.g., wheelchair, bedside commode, etc,.)?: A Little Help needed moving to and from a bed to chair (including a wheelchair)?: A Little Help needed walking in hospital room?: A Little Help needed climbing 3-5 steps with a railing? : A Little 6 Click Score: 18    End of Session Equipment Utilized During Treatment: Gait belt Activity Tolerance: Patient tolerated treatment well Patient left: in chair;with call bell/phone within reach Nurse Communication: Mobility status PT Visit Diagnosis: Unsteadiness on feet (R26.81);Other abnormalities of gait and mobility (R26.89)    Time: 9833-8250 PT Time Calculation (min) (ACUTE ONLY): 24 min   Charges:   PT Evaluation $PT Eval Moderate Complexity: 1 Mod     PT G Codes:        Kipp Laurence, PT, DPT 07/24/17 12:37 PM

## 2017-07-24 NOTE — Progress Notes (Signed)
Nutrition Follow-up  DOCUMENTATION CODES:   Obesity unspecified  INTERVENTION:  Premier Protein BID each supplement provides 160 calories and 30 grams of protein  NUTRITION DIAGNOSIS:   Increased nutrient needs related to acute illness as evidenced by estimated needs. -new dx  GOAL:   Patient will meet greater than or equal to 90% of their needs -progressing  MONITOR:   PO intake, Supplement acceptance, Weight trends  ASSESSMENT:   52 yo female with PMH of DM-2, HTN, NICM, CHF, stroke, asthma who was admitted on 6/7 with acute respiratory failure, bilateral infiltrates, pulmonary edema, acute on chronic CHF, acute renal failure. Required intubation on admission.  Followed up with patient. This morning she states PO intake has been poor since extubation, has not had much of an appetite. Also has been receiving cold food from the kitchen, thus she states she has been eating yogurt, pop-sickles, and graham crackers brought to her from the floor nurse.  PTA she was eating 2-3 meals a day with snacks. Patient was unsure of UBW, but denies any weight loss, states her weight fluctuates. Per chart she has been gaining weight from 211 pounds to 217 pounds over the past 6 months.  She was open to consuming premier protein. Noted 80% meal completion at lunch this afternoon. Will monitor.  Labs reviewed:  K+ 3.1 Medications reviewed and include:  Insulin, 40K+   NUTRITION - FOCUSED PHYSICAL EXAM:    Most Recent Value  Orbital Region  No depletion  Upper Arm Region  No depletion  Thoracic and Lumbar Region  No depletion  Buccal Region  No depletion  Temple Region  No depletion  Clavicle Bone Region  No depletion  Clavicle and Acromion Bone Region  No depletion  Scapular Bone Region  No depletion  Dorsal Hand  No depletion  Patellar Region  No depletion  Anterior Thigh Region  No depletion  Posterior Calf Region  No depletion  Edema (RD Assessment)  Mild       Diet  Order:   Diet Order           Diet Carb Modified Fluid consistency: Thin; Room service appropriate? Yes  Diet effective now          EDUCATION NEEDS:   No education needs have been identified at this time  Skin:  Skin Assessment: Reviewed RN Assessment  Last BM:  07/23/2017  Height:   Ht Readings from Last 1 Encounters:  07/23/17 5\' 9"  (1.753 m)    Weight:   Wt Readings from Last 1 Encounters:  07/24/17 217 lb 9.5 oz (98.7 kg)    Ideal Body Weight:  65.91 kg  BMI:  Body mass index is 32.13 kg/m.  Estimated Nutritional Needs:   Kcal:  2449-7530 calories  Protein:  110-130 grams  Fluid:  2L    Dionne Ano. Kenly Xiao, MS, RD LDN Inpatient Clinical Dietitian Pager [phone]

## 2017-07-24 NOTE — Progress Notes (Signed)
Patient Demographics:    [name], is a 52 y.o. female, DOB - 1965-02-26, UIV:[id number]  Admit date - 07/19/2017   Admitting Physician Sampson Goon, MD  Outpatient Primary MD for the patient is Ladell Pier, MD  LOS - 5   Chief Complaint  Patient presents with  . Shortness of Breath        Subjective:    [name] today has no fevers, no emesis,  No chest pain, dyspnea on exertion persist, orthopnea persists, shortness of breath at rest has resolved, voiding well`  Assessment  & Plan :    Active Problems:   Respiratory failure (HCC)   Acute pulmonary edema [facility])   Physical deconditioning  Brief Summary:- 52 yo female with PMH of DM-2, HTN, NICM, CHF, stroke, asthma who was admitted on 07/19/17 with acute respiratory failure, bilateral infiltrates, pulmonary edema, acute on chronic CHF, acute renal failure. Required intubation on 07/19/2017 on admission, extubated 07/22/2017  Plan:-  1)HFrEF--acute on chronic systolic dysfunction CHF in the setting of nonischemic cardiomyopathy, shortness of breath at rest is better, dyspnea on exertion persist, last known EF 25% to 30 % (down from 30 to 35% in August 2018), left heart cath from 05/2011 without obstructive CAD, patient takes Lasix 40 mg p.o. twice daily at home, change this to IV, accurate fluid intake and input monitoring, daily weights, continue Coreg 6.25 mg twice daily.  After discharge patient will need to follow-up with Dr. Rayann Heman again to further discuss possible AICD placement  2)Acute Hypoxic Respiratory failure secondary to #1 above, required intubation on 07/19/2017, extubated on 07/22/2017  3)Possible HCAP-clinically and radiologically respiratory symptoms appear more likely to be secondary to CHF exacerbation as noted above, however patient does have fever (T max 100.4 on 07/23/17) rather than frank pneumonia, vancomycin was  started on 07/19/2017 and discontinued on 07/23/2017, Zosyn started on 07/19/2017, will continue for now, continue Mucinex, bronchodilators.  No further hypoxia, procalcitonin is trending down  CULTURES: Blood culture x2 6/7>> Sputum culture 6/7>>negative Respiratory panel PCR 6/7>>negative Strep Pneumo Urinary antigen 6/7>>negative  4)DM-uncontrolled with A1c of 10.0, continue Lantus insulin 12 units nightly, Use Novolog/Humalog Sliding scale insulin with Accu-Cheks/Fingersticks as ordered   5)AKI----acute kidney injury-  creatinine on admission= 0.93  ,   baseline creatinine = 0.9   , creatinine is now= 1.3 (peak 1.4), Avoid nephrotoxic agents / dehydration /  hypotension   Code Status : Full    Disposition Plan  : home  Consults  :  PCCM   DVT Prophylaxis  :    Heparin    Lab Results  Component Value Date   PLT 285 07/24/2017    Inpatient Medications  Scheduled Meds: . carvedilol  6.25 mg Oral BID WC  . chlorhexidine gluconate (MEDLINE KIT)  15 mL Mouth Rinse BID  . Chlorhexidine Gluconate Cloth  6 each Topical Daily  . furosemide  40 mg Oral BID  . guaiFENesin  600 mg Oral BID  . heparin injection (subcutaneous)  5,000 Units Subcutaneous Q8H  . insulin aspart  0-15 Units Subcutaneous Q4H  . insulin glargine  12 Units Subcutaneous QHS  . mouth rinse  15 mL Mouth Rinse q12n4p  . protein supplement shake  11 oz Oral  BID BM  . sodium chloride flush  10-40 mL Intracatheter Q12H  . sodium chloride flush  10-40 mL Intracatheter Q12H   Continuous Infusions: . sodium chloride    . piperacillin-tazobactam (ZOSYN)  IV Stopped (07/24/17 1304)   PRN Meds:.sodium chloride, acetaminophen (TYLENOL) oral liquid 160 mg/5 mL, guaiFENesin-dextromethorphan, ipratropium-albuterol, ondansetron (ZOFRAN) IV, sodium chloride flush, sodium chloride flush    Anti-infectives (From admission, onward)   Start     Dose/Rate Route Frequency Ordered Stop   07/20/17 1800  vancomycin (VANCOCIN) IVPB  750 mg/150 ml premix  Status:  Discontinued     750 mg 150 mL/hr over 60 Minutes Intravenous Every 12 hours 07/20/17 0931 07/23/17 1135   07/19/17 2200  vancomycin (VANCOCIN) IVPB 1000 mg/200 mL premix  Status:  Discontinued     1,000 mg 200 mL/hr over 60 Minutes Intravenous Every 8 hours 07/19/17 1449 07/20/17 0931   07/19/17 1800  piperacillin-tazobactam (ZOSYN) IVPB 3.375 g     3.375 g 12.5 mL/hr over 240 Minutes Intravenous Every 8 hours 07/19/17 1449     07/19/17 1145  piperacillin-tazobactam (ZOSYN) IVPB 3.375 g     3.375 g 100 mL/hr over 30 Minutes Intravenous  Once 07/19/17 1140 07/19/17 1245   07/19/17 1145  vancomycin (VANCOCIN) 2,000 mg in sodium chloride 0.9 % 500 mL IVPB     2,000 mg 250 mL/hr over 120 Minutes Intravenous  Once 07/19/17 1142 07/19/17 1501        Objective:   Vitals:   07/24/17 0500 07/24/17 0542 07/24/17 0854 07/24/17 1424  BP:  (!) 146/90 (!) 147/82 (!) 143/90  Pulse:  96 89 92  Resp:  12  16  Temp:  98.4 F (36.9 C)  98.4 F (36.9 C)  TempSrc:  Oral  Oral  SpO2:  100%  98%  Weight: 98.7 kg (217 lb 9.5 oz)     Height:        Wt Readings from Last 3 Encounters:  07/24/17 98.7 kg (217 lb 9.5 oz)  06/01/17 98.4 kg (216 lb 14.4 oz)  04/09/17 98 kg (216 lb)     Intake/Output Summary (Last 24 hours) at 07/24/2017 1639 Last data filed at 07/24/2017 1425 Gross per 24 hour  Intake 732 ml  Output 1000 ml  Net -268 ml     Physical Exam  Gen:- Awake Alert,  In no apparent distress  HEENT:- [place].AT, No sclera icterus Neck-Supple Neck,No JVD,.  Lungs-diminished in bases  with faint rales CV- S1, S2 normal Abd-  +ve B.Sounds, Abd Soft, No tenderness,    Extremity/Skin:-Trace edema, good pulses  psych-affect is appropriate, oriented x3 Neuro-no new focal deficits, no tremors   Data Review:   Micro Results Recent Results (from the past 240 hour(s))  Blood culture (routine x 2)     Status: None   Collection Time: 07/19/17 12:05 PM  Result  Value Ref Range Status   Specimen Description BLOOD RIGHT FOREARM  Final   Special Requests   Final    BOTTLES DRAWN AEROBIC AND ANAEROBIC Blood Culture adequate volume   Culture   Final    NO GROWTH 5 DAYS Performed at [facility], [place] [address] [place],  [address]    Report Status 07/24/2017 FINAL  Final  Respiratory Panel by PCR     Status: None   Collection Time: 07/19/17  1:29 PM  Result Value Ref Range Status   Adenovirus NOT DETECTED NOT DETECTED Final   Coronavirus 229E NOT DETECTED NOT  DETECTED Final   Coronavirus HKU1 NOT DETECTED NOT DETECTED Final   Coronavirus NL63 NOT DETECTED NOT DETECTED Final   Coronavirus OC43 NOT DETECTED NOT DETECTED Final   Metapneumovirus NOT DETECTED NOT DETECTED Final   Rhinovirus / Enterovirus NOT DETECTED NOT DETECTED Final   Influenza A NOT DETECTED NOT DETECTED Final   Influenza B NOT DETECTED NOT DETECTED Final   Parainfluenza Virus 1 NOT DETECTED NOT DETECTED Final   Parainfluenza Virus 2 NOT DETECTED NOT DETECTED Final   Parainfluenza Virus 3 NOT DETECTED NOT DETECTED Final   Parainfluenza Virus 4 NOT DETECTED NOT DETECTED Final   Respiratory Syncytial Virus NOT DETECTED NOT DETECTED Final   Bordetella pertussis NOT DETECTED NOT DETECTED Final   Chlamydophila pneumoniae NOT DETECTED NOT DETECTED Final   Mycoplasma pneumoniae NOT DETECTED NOT DETECTED Final    Comment: Performed at [facility], [place] [address] [place], [place] [address]  Blood culture (routine x 2)     Status: None   Collection Time: 07/19/17  1:45 PM  Result Value Ref Range Status   Specimen Description BLOOD CENTRAL LINE  Final   Special Requests   Final    BOTTLES DRAWN AEROBIC AND ANAEROBIC Blood Culture adequate volume   Culture   Final    NO GROWTH 5 DAYS Performed at [facility], [address], [place], [place] [address]    Report Status 07/24/2017 FINAL  Final  MRSA PCR Screening     Status: None   Collection Time: 07/19/17   3:06 PM  Result Value Ref Range Status   MRSA by PCR NEGATIVE NEGATIVE Final    Comment:        The GeneXpert MRSA Assay (FDA approved for NASAL specimens only), is one component of a comprehensive MRSA colonization surveillance program. It is not intended to diagnose MRSA infection nor to guide or monitor treatment for MRSA infections. Performed at [facility], [place] [address] [place], [place] [address]   Culture, respiratory (NON-Expectorated)     Status: None   Collection Time: 07/19/17  5:45 PM  Result Value Ref Range Status   Specimen Description TRACHEAL ASPIRATE  Final   Special Requests Normal  Final   Gram Stain   Final    FEW WBC PRESENT, PREDOMINANTLY PMN NO ORGANISMS SEEN    Culture   Final    NO GROWTH 3 DAYS Performed at [facility], [place] [address], [place], [place] [address]    Report Status 07/22/2017 FINAL  Final    Radiology Reports Dg Chest Port 1 View  Result Date: 07/24/2017 CLINICAL DATA:  Shortness of Breath EXAM: PORTABLE CHEST 1 VIEW COMPARISON:  07/23/2017 FINDINGS: Cardiomegaly with vascular congestion. No overt edema. No confluent opacities or effusions. No acute bony abnormality. IMPRESSION: Cardiomegaly, vascular congestion. Electronically Signed   By: Rolm Baptise M.D.   On: 07/24/2017 08:58   Dg Chest Port 1 View  Result Date: 07/23/2017 CLINICAL DATA:  Acute respiratory failure EXAM: PORTABLE CHEST 1 VIEW COMPARISON:  07/22/2017 FINDINGS: Cardiac shadow is stable. Endotracheal tube and nasogastric catheter have been removed in the interval. Right jugular central line is again seen. Improved aeration is noted in the bases bilaterally with resolution of previously seen bibasilar infiltrates. No pneumothorax is noted. IMPRESSION: Significant improved aeration bilaterally. Electronically Signed   By: Inez Catalina M.D.   On: 07/23/2017 08:50   Dg Chest Port 1 View  Result Date: 07/22/2017 CLINICAL DATA:  Acute  respiratory failure  EXAM: PORTABLE CHEST 1 VIEW COMPARISON:  Portable chest x-ray of 07/21/2017 FINDINGS: The tip of the endotracheal tube is approximately 5.2 cm above the carina. Airspace disease has improved somewhat although opacities remain at both lung bases left-greater-than-right. Cardiomegaly is stable. Right central venous line tip overlies the lower SVC. NG tube extends below the hemidiaphragm. IMPRESSION: 1. Slight improvement in airspace disease with persistent opacities at both lung bases. 2. Endotracheal tube tip 5.2 cm above the carina. Electronically Signed   By: Ivar Drape M.D.   On: 07/22/2017 08:37   Dg Chest Port 1 View  Result Date: 07/21/2017 CLINICAL DATA:  Acute respiratory failure EXAM: PORTABLE CHEST 1 VIEW COMPARISON:  07/19/2017 chest radiograph. FINDINGS: Endotracheal tube tip is 2.7 cm above the carina. Enteric tube enters stomach with the tip not seen on this image. Right internal jugular central venous catheter terminates over the lower third of the SVC. Stable cardiomediastinal silhouette with mild cardiomegaly. No pneumothorax. No pleural effusion. Mild hazy parahilar opacities, substantially decreased. IMPRESSION: 1. Well-positioned support structures as detailed. 2. Stable cardiomegaly. Mild hazy parahilar opacities, substantially decreased, favoring mild residual improving pulmonary edema. Electronically Signed   By: Ilona Sorrel M.D.   On: 07/21/2017 08:49   Dg Chest Portable 1 View  Result Date: 07/19/2017 CLINICAL DATA:  Status post central line placement EXAM: PORTABLE CHEST 1 VIEW COMPARISON:  July 19, 2017 12:19 p.m. FINDINGS: The heart size and mediastinal contours are stable. Right central venous line is identified with distal tip probably in the superior vena cava. Endotracheal tube is seen in good position with distal tip 5 cm from carina. Nasogastric tube is identified with distal tip not included on film but is at least in the stomach. Diffuse consolidation of the right lung and  patchy consolidation of the left upper and mid lung are worse compared prior exam. The visualized skeletal structures are stable. IMPRESSION: Right central venous line distal tip is in the superior vena cava. No definite pneumothorax is identified. Pulmonary edema versus bilateral pneumonia are worse compared to prior earlier exam. Electronically Signed   By: Abelardo Diesel M.D.   On: 07/19/2017 14:09   Dg Chest Portable 1 View  Result Date: 07/19/2017 CLINICAL DATA:  52 year old female with shortness of breath this morning, pink frothy sputum. Possible congestive heart failure, intubated. EXAM: PORTABLE CHEST 1 VIEW COMPARISON:  1125 hours today and earlier. FINDINGS: Portable AP semi upright view at 1219 hours. Endotracheal tube tip in good position at the level the clavicles. Mildly larger lung volumes. Stable cardiomegaly and mediastinal contours. Confluent and indistinct right greater than left pulmonary opacity with air bronchograms at the right lung base, not significantly changed. No superimposed pneumothorax. No pleural effusion is evident. Paucity bowel gas in the upper abdomen. No acute osseous abnormality identified. IMPRESSION: 1. ET tube in good position at the level the clavicles. 2. Mildly larger lung volumes but otherwise stable chest with right greater than left airspace disease. Top differential considerations are bilateral pneumonia and asymmetric pulmonary edema. Electronically Signed   By: Genevie Ann M.D.   On: 07/19/2017 12:34   Dg Chest Portable 1 View  Result Date: 07/19/2017 CLINICAL DATA:  Severe shortness of breath. EXAM: PORTABLE CHEST 1 VIEW COMPARISON:  Chest CT and chest x-ray dated 05/31/2017 FINDINGS: There are extensive patchy areas of consolidative infiltrate in the right lung with small patchy areas of infiltrate in the left mid and lower lung zone. Heart size is within normal limits.  Pulmonary vascularity appears normal. No discrete effusions. No bone abnormality. IMPRESSION:  Bilateral pulmonary infiltrates, much worse on the right than the left, similar to the appearance on the prior CT scan. This probably represents multifocal pneumonia. Electronically Signed   By: Lorriane Shire M.D.   On: 07/19/2017 11:48     CBC Recent Labs  Lab 07/20/17 0322 07/21/17 0337 07/22/17 0512 07/23/17 0500 07/24/17 0714  WBC 18.2* 6.1 8.4 8.1 8.8  HGB 9.8* 8.3* 8.6* 8.7* 9.0*  HCT 32.4* 27.7* 28.3* 28.7* 29.7*  PLT 353 243 283 322 285  MCV 88.3 88.5 87.9 88.3 88.4  MCH 26.7 26.5 26.7 26.8 26.8  MCHC 30.2 30.0 30.4 30.3 30.3  RDW 14.5 14.6 14.5 14.5 14.5    Chemistries  Recent Labs  Lab 07/20/17 0322 07/21/17 0337 07/22/17 0512 07/23/17 0500 07/24/17 0714  NA 138 142 144 144 142  K 3.7 3.4* 4.1 3.6 3.1*  CL 109 113* 110 110 108  CO2 22 24 25 27 24   GLUCOSE 163* 78 112* 134* 115*  BUN 15 10 10 15 15   CREATININE 1.28* 1.27* 1.34* 1.40* 1.32*  CALCIUM 7.5* 7.8* 8.6* 8.5* 8.7*  MG 1.3* 1.6* 2.0  --   --    ------------------------------------------------------------------------------------------------------------------ No results for input(s): CHOL, HDL, LDLCALC, TRIG, CHOLHDL, LDLDIRECT in the last 72 hours.  Lab Results  Component Value Date   HGBA1C 10.0 (H) 07/19/2017   ------------------------------------------------------------------------------------------------------------------ No results for input(s): TSH, T4TOTAL, T3FREE, THYROIDAB in the last 72 hours.  Invalid input(s): FREET3 ------------------------------------------------------------------------------------------------------------------ No results for input(s): VITAMINB12, FOLATE, FERRITIN, TIBC, IRON, RETICCTPCT in the last 72 hours.  Coagulation profile Recent Labs  Lab 07/19/17 1700  INR 1.21    No results for input(s): DDIMER in the last 72 hours.  Cardiac Enzymes No results for input(s): CKMB, TROPONINI, MYOGLOBIN in the last 168 hours.  Invalid input(s):  CK ------------------------------------------------------------------------------------------------------------------    Component Value Date/Time   BNP 582.5 (H) 07/19/2017 1137   BNP 470.7 (H) 05/05/2015 1645     Alyssa Rotondo M.D on 07/24/2017 at 4:39 PM  Between 7am to 7pm - Pager - [phone]  After 7pm go to [email or web address] - password [email or web address]  [facility] -  Office  [phone]   Voice Recognition Viviann Spare dictation system was used to create this note, attempts have been made to correct errors. Please contact the author with questions and/or clarifications.

## 2017-07-25 LAB — GLUCOSE, CAPILLARY
GLUCOSE-CAPILLARY: 138 mg/dL — AB (ref 65–99)
Glucose-Capillary: 100 mg/dL — ABNORMAL HIGH (ref 65–99)
Glucose-Capillary: 91 mg/dL (ref 65–99)
Glucose-Capillary: 197 mg/dL — ABNORMAL HIGH (ref 65–99)
Glucose-Capillary: 151 mg/dL — ABNORMAL HIGH (ref 65–99)
Glucose-Capillary: 157 mg/dL — ABNORMAL HIGH (ref 65–99)

## 2017-07-25 LAB — CBC
HCT: 32 % — ABNORMAL LOW (ref 36.0–46.0)
HEMOGLOBIN: 9.8 g/dL — AB (ref 12.0–15.0)
MCH: 26.4 pg (ref 26.0–34.0)
MCHC: 30.6 g/dL (ref 30.0–36.0)
MCV: 86.3 fL (ref 78.0–100.0)
Platelets: 319 10*3/uL (ref 150–400)
RBC: 3.71 MIL/uL — AB (ref 3.87–5.11)
RDW: 14.4 % (ref 11.5–15.5)
WBC: 8.9 10*3/uL (ref 4.0–10.5)

## 2017-07-25 LAB — BASIC METABOLIC PANEL
ANION GAP: 13 (ref 5–15)
BUN: 14 mg/dL (ref 6–20)
CHLORIDE: 101 mmol/L (ref 101–111)
CO2: 27 mmol/L (ref 22–32)
Calcium: 8.7 mg/dL — ABNORMAL LOW (ref 8.9–10.3)
Creatinine, Ser: 1.44 mg/dL — ABNORMAL HIGH (ref 0.44–1.00)
GFR calc Af Amer: 48 mL/min — ABNORMAL LOW (ref >60)
GFR, EST NON AFRICAN AMERICAN: 42 mL/min — AB (ref >60)
GLUCOSE: 128 mg/dL — AB (ref 65–99)
POTASSIUM: 2.8 mmol/L — AB (ref 3.5–5.1)
Sodium: 141 mmol/L (ref 135–145)

## 2017-07-25 LAB — MAGNESIUM: Magnesium: 1.8 mg/dL (ref 1.7–2.4)

## 2017-07-25 MED ORDER — INSULIN ASPART 100 UNIT/ML ~~LOC~~ SOLN
0.0000 [IU] | Freq: Three times a day (TID) | SUBCUTANEOUS | Status: DC
  Administered 2017-07-26: 2 [IU] via SUBCUTANEOUS

## 2017-07-25 MED ORDER — FUROSEMIDE 40 MG PO TABS
40.0000 mg | ORAL_TABLET | Freq: Two times a day (BID) | ORAL | Status: DC
  Administered 2017-07-25 – 2017-07-26 (×2): 40 mg via ORAL
  Filled 2017-07-25 (×2): qty 1

## 2017-07-25 MED ORDER — POTASSIUM CHLORIDE CRYS ER 20 MEQ PO TBCR: 40.0000 meq | EXTENDED_RELEASE_TABLET | Freq: Once | ORAL | Status: DC

## 2017-07-25 MED ORDER — POTASSIUM CHLORIDE CRYS ER 20 MEQ PO TBCR
40.0000 meq | EXTENDED_RELEASE_TABLET | ORAL | Status: AC
  Administered 2017-07-25 (×3): 40 meq via ORAL
  Filled 2017-07-25 (×3): qty 2

## 2017-07-25 NOTE — Care Management Note (Signed)
Case Management Note  Patient Details  Name: Ginnette Berardino MRN: [id number] Date of Birth: 10-27-1965  Subjective/Objective:      Admitted with acute respiratory failure, acute on chronic systolic CHF, and acute renal failure. PMH significant for asthma, DM2, HTN, nonischemic cardiomyopathy, CVA, and systolic CHF.                - intubated on 6/7, extubated on 6/10.    [address] (Sister) Larwance Rote (Son)      [phone]          PCP: Dr.Deborah Laural Benes  Action/Plan: Transition to home when medically stable.Post hospital f/u appointment scheduled for 07/31/2017 @ 11am, [facility].  Expected Discharge Date:   07/26/2017   Expected Discharge Plan:  Home/Self Care  In-House Referral:     Discharge planning Services  CM Consult  Post Acute Care Choice:    Choice offered to:     DME Arranged:    N/A DME Agency:   N/A  HH Arranged:   N/A HH Agency:   N/A  Status of Service:  In process, will continue to follow  If discussed at Long Length of Stay Meetings, dates discussed:    Additional Comments:  Epifanio Lesches, RN 07/25/2017, 4:02 PM

## 2017-07-25 NOTE — Progress Notes (Signed)
Facility is currently out of rectal tubes.  Unsure when will be available. Dr Jomarie Longs aware.

## 2017-07-25 NOTE — Progress Notes (Signed)
Patient Demographics:    [name], is a 52 y.o. female, DOB - March 05, 1965, DIY:[id number]  Admit date - 07/19/2017   Admitting Physician Sampson Goon, MD  Outpatient Primary MD for the patient is Ladell Pier, MD  LOS - 6   Chief Complaint  Patient presents with  . Shortness of Breath        Subjective:    [name] today has no fevers, no emesis,  No chest pain, patient states orthopnea and dyspnea on exertion is better, she continues to void well  Assessment  & Plan :    Active Problems:   Respiratory failure (HCC)   Acute pulmonary edema [facility])   Physical deconditioning  Brief Summary:- 52 yo female with PMH of DM-2, HTN, NICM, CHF, stroke, asthma who was admitted on 07/19/17 with acute respiratory failure, bilateral infiltrates, pulmonary edema, acute on chronic CHF, acute renal failure. Required intubation on 07/19/2017 on admission, extubated 07/22/2017  Plan:-  1)HFrEF--improving orthopnea, improving dyspnea on exertion , admited with acute on chronic systolic dysfunction CHF in the setting of nonischemic cardiomyopathy, shortness of breath at rest is better, dyspnea on exertion persist, last known EF 25% to 30 % (down from 30 to 35% in August 2018), left heart cath from 05/2011 without obstructive CAD, patient admits to noncompliance with Lasix 40 mg p.o. twice daily at home, PTA she was apparently taking Lasix once a day and not even every day at that, patient is done well with IV Lasix, may switch to p.o. Lasix 40 mg twice daily at this time, accurate fluid intake and input monitoring, daily weights, continue Coreg 6.25 mg twice daily.  After discharge patient will need to follow-up with Dr. Rayann Heman again to further discuss possible AICD placement.  Fluid balance is negative about 3 L, weight is down about 6 pounds  2)Acute Hypoxic Respiratory failure secondary to #1 above, required  intubation on 07/19/2017, extubated on 07/22/2017  3)Possible HCAP-respiratory symptoms continue to improve clinically, clinically and radiologically respiratory symptoms appear more likely to be secondary to CHF exacerbation as noted above, however patient does have fever (T max 100.4 on 07/23/17) rather than frank pneumonia, vancomycin was started on 07/19/2017 and discontinued on 07/23/2017, Zosyn started on 07/19/2017, will continue for now, continue Mucinex, bronchodilators.  No further hypoxia, procalcitonin is trending down  CULTURES: Blood culture x2 6/7>> Sputum culture 6/7>>negative Respiratory panel PCR 6/7>>negative Strep Pneumo Urinary antigen 6/7>>negative  4)DM-uncontrolled with A1c of 10.0, continue Lantus insulin 12 units nightly, Use Novolog/Humalog Sliding scale insulin with Accu-Cheks/Fingersticks as ordered   5)AKI----acute kidney injury-  creatinine on admission= 0.93  ,   baseline creatinine = 0.9   , creatinine is now= 1.4, Avoid nephrotoxic agents / dehydration /  hypotension   Code Status : Full    Disposition Plan  : home  Consults  :  PCCM   DVT Prophylaxis  :    Heparin    Lab Results  Component Value Date   PLT 319 07/25/2017    Inpatient Medications  Scheduled Meds: . carvedilol  6.25 mg Oral BID WC  . chlorhexidine gluconate (MEDLINE KIT)  15 mL Mouth Rinse BID  . Chlorhexidine Gluconate Cloth  6 each Topical Daily  . furosemide  40 mg Oral BID  . guaiFENesin  600 mg Oral BID  . heparin injection (subcutaneous)  5,000 Units Subcutaneous Q8H  . insulin aspart  0-15 Units Subcutaneous Q4H  . insulin glargine  12 Units Subcutaneous QHS  . mouth rinse  15 mL Mouth Rinse q12n4p  . potassium chloride  40 mEq Oral Q4H  . protein supplement shake  11 oz Oral BID BM  . sodium chloride flush  10-40 mL Intracatheter Q12H  . sodium chloride flush  10-40 mL Intracatheter Q12H   Continuous Infusions: . sodium chloride    . piperacillin-tazobactam (ZOSYN)  IV  3.375 g (07/25/17 1122)   PRN Meds:.sodium chloride, acetaminophen (TYLENOL) oral liquid 160 mg/5 mL, guaiFENesin-dextromethorphan, ipratropium-albuterol, ondansetron (ZOFRAN) IV, sodium chloride flush, sodium chloride flush    Anti-infectives (From admission, onward)   Start     Dose/Rate Route Frequency Ordered Stop   07/20/17 1800  vancomycin (VANCOCIN) IVPB 750 mg/150 ml premix  Status:  Discontinued     750 mg 150 mL/hr over 60 Minutes Intravenous Every 12 hours 07/20/17 0931 07/23/17 1135   07/19/17 2200  vancomycin (VANCOCIN) IVPB 1000 mg/200 mL premix  Status:  Discontinued     1,000 mg 200 mL/hr over 60 Minutes Intravenous Every 8 hours 07/19/17 1449 07/20/17 0931   07/19/17 1800  piperacillin-tazobactam (ZOSYN) IVPB 3.375 g     3.375 g 12.5 mL/hr over 240 Minutes Intravenous Every 8 hours 07/19/17 1449     07/19/17 1145  piperacillin-tazobactam (ZOSYN) IVPB 3.375 g     3.375 g 100 mL/hr over 30 Minutes Intravenous  Once 07/19/17 1140 07/19/17 1245   07/19/17 1145  vancomycin (VANCOCIN) 2,000 mg in sodium chloride 0.9 % 500 mL IVPB     2,000 mg 250 mL/hr over 120 Minutes Intravenous  Once 07/19/17 1142 07/19/17 1501        Objective:   Vitals:   07/25/17 0800 07/25/17 1149 07/25/17 1323 07/25/17 1525  BP: (!) 149/80 134/78 (!) 148/85 132/83  Pulse:  81 82 86  Resp: _0 Temp: 98 F (36.7 C) 98.5 F (36.9 C) 97.9 F (36.6 C) 98.3 F (36.8 C)  TempSrc: Oral Oral Oral Oral  SpO2: 98% 99% 99% 99%  Weight:      Height:        Wt Readings from Last 3 Encounters:  07/25/17 96 kg (211 lb 10.3 oz)  06/01/17 98.4 kg (216 lb 14.4 oz)  04/09/17 98 kg (216 lb)     Intake/Output Summary (Last 24 hours) at 07/25/2017 1605 Last data filed at 07/25/2017 1342 Gross per 24 hour  Intake 840 ml  Output 3750 ml  Net -2910 ml     Physical Exam  Gen:- Awake Alert,  In no apparent distress  HEENT:- [place].AT, No sclera icterus Neck-Supple Neck,No JVD,.    Lungs-improving air movement bilaterally, with faint rales CV- S1, S2 normal Abd-  +ve B.Sounds, Abd Soft, No tenderness,    Extremity/Skin:-Trace edema, good pulses  psych-affect is appropriate, oriented x3 Neuro-no new focal deficits, no tremors   Data Review:   Micro Results Recent Results (from the past 240 hour(s))  Blood culture (routine x 2)     Status: None   Collection Time: 07/19/17 12:05 PM  Result Value Ref Range Status   Specimen Description BLOOD RIGHT FOREARM  Final   Special Requests   Final    BOTTLES DRAWN AEROBIC AND ANAEROBIC Blood Culture adequate volume   Culture  Final    NO GROWTH 5 DAYS Performed at [facility], [place] [address] [place], [place] [address]    Report Status 07/24/2017 FINAL  Final  Respiratory Panel by PCR     Status: None   Collection Time: 07/19/17  1:29 PM  Result Value Ref Range Status   Adenovirus NOT DETECTED NOT DETECTED Final   Coronavirus 229E NOT DETECTED NOT DETECTED Final   Coronavirus HKU1 NOT DETECTED NOT DETECTED Final   Coronavirus NL63 NOT DETECTED NOT DETECTED Final   Coronavirus OC43 NOT DETECTED NOT DETECTED Final   Metapneumovirus NOT DETECTED NOT DETECTED Final   Rhinovirus / Enterovirus NOT DETECTED NOT DETECTED Final   Influenza A NOT DETECTED NOT DETECTED Final   Influenza B NOT DETECTED NOT DETECTED Final   Parainfluenza Virus 1 NOT DETECTED NOT DETECTED Final   Parainfluenza Virus 2 NOT DETECTED NOT DETECTED Final   Parainfluenza Virus 3 NOT DETECTED NOT DETECTED Final   Parainfluenza Virus 4 NOT DETECTED NOT DETECTED Final   Respiratory Syncytial Virus NOT DETECTED NOT DETECTED Final   Bordetella pertussis NOT DETECTED NOT DETECTED Final   Chlamydophila pneumoniae NOT DETECTED NOT DETECTED Final   Mycoplasma pneumoniae NOT DETECTED NOT DETECTED Final    Comment: Performed at [facility], [place] [address] [place], [place] [address]  Blood culture (routine x 2)     Status: None   Collection  Time: 07/19/17  1:45 PM  Result Value Ref Range Status   Specimen Description BLOOD CENTRAL LINE  Final   Special Requests   Final    BOTTLES DRAWN AEROBIC AND ANAEROBIC Blood Culture adequate volume   Culture   Final    NO GROWTH 5 DAYS Performed at [facility], [address], [place], [place] [address]    Report Status 07/24/2017 FINAL  Final  MRSA PCR Screening     Status: None   Collection Time: 07/19/17  3:06 PM  Result Value Ref Range Status   MRSA by PCR NEGATIVE NEGATIVE Final    Comment:        The GeneXpert MRSA Assay (FDA approved for NASAL specimens only), is one component of a comprehensive MRSA colonization surveillance program. It is not intended to diagnose MRSA infection nor to guide or monitor treatment for MRSA infections. Performed at [facility], [place] [address], [place], [place] [address]   Culture, respiratory (NON-Expectorated)     Status: None   Collection Time: 07/19/17  5:45 PM  Result Value Ref Range Status   Specimen Description TRACHEAL ASPIRATE  Final   Special Requests Normal  Final   Gram Stain   Final    FEW WBC PRESENT, PREDOMINANTLY PMN NO ORGANISMS SEEN    Culture   Final    NO GROWTH 3 DAYS Performed at [facility], [place] [address], [place], [place] [address]    Report Status 07/22/2017 FINAL  Final    Radiology Reports Dg Chest Port 1 View  Result Date: 07/24/2017 CLINICAL DATA:  Shortness of Breath EXAM: PORTABLE CHEST 1 VIEW COMPARISON:  07/23/2017 FINDINGS: Cardiomegaly with vascular congestion. No overt edema. No confluent opacities or effusions. No acute bony abnormality. IMPRESSION: Cardiomegaly, vascular congestion. Electronically Signed   By: Rolm Baptise M.D.   On: 07/24/2017 08:58   Dg Chest Port 1 View  Result Date: 07/23/2017 CLINICAL DATA:  Acute respiratory failure EXAM: PORTABLE CHEST 1 VIEW COMPARISON:  07/22/2017 FINDINGS: Cardiac shadow is stable. Endotracheal  tube and nasogastric catheter  have been removed in the interval. Right jugular central line is again seen. Improved aeration is noted in the bases bilaterally with resolution of previously seen bibasilar infiltrates. No pneumothorax is noted. IMPRESSION: Significant improved aeration bilaterally. Electronically Signed   By: Inez Catalina M.D.   On: 07/23/2017 08:50   Dg Chest Port 1 View  Result Date: 07/22/2017 CLINICAL DATA:  Acute respiratory failure EXAM: PORTABLE CHEST 1 VIEW COMPARISON:  Portable chest x-ray of 07/21/2017 FINDINGS: The tip of the endotracheal tube is approximately 5.2 cm above the carina. Airspace disease has improved somewhat although opacities remain at both lung bases left-greater-than-right. Cardiomegaly is stable. Right central venous line tip overlies the lower SVC. NG tube extends below the hemidiaphragm. IMPRESSION: 1. Slight improvement in airspace disease with persistent opacities at both lung bases. 2. Endotracheal tube tip 5.2 cm above the carina. Electronically Signed   By: Ivar Drape M.D.   On: 07/22/2017 08:37   Dg Chest Port 1 View  Result Date: 07/21/2017 CLINICAL DATA:  Acute respiratory failure EXAM: PORTABLE CHEST 1 VIEW COMPARISON:  07/19/2017 chest radiograph. FINDINGS: Endotracheal tube tip is 2.7 cm above the carina. Enteric tube enters stomach with the tip not seen on this image. Right internal jugular central venous catheter terminates over the lower third of the SVC. Stable cardiomediastinal silhouette with mild cardiomegaly. No pneumothorax. No pleural effusion. Mild hazy parahilar opacities, substantially decreased. IMPRESSION: 1. Well-positioned support structures as detailed. 2. Stable cardiomegaly. Mild hazy parahilar opacities, substantially decreased, favoring mild residual improving pulmonary edema. Electronically Signed   By: Ilona Sorrel M.D.   On: 07/21/2017 08:49   Dg Chest Portable 1 View  Result Date: 07/19/2017 CLINICAL DATA:  Status post central line placement EXAM:  PORTABLE CHEST 1 VIEW COMPARISON:  July 19, 2017 12:19 p.m. FINDINGS: The heart size and mediastinal contours are stable. Right central venous line is identified with distal tip probably in the superior vena cava. Endotracheal tube is seen in good position with distal tip 5 cm from carina. Nasogastric tube is identified with distal tip not included on film but is at least in the stomach. Diffuse consolidation of the right lung and patchy consolidation of the left upper and mid lung are worse compared prior exam. The visualized skeletal structures are stable. IMPRESSION: Right central venous line distal tip is in the superior vena cava. No definite pneumothorax is identified. Pulmonary edema versus bilateral pneumonia are worse compared to prior earlier exam. Electronically Signed   By: Abelardo Diesel M.D.   On: 07/19/2017 14:09   Dg Chest Portable 1 View  Result Date: 07/19/2017 CLINICAL DATA:  52 year old female with shortness of breath this morning, pink frothy sputum. Possible congestive heart failure, intubated. EXAM: PORTABLE CHEST 1 VIEW COMPARISON:  1125 hours today and earlier. FINDINGS: Portable AP semi upright view at 1219 hours. Endotracheal tube tip in good position at the level the clavicles. Mildly larger lung volumes. Stable cardiomegaly and mediastinal contours. Confluent and indistinct right greater than left pulmonary opacity with air bronchograms at the right lung base, not significantly changed. No superimposed pneumothorax. No pleural effusion is evident. Paucity bowel gas in the upper abdomen. No acute osseous abnormality identified. IMPRESSION: 1. ET tube in good position at the level the clavicles. 2. Mildly larger lung volumes but otherwise stable chest with right greater than left airspace disease. Top differential considerations are bilateral pneumonia and asymmetric pulmonary edema. Electronically Signed   By: Herminio Heads.D.  On: 07/19/2017 12:34   Dg Chest Portable 1 View  Result  Date: 07/19/2017 CLINICAL DATA:  Severe shortness of breath. EXAM: PORTABLE CHEST 1 VIEW COMPARISON:  Chest CT and chest x-ray dated 05/31/2017 FINDINGS: There are extensive patchy areas of consolidative infiltrate in the right lung with small patchy areas of infiltrate in the left mid and lower lung zone. Heart size is within normal limits. Pulmonary vascularity appears normal. No discrete effusions. No bone abnormality. IMPRESSION: Bilateral pulmonary infiltrates, much worse on the right than the left, similar to the appearance on the prior CT scan. This probably represents multifocal pneumonia. Electronically Signed   By: Lorriane Shire M.D.   On: 07/19/2017 11:48     CBC Recent Labs  Lab 07/21/17 0337 07/22/17 0512 07/23/17 0500 07/24/17 0714 07/25/17 0837  WBC 6.1 8.4 8.1 8.8 8.9  HGB 8.3* 8.6* 8.7* 9.0* 9.8*  HCT 27.7* 28.3* 28.7* 29.7* 32.0*  PLT 243 283 322 285 319  MCV 88.5 87.9 88.3 88.4 86.3  MCH 26.5 26.7 26.8 26.8 26.4  MCHC 30.0 30.4 30.3 30.3 30.6  RDW 14.6 14.5 14.5 14.5 14.4    Chemistries  Recent Labs  Lab 07/20/17 0322 07/21/17 0337 07/22/17 0512 07/23/17 0500 07/24/17 0714 07/25/17 0837  NA 138 142 144 144 142 141  K 3.7 3.4* 4.1 3.6 3.1* 2.8*  CL 109 113* 110 110 108 101  CO2 _0 GLUCOSE 163* 78 112* 134* 115* 128*  BUN _1 CREATININE 1.28* 1.27* 1.34* 1.40* 1.32* 1.44*  CALCIUM 7.5* 7.8* 8.6* 8.5* 8.7* 8.7*  MG 1.3* 1.6* 2.0  --   --   --    ------------------------------------------------------------------------------------------------------------------ No results for input(s): CHOL, HDL, LDLCALC, TRIG, CHOLHDL, LDLDIRECT in the last 72 hours.  Lab Results  Component Value Date   HGBA1C 10.0 (H) 07/19/2017   ------------------------------------------------------------------------------------------------------------------ No results for input(s): TSH, T4TOTAL, T3FREE, THYROIDAB in the last 72 hours.  Invalid  input(s): FREET3 ------------------------------------------------------------------------------------------------------------------ No results for input(s): VITAMINB12, FOLATE, FERRITIN, TIBC, IRON, RETICCTPCT in the last 72 hours.  Coagulation profile Recent Labs  Lab 07/19/17 1700  INR 1.21    No results for input(s): DDIMER in the last 72 hours.  Cardiac Enzymes No results for input(s): CKMB, TROPONINI, MYOGLOBIN in the last 168 hours.  Invalid input(s): CK ------------------------------------------------------------------------------------------------------------------    Component Value Date/Time   BNP 582.5 (H) 07/19/2017 1137   BNP 470.7 (H) 05/05/2015 1645     Courage Emokpae M.D on 07/25/2017 at 4:05 PM  Between 7am to 7pm - Pager - [phone]  After 7pm go to [email or web address] - password [email or web address]  [facility] -  Office  [phone]   Voice Recognition Viviann Spare dictation system was used to create this note, attempts have been made to correct errors. Please contact the author with questions and/or clarifications.

## 2017-07-25 NOTE — Progress Notes (Signed)
Physical Therapy Treatment Patient Details Name: [name] MRN: [id number] DOB: 06/25/1965 Today's Date: 07/25/2017    History of Present Illness Patient is a 52 y/o female presenting to the ED on 07/19/17 due to dyspnea with requests to be intubated. Patient admitted with acute respiratory failure, acute on chronic systolic CHF, and acute renal failure - intubated on 6/7, extubated on 6/10. Patient with a PMH significant for asthma, DM2, HTN, nonischemic cardiomyopathy, CVA, and systolic CHF.     PT Comments    Pt showing increased tolerance for ambulation this session and improved balance. She ambulated 250 ft with supervision for safety and no AD. Pt completed stair training with good technique. No DOE noted throughout session. Current d/c plan remains appropriate.     Follow Up Recommendations  No PT follow up     Equipment Recommendations  None recommended by PT    Recommendations for Other Services       Precautions / Restrictions Precautions Precautions: Fall Restrictions Weight Bearing Restrictions: No    Mobility  Bed Mobility Overal bed mobility: Modified Independent             General bed mobility comments: Up walking in room on arrival  Transfers Overall transfer level: Modified independent                  Ambulation/Gait Ambulation/Gait assistance: Supervision Gait Distance (Feet): 250 Feet Assistive device: None Gait Pattern/deviations: Step-through pattern;Decreased stride length Gait velocity: decreased   General Gait Details: Pt with modest instability with gait, no LOB noted and no assist required.    Stairs Stairs: Yes Stairs assistance: Supervision Stair Management: One rail Right;One rail Left;Alternating pattern;Forwards Number of Stairs: 10 General stair comments: Pt negotiated steps with 1 rail for support Supervision for safety. Reciprocal pattern   Wheelchair Mobility    Modified Rankin (Stroke Patients Only)        Balance Overall balance assessment: Mild deficits observed, not formally tested                                          Cognition Arousal/Alertness: Awake/alert Behavior During Therapy: WFL for tasks assessed/performed Overall Cognitive Status: Within Functional Limits for tasks assessed                                        Exercises      General Comments        Pertinent Vitals/Pain Pain Assessment: No/denies pain    Home Living                      Prior Function            PT Goals (current goals can now be found in the care plan section) Acute Rehab PT Goals Patient Stated Goal: return home PT Goal Formulation: With patient Time For Goal Achievement: 07/31/17 Potential to Achieve Goals: Good Progress towards PT goals: Progressing toward goals    Frequency    Min 3X/week      PT Plan Current plan remains appropriate    Co-evaluation              AM-PAC PT "6 Clicks" Daily Activity  Outcome Measure  Difficulty turning over in bed (including adjusting bedclothes, sheets and blankets)?: None Difficulty  moving from lying on back to sitting on the side of the bed? : None Difficulty sitting down on and standing up from a chair with arms (e.g., wheelchair, bedside commode, etc,.)?: None Help needed moving to and from a bed to chair (including a wheelchair)?: None Help needed walking in hospital room?: A Little Help needed climbing 3-5 steps with a railing? : A Little 6 Click Score: 22    End of Session Equipment Utilized During Treatment: Gait belt Activity Tolerance: Patient tolerated treatment well Patient left: in chair;with call bell/phone within reach Nurse Communication: Mobility status PT Visit Diagnosis: Unsteadiness on feet (R26.81);Other abnormalities of gait and mobility (R26.89)     Time: 6811-5726 PT Time Calculation (min) (ACUTE ONLY): 9 min  Charges:  $Gait Training: 8-22  mins                    G Codes:       Kallie Locks, [place] Pager [phone] Acute Rehab   Sheral Apley 07/25/2017, 10:27 AM

## 2017-07-25 NOTE — Progress Notes (Signed)
Patient refuse TED hose and stated that they are for her to take home. SCD's are in place.

## 2017-07-26 LAB — BASIC METABOLIC PANEL
ANION GAP: 9 (ref 5–15)
BUN: 10 mg/dL (ref 6–20)
CALCIUM: 8.5 mg/dL — AB (ref 8.9–10.3)
CO2: 27 mmol/L (ref 22–32)
Chloride: 103 mmol/L (ref 101–111)
Creatinine, Ser: 1.37 mg/dL — ABNORMAL HIGH (ref 0.44–1.00)
GFR, EST AFRICAN AMERICAN: 51 mL/min — AB (ref >60)
GFR, EST NON AFRICAN AMERICAN: 44 mL/min — AB (ref >60)
Glucose, Bld: 142 mg/dL — ABNORMAL HIGH (ref 65–99)
POTASSIUM: 3 mmol/L — AB (ref 3.5–5.1)
SODIUM: 139 mmol/L (ref 135–145)

## 2017-07-26 LAB — GLUCOSE, CAPILLARY
GLUCOSE-CAPILLARY: 145 mg/dL — AB (ref 65–99)
Glucose-Capillary: 147 mg/dL — ABNORMAL HIGH (ref 65–99)

## 2017-07-26 MED ORDER — HYDRALAZINE HCL 25 MG PO TABS: 25.0000 mg | ORAL_TABLET | Freq: Three times a day (TID) | ORAL | 4 refills | Status: DC

## 2017-07-26 MED ORDER — INSULIN GLARGINE 100 UNIT/ML SOLOSTAR PEN: 15.0000 [IU] | PEN_INJECTOR | Freq: Every day | SUBCUTANEOUS | 3 refills | Status: DC

## 2017-07-26 MED ORDER — POTASSIUM CHLORIDE CRYS ER 20 MEQ PO TBCR
40.0000 meq | EXTENDED_RELEASE_TABLET | Freq: Once | ORAL | Status: AC
  Administered 2017-07-26: 40 meq via ORAL
  Filled 2017-07-26: qty 2

## 2017-07-26 MED ORDER — ASPIRIN 81 MG PO CHEW: 81.0000 mg | CHEWABLE_TABLET | Freq: Every day | ORAL | 3 refills | Status: AC

## 2017-07-26 MED ORDER — FUROSEMIDE 40 MG PO TABS: 40.0000 mg | ORAL_TABLET | Freq: Two times a day (BID) | ORAL | 11 refills | Status: DC

## 2017-07-26 MED ORDER — GABAPENTIN 300 MG PO CAPS
300.0000 mg | ORAL_CAPSULE | Freq: Every day | ORAL | 3 refills | Status: DC
Start: 2017-07-26 — End: 2017-07-31

## 2017-07-26 MED ORDER — ISOSORBIDE MONONITRATE ER 30 MG PO TB24: 30.0000 mg | ORAL_TABLET | Freq: Every day | ORAL | 11 refills | Status: DC

## 2017-07-26 MED ORDER — BLOOD GLUCOSE METER KIT: PACK | 0 refills | Status: DC

## 2017-07-26 MED ORDER — BLOOD GLUCOSE METER KIT: PACK | 3 refills | Status: DC

## 2017-07-26 MED ORDER — POTASSIUM CHLORIDE ER 20 MEQ PO TBCR: 20.0000 meq | EXTENDED_RELEASE_TABLET | Freq: Every day | ORAL | 1 refills | Status: DC

## 2017-07-26 MED ORDER — METFORMIN HCL ER (MOD) 1000 MG PO TB24: 1000.0000 mg | ORAL_TABLET | Freq: Two times a day (BID) | ORAL | 3 refills | Status: DC

## 2017-07-26 MED ORDER — ALBUTEROL SULFATE HFA 108 (90 BASE) MCG/ACT IN AERS: 2.0000 | INHALATION_SPRAY | Freq: Four times a day (QID) | RESPIRATORY_TRACT | 1 refills | Status: DC | PRN

## 2017-07-26 MED ORDER — ATORVASTATIN CALCIUM 80 MG PO TABS: 40.0000 mg | ORAL_TABLET | Freq: Every evening | ORAL | 11 refills | Status: DC

## 2017-07-26 MED ORDER — POTASSIUM CHLORIDE CRYS ER 20 MEQ PO TBCR: 40.0000 meq | EXTENDED_RELEASE_TABLET | Freq: Once | ORAL | Status: DC

## 2017-07-26 MED ORDER — CARVEDILOL 6.25 MG PO TABS: 6.2500 mg | ORAL_TABLET | Freq: Two times a day (BID) | ORAL | 11 refills | Status: DC

## 2017-07-26 NOTE — Discharge Instructions (Signed)
1)Very low-salt diet advised 2)Weigh yourself daily, call if you gain more than 3 pounds in 1 day or more than 5 pounds in 1 week as your diuretic medications may need to be adjusted 3)Limit your Fluid  intake to no more than 60 ounces (1.8 Liters) per day 4) take medications as prescribed 5)Please Follow- up with Dr. Olga Millers the cardiologist as scheduled on Tuesday 07/30/17--- to discuss further management of a heart failure, he would need to talk to EP/electrophysiology cardiology about possible AICD (defibrillator) device placement 6) very judicious with alcohol use, avoid alcohol as much as possible 7) repeat BMP/kidney and electrolyte test when you see Dr. Jens Som on 07/30/2017, you may need additional potassium supplementation 8)Avoid ibuprofen/Advil/Aleve/Motrin/Goody Powders/Naproxen/BC powders/Meloxicam/Diclofenac/Indomethacin and other Nonsteroidal anti-inflammatory medications as these will make you more likely to bleed and can cause stomach ulcers, can also cause Kidney problems.

## 2017-07-26 NOTE — Discharge Summary (Signed)
[name], is a 52 y.o. female  DOB September 29, 1965  MRN [id number].  Admission date:  07/19/2017  Admitting Physician  Sampson Goon, MD  Discharge Date:  07/26/2017   Primary MD  Ladell Pier, MD  Recommendations for primary care physician for things to follow:   1)Very low-salt diet advised 2)Weigh yourself daily, call if you gain more than 3 pounds in 1 day or more than 5 pounds in 1 week as your diuretic medications may need to be adjusted 3)Limit your Fluid  intake to no more than 60 ounces (1.8 Liters) per day 4) take medications as prescribed 5)Please Follow- up with Dr. Kirk Ruths the cardiologist as scheduled on Tuesday 07/30/17--- to discuss further management of a heart failure, he would need to talk to EP/electrophysiology cardiology about possible AICD (defibrillator) device placement 6) very judicious with alcohol use, avoid alcohol as much as possible 7) repeat BMP/kidney and electrolyte test when you see Dr. Stanford Breed on 07/30/2017, you may need additional potassium supplementation 8)Avoid ibuprofen/Advil/Aleve/Motrin/Goody Powders/Naproxen/BC powders/Meloxicam/Diclofenac/Indomethacin and other Nonsteroidal anti-inflammatory medications as these will make you more likely to bleed and can cause stomach ulcers, can also cause Kidney problems.     Admission Diagnosis  Acute respiratory failure with hypoxia (HCC) [J96.01] HCAP (healthcare-associated pneumonia) [J18.9]   Discharge Diagnosis  Acute respiratory failure with hypoxia ([place]) [J96.01] HCAP (healthcare-associated pneumonia) [J18.9]    Active Problems:   Respiratory failure ([place])   Acute pulmonary edema (HCC)   Physical deconditioning      Past Medical History:  Diagnosis Date  . Asthma 05/31/2017  . Diabetes mellitus, type 2 (HCC)    A1C 7.6 April '13  . Hypertension   . Left leg pain 07/21/2015  . Nonischemic  cardiomyopathy ([place])    Echo 05/19/11 EF 35% w/ mild-mod MR; R/L Heart Cath 05/21/11 - mildly elevated right heart pressures, normal left heart pressures, preserved cardiac output, widely patent coronary arteries without significant CAD and moderate global systolic LV dysfunction, EF 35-40%.  . 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 Systolic CHF ([place])    Echo 05/19/11 EF 35% w/ mild-mod MR    Past Surgical History:  Procedure Laterality Date  . CERVIX LESION DESTRUCTION    . DILATION AND CURETTAGE OF UTERUS    . LEFT HEART CATHETERIZATION WITH CORONARY ANGIOGRAM N/A 05/21/2011   Procedure: LEFT HEART CATHETERIZATION WITH CORONARY ANGIOGRAM;  Surgeon: Sherren Mocha, MD;  Location: [facility] CATH LAB;  Service: Cardiovascular;  Laterality: N/A;  . TUBAL LIGATION  1996       HPI  from the history and physical done on the day of admission:    CHIEF COMPLAINT: Extreme dyspnea.  HISTORY OF PRESENT ILLNESS:        This is a 52 year old who was admitted to this hospital from 4/19 two 4/21 For dyspnea.  She was treated empirically for CAP with a combination of azithromycin and Rocephin which was switched to [facility] on discharge.  Apparently her dyspnea returned  to baseline until early this morning.  The patient also has a known history of congestive heart failure and her last echocardiogram on 06/01/2017 showed an ejection fraction between 25 and 30%.  This morning apparently she became acutely dyspneic.  Her sister is not aware of any known history of fevers chills sweats or cough in the previous 24 hours.  It is the patient's birthday and she is not aware of the patient having done anything peculiar for that event.  There is no known history of chest pain.  She presented to the emergency room in respiratory extremis with a cough productive of very thin rust colored or bloody sputum.  She was so dyspneic that she requested to be intubated.  The patient is intubated and sedated  at the time of my exam.    Hospital Course:     Brief Summary:- 52 yo female with PMH of DM-2, HTN, NICM, CHF, stroke, asthma who was admitted on 07/19/17 with acute respiratory failure, bilateral infiltrates, pulmonary edema, acute on chronic CHF, acute renal failure. Required intubation on 07/19/2017 on admission, extubated 07/22/2017  Plan:-  1)HFrEF--orthopnea and dyspnea on exertion is resolved, patient was admited with acute on chronic systolic dysfunction CHF in the setting of nonischemic cardiomyopathy, last known EF 25% to 30 % (down from 30 to 35% in August 2018), left heart cath from 05/2011 without obstructive CAD, patient admits to noncompliance with Lasix 40 mg p.o. twice daily at home, PTA she was apparently taking Lasix once a day and not even every day at that,  patient was diuresed with IV Lasix, and transition to p.o. Lasix, renal function remains okay.  Fluid balance is negative , weight is down about 8 pounds (from 217lb to 209 lb), continue Coreg 6.25 mg twice daily, give isosorbide/hydralazine in Lieu of ACEI/ARB due to intolerance to ACE.   After discharge patient will need to follow-up with Dr Stanford Breed Dr. Rayann Heman again to further discuss possible AICD placement.  Fluid balance is negative , weight is down about 8 pounds (from 217lb to 209 lb), continue Coreg 6.25 mg twice daily, give isosorbide/hydralazine in Lieu of ACEI/ARB due to intolerance to ACE  2)Acute Hypoxic Respiratory failure secondary to #1 above, required intubation on 07/19/2017, extubated on 07/22/2017, ambulating without dyspnea on exertion, hypoxia on room air  3)Possible HCAP-respiratory symptoms continue to improve clinically, clinically and radiologically respiratory symptoms appear more likely to be secondary to CHF exacerbation as noted above, rather than frank pneumonia, vancomycin was started on 07/19/2017 and discontinued on 07/23/2017, Zosyn started on 07/19/2017,  , continue Mucinex, bronchodilators.  No  further hypoxia, procalcitonin is trending down.  No further antibiotics indicated at this time  CULTURES: Blood culture x2 6/7>> Sputum culture 6/7>>negative Respiratory panel PCR 6/7>>negative Strep Pneumo Urinary antigen 6/7>>negative  4)DM-uncontrolled with A1c of 10.0, discharged on Lantus 15 units nightly compliance advised ,    5)AKI----acute kidney injury-  creatinine on admission= 0.93  ,   baseline creatinine = 0.9   , creatinine is now= 1.3, Avoid nephrotoxic agents / dehydration /  hypotension, repeat BMP with cardiology on 07/30/2017   Code Status : Full   Disposition Plan  : home  Consults  :  PCCM  Discharge Condition: stable  Follow UP  Follow-up Information    [place]. Go on 07/31/2017.   Why:  11am, Dr. Ferdinand Lango information: [place] [id number] [phone]  Diet and Activity recommendation:  As advised  Discharge Instructions    Discharge Instructions    (HEART FAILURE PATIENTS) Call MD:  Anytime you have any of the following symptoms: 1) 3 pound weight gain in 24 hours or 5 pounds in 1 week 2) shortness of breath, with or without a dry hacking cough 3) swelling in the hands, feet or stomach 4) if you have to sleep on extra pillows at night in order to breathe.   Complete by:  As directed    Call MD for:  difficulty breathing, headache or visual disturbances   Complete by:  As directed    Call MD for:  persistant dizziness or light-headedness   Complete by:  As directed    Call MD for:  persistant nausea and vomiting   Complete by:  As directed    Call MD for:  temperature >100.4   Complete by:  As directed    Diet - low sodium heart healthy   Complete by:  As directed    Discharge instructions   Complete by:  As directed    1)Very low-salt diet advised 2)Weigh yourself daily, call if you gain more than 3 pounds in 1 day or more than 5 pounds in 1 week as  your diuretic medications may need to be adjusted 3)Limit your Fluid  intake to no more than 60 ounces (1.8 Liters) per day 4) take medications as prescribed 5)Please Follow- up with Dr. Kirk Ruths the cardiologist as scheduled on Tuesday 07/30/17--- to discuss further management of a heart failure, he would need to talk to EP/electrophysiology cardiology about possible AICD (defibrillator) device placement 6) very judicious with alcohol use, avoid alcohol as much as possible 7) repeat BMP/kidney and electrolyte test when you see Dr. Stanford Breed on 07/30/2017, you may need additional potassium supplementation 8)Avoid ibuprofen/Advil/Aleve/Motrin/Goody Powders/Naproxen/BC powders/Meloxicam/Diclofenac/Indomethacin and other Nonsteroidal anti-inflammatory medications as these will make you more likely to bleed and can cause stomach ulcers, can also cause Kidney problems.   Increase activity slowly   Complete by:  As directed         Discharge Medications     Allergies as of 07/26/2017      Reactions   Lisinopril Hives, Swelling   Facial    Sertraline Hives, Swelling   Facial    Tramadol Hives, Swelling   facial   Ibuprofen Hives, Swelling      Medication List    STOP taking these medications   azithromycin 500 MG tablet Commonly known as:  ZITHROMAX   glucose blood test strip Commonly known as:  TRUE METRIX BLOOD GLUCOSE TEST   guaiFENesin-dextromethorphan 100-10 MG/5ML syrup Commonly known as:  ROBITUSSIN DM   TRUEPLUS LANCETS 28G Misc   TRUEPLUS PEN NEEDLES 31G X 6 MM Misc Generic drug:  Insulin Pen Needle     TAKE these medications   albuterol 108 (90 Base) MCG/ACT inhaler Commonly known as:  PROVENTIL HFA;VENTOLIN HFA Inhale 2 puffs into the lungs every 6 (six) hours as needed for wheezing or shortness of breath.   aspirin 81 MG chewable tablet Chew 1 tablet (81 mg total) by mouth daily.   atorvastatin 80 MG tablet Commonly known as:  LIPITOR Take 0.5 tablets (40  mg total) by mouth every evening. What changed:  when to take this   blood glucose meter kit and supplies Dispense based on patient and insurance preference. Use up to four times daily as directed. (FOR ICD-9 250.00, 250.01). What changed:  Another medication with  the same name was changed. Make sure you understand how and when to take each.   blood glucose meter kit and supplies [employer] or Dispense other brand based on patient and insurance preference. Use up to four times daily as directed. (FOR ICD-9 250.00, 250.01). What changed:    medication strength  additional instructions   carvedilol 6.25 MG tablet Commonly known as:  COREG Take 1 tablet (6.25 mg total) by mouth 2 (two) times daily with a meal.   furosemide 40 MG tablet Commonly known as:  LASIX Take 1 tablet (40 mg total) by mouth 2 (two) times daily. For Fluid/Heart Failure What changed:  additional instructions   gabapentin 300 MG capsule Commonly known as:  NEURONTIN Take 1 capsule (300 mg total) by mouth at bedtime.   hydrALAZINE 25 MG tablet Commonly known as:  APRESOLINE Take 1 tablet (25 mg total) by mouth 3 (three) times daily. What changed:    medication strength  how much to take   Insulin Glargine 100 UNIT/ML Solostar Pen Commonly known as:  LANTUS SOLOSTAR Inject 15 Units into the skin at bedtime. What changed:    how much to take  when to take this  additional instructions   isosorbide mononitrate 30 MG 24 hr tablet Commonly known as:  IMDUR Take 1 tablet (30 mg total) by mouth daily.   metFORMIN 1000 MG (MOD) 24 hr tablet Commonly known as:  GLUCOPHAGE XR Take 1 tablet (1,000 mg total) by mouth 2 (two) times daily with a meal. What changed:  medication strength   Potassium Chloride ER 20 MEQ Tbcr Take 20 mEq by mouth daily. 1 tab daily by mouth       Major procedures and Radiology Reports - PLEASE review detailed and final reports for all details, in brief -     Dg Chest  Port 1 View  Result Date: 07/24/2017 CLINICAL DATA:  Shortness of Breath EXAM: PORTABLE CHEST 1 VIEW COMPARISON:  07/23/2017 FINDINGS: Cardiomegaly with vascular congestion. No overt edema. No confluent opacities or effusions. No acute bony abnormality. IMPRESSION: Cardiomegaly, vascular congestion. Electronically Signed   By: Rolm Baptise M.D.   On: 07/24/2017 08:58   Dg Chest Port 1 View  Result Date: 07/23/2017 CLINICAL DATA:  Acute respiratory failure EXAM: PORTABLE CHEST 1 VIEW COMPARISON:  07/22/2017 FINDINGS: Cardiac shadow is stable. Endotracheal tube and nasogastric catheter have been removed in the interval. Right jugular central line is again seen. Improved aeration is noted in the bases bilaterally with resolution of previously seen bibasilar infiltrates. No pneumothorax is noted. IMPRESSION: Significant improved aeration bilaterally. Electronically Signed   By: Inez Catalina M.D.   On: 07/23/2017 08:50   Dg Chest Port 1 View  Result Date: 07/22/2017 CLINICAL DATA:  Acute respiratory failure EXAM: PORTABLE CHEST 1 VIEW COMPARISON:  Portable chest x-ray of 07/21/2017 FINDINGS: The tip of the endotracheal tube is approximately 5.2 cm above the carina. Airspace disease has improved somewhat although opacities remain at both lung bases left-greater-than-right. Cardiomegaly is stable. Right central venous line tip overlies the lower SVC. NG tube extends below the hemidiaphragm. IMPRESSION: 1. Slight improvement in airspace disease with persistent opacities at both lung bases. 2. Endotracheal tube tip 5.2 cm above the carina. Electronically Signed   By: Ivar Drape M.D.   On: 07/22/2017 08:37   Dg Chest Port 1 View  Result Date: 07/21/2017 CLINICAL DATA:  Acute respiratory failure EXAM: PORTABLE CHEST 1 VIEW COMPARISON:  07/19/2017 chest radiograph. FINDINGS: Endotracheal tube  tip is 2.7 cm above the carina. Enteric tube enters stomach with the tip not seen on this image. Right internal jugular  central venous catheter terminates over the lower third of the SVC. Stable cardiomediastinal silhouette with mild cardiomegaly. No pneumothorax. No pleural effusion. Mild hazy parahilar opacities, substantially decreased. IMPRESSION: 1. Well-positioned support structures as detailed. 2. Stable cardiomegaly. Mild hazy parahilar opacities, substantially decreased, favoring mild residual improving pulmonary edema. Electronically Signed   By: Ilona Sorrel M.D.   On: 07/21/2017 08:49   Dg Chest Portable 1 View  Result Date: 07/19/2017 CLINICAL DATA:  Status post central line placement EXAM: PORTABLE CHEST 1 VIEW COMPARISON:  July 19, 2017 12:19 p.m. FINDINGS: The heart size and mediastinal contours are stable. Right central venous line is identified with distal tip probably in the superior vena cava. Endotracheal tube is seen in good position with distal tip 5 cm from carina. Nasogastric tube is identified with distal tip not included on film but is at least in the stomach. Diffuse consolidation of the right lung and patchy consolidation of the left upper and mid lung are worse compared prior exam. The visualized skeletal structures are stable. IMPRESSION: Right central venous line distal tip is in the superior vena cava. No definite pneumothorax is identified. Pulmonary edema versus bilateral pneumonia are worse compared to prior earlier exam. Electronically Signed   By: Abelardo Diesel M.D.   On: 07/19/2017 14:09   Dg Chest Portable 1 View  Result Date: 07/19/2017 CLINICAL DATA:  52 year old female with shortness of breath this morning, pink frothy sputum. Possible congestive heart failure, intubated. EXAM: PORTABLE CHEST 1 VIEW COMPARISON:  1125 hours today and earlier. FINDINGS: Portable AP semi upright view at 1219 hours. Endotracheal tube tip in good position at the level the clavicles. Mildly larger lung volumes. Stable cardiomegaly and mediastinal contours. Confluent and indistinct right greater than left  pulmonary opacity with air bronchograms at the right lung base, not significantly changed. No superimposed pneumothorax. No pleural effusion is evident. Paucity bowel gas in the upper abdomen. No acute osseous abnormality identified. IMPRESSION: 1. ET tube in good position at the level the clavicles. 2. Mildly larger lung volumes but otherwise stable chest with right greater than left airspace disease. Top differential considerations are bilateral pneumonia and asymmetric pulmonary edema. Electronically Signed   By: Genevie Ann M.D.   On: 07/19/2017 12:34   Dg Chest Portable 1 View  Result Date: 07/19/2017 CLINICAL DATA:  Severe shortness of breath. EXAM: PORTABLE CHEST 1 VIEW COMPARISON:  Chest CT and chest x-ray dated 05/31/2017 FINDINGS: There are extensive patchy areas of consolidative infiltrate in the right lung with small patchy areas of infiltrate in the left mid and lower lung zone. Heart size is within normal limits. Pulmonary vascularity appears normal. No discrete effusions. No bone abnormality. IMPRESSION: Bilateral pulmonary infiltrates, much worse on the right than the left, similar to the appearance on the prior CT scan. This probably represents multifocal pneumonia. Electronically Signed   By: Lorriane Shire M.D.   On: 07/19/2017 11:48    Micro Results    Recent Results (from the past 240 hour(s))  Blood culture (routine x 2)     Status: None   Collection Time: 07/19/17 12:05 PM  Result Value Ref Range Status   Specimen Description BLOOD RIGHT FOREARM  Final   Special Requests   Final    BOTTLES DRAWN AEROBIC AND ANAEROBIC Blood Culture adequate volume   Culture   Final  NO GROWTH 5 DAYS Performed at [facility], [place] [address], [place], [place] [address]    Report Status 07/24/2017 FINAL  Final  Respiratory Panel by PCR     Status: None   Collection Time: 07/19/17  1:29 PM  Result Value Ref Range Status   Adenovirus NOT DETECTED NOT DETECTED Final   Coronavirus 229E  NOT DETECTED NOT DETECTED Final   Coronavirus HKU1 NOT DETECTED NOT DETECTED Final   Coronavirus NL63 NOT DETECTED NOT DETECTED Final   Coronavirus OC43 NOT DETECTED NOT DETECTED Final   Metapneumovirus NOT DETECTED NOT DETECTED Final   Rhinovirus / Enterovirus NOT DETECTED NOT DETECTED Final   Influenza A NOT DETECTED NOT DETECTED Final   Influenza B NOT DETECTED NOT DETECTED Final   Parainfluenza Virus 1 NOT DETECTED NOT DETECTED Final   Parainfluenza Virus 2 NOT DETECTED NOT DETECTED Final   Parainfluenza Virus 3 NOT DETECTED NOT DETECTED Final   Parainfluenza Virus 4 NOT DETECTED NOT DETECTED Final   Respiratory Syncytial Virus NOT DETECTED NOT DETECTED Final   Bordetella pertussis NOT DETECTED NOT DETECTED Final   Chlamydophila pneumoniae NOT DETECTED NOT DETECTED Final   Mycoplasma pneumoniae NOT DETECTED NOT DETECTED Final    Comment: Performed at [facility], [place] [address] [place], [place] [address]  Blood culture (routine x 2)     Status: None   Collection Time: 07/19/17  1:45 PM  Result Value Ref Range Status   Specimen Description BLOOD CENTRAL LINE  Final   Special Requests   Final    BOTTLES DRAWN AEROBIC AND ANAEROBIC Blood Culture adequate volume   Culture   Final    NO GROWTH 5 DAYS Performed at [facility], [address], [place], [place] [address]    Report Status 07/24/2017 FINAL  Final  MRSA PCR Screening     Status: None   Collection Time: 07/19/17  3:06 PM  Result Value Ref Range Status   MRSA by PCR NEGATIVE NEGATIVE Final    Comment:        The GeneXpert MRSA Assay (FDA approved for NASAL specimens only), is one component of a comprehensive MRSA colonization surveillance program. It is not intended to diagnose MRSA infection nor to guide or monitor treatment for MRSA infections. Performed at [facility], [place] [address], [place], [place] [address]   Culture, respiratory (NON-Expectorated)     Status: None   Collection Time:  07/19/17  5:45 PM  Result Value Ref Range Status   Specimen Description TRACHEAL ASPIRATE  Final   Special Requests Normal  Final   Gram Stain   Final    FEW WBC PRESENT, PREDOMINANTLY PMN NO ORGANISMS SEEN    Culture   Final    NO GROWTH 3 DAYS Performed at [facility], [place] [address], [place], [place] [address]    Report Status 07/22/2017 FINAL  Final       Today   Subjective    [name] today has no new concerns, ambulating without dyspnea on exertion, slept flat last night without orthopnea          Patient has been seen and examined prior to discharge   Objective   Blood pressure 121/72, pulse 79, temperature 98.2 F (36.8 C), temperature source Oral, resp. rate 18, height _0  (1.753 m), weight 95.1 kg (209 lb 9.6 oz), last menstrual period 07/15/2017, SpO2 99 %.   Intake/Output Summary (Last 24 hours) at  07/26/2017 0944 Last data filed at 07/26/2017 0818 Gross per 24 hour  Intake 600 ml  Output 1700 ml  Net -1100 ml    Exam Gen:- Awake Alert,  In no apparent distress  HEENT:- [place].AT, No sclera icterus Neck-Supple Neck,No JVD,.  Lungs-much improved aeration, no rales CV- S1, S2 normal Abd-  +ve B.Sounds, Abd Soft, No tenderness,    Extremity/Skin:-Resolved lower extremity edema, good pulses  psych-affect is appropriate, oriented x3 Neuro-no new focal deficits, no tremors     Data Review   CBC w Diff:  Lab Results  Component Value Date   WBC 8.9 07/25/2017   HGB 9.8 (L) 07/25/2017   HGB 14.5 02/21/2017   HCT 32.0 (L) 07/25/2017   HCT 45.1 02/21/2017   PLT 319 07/25/2017   PLT 350 02/21/2017   LYMPHOPCT 7 06/02/2017   MONOPCT 3 06/02/2017   EOSPCT 0 06/02/2017   BASOPCT 0 06/02/2017    CMP:  Lab Results  Component Value Date   NA 139 07/26/2017   NA 135 04/24/2017   K 3.0 (L) 07/26/2017   CL 103 07/26/2017   CO2 27 07/26/2017   BUN 10 07/26/2017   BUN 14 04/24/2017   CREATININE 1.37 (H) 07/26/2017   CREATININE 0.92  06/29/2016   PROT 6.8 06/01/2017   PROT 7.1 02/21/2017   ALBUMIN 3.1 (L) 06/01/2017   ALBUMIN 4.2 02/21/2017   BILITOT 1.9 (H) 06/01/2017   BILITOT 0.5 02/21/2017   ALKPHOS 77 06/01/2017   AST 21 06/01/2017   ALT 14 06/01/2017  .   Total Discharge time is about 33 minutes  Roxan Hockey M.D on 07/26/2017 at 9:44 AM  [facility]   Office  [phone]  Voice Recognition Viviann Spare dictation system was used to create this note, attempts have been made to correct errors. Please contact the author with questions and/or clarifications.

## 2017-07-26 NOTE — Progress Notes (Signed)
Pt in room with son and daughter, dressed and ready to receive discharge instructions. Pt understands instructions and where to pick up prescriptions. Pt declined to be taken downstairs in wheelchair and stated she wanted to walk down to her car, had driven herself to the ED.

## 2017-07-26 NOTE — Progress Notes (Signed)
Spoke RN Rayfield Citizen and educated her that she could remove midline

## 2017-07-30 ENCOUNTER — Ambulatory Visit: Payer: Medicaid Other | Admitting: Cardiology

## 2017-07-30 ENCOUNTER — Encounter: Payer: Self-pay | Admitting: Cardiology

## 2017-07-30 VITALS — BP 124/72 | HR 90 | Ht 69.0 in | Wt 211.0 lb

## 2017-07-30 DIAGNOSIS — I10 Essential (primary) hypertension: Secondary | ICD-10-CM | POA: Diagnosis not present

## 2017-07-30 DIAGNOSIS — I5023 Acute on chronic systolic (congestive) heart failure: Secondary | ICD-10-CM | POA: Diagnosis not present

## 2017-07-30 DIAGNOSIS — I340 Nonrheumatic mitral (valve) insufficiency: Secondary | ICD-10-CM

## 2017-07-30 DIAGNOSIS — I428 Other cardiomyopathies: Secondary | ICD-10-CM

## 2017-07-30 MED ORDER — SPIRONOLACTONE 25 MG PO TABS: 25.0000 mg | ORAL_TABLET | Freq: Every day | ORAL | 3 refills | Status: DC

## 2017-07-30 NOTE — Progress Notes (Signed)
HPI: FUCM/CHF. Echocardiogram in April of 2013 showed an ejection fraction of 35%, moderate left ventricular enlargement, mild to moderate mitral regurgitation. HIV negative and TSH normal. Cardiac catheterization in April of 2013 showed normal coronary arteries and an ejection fraction of 35-40%. Patient was treated with medications and her cardiomyopathy was felt possibly secondary to hypertension. Echocardiogram repeated in September of 2013. Her ejection fraction was 30-35%. Patient referred to Dr. Rayann Heman for consideration of ICD but the patient decided not to pursue. Also with h/o carotid dissection healed on CTA 4/18. Echocardiogram repeated April 2019 and showed ejection fraction 25 to 30%, moderate to severe mitral regurgitation, biatrial enlargement and mildly reduced LV function.  Patient recently discharged following admission for pneumonia and congestive heart failure.  She required intubation during that admission.  She apparently had been noncompliant with Lasix.  Since DC,  patient denies dyspnea, chest pain, palpitations or syncope.  Current Outpatient Medications  Medication Sig Dispense Refill  . albuterol (PROVENTIL HFA;VENTOLIN HFA) 108 (90 Base) MCG/ACT inhaler Inhale 2 puffs into the lungs every 6 (six) hours as needed for wheezing or shortness of breath. 1 Inhaler 1  . aspirin 81 MG chewable tablet Chew 1 tablet (81 mg total) by mouth daily. 30 tablet 3  . atorvastatin (LIPITOR) 80 MG tablet Take 0.5 tablets (40 mg total) by mouth every evening. 30 tablet 11  . blood glucose meter kit and supplies Dispense based on patient and insurance preference. Use up to four times daily as directed. (FOR ICD-9 250.00, 250.01). 1 each 0  . blood glucose meter kit and supplies Relion Prime or Dispense other brand based on patient and insurance preference. Use up to four times daily as directed. (FOR ICD-9 250.00, 250.01). 1 each 3  . carvedilol (COREG) 6.25 MG tablet Take 1 tablet (6.25  mg total) by mouth 2 (two) times daily with a meal. 60 tablet 11  . furosemide (LASIX) 40 MG tablet Take 1 tablet (40 mg total) by mouth 2 (two) times daily. For Fluid/Heart Failure 60 tablet 11  . gabapentin (NEURONTIN) 300 MG capsule Take 1 capsule (300 mg total) by mouth at bedtime. 30 capsule 3  . hydrALAZINE (APRESOLINE) 25 MG tablet Take 1 tablet (25 mg total) by mouth 3 (three) times daily. 90 tablet 4  . Insulin Glargine (LANTUS SOLOSTAR) 100 UNIT/ML Solostar Pen Inject 15 Units into the skin at bedtime. 10 mL 3  . isosorbide mononitrate (IMDUR) 30 MG 24 hr tablet Take 1 tablet (30 mg total) by mouth daily. 30 tablet 11  . metFORMIN (GLUMETZA) 1000 MG (MOD) 24 hr tablet Take 1 tablet (1,000 mg total) by mouth 2 (two) times daily with a meal. 60 tablet 3  . Potassium Chloride ER 20 MEQ TBCR Take 20 mEq by mouth daily. 1 tab daily by mouth 30 tablet 1   No current facility-administered medications for this visit.      Past Medical History:  Diagnosis Date  . Asthma 05/31/2017  . Diabetes mellitus, type 2 (HCC)    A1C 7.6 April '13  . Hypertension   . Left leg pain 07/21/2015  . Nonischemic cardiomyopathy ([place])    Echo 05/19/11 EF 35% w/ mild-mod MR; R/L Heart Cath 05/21/11 - mildly elevated right heart pressures, normal left heart pressures, preserved cardiac output, widely patent coronary arteries without significant CAD and moderate global systolic LV dysfunction, EF 35-40%.  . 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  Systolic CHF ([place])    Echo 05/19/11 EF 35% w/ mild-mod MR    Past Surgical History:  Procedure Laterality Date  . CERVIX LESION DESTRUCTION    . DILATION AND CURETTAGE OF UTERUS    . LEFT HEART CATHETERIZATION WITH CORONARY ANGIOGRAM N/A 05/21/2011   Procedure: LEFT HEART CATHETERIZATION WITH CORONARY ANGIOGRAM;  Surgeon: Sherren Mocha, MD;  Location: [facility] CATH LAB;  Service: Cardiovascular;  Laterality: N/A;  . TUBAL LIGATION  1996     Social History   Socioeconomic History  . Marital status: Divorced    Spouse name: Not on file  . Number of children: 2  . Years of education: Not on file  . Highest education level: Not on file  Occupational History  . Not on file  Social Needs  . Financial resource strain: Not on file  . Food insecurity:    Worry: Not on file    Inability: Not on file  . Transportation needs:    Medical: Not on file    Non-medical: Not on file  Tobacco Use  . Smoking status: Never Smoker  . Smokeless tobacco: Never Used  Substance and Sexual Activity  . Alcohol use: No  . Drug use: No  . Sexual activity: Yes    Birth control/protection: None  Lifestyle  . Physical activity:    Days per week: Not on file    Minutes per session: Not on file  . Stress: Not on file  Relationships  . Social connections:    Talks on phone: Not on file    Gets together: Not on file    Attends religious service: Not on file    Active member of club or organization: Not on file    Attends meetings of clubs or organizations: Not on file    Relationship status: Not on file  . Intimate partner violence:    Fear of current or ex partner: Not on file    Emotionally abused: Not on file    Physically abused: Not on file    Forced sexual activity: Not on file  Other Topics Concern  . Not on file  Social History Narrative   Lives in [place].  Presently unemployed but attends school full time    Family History  Problem Relation Age of Onset  . Diabetes Unknown        multiple  . Heart failure Paternal Grandmother   . Breast cancer Paternal Grandmother   . Heart disease Unknown        multiple  . Breast cancer Mother     ROS: no fevers or chills, productive cough, hemoptysis, dysphasia, odynophagia, melena, hematochezia, dysuria, hematuria, rash, seizure activity, orthopnea, PND, pedal edema, claudication. Remaining systems are negative.  Physical Exam: Well-developed well-nourished in no acute  distress.  Skin is warm and dry.  HEENT is normal.  Neck is supple.  Chest is clear to auscultation with normal expansion.  Cardiovascular exam is regular rate and rhythm.  Abdominal exam nontender or distended. No masses palpated. Extremities show no edema. neuro grossly intact   A/P  1 chronic systolic congestive heart failure-patient just discharged following admission for pneumonia but predominantly congestive heart failure.  She had not been compliant with her Lasix.  This has been resumed and her symptoms have improved.  We will continue at present dose.  Add Spironolactone 25 mg daily.  Patient will weigh daily and take an additional 40 mg of Lasix for weight gain of 2 to 3 pounds.  We discussed the importance of low-sodium diet and fluid restriction.  Check potassium and renal function on Friday.  Patient may need advanced therapies in the future.  2 cardiomyopathy-etiology remains unclear.  Previous catheterization showed no coronary disease.  Continue beta-blocker.  She is not on an ACE inhibitor or ARB given history of angioedema.  Continue hydralazine/nitrates.  I again discussed the possibility of ICD given persistent reduction in LV function and congestive heart failure.  She is now agreeable.  I will ask Dr. Rayann Heman to review.  3 hypertension-blood pressure is controlled.  Continue present medications.  4 moderate to severe mitral regurgitation-this is worse compared to previous.  She will need follow-up echoes in the future.  5 history of left common carotid artery dissection-healed on last CT scan.  6 history of noncompliance-we discussed the importance of medication and lifestyle modification compliance.  Kirk Ruths, MD

## 2017-07-30 NOTE — Patient Instructions (Signed)
Medication Instructions:   START SPIRONOLACTONE 25 MG ONCE DAILY  Labwork:  Your physician recommends that you return for lab work ON Friday THIS WEEK  Follow-Up:  Your physician recommends that you schedule a follow-up appointment in: 8 WEEKS WITH APP  Your physician recommends that you schedule a follow-up appointment in: 4 MONTHS WITH DR CRENSHAW    REFERRAL TO DR Hillis Range TO DISCUSS ICD

## 2017-07-31 ENCOUNTER — Ambulatory Visit: Payer: Medicaid Other | Attending: Critical Care Medicine | Admitting: Critical Care Medicine

## 2017-07-31 ENCOUNTER — Encounter: Payer: Self-pay | Admitting: Critical Care Medicine

## 2017-07-31 VITALS — BP 133/83 | HR 93 | Temp 97.8°F | Ht 69.0 in | Wt 207.0 lb

## 2017-07-31 DIAGNOSIS — I420 Dilated cardiomyopathy: Secondary | ICD-10-CM

## 2017-07-31 DIAGNOSIS — E785 Hyperlipidemia, unspecified: Secondary | ICD-10-CM | POA: Diagnosis not present

## 2017-07-31 DIAGNOSIS — J810 Acute pulmonary edema: Secondary | ICD-10-CM

## 2017-07-31 DIAGNOSIS — E1169 Type 2 diabetes mellitus with other specified complication: Secondary | ICD-10-CM

## 2017-07-31 DIAGNOSIS — J9601 Acute respiratory failure with hypoxia: Secondary | ICD-10-CM

## 2017-07-31 DIAGNOSIS — R042 Hemoptysis: Secondary | ICD-10-CM | POA: Diagnosis not present

## 2017-07-31 DIAGNOSIS — E1165 Type 2 diabetes mellitus with hyperglycemia: Secondary | ICD-10-CM

## 2017-07-31 DIAGNOSIS — I5043 Acute on chronic combined systolic (congestive) and diastolic (congestive) heart failure: Secondary | ICD-10-CM | POA: Diagnosis not present

## 2017-07-31 DIAGNOSIS — E1142 Type 2 diabetes mellitus with diabetic polyneuropathy: Secondary | ICD-10-CM | POA: Diagnosis not present

## 2017-07-31 DIAGNOSIS — Y95 Nosocomial condition: Secondary | ICD-10-CM | POA: Diagnosis not present

## 2017-07-31 DIAGNOSIS — IMO0001 Reserved for inherently not codable concepts without codable children: Secondary | ICD-10-CM

## 2017-07-31 DIAGNOSIS — Z794 Long term (current) use of insulin: Secondary | ICD-10-CM | POA: Diagnosis not present

## 2017-07-31 DIAGNOSIS — J189 Pneumonia, unspecified organism: Secondary | ICD-10-CM | POA: Diagnosis not present

## 2017-07-31 MED ORDER — ACCU-CHEK AVIVA PLUS W/DEVICE KIT: 1.0000 | PACK | Freq: Three times a day (TID) | 0 refills | Status: DC

## 2017-07-31 MED ORDER — METFORMIN HCL ER 500 MG PO TB24: 500.0000 mg | ORAL_TABLET | Freq: Two times a day (BID) | ORAL | 6 refills | Status: DC

## 2017-07-31 MED ORDER — BLOOD GLUCOSE METER KIT: PACK | 0 refills | Status: DC

## 2017-07-31 MED ORDER — ACCU-CHEK SOFT TOUCH LANCETS MISC: 12 refills | Status: DC

## 2017-07-31 MED ORDER — GLUCOSE BLOOD VI STRP: ORAL_STRIP | 12 refills | Status: DC

## 2017-07-31 MED ORDER — TRUE METRIX AIR GLUCOSE METER DEVI: 1.0000 | Freq: Three times a day (TID) | 0 refills | Status: DC

## 2017-07-31 NOTE — Assessment & Plan Note (Addendum)
HgbA1C 10 Cont lantus 15 units daily Reduce glucophage to 500mg  bid Obtain glucose meter Diet changes clin pharm referral bmet

## 2017-07-31 NOTE — Assessment & Plan Note (Signed)
Resolved

## 2017-07-31 NOTE — Assessment & Plan Note (Signed)
Failed gabapentin d/t nausea/emesis

## 2017-07-31 NOTE — Assessment & Plan Note (Signed)
HCAP resolved No further ABX

## 2017-07-31 NOTE — Assessment & Plan Note (Signed)
Dilated CM Non ischemic Agree with cardiology plans Extra dose torsemide Get spironolactone filled K supp Recheck CMET/CBC Keep EP appt F/u pcp short term

## 2017-07-31 NOTE — Assessment & Plan Note (Signed)
Due to PNA resolved

## 2017-07-31 NOTE — Assessment & Plan Note (Signed)
Cont Statins

## 2017-07-31 NOTE — Progress Notes (Signed)
Subjective:    Patient ID: [name], female    DOB: 18-Jun-1965, 52 y.o.   MRN: [id number]  This is a 52 year old who was admitted to this hospital from 4/19 two 4/21 For dyspnea. She was treated empirically for CAPwith a combination of azithromycin and Rocephin which was switched to [facility] on discharge. Apparently her dyspnea returned to baseline until early this morning. The patient also has a known history of congestive heart failure and her last echocardiogram on 06/01/2017 showed an ejection fraction between 25 and 30%.  Pt returned to ED with respiratory failure and intubated 07/19/17 and d/c 07/26/17 )HFrEF--orthopnea and dyspnea on exertion is resolved, patient was admited withacute on chronic systolic dysfunction CHF in the setting of nonischemic cardiomyopathy, last known EF 25% to 30 % (down from 30 to 35% in August 2018), left heart cath from 05/2011 without obstructive CAD, patientadmits to noncompliance withLasix 40 mg p.o. twice daily at home, PTAshe was apparently taking Lasix once a day and not even every day at that, patient was diuresed with IV Lasix, and transition to p.o. Lasix, renal function remains okay. Fluid balance is negative , weight is down about 8 pounds (from 217lb to 209 lb), continue Coreg 6.25 mg twice daily, give isosorbide/hydralazine in Lieu of ACEI/ARB due to intolerance to ACE.  After discharge patient will need to follow-up with Dr Stanford Breed Dr. Rayann Heman again to further discuss possible AICD placement.Fluid balance is negative , weight is down about 8 pounds (from 217lb to 209 lb), continue Coreg 6.25 mg twice daily, give isosorbide/hydralazine in Lieu of ACEI/ARB due to intolerance to ACE  2)Acute Hypoxic Respiratory failure secondary to #1 above, required intubation on 07/19/2017, extubated on 07/22/2017, ambulating without dyspnea on exertion, hypoxia on room air  3)Possible HCAP-respiratory symptoms continue to improve clinically,clinically and  radiologically respiratory symptoms appear more likely to be secondary to CHF exacerbation as noted above, rather than frank pneumonia, vancomycin was started on 07/19/2017 and discontinued on 07/23/2017, Zosyn started on 07/19/2017,  , continue Mucinex, bronchodilators. No further hypoxia, procalcitonin is trending down.  No further antibiotics indicated at this time  CULTURES: Blood culture x2 6/7>> Sputum culture 6/7>>negative Respiratory panel PCR 6/7>>negative Strep Pneumo Urinary antigen 6/7>>negative  4)DM-uncontrolled with A1c of 10.0, discharged on Lantus 15 units nightly compliance advised ,   5)AKI----acute kidney injury- creatinine on admission= 0.93 , baseline creatinine = 0.9 , creatinine is now= 1.3, Avoid nephrotoxic agents / dehydration / hypotension, repeat BMP with cardiology on 07/30/2017  07/31/2017 Saw Dr Stanford Breed,  meds adjusted. Labs ordered.  Pt feels full, weight is up and down When takes metformin at night will have emesis        Congestive Heart Failure  Presents for follow-up visit. Pertinent negatives include no abdominal pain, chest pain, chest pressure, edema, fatigue, muscle weakness, near-syncope, nocturia, orthopnea, palpitations, paroxysmal nocturnal dyspnea, shortness of breath or unexpected weight change. (Feels full Notes obstipation Notes daily nausea and emesis) The symptoms have been improving. Compliance with total regimen is 76-100%. Compliance problems include adherence to diet and medication side effects (med side effects).  Compliance with diet is 76-100%. Compliance with exercise is 76-100%. Compliance with medications is 51-75%.    )Very low-salt diet advised 2)Weigh yourself daily, call if you gain more than 3 pounds in 1 day or more than 5 pounds in 1 week as your diuretic medications may need to be adjusted 3)Limit your Fluid  intake to no more than 60 ounces (1.8 Liters) per  day 4) take medications as prescribed 5)Please  Follow- up with Dr. Kirk Ruths the cardiologist as scheduled on Tuesday 07/30/17--- to discuss further management of a heart failure, he would need to talk to EP/electrophysiology cardiology about possible AICD (defibrillator) device placement 6) very judicious with alcohol use, avoid alcohol as much as possible 7) repeat BMP/kidney and electrolyte test when you see Dr. Stanford Breed on 07/30/2017, you may need additional potassium supplementation 8)Avoid ibuprofen/Advil/Aleve/Motrin/Goody Powders/Naproxen/BC powders/Meloxicam/Diclofenac/Indomethacin and other Nonsteroidal anti-inflammatory medications as these will make you more likely to bleed and can cause stomach ulcers, can also cause Kidney problems.      Review of Systems  Constitutional: Negative for fatigue, fever and unexpected weight change.  HENT: Negative for mouth sores and nosebleeds.        Mouth is sore  Eyes: Negative.   Respiratory: Negative for cough, choking, chest tightness, shortness of breath, wheezing and stridor.   Cardiovascular: Negative for chest pain, palpitations, leg swelling and near-syncope.  Gastrointestinal: Positive for nausea and vomiting. Negative for abdominal distention, abdominal pain, blood in stool and diarrhea.  Endocrine: Negative.   Genitourinary: Negative for difficulty urinating, dysuria, enuresis, flank pain, frequency and nocturia.  Musculoskeletal: Negative for muscle weakness.  Neurological: Negative for dizziness, syncope, weakness and light-headedness.       Objective:   Physical Exam Vitals:   07/31/17 1110  BP: 133/83  Pulse: 93  Temp: 97.8 F (36.6 C)  SpO2: 99%  Weight: 207 lb (93.9 kg)  Height: _0  (1.753 m)    Gen: Pleasant, well-nourished, in no distress,  normal affect  ENT: No lesions,  mouth clear,  oropharynx clear, no postnasal drip  Neck: No JVD, no TMG, no carotid bruits  Lungs: No use of accessory muscles, no dullness to percussion, clear without rales or  rhonchi  Cardiovascular: RRR, heart sounds normal, no murmur or gallops, no peripheral edema  Abdomen: soft and NT, no HSM,  BS normal  Musculoskeletal: No deformities, no cyanosis or clubbing  Neuro: alert, non focal  Skin: Warm, no lesions or rashes  No results found.  All labs/xrays in hosp reviewed       Assessment & Plan:  I personally reviewed all images and lab data in the [facility] system as well as any outside material available during this office visit and agree with the  radiology impressions.   CAP (community acquired pneumonia) Resolved   HCAP (healthcare-associated pneumonia) HCAP resolved No further ABX  Respiratory failure ([place]) Acute respiratory failure d/t pulm edema PNA resolved  Acute pulmonary edema (HCC) resolved  Dyslipidemia associated with type 2 diabetes mellitus (HCC) Cont Statins  Diabetic polyneuropathy associated with type 2 diabetes mellitus (HCC) Failed gabapentin d/t nausea/emesis  Hemoptysis Due to PNA resolved  Uncontrolled type 2 diabetes mellitus without complication, with long-term current use of insulin (HCC) HgbA1C 10 Cont lantus 15 units daily Reduce glucophage to 575m bid Obtain glucose meter Diet changes clin pharm referral bmet   [place] Dilated CM Non ischemic Agree with cardiology plans Extra dose torsemide Get spironolactone filled K supp Recheck CMET/CBC Keep EP appt F/u pcp short term    BMaryanwas seen today for hospitalization follow-up.  Diagnoses and all orders for this visit:  Acute on chronic combined systolic and diastolic CHF (congestive heart failure) (HCC) -     Comprehensive metabolic panel  Hemoptysis -     CBC with Differential/Platelet; Future -     CBC with Differential/Platelet  Uncontrolled type 2  diabetes mellitus without complication, with long-term current use of insulin (HCC) -     Amb Referral to Clinical Pharmacist  Community acquired  pneumonia, unspecified laterality  HCAP (healthcare-associated pneumonia)  Acute respiratory failure with hypoxia ([place])  Acute pulmonary edema ([place])  Dyslipidemia associated with type 2 diabetes mellitus ([place])  Diabetic polyneuropathy associated with type 2 diabetes mellitus ([place])  DCM (dilated cardiomyopathy) ([place])  Other orders -     Discontinue: metFORMIN (GLUCOPHAGE XR) 500 MG 24 hr tablet; Take 1 tablet (500 mg total) by mouth 2 (two) times daily with a meal. -     blood glucose meter kit and supplies; Dispense based on patient and insurance preference. Use up to four times daily as directed. (FOR ICD-9 250.00, 250.01). -     Discontinue: Blood Glucose Monitoring Suppl (TRUE METRIX AIR GLUCOSE METER) DEVI; 1 strip by Does not apply route 3 (three) times daily. -     metFORMIN (GLUCOPHAGE XR) 500 MG 24 hr tablet; Take 1 tablet (500 mg total) by mouth 2 (two) times daily with a meal. -     Blood Glucose Monitoring Suppl (ACCU-CHEK AVIVA PLUS) w/Device KIT; 1 Device by Does not apply route 3 (three) times daily. -     Lancets (ACCU-CHEK SOFT TOUCH) lancets; Use as instructed -     glucose blood (ACCU-CHEK AVIVA) test strip; Use as instructed

## 2017-07-31 NOTE — Patient Instructions (Addendum)
Stop Gabapentin Stop Glumetza/Glucophage HCL Start Glucophage XR 500mg  twice daily Stay on lasix twice daily Stay on Lantus daily 15 units You need to check your glucose 3 times daily at meal time, record results An appointment will be made with Diabetic clinical pharmacist for education  No further antibiotics Labs today: CMET and CBC.  You do not need to go to lab Friday Return to Dr Laural Benes in 1 week

## 2017-07-31 NOTE — Progress Notes (Signed)
Hospital f/u : PNA  Concerns with medications  Feels like Gabapentin causes GI upset Unable to take

## 2017-07-31 NOTE — Assessment & Plan Note (Signed)
resolved 

## 2017-07-31 NOTE — Assessment & Plan Note (Signed)
Acute respiratory failure d/t pulm edema PNA resolved

## 2017-08-01 LAB — CBC WITH DIFFERENTIAL/PLATELET
BASOS: 1 %
Basophils Absolute: 0.1 10*3/uL (ref 0.0–0.2)
EOS (ABSOLUTE): 0.2 10*3/uL (ref 0.0–0.4)
Eos: 2 %
Hematocrit: 33.4 % — ABNORMAL LOW (ref 34.0–46.6)
Hemoglobin: 10.4 g/dL — ABNORMAL LOW (ref 11.1–15.9)
IMMATURE GRANULOCYTES: 0 %
Immature Grans (Abs): 0 10*3/uL (ref 0.0–0.1)
LYMPHS ABS: 2.5 10*3/uL (ref 0.7–3.1)
Lymphs: 30 %
MCH: 26.7 pg (ref 26.6–33.0)
MCHC: 31.1 g/dL — AB (ref 31.5–35.7)
MCV: 86 fL (ref 79–97)
MONOS ABS: 0.5 10*3/uL (ref 0.1–0.9)
Monocytes: 6 %
NEUTROS ABS: 5 10*3/uL (ref 1.4–7.0)
NEUTROS PCT: 61 %
PLATELETS: 555 10*3/uL — AB (ref 150–450)
RBC: 3.9 x10E6/uL (ref 3.77–5.28)
RDW: 16 % — AB (ref 12.3–15.4)
WBC: 8.3 10*3/uL (ref 3.4–10.8)

## 2017-08-01 LAB — COMPREHENSIVE METABOLIC PANEL
A/G RATIO: 1.3 (ref 1.2–2.2)
ALT: 7 IU/L (ref 0–32)
AST: 9 IU/L (ref 0–40)
Albumin: 4 g/dL (ref 3.5–5.5)
Alkaline Phosphatase: 69 IU/L (ref 39–117)
BUN/Creatinine Ratio: 5 — ABNORMAL LOW (ref 9–23)
BUN: 6 mg/dL (ref 6–24)
Bilirubin Total: 0.8 mg/dL (ref 0.0–1.2)
CALCIUM: 9.2 mg/dL (ref 8.7–10.2)
CO2: 25 mmol/L (ref 20–29)
Chloride: 97 mmol/L (ref 96–106)
Creatinine, Ser: 1.16 mg/dL — ABNORMAL HIGH (ref 0.57–1.00)
GFR calc Af Amer: 63 mL/min/{1.73_m2} (ref >59)
GFR calc non Af Amer: 55 mL/min/{1.73_m2} — ABNORMAL LOW (ref >59)
GLOBULIN, TOTAL: 3 g/dL (ref 1.5–4.5)
Glucose: 156 mg/dL — ABNORMAL HIGH (ref 65–99)
POTASSIUM: 4 mmol/L (ref 3.5–5.2)
SODIUM: 139 mmol/L (ref 134–144)
Total Protein: 7 g/dL (ref 6.0–8.5)

## 2017-08-08 ENCOUNTER — Encounter: Payer: Self-pay | Admitting: Internal Medicine

## 2017-08-08 ENCOUNTER — Ambulatory Visit: Payer: Medicaid Other | Attending: Internal Medicine | Admitting: Internal Medicine

## 2017-08-08 VITALS — BP 139/88 | HR 89 | Temp 98.2°F | Resp 16 | Wt 199.8 lb

## 2017-08-08 DIAGNOSIS — I809 Phlebitis and thrombophlebitis of unspecified site: Secondary | ICD-10-CM | POA: Insufficient documentation

## 2017-08-08 DIAGNOSIS — Z794 Long term (current) use of insulin: Secondary | ICD-10-CM | POA: Insufficient documentation

## 2017-08-08 DIAGNOSIS — IMO0001 Reserved for inherently not codable concepts without codable children: Secondary | ICD-10-CM

## 2017-08-08 DIAGNOSIS — D649 Anemia, unspecified: Secondary | ICD-10-CM | POA: Insufficient documentation

## 2017-08-08 DIAGNOSIS — E785 Hyperlipidemia, unspecified: Secondary | ICD-10-CM | POA: Diagnosis not present

## 2017-08-08 DIAGNOSIS — E1140 Type 2 diabetes mellitus with diabetic neuropathy, unspecified: Secondary | ICD-10-CM | POA: Diagnosis not present

## 2017-08-08 DIAGNOSIS — E1165 Type 2 diabetes mellitus with hyperglycemia: Secondary | ICD-10-CM | POA: Diagnosis not present

## 2017-08-08 DIAGNOSIS — I10 Essential (primary) hypertension: Secondary | ICD-10-CM | POA: Insufficient documentation

## 2017-08-08 DIAGNOSIS — Z1231 Encounter for screening mammogram for malignant neoplasm of breast: Secondary | ICD-10-CM

## 2017-08-08 DIAGNOSIS — Z79899 Other long term (current) drug therapy: Secondary | ICD-10-CM | POA: Insufficient documentation

## 2017-08-08 DIAGNOSIS — Z888 Allergy status to other drugs, medicaments and biological substances status: Secondary | ICD-10-CM | POA: Diagnosis not present

## 2017-08-08 DIAGNOSIS — Z886 Allergy status to analgesic agent status: Secondary | ICD-10-CM | POA: Insufficient documentation

## 2017-08-08 DIAGNOSIS — Z1239 Encounter for other screening for malignant neoplasm of breast: Secondary | ICD-10-CM

## 2017-08-08 DIAGNOSIS — I428 Other cardiomyopathies: Secondary | ICD-10-CM | POA: Insufficient documentation

## 2017-08-08 DIAGNOSIS — Z7982 Long term (current) use of aspirin: Secondary | ICD-10-CM | POA: Insufficient documentation

## 2017-08-08 LAB — GLUCOSE, POCT (MANUAL RESULT ENTRY): POC Glucose: 261 mg/dl — AB (ref 70–99)

## 2017-08-08 MED FILL — ACCU-CHEK AVIVA PLUS METER: W/DEVICE | 30 days supply | Qty: 1 | Fill #0

## 2017-08-08 MED FILL — ACCU-CHEK SOFTCLIX LANCETS: 30 days supply | Qty: 100 | Fill #0

## 2017-08-08 MED FILL — ACCU-CHEK AVIVA PLUS TEST S: 30 days supply | Qty: 100 | Fill #0

## 2017-08-08 NOTE — Progress Notes (Signed)
Patient ID: [name], female    DOB: [birth year]  MRN: [id number]  CC: Diabetes   Subjective: [name] is a 52 y.o. female who presents for chronic ds management. Her concerns today include:  DM with neuropathy. DCM/CHF with EF25% on 05/2016, HL, lung nodules, HTN, hx of carotid dissection  Patient was hospitalized this month with acute respiratory failure requiring intubation secondary to decompensated CHF and pneumonia.  She saw Dr. Joya Gaskins in follow-up on 07/31/2017 DCM/CHF: reports compliance with meds but just got Spironolactone 3 days ago. Taking Hydralazine only BID instead TID. -wgh down 8 lbs -no PND/orthopnea/LE edema -saw Dr. Stanford Breed already.  Will see Dr. Rayann Heman next wk for consideration of ICD.  DM: Not checking blood sugars because she she misplaced her last glucometer.  Dr. Joya Gaskins sent a prescription to the pharmacy for glucometer and testing supplies on her most recent visit.  She was not aware that the prescription was sent to our pharmacy.  She will check into it today before she leaves.   Med: when I last saw her she was suppose to be on Metformin 500 mg 2 tabs BID; dischg from hosp on same dose.  Dr. Joya Gaskins had sent rxn for 500 mg BID.  She is taking just 500 mg once a day.   Taking Lantus once a day as needed 12-15 units   HL: compliant with Lipitor.  Lung nodules: On last visit with me I had put an addendum to my note concerning lung nodules that were seen on imaging.  However patient had CTA of the chest in April of this year.  No mention is made of nodules.  Anemia: Normocytic anemic based on recent CBCs.  H&H were normal in January of this year. She complains of soreness in the forearm right arm from last blood draw.  The soreness is along the vein.  No redness or swelling. Patient Active Problem List   Diagnosis Date Noted  . Physical deconditioning   . Diabetic polyneuropathy associated with type 2 diabetes mellitus ([place]) 02/21/2017  . Lung  nodules 07/16/2016  . Uncontrolled type 2 diabetes mellitus without complication, with long-term current use of insulin ([place]) 07/16/2016  . DCM (dilated cardiomyopathy) ([place])   . Acute on chronic combined systolic and diastolic CHF (congestive heart failure) ([place]) 06/10/2016  . Essential hypertension 06/10/2016  . Dyslipidemia associated with type 2 diabetes mellitus ([place]) 06/10/2016     Current Outpatient Medications on File Prior to Visit  Medication Sig Dispense Refill  . albuterol (PROVENTIL HFA;VENTOLIN HFA) 108 (90 Base) MCG/ACT inhaler Inhale 2 puffs into the lungs every 6 (six) hours as needed for wheezing or shortness of breath. 1 Inhaler 1  . aspirin 81 MG chewable tablet Chew 1 tablet (81 mg total) by mouth daily. 30 tablet 3  . atorvastatin (LIPITOR) 80 MG tablet Take 0.5 tablets (40 mg total) by mouth every evening. 30 tablet 11  . blood glucose meter kit and supplies Dispense based on patient and insurance preference. Use up to four times daily as directed. (FOR ICD-9 250.00, 250.01). 1 each 0  . Blood Glucose Monitoring Suppl (ACCU-CHEK AVIVA PLUS) w/Device KIT 1 Device by Does not apply route 3 (three) times daily. 1 kit 0  . carvedilol (COREG) 6.25 MG tablet Take 1 tablet (6.25 mg total) by mouth 2 (two) times daily with a meal. 60 tablet 11  . furosemide (LASIX) 40 MG tablet Take 1 tablet (40 mg total) by mouth 2 (two) times daily.  For Fluid/Heart Failure 60 tablet 11  . glucose blood (ACCU-CHEK AVIVA) test strip Use as instructed 100 each 12  . hydrALAZINE (APRESOLINE) 25 MG tablet Take 1 tablet (25 mg total) by mouth 3 (three) times daily. 90 tablet 4  . Insulin Glargine (LANTUS SOLOSTAR) 100 UNIT/ML Solostar Pen Inject 15 Units into the skin at bedtime. 10 mL 3  . isosorbide mononitrate (IMDUR) 30 MG 24 hr tablet Take 1 tablet (30 mg total) by mouth daily. 30 tablet 11  . Lancets (ACCU-CHEK SOFT TOUCH) lancets Use as instructed 100 each 12  . metFORMIN (GLUCOPHAGE XR) 500  MG 24 hr tablet Take 1 tablet (500 mg total) by mouth 2 (two) times daily with a meal. 60 tablet 6  . Potassium Chloride ER 20 MEQ TBCR Take 20 mEq by mouth daily. 1 tab daily by mouth 30 tablet 1  . spironolactone (ALDACTONE) 25 MG tablet Take 1 tablet (25 mg total) by mouth daily. (Patient not taking: Reported on 07/31/2017) 90 tablet 3   No current facility-administered medications on file prior to visit.     Allergies  Allergen Reactions  . Lisinopril Hives and Swelling    Facial   . Sertraline Hives and Swelling    Facial   . Tramadol Hives and Swelling    facial  . Ibuprofen Hives and Swelling    Social History   Socioeconomic History  . Marital status: Divorced    Spouse name: Not on file  . Number of children: 2  . Years of education: Not on file  . Highest education level: Not on file  Occupational History  . Not on file  Social Needs  . Financial resource strain: Not on file  . Food insecurity:    Worry: Not on file    Inability: Not on file  . Transportation needs:    Medical: Not on file    Non-medical: Not on file  Tobacco Use  . Smoking status: Never Smoker  . Smokeless tobacco: Never Used  Substance and Sexual Activity  . Alcohol use: No  . Drug use: No  . Sexual activity: Yes    Birth control/protection: None  Lifestyle  . Physical activity:    Days per week: Not on file    Minutes per session: Not on file  . Stress: Not on file  Relationships  . Social connections:    Talks on phone: Not on file    Gets together: Not on file    Attends religious service: Not on file    Active member of club or organization: Not on file    Attends meetings of clubs or organizations: Not on file    Relationship status: Not on file  . Intimate partner violence:    Fear of current or ex partner: Not on file    Emotionally abused: Not on file    Physically abused: Not on file    Forced sexual activity: Not on file  Other Topics Concern  . Not on file  Social  History Narrative   Lives in [place].  Presently unemployed but attends school full time    Family History  Problem Relation Age of Onset  . Diabetes Unknown        multiple  . Heart failure Paternal Grandmother   . Breast cancer Paternal Grandmother   . Heart disease Unknown        multiple  . Breast cancer Mother     Past Surgical History:  Procedure Laterality Date  .  CERVIX LESION DESTRUCTION    . DILATION AND CURETTAGE OF UTERUS    . LEFT HEART CATHETERIZATION WITH CORONARY ANGIOGRAM N/A 05/21/2011   Procedure: LEFT HEART CATHETERIZATION WITH CORONARY ANGIOGRAM;  Surgeon: Sherren Mocha, MD;  Location: [facility] CATH LAB;  Service: Cardiovascular;  Laterality: N/A;  . TUBAL LIGATION  1996    ROS: Review of Systems Negative except as stated above PHYSICAL EXAM: BP 139/88   Pulse 89   Temp 98.2 F (36.8 C) (Oral)   Resp 16   Wt 199 lb 12.8 oz (90.6 kg)   LMP 07/15/2017 (Exact Date)   SpO2 100%   BMI 29.51 kg/m   Wt Readings from Last 3 Encounters:  08/08/17 199 lb 12.8 oz (90.6 kg)  07/31/17 207 lb (93.9 kg)  07/30/17 211 lb (95.7 kg)    Physical Exam  General appearance - alert, well appearing, middle-aged African-American female and in no distress Mental status -patient seems a bit frustrated especially when talking about the dosing on her medications  Eyes -pink conjunctiva.  Extraocular movement intact. Neck - supple, no significant adenopathy Chest - clear to auscultation, no wheezes, rales or rhonchi, symmetric air entry Heart - normal rate, regular rhythm, normal S1, S2, no murmurs, rubs, clicks or gallops Extremities - peripheral pulses normal, no pedal edema, no clubbing or cyanosis Mild tenderness along a 5 cm strip of vein in the right medial proximal forearm.  No overlying erythema.  Slight edema Results for orders placed or performed in visit on 08/08/17  POCT glucose (manual entry)  Result Value Ref Range   POC Glucose 261 (A) 70 - 99 mg/dl   Lab  Results  Component Value Date   HGBA1C 10.0 (H) 07/19/2017   Lab Results  Component Value Date   WBC 8.3 07/31/2017   HGB 10.4 (L) 07/31/2017   HCT 33.4 (L) 07/31/2017   MCV 86 07/31/2017   PLT 555 (H) 07/31/2017     Chemistry      Component Value Date/Time   NA 139 07/31/2017 1225   K 4.0 07/31/2017 1225   CL 97 07/31/2017 1225   CO2 25 07/31/2017 1225   BUN 6 07/31/2017 1225   CREATININE 1.16 (H) 07/31/2017 1225   CREATININE 0.92 06/29/2016 1031      Component Value Date/Time   CALCIUM 9.2 07/31/2017 1225   ALKPHOS 69 07/31/2017 1225   AST 9 07/31/2017 1225   ALT 7 07/31/2017 1225   BILITOT 0.8 07/31/2017 1225        ASSESSMENT AND PLAN: 1. Uncontrolled type 2 diabetes mellitus without complication, with long-term current use of insulin ([place]) -Stressed the importance of compliance with medications.  Advised to take metformin twice a day as prescribed.  Advised to take Lantus once a day and not as needed. She will check with the pharmacy today to pick up meter and strips Follow-up with clinical pharmacist in 1 month for further titration of metformin and/or insulin. - POCT glucose (manual entry) - Microalbumin / creatinine urine ratio - Amb ref to Medical Nutrition Therapy-MNT - Ambulatory referral to Ophthalmology  2. Nonischemic cardiomyopathy ([place]) Compensated.  Stressed continued compliance with medications and low-salt diet.  She is to take the hydralazine 3 times a day as prescribed  3. Essential hypertension See #2 above  4. Hyperlipidemia, unspecified hyperlipidemia type Continue atorvastatin  5. Anemia, unspecified type - Iron, TIBC and Ferritin Panel  6. Breast cancer screening - MM Digital Screening; Future  7. Phlebitis Warm compresses to the  area several times a day.   Patient was given the opportunity to ask questions.  Patient verbalized understanding of the plan and was able to repeat key elements of the plan.   Orders Placed This  Encounter  Procedures  . MM Digital Screening  . Microalbumin / creatinine urine ratio  . Iron, TIBC and Ferritin Panel  . Amb ref to Medical Nutrition Therapy-MNT  . Ambulatory referral to Ophthalmology  . POCT glucose (manual entry)     Requested Prescriptions    No prescriptions requested or ordered in this encounter    Return in about 2 months (around 10/08/2017).  Karle Plumber, MD, FACP

## 2017-08-08 NOTE — Patient Instructions (Signed)
Please give appointment with Franky Macho in 1 month for insulin titration  Take Hydralazine three times a day as prescribed.  Take Metformin 500 mg twice a day as prescribed. Take Lantus insulin 15 units daily as prescribed. Please stop at our pharmacy to get the glucometer and stripes.   Check blood sugars 1-2 times a day before meals and bring in readings on visit with clinical pharmacist.

## 2017-08-09 ENCOUNTER — Telehealth: Payer: Self-pay

## 2017-08-09 LAB — IRON,TIBC AND FERRITIN PANEL
Ferritin: 184 ng/mL — ABNORMAL HIGH (ref 15–150)
Iron Saturation: 15 % (ref 15–55)
Iron: 46 ug/dL (ref 27–159)
Total Iron Binding Capacity: 297 ug/dL (ref 250–450)
UIBC: 251 ug/dL (ref 131–425)

## 2017-08-09 LAB — MICROALBUMIN / CREATININE URINE RATIO
CREATININE, UR: 161.9 mg/dL
Microalb/Creat Ratio: 51 mg/g creat — ABNORMAL HIGH (ref 0.0–30.0)
Microalbumin, Urine: 82.6 ug/mL

## 2017-08-09 NOTE — Telephone Encounter (Signed)
Contacted pt to go over lab results pt didn't answer left a detailed vm informing pt of results and if she has any questions or concerns to give me a call  If pt calls back please give results: she does not have iron deficiency anemia. Her anemia is most likely due to her chronic medical issues including kidney function not being 100%. We will monitor her blood count by rechecking on follow-up visit.

## 2017-08-11 ENCOUNTER — Encounter: Payer: Self-pay | Admitting: Internal Medicine

## 2017-08-11 DIAGNOSIS — R809 Proteinuria, unspecified: Secondary | ICD-10-CM | POA: Insufficient documentation

## 2017-08-12 ENCOUNTER — Ambulatory Visit: Payer: Medicaid Other | Admitting: Internal Medicine

## 2017-08-12 ENCOUNTER — Encounter: Payer: Self-pay | Admitting: Internal Medicine

## 2017-08-12 ENCOUNTER — Encounter (INDEPENDENT_AMBULATORY_CARE_PROVIDER_SITE_OTHER): Payer: Self-pay

## 2017-08-12 VITALS — BP 124/66 | HR 94 | Ht 69.0 in | Wt 198.0 lb

## 2017-08-12 DIAGNOSIS — I428 Other cardiomyopathies: Secondary | ICD-10-CM | POA: Diagnosis not present

## 2017-08-12 DIAGNOSIS — I519 Heart disease, unspecified: Secondary | ICD-10-CM

## 2017-08-12 DIAGNOSIS — Z01812 Encounter for preprocedural laboratory examination: Secondary | ICD-10-CM

## 2017-08-12 NOTE — Patient Instructions (Signed)
Medication Instructions:  Your physician recommends that you continue on your current medications as directed. Please refer to the Current Medication list given to you today.     * If you need a refill on your cardiac medications before your next appointment, please call your pharmacy. *   Labwork: Your physician recommends that you return for pre procedure lab work between 7/18 - 7/30: BMET & CBC * Will notify you of abnormal results, otherwise continue current treatment plan.*   Testing/Procedures: Your physician has recommended that you have a defibrillator inserted. An implantable cardioverter defibrillator (ICD) is a small device that is placed in your chest or, in rare cases, your abdomen. This device uses electrical pulses or shocks to help control life-threatening, irregular heartbeats that could lead the heart to suddenly stop beating (sudden cardiac arrest). Leads are attached to the ICD that goes into your heart. This is done in the hospital and usually requires an overnight stay. Please follow the instructions below, located under the special instructions section.   Follow-Up: Your physician recommends that you schedule a wound check appointment 10-14 days, after your procedure on 09/12/2017, with the device clinic.  Your physician recommends that you schedule a follow up appointment in 91 days, after your procedure on 09/12/2017, with Dr. Johney Frame.  * Please note that any paperwork needing to be filled out by the provider will need to be addressed at the front desk prior to seeing the provider.  Please note that any FMLA, disability or other documents regarding health condition is subject to a $25.00 charge that must be received prior to completion of paperwork in the form of a money order or check. *  Thank you for choosing [facility]!!     Any Other Special Instructions Will Be Listed Below (If Applicable).   Implantable Device Instructions  You are scheduled for:                 _____ Implantable Cardioverter Defibrillator  on  09/12/2017  with Dr. Johney Frame.  1.   Please arrive at the [facility], Entrance "A"  at [facility] at  1:00 p.m. on the day of your procedure. (The address is [address])  2. Do not eat or drink after midnight the night before your procedure.  3.   Complete pre procedure  lab work on ______.  The lab at [facility] is open from 8:00 AM to 4:30 PM.  You do not have to be fasting.  4.   A) - Take 1/2 your usual dose of Lantus, at bedtime, the night before this procedure.           - Hold the following medications the morning of your procedure:    1. Metformin  2. Lasix           - All of your remaining medications may be taken with a small amount of water the   morning of your procedure.  5.  Plan for an overnight stay.  Bring your insurance cards and a list of you medications.  6.  Wash your chest and neck with surgical scrub the evening before and the morning of  your procedure.  Rinse well. Please review the surgical scrub instruction sheet given to you.  7. Your chest will need to be shaved prior to this procedure (if needed). We ask that you do this yourself at home 1 to 2 days before or if uncomfortable/unable to do yourself, then  it will be performed by the hospital staff the day of.                                                                                                                * If you have ANY questions after you get home, please call Ancil Boozer, RN @ [phone].  * Every attempt is made to prevent procedures from being rescheduled.  Due to the nature of  Electrophysiology, rescheduling can happen.  The physician is always aware and directs the staff when this occurs.    Cardioverter Defibrillator Implantation An implantable cardioverter defibrillator (ICD) is a small, lightweight, battery-powered device that is placed (implanted) under the skin in the chest or  abdomen. Your caregiver may prescribe an ICD if:  You have had an irregular heart rhythm (arrhythmia) that originated in the lower chambers of the heart (ventricles).  Your heart has been damaged by a disease (such as coronary artery disease) or heart condition (such as a heart attack). An ICD consists of a battery that lasts several years, a small computer called a pulse generator, and wires called leads that go into the heart. It is used to detect and correct two dangerous arrhythmias: a rapid heart rhythm (tachycardia) and an arrhythmia in which the ventricles contract in an uncoordinated way (fibrillation). When an ICD detects tachycardia, it sends an electrical signal to the heart that restores the heartbeat to normal (cardioversion). This signal is usually painless. If cardioversion does not work or if the ICD detects fibrillation, it delivers a small electrical shock to the heart (defibrillation) to restart the heart. The shock may feel like a strong jolt in the chest. ICDs may be programmed to correct other problems. Sometimes, ICDs are programmed to act as another type of implantable device called a pacemaker. Pacemakers are used to treat a slow heartbeat (bradycardia). LET YOUR CAREGIVER KNOW ABOUT:  Any allergies you have.  All medicines you are taking, including vitamins, herbs, eyedrops, and over-the-counter medicines and creams.  Previous problems you or members of your family have had with the use of anesthetics.  Any blood disorders you have had.  Other health problems you have. RISKS AND COMPLICATIONS Generally, the procedure to implant an ICD is safe. However, as with any surgical procedure, complications can occur. Possible complications associated with implanting an ICD include:  Swelling, bleeding, or bruising at the site where the ICD was implanted.  Infection at the site where the ICD was implanted.  A reaction to medicine used during the procedure.  Nerve, heart, or  blood vessel damage.  Blood clots. BEFORE THE PROCEDURE  You may need to have blood tests, heart tests, or a chest X-ray done before the day of the procedure.  Ask your caregiver about changing or stopping your regular medicines.  Make plans to have someone drive you home. You may need to stay in the hospital overnight after the procedure.  Stop smoking at least 24 hours before the procedure.  Take a bath or shower the  night before the procedure. You may need to scrub your chest or abdomen with a special type of soap.  Do not eat or drink before your procedure for as long as directed by your caregiver. Ask if it is okay to take any needed medicine with a small sip of water. PROCEDURE  The procedure to implant an ICD in your chest or abdomen is usually done at a hospital in a room that has a large X-ray machine called a fluoroscope. The machine will be above you during the procedure. It will help your caregiver see your heart during the procedure. Implanting an ICD usually takes 1-3 hours. Before the procedure:   Small monitors will be put on your body. They will be used to check your heart, blood pressure, and oxygen level.  A needle will be put into a vein in your hand or arm. This is called an intravenous (IV) access tube. Fluids and medicine will flow directly into your body through the IV tube.  Your chest or abdomen will be cleaned with a germ-killing (antiseptic) solution. The area may be shaved.  You may be given medicine to help you relax (sedative).  You will be given a medicine called a local anesthetic. This medicine will make the surgical site numb while the ICD is implanted. You will be sleepy but awake during the procedure. After you are numb the procedure will begin. The caregiver will:  Make a small cut (incision). This will make a pocket deep under your skin that will hold the pulse generator.  Guide the leads through a large blood vessel into your heart and attach  them to the heart muscles. Depending on the ICD, the leads may go into one ventricle or they may go to both ventricles and into an upper chamber of the heart (atrium).  Test the ICD.  Close the incision with stitches, glue, or staples. AFTER THE PROCEDURE  You may feel pain. Some pain is normal. It may last a few days.  You may stay in a recovery area until the local anesthetic has worn off. Your blood pressure and pulse will be checked often. You will be taken to a room where your heart will be monitored.  A chest X-ray will be taken. This is done to check that the cardioverter defibrillator is in the right place.  You may stay in the hospital overnight.  A slight bump may be seen over the skin where the ICD was placed. Sometimes, it is possible to feel the ICD under the skin. This is normal.  In the months and years afterward, your caregiver will check the device, the leads, and the battery every few months. Eventually, when the battery is low, the ICD will be replaced.   This information is not intended to replace advice given to you by your health care provider. Make sure you discuss any questions you have with your health care provider.   Document Released: 10/21/2001 Document Revised: 11/19/2012 Document Reviewed: 02/18/2012 Elsevier Interactive Patient Education 2016 [employer]   Cardioverter Defibrillator Implantation, Care After This sheet gives you information about how to care for yourself after your procedure. Your health care provider may also give you more specific instructions. If you have problems or questions, contact your health care provider. What can I expect after the procedure? After the procedure, it is common to have:  Some pain. It may last a few days.  A slight bump over the skin where the device was placed. Sometimes, it  is possible to feel the device under the skin. This is normal.  During the months and years after your procedure, your health care  provider will check the device, the leads, and the battery every few months. Eventually, when the battery is low, the device will be replaced. Follow these instructions at home: Medicines  Take over-the-counter and prescription medicines only as told by your health care provider.  If you were prescribed an antibiotic medicine, take it as told by your health care provider. Do not stop taking the antibiotic even if you start to feel better. Incision care   Follow instructions from your health care provider about how to take care of your incision area. Make sure you: ? Wash your hands with soap and water before you change your bandage (dressing). If soap and water are not available, use hand sanitizer. ? Change your dressing as told by your health care provider. ? Leave stitches (sutures), skin glue, or adhesive strips in place. These skin closures may need to stay in place for 2 weeks or longer. If adhesive strip edges start to loosen and curl up, you may trim the loose edges. Do not remove adhesive strips completely unless your health care provider tells you to do that.  Check your incision area every day for signs of infection. Check for: ? More redness, swelling, or pain. ? More fluid or blood. ? Warmth. ? Pus or a bad smell.  Do not use lotions or ointments near the incision area unless told by your health care provider.  Keep the incision area clean and dry for 2-3 days after the procedure or for as long as told by your health care provider. It takes several weeks for the incision site to heal completely.  Do not take baths, swim, or use a hot tub until your health care provider approves. Activity  Try to walk a little every day. Exercising is important after this procedure. Also, use your shoulder on the side of the defibrillator in daily tasks that do not require a lot of motion.  For at least 6 weeks: ? Do not lift your upper arm above your shoulders. This means no tennis, golf,  or swimming for this period of time. If you tend to sleep with your arm above your head, use a restraint to prevent this during sleep. ? Avoid sudden jerking, pulling, or chopping movements that pull your upper arm far away from your body.  Ask your health care provider when you may go back to work.  Check with your health care provider before you start to drive or play sports. Electric and magnetic fields  Tell all health care providers that you have a defibrillator. This may prevent them from giving you an MRI scan because strong magnets are used for that test.  If you must pass through a metal detector, quickly walk through it. Do not stop under the detector, and do not stand near it.  Avoid places or objects that have a strong electric or magnetic field, including: ? [employer]. At the airport, let officials know that you have a defibrillator. Your defibrillator ID card will let you be checked in a way that is safe for you and will not damage your defibrillator. Also, do not let a security person wave a magnetic wand near your defibrillator. That can make it stop working. ? Power plants. ? Large electrical generators. ? Anti-theft systems or electronic article surveillance (EAS). ? Radiofrequency transmission towers, such as cell phone  and radio towers.  Do not use amateur (ham) radio equipment or electric (arc) welding torches. Some devices are safe to use if held at least 12 inches (30 cm) from your defibrillator. These include power tools, lawn mowers, and speakers. If you are unsure if something is safe to use, ask your health care provider.  Do not use MP3 player headphones. They have magnets.  You may safely use electric blankets, heating pads, computers, and microwave ovens.  When using your cell phone, hold it to the ear that is on the opposite side from the defibrillator. Do not leave your cell phone in a pocket over the defibrillator. General instructions  Follow  diet instructions from your health care provider, if this applies.  Always keep your defibrillator ID card with you. The card should list the implant date, device model, and manufacturer. Consider wearing a medical alert bracelet or necklace.  Have your defibrillator checked every 3-6 months or as often as told by your health care provider. Most defibrillators last for 4-8 years.  Keep all follow-up visits as told by your health care provider. This is important for your health care provider to make sure your chest is healing the way it should. Ask your health care provider when you should come back to have your stitches or staples taken out. Contact a health care provider if:  You feel one shock in your chest.  You gain weight suddenly.  Your legs or feet swell more than they have before.  It feels like your heart is fluttering or skipping beats (heart palpitations).  You have more redness, swelling, or pain around your incision.  You have more fluid or blood coming from your incision.  Your incision feels warm to the touch.  You have pus or a bad smell coming from your incision.  You have a fever. Get help right away if:  You have chest pain.  You feel more than one shock.  You feel more short of breath than you have felt before.  You feel more light-headed than you have felt before.  Your incision starts to open up. This information is not intended to replace advice given to you by your health care provider. Make sure you discuss any questions you have with your health care provider. Document Released: 08/18/2004 Document Revised: 08/19/2015 Document Reviewed: 07/06/2015 Elsevier Interactive Patient Education  2018 [employer].   Supplemental Discharge Instructions for  Pacemaker/Defibrillator Patients  ACTIVITY No heavy lifting or vigorous activity with your left/right arm for 6 to 8 weeks.  Do not raise your left/right arm above your head for one week.  Gradually  raise your affected arm as drawn below.           __  NO DRIVING for     ; you may begin driving on     .  WOUND CARE - Keep the wound area clean and dry.  Do not get this area wet for one week. No showers for one week; you may shower on     . - The tape/steri-strips on your wound will fall off; do not pull them off.  No bandage is needed on the site.  DO  NOT apply any creams, oils, or ointments to the wound area. - If you notice any drainage or discharge from the wound, any swelling or bruising at the site, or you develop a fever > 101? F after you are discharged home, call the office at once.  SPECIAL INSTRUCTIONS - You  are still able to use cellular telephones; use the ear opposite the side where you have your pacemaker/defibrillator.  Avoid carrying your cellular phone near your device. - When traveling through airports, show security personnel your identification card to avoid being screened in the metal detectors.  Ask the security personnel to use the hand wand. - Avoid arc welding equipment, MRI testing (magnetic resonance imaging), TENS units (transcutaneous nerve stimulators).  Call the office for questions about other devices. - Avoid electrical appliances that are in poor condition or are not properly grounded. - Microwave ovens are safe to be near or to operate.  ADDITIONAL INFORMATION FOR DEFIBRILLATOR PATIENTS SHOULD YOUR DEVICE GO OFF: - If your device goes off ONCE and you feel fine afterward, notify the device clinic nurses. - If your device goes off ONCE and you do not feel well afterward, call 911. - If your device goes off TWICE, call 911. - If your device goes off THREE TIMES IN ONE DAY, call 911.  DO NOT DRIVE YOURSELF OR A FAMILY MEMBER WITH A DEFIBRILLATOR TO THE HOSPITAL-CALL 911.

## 2017-08-12 NOTE — Progress Notes (Signed)
Electrophysiology Office Note   Date:  08/12/2017   ID:  [name], DOB 18-Mar-1965, MRN [id number]  PCP:  Ladell Pier, MD  Cardiologist:  Dr Stanford Breed Primary Electrophysiologist: Montanari Grayer, MD    CC: CHF   History of Present Illness: [name] is a 52 y.o. female who presents today for electrophysiology evaluation.   She is referred by Dr Stanford Breed for EP consultation regarding risks of sudden death.  She was initially diagnosed with nonischemic CM in 2013 after presenting with progressive SOB.  Cath at that time revealed normal cors.  She has been treated with medical therapy since that time.  She was evaluated by me then and considered ICD implant but declined.  She has done reasonably well.  She continues to have progressive cardiomyopathy.  She has moderate to severe MR with EF 25% and biatrial enlargement.  She was hospitalized in June with worsening CHF.  This occurred in the setting of ETOH, increased sodium, and reduced lasix.  She has done better since that time.  She has previously taken ACE inhibitors but had angioedema and is no longer taking them.  Today, she denies symptoms of palpitations, chest pain, shortness of breath, orthopnea, PND, lower extremity edema, claudication, dizziness, presyncope, syncope, bleeding, or neurologic sequela. The patient is tolerating medications without difficulties and is otherwise without complaint today.    Past Medical History:  Diagnosis Date  . Asthma 05/31/2017  . Diabetes mellitus, type 2 (HCC)    A1C 7.6 April '13  . Hypertension   . Left leg pain 07/21/2015  . Nonischemic cardiomyopathy ([place])    Echo 05/19/11 EF 35% w/ mild-mod MR; R/L Heart Cath 05/21/11 - mildly elevated right heart pressures, normal left heart pressures, preserved cardiac output, widely patent coronary arteries without significant CAD and moderate global systolic LV dysfunction, EF 35-40%.  . 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 Systolic CHF ([place])    Echo 05/19/11 EF 35% w/ mild-mod MR   Past Surgical History:  Procedure Laterality Date  . CERVIX LESION DESTRUCTION    . DILATION AND CURETTAGE OF UTERUS    . LEFT HEART CATHETERIZATION WITH CORONARY ANGIOGRAM N/A 05/21/2011   Procedure: LEFT HEART CATHETERIZATION WITH CORONARY ANGIOGRAM;  Surgeon: Sherren Mocha, MD;  Location: [facility] CATH LAB;  Service: Cardiovascular;  Laterality: N/A;  . TUBAL LIGATION  1996     Current Outpatient Medications  Medication Sig Dispense Refill  . albuterol (PROVENTIL HFA;VENTOLIN HFA) 108 (90 Base) MCG/ACT inhaler Inhale 2 puffs into the lungs every 6 (six) hours as needed for wheezing or shortness of breath. 1 Inhaler 1  . aspirin 81 MG chewable tablet Chew 1 tablet (81 mg total) by mouth daily. 30 tablet 3  . atorvastatin (LIPITOR) 80 MG tablet Take 0.5 tablets (40 mg total) by mouth every evening. 30 tablet 11  . blood glucose meter kit and supplies Dispense based on patient and insurance preference. Use up to four times daily as directed. (FOR ICD-9 250.00, 250.01). 1 each 0  . Blood Glucose Monitoring Suppl (ACCU-CHEK AVIVA PLUS) w/Device KIT 1 Device by Does not apply route 3 (three) times daily. 1 kit 0  . carvedilol (COREG) 6.25 MG tablet Take 1 tablet (6.25 mg total) by mouth 2 (two) times daily with a meal. 60 tablet 11  . furosemide (LASIX) 40 MG tablet Take 1 tablet (40 mg total) by mouth 2 (two) times daily. For Fluid/Heart Failure 60 tablet 11  .  glucose blood (ACCU-CHEK AVIVA) test strip Use as instructed 100 each 12  . hydrALAZINE (APRESOLINE) 25 MG tablet Take 1 tablet (25 mg total) by mouth 3 (three) times daily. 90 tablet 4  . Insulin Glargine (LANTUS SOLOSTAR) 100 UNIT/ML Solostar Pen Inject 15 Units into the skin at bedtime. 10 mL 3  . isosorbide mononitrate (IMDUR) 30 MG 24 hr tablet Take 1 tablet (30 mg total) by mouth daily. 30 tablet 11  . Lancets (ACCU-CHEK SOFT TOUCH) lancets Use as instructed 100  each 12  . metFORMIN (GLUCOPHAGE XR) 500 MG 24 hr tablet Take 1 tablet (500 mg total) by mouth 2 (two) times daily with a meal. 60 tablet 6  . Potassium Chloride ER 20 MEQ TBCR Take 20 mEq by mouth daily. 1 tab daily by mouth 30 tablet 1  . spironolactone (ALDACTONE) 25 MG tablet Take 1 tablet (25 mg total) by mouth daily. 90 tablet 3   No current facility-administered medications for this visit.     Allergies:   Lisinopril; Sertraline; Tramadol; and Ibuprofen   Social History:  The patient  reports that she has never smoked. She has never used smokeless tobacco. She reports that she does not drink alcohol or use drugs.   Family History:  The patient's  family history includes Breast cancer in her mother and paternal grandmother; Diabetes in her unknown relative; Heart disease in her unknown relative; Heart failure in her paternal grandmother.    ROS:  Please see the history of present illness.   All other systems are personally reviewed and negative.    PHYSICAL EXAM: VS:  BP 124/66   Pulse 94   Ht 5' 9"  (1.753 m)   Wt 198 lb (89.8 kg)   LMP 07/15/2017 (Exact Date)   BMI 29.24 kg/m  , BMI Body mass index is 29.24 kg/m. GEN: Well nourished, well developed, in no acute distress  HEENT: normal  Neck: no JVD, carotid bruits, or masses Cardiac: RRR; no murmurs, rubs, or gallops,no edema  Respiratory:  clear to auscultation bilaterally, normal work of breathing GI: soft, nontender, nondistended, + BS MS: no deformity or atrophy  Skin: warm and dry  Neuro:  Strength and sensation are intact Psych: euthymic mood, full affect  EKG:  EKG is ordered today. The ekg ordered today is personally reviewed and shows sinus rhythm 94 bpm, PR 168 msec, QRQS 76 msec, Qtc 495 msec   Recent Labs: 06/01/2017: TSH 0.656 07/19/2017: B Natriuretic Peptide 582.5 07/25/2017: Magnesium 1.8 07/31/2017: ALT 7; BUN 6; Creatinine, Ser 1.16; Hemoglobin 10.4; Platelets 555; Potassium 4.0; Sodium 139    personally reviewed   Lipid Panel     Component Value Date/Time   CHOL 187 05/05/2015 1645   TRIG 131 07/19/2017 1347   HDL 29 (L) 05/05/2015 1645   CHOLHDL 6.4 (H) 05/05/2015 1645   VLDL 37 (H) 05/05/2015 1645   LDLCALC 121 05/05/2015 1645   personally reviewed   Wt Readings from Last 3 Encounters:  08/12/17 198 lb (89.8 kg)  08/08/17 199 lb 12.8 oz (90.6 kg)  07/31/17 207 lb (93.9 kg)      Other studies personally reviewed: Additional studies/ records that were reviewed today include: Dr Jacalyn Lefevre notes, recent hospital records, echo  Review of the above records today demonstrates: as above  Echo 06/01/17 reveals EF 25%, moderate to severe MR, moderate biatrial enlargement   ASSESSMENT AND PLAN:  1.  Nonischemic CM/ chronic systolic dysfunction The patient has a nonischemic CM (EF 25%),  NYHA Class III CHF.  She is referred by Dr Stanford Breed for risk stratification of sudden death and consideration of ICD implantation.  At this time, she meets MADIT II/ SCD-HeFT criteria for ICD implantation for primary prevention of sudden death. QRS < 130 msec and therefore she does not meet criteria for CRT.  I have had a thorough discussion with the patient reviewing options.  The patient has had opportuniy to ask questions and have them answered. The patient and I have decided together through a shared decision making process to proceed with single chamber ICD at this time.   Risks, benefits, alternatives to ICD implantation were discussed in detail with the patient today. The patient understands that the risks include but are not limited to bleeding, infection, pneumothorax, perforation, tamponade, vascular damage, renal failure, MI, stroke, death, inappropriate shocks, and lead dislodgement and wishes to proceed.  We will therefore schedule device implantation at the next available time.  I have requested Chemical engineer for this implant.   Current medicines are reviewed at length with the  patient today.   The patient does not have concerns regarding her medicines.  The following changes were made today:  none   Signed, Hedeen Grayer, MD  08/12/2017 8:53 AM     [facility] [address] [place] [address] [phone] (office) [phone] (fax)

## 2017-08-13 ENCOUNTER — Telehealth: Payer: Self-pay

## 2017-08-13 NOTE — Telephone Encounter (Signed)
Contacted pt to go over lab results pt is aware of results and doesn't have any questions or concerns 

## 2017-08-19 ENCOUNTER — Telehealth: Payer: Self-pay | Admitting: Internal Medicine

## 2017-08-19 NOTE — Telephone Encounter (Signed)
New Message:   Pt called and wanted you to please cancel her procedure for 09-12-17 please.

## 2017-08-19 NOTE — Telephone Encounter (Signed)
Procedure cancelled per pt. Notified Precert and scheduling. Pt has f/u in August with APP.  No further action at this time.

## 2017-09-03 ENCOUNTER — Other Ambulatory Visit: Payer: Medicaid Other

## 2017-09-09 ENCOUNTER — Encounter: Payer: Medicaid Other | Attending: Internal Medicine | Admitting: Skilled Nursing Facility1

## 2017-09-09 ENCOUNTER — Encounter: Payer: Self-pay | Admitting: Skilled Nursing Facility1

## 2017-09-09 DIAGNOSIS — Z794 Long term (current) use of insulin: Secondary | ICD-10-CM | POA: Diagnosis not present

## 2017-09-09 DIAGNOSIS — IMO0001 Reserved for inherently not codable concepts without codable children: Secondary | ICD-10-CM

## 2017-09-09 DIAGNOSIS — Z713 Dietary counseling and surveillance: Secondary | ICD-10-CM | POA: Diagnosis present

## 2017-09-09 DIAGNOSIS — E1165 Type 2 diabetes mellitus with hyperglycemia: Secondary | ICD-10-CM | POA: Diagnosis not present

## 2017-09-09 DIAGNOSIS — Z6829 Body mass index (BMI) 29.0-29.9, adult: Secondary | ICD-10-CM | POA: Insufficient documentation

## 2017-09-09 NOTE — Progress Notes (Signed)
A1C 10 Pt reports not taking her insulin at the same time every night.  Pt states she ended up in the hospital because she was not taking her fluid pill 2 times a day.  Pt does not know how to check her blood sugar and did not bring her supplies with her today.  Pt state her biggest challenge is night time eating.  Pts current living situation: sleeping at a place with bugs in the kitchen so she does not eat there at all and staying with friends and family throughout the day until 9pm. Pt does not keep her insulin in the refrigerator and does not have access to one at night, pt will talk to her doctor about taking her insulin at her aunts house in the morning.  Diabetes Self-Management Education  Visit Type: First/Initial   09/09/2017  [name], identified by name and date of birth, is a 52 y.o. female with a diagnosis of Diabetes: Type 2.   ASSESSMENT  Height 5\' 9"  (1.753 m), weight 196 lb 6.4 oz (89.1 kg). Body mass index is 29 kg/m.  Diabetes Self-Management Education - 09/09/17 0910      Visit Information   Visit Type  First/Initial      Initial Visit   Diabetes Type  Type 2    Are you currently following a meal plan?  No    Are you taking your medications as prescribed?  No    Date Diagnosed  2018      Health Coping   How would you rate your overall health?  Fair      Psychosocial Assessment   Patient Belief/Attitude about Diabetes  Motivated to manage diabetes      Pre-Education Assessment   Patient understands the diabetes disease and treatment process.  Needs Instruction    Patient understands incorporating nutritional management into lifestyle.  Needs Instruction    Patient undertands incorporating physical activity into lifestyle.  Needs Instruction    Patient understands using medications safely.  Needs Instruction    Patient understands monitoring blood glucose, interpreting and using results  Needs Instruction    Patient understands prevention,  detection, and treatment of acute complications.  Needs Instruction    Patient understands prevention, detection, and treatment of chronic complications.  Needs Instruction    Patient understands how to develop strategies to address psychosocial issues.  Needs Instruction    Patient understands how to develop strategies to promote health/change behavior.  Needs Instruction      Complications   Last HgB A1C per patient/outside source  10 %    How often do you check your blood sugar?  0 times/day (not testing)    Have you had a dilated eye exam in the past 12 months?  No    Have you had a dental exam in the past 12 months?  No    Are you checking your feet?  Yes    How many days per week are you checking your feet?  7      Dietary Intake   Breakfast  fruit and cereal    Lunch  sandwich    Snack (afternoon)  granola bar    Dinner  yogurt and fruit and sandwich    Snack (evening)  chips    Beverage(s)  water, juice      Exercise   Exercise Type  Light (walking / raking leaves)    How many days per week to you exercise?  3  How many minutes per day do you exercise?  60    Total minutes per week of exercise  180      Patient Education   Previous Diabetes Education  No    Disease state   Definition of diabetes, type 1 and 2, and the diagnosis of diabetes;Factors that contribute to the development of diabetes    Nutrition management   Role of diet in the treatment of diabetes and the relationship between the three main macronutrients and blood glucose level;Carbohydrate counting;Effects of alcohol on blood glucose and safety factors with consumption of alcohol.;Reviewed blood glucose goals for pre and post meals and how to evaluate the patients' food intake on their blood glucose level.    Physical activity and exercise   Identified with patient nutritional and/or medication changes necessary with exercise.;Role of exercise on diabetes management, blood pressure control and cardiac health.     Medications  Taught/reviewed insulin injection, site rotation, insulin storage and needle disposal.;Reviewed medication adjustment guidelines for hyperglycemia and sick days.;Reviewed patients medication for diabetes, action, purpose, timing of dose and side effects.    Monitoring  Purpose and frequency of SMBG.;Daily foot exams;Yearly dilated eye exam    Acute complications  Taught treatment of hypoglycemia - the 15 rule.;Discussed and identified patients' treatment of hyperglycemia.    Chronic complications  Relationship between chronic complications and blood glucose control;Assessed and discussed foot care and prevention of foot problems;Dental care    Psychosocial adjustment  Role of stress on diabetes;Worked with patient to identify barriers to care and solutions;Travel strategies    Personal strategies to promote health  Lifestyle issues that need to be addressed for better diabetes care;Helped patient develop diabetes management plan for (enter comment)      Individualized Goals (developed by patient)   Nutrition  General guidelines for healthy choices and portions discussed;Follow meal plan discussed;Adjust meds/carbs with exercise as discussed    Physical Activity  Exercise 5-7 days per week;30 minutes per day    Medications  take my medication as prescribed    Monitoring   test my blood glucose as discussed;test blood glucose pre and post meals as discussed      Post-Education Assessment   Patient understands the diabetes disease and treatment process.  Demonstrates understanding / competency    Patient understands incorporating nutritional management into lifestyle.  Demonstrates understanding / competency    Patient undertands incorporating physical activity into lifestyle.  Demonstrates understanding / competency    Patient understands using medications safely.  Demonstrates understanding / competency    Patient understands monitoring blood glucose, interpreting and using results   Demonstrates understanding / competency    Patient understands prevention, detection, and treatment of acute complications.  Demonstrates understanding / competency    Patient understands prevention, detection, and treatment of chronic complications.  Demonstrates understanding / competency    Patient understands how to develop strategies to address psychosocial issues.  Demonstrates understanding / competency    Patient understands how to develop strategies to promote health/change behavior.  Demonstrates understanding / competency      Outcomes   Expected Outcomes  Demonstrated interest in learning. Expect positive outcomes    Future DMSE  PRN    Program Status  Completed       Individualized Plan for Diabetes Self-Management Training:   Learning Objective:  Patient will have a greater understanding of diabetes self-management. Patient education plan is to attend individual and/or group sessions per assessed needs and concerns.   Plan:  There are no Patient Instructions on file for this visit.  Expected Outcomes:  Demonstrated interest in learning. Expect positive outcomes  Education material provided: ADA Diabetes: Your Take Control Guide, Meal plan card, My Plate and Snack sheet  If problems or questions, patient to contact team via:  Phone  Future DSME appointment: PRN

## 2017-09-10 ENCOUNTER — Ambulatory Visit: Payer: Medicaid Other | Attending: Internal Medicine | Admitting: Pharmacist

## 2017-09-10 ENCOUNTER — Encounter: Payer: Self-pay | Admitting: Pharmacist

## 2017-09-10 DIAGNOSIS — Z794 Long term (current) use of insulin: Secondary | ICD-10-CM | POA: Insufficient documentation

## 2017-09-10 DIAGNOSIS — E785 Hyperlipidemia, unspecified: Secondary | ICD-10-CM | POA: Diagnosis not present

## 2017-09-10 DIAGNOSIS — I5040 Unspecified combined systolic (congestive) and diastolic (congestive) heart failure: Secondary | ICD-10-CM | POA: Diagnosis not present

## 2017-09-10 DIAGNOSIS — E119 Type 2 diabetes mellitus without complications: Secondary | ICD-10-CM | POA: Insufficient documentation

## 2017-09-10 DIAGNOSIS — E1165 Type 2 diabetes mellitus with hyperglycemia: Secondary | ICD-10-CM

## 2017-09-10 DIAGNOSIS — Z9114 Patient's other noncompliance with medication regimen: Secondary | ICD-10-CM | POA: Insufficient documentation

## 2017-09-10 DIAGNOSIS — I110 Hypertensive heart disease with heart failure: Secondary | ICD-10-CM | POA: Insufficient documentation

## 2017-09-10 DIAGNOSIS — IMO0001 Reserved for inherently not codable concepts without codable children: Secondary | ICD-10-CM

## 2017-09-10 LAB — GLUCOSE, POCT (MANUAL RESULT ENTRY): POC GLUCOSE: 255 mg/dL — AB (ref 70–99)

## 2017-09-10 MED ORDER — METFORMIN HCL ER 500 MG PO TB24
ORAL_TABLET | ORAL | 2 refills | Status: DC
Start: 2017-09-10 — End: 2018-03-24

## 2017-09-10 MED FILL — metFORMIN HCL ER 500 MG TB2: 500 | 30 days supply | Qty: 120 | Fill #0

## 2017-09-10 NOTE — Progress Notes (Signed)
S:    PCP: Dr. Laural Benes   No chief complaint on file.  Patient arrives in good spirits.  Presents for diabetes management at the request of Dr. Laural Benes. Patient was referred on and last seen by PCP on 08/08/17.  HPI: Patient is a 52 YO female with PMH significant for HTN, combined systolic/diastolic heart failure, dyslipidemia, and T2DM. She was last seen in clinic 08/08/17 by Dr. Laural Benes. At that visit, patient reported non-compliance with home glucose monitoring. Additionally, she reported medication non-compliance; specifically, she was taking Lantus 12-15 units on a PRN basis with metformin 500 mg once daily. She was supposed to be taking 15 units daily of Lantus with 500 mg BID of metformin. She was instructed to do this and follow-up with me.   Family/Social History:  - Reports diabetes in family but relative is unknown - Tobacco: Never smoker - Alcohol: Denies drinking alcohol  Insurance coverage/medication affordability: [place] [employer]  Patient reports adherence with medications.  Current diabetes medications include:  - Lantus 15 units daily - Metformin 500 mg BID  Patient denies hypoglycemic events.  Patient reported dietary habits: Eats 3 meals/day - Pt states eating a breakfast sandwich this morning with white bread, sausage, and cheese around 8:00 AM - Pt was seen by dietitian yesterday (09/09/17).  States that counseling on a diabetic diet was given. Also informed of carbohydrate counting. Glucose goals were reviewed with patient at that encounter.  - She states she has resources to help guide her in her decreasing of carbs and adhering to a diabetic diet.   Patient-reported exercise habits:  - Pt states that she walks for about 20 minutes 3 nights out of the week.    Patient denies nocturia.  Patient denies neuropathy. Patient denies visual changes. Patient denies self foot exams.   O:  Lab Results  Component Value Date   HGBA1C 10.0 (H) 07/19/2017   There were  no vitals filed for this visit.  Lipid Panel     Component Value Date/Time   CHOL 187 05/05/2015 1645   TRIG 131 07/19/2017 1347   HDL 29 (L) 05/05/2015 1645   CHOLHDL 6.4 (H) 05/05/2015 1645   VLDL 37 (H) 05/05/2015 1645   LDLCALC 121 05/05/2015 1645   POCT: 255 Pt not checking home sugars. States that she does not know how to work meter.   Clinical ASCVD: Yes . (prior stroke)  A/P: Diabetes longstanding currently uncontrolled based on A1c of 10. Patient is able to verbalize appropriate hypoglycemia management plan. Patient is adherent with medication. Control is suboptimal due to dietary indiscretion.   Pt is encouraged with dietician encounter/recommendations. I emphasized 150 mins/week of aerobic exercise. Emphasized continued medication compliance. [name] has been instructed to monitor home sugar levels but has been unable to operate her meter. I have educated her on the use of the Accu-Chek blood glucose meter. Reviewed necessary supplies and operation of the meter. She was able to demonstrate proper use.   For now, will have patient increase metformin from 500 mg BID to 1000 mg BID. I have emphasized that we will need more home readings before titrating insulin. Patient is agreeable to this along with lifestyle change in an attempt to achieve better control.    -Continued Lantus 15 units daily.  -Increased dose of metformin from 500 mg BID to 1000 mg BID (2 of the 500 mg tablets BID).  -Extensively discussed pathophysiology of DM, recommended lifestyle interventions, dietary effects on glycemic control -Counseled on  s/sx of and management of hypoglycemia -Next A1C anticipated 10/2017.  -Pt with microalbuminuria. Reports hives and swelling with lisinopril in the past. This is documented in [facility].    ASCVD risk - secondary prevention in patient with DM. Last LDL > 100. High intensity statin indicated. Aspirin is indicated.  -Continued aspirin 81 mg  -Continued atorvastatin 40  mg (Patient takes 1/2 of 80 mg tablet).    Written patient instructions provided.  Total time in face to face counseling 15 minutes.   Follow up Pharmacist Clinic Visit 09/26/17.   Butch Penny, PharmD, CPP Clinical Pharmacist [facility] [phone]

## 2017-09-10 NOTE — Patient Instructions (Signed)
Thank you for coming to see me today. Please do the following:  1. Increase metformin to 2 tablets 2 times a day as directed today during your appointment. If you have any questions or if you believe something has occurred because of this change, call me or your doctor to let one of [place] know.  2. Continue checking blood sugars at home. It's really important that you record these and bring these in to your next doctor's appointment. If you get in readings above 500 or lower than 70, call me or the clinic to let your doctor know. See below on how to treat low blood sugar.  3. Continue making the lifestyle changes we've discussed together during our visit. Diet and exercise play a significant role in improving your blood sugars.  4. Follow-up with me in 2 weeks.    Hypoglycemia or low blood sugar:   Low blood sugar can happen quickly and may become an emergency if not treated right away.   While this shouldn't happen often, it can be brought upon if you skip a meal or do not eat enough. Also, if your insulin or other diabetes medications are dosed too high, this can cause your blood sugar to go to low.   Warning signs of low blood sugar include: 1. Feeling shaky or dizzy 2. Feeling weak or tired  3. Excessive hunger 4. Feeling anxious or upset  5. Sweating even when you aren't exercising  What to do if I experience low blood sugar? 1. Check your blood sugar with your meter. If lower than 70, proceed to step 2.  2. Treat with 3-4 glucose tablets or 3 packets of regular sugar. If these aren't around, you can try hard candy. Yet another option would be to drink 4 ounces of fruit juice or 6 ounces of REGULAR soda.  3. Re-check your sugar in 15 minutes. If it is still below 70, do what you did in step 2 again. If has come back up, go ahead and eat a snack or small meal at this time.

## 2017-09-12 ENCOUNTER — Ambulatory Visit (HOSPITAL_COMMUNITY): Admit: 2017-09-12 | Payer: Medicaid Other | Admitting: Internal Medicine

## 2017-09-12 ENCOUNTER — Encounter (HOSPITAL_COMMUNITY): Payer: Self-pay

## 2017-09-12 SURGERY — ICD IMPLANT

## 2017-09-17 MED FILL — TRUEPLUS PEN NDL 31G X 1/4: 31G X 6 MM | 25 days supply | Qty: 100 | Fill #1

## 2017-09-17 MED FILL — TRUEPLUS PEN NDL 31G X 1/4": 31G X 6 MM | 25 days supply | Qty: 100 | Fill #1

## 2017-09-24 ENCOUNTER — Ambulatory Visit: Payer: Medicaid Other | Admitting: Physician Assistant

## 2017-09-25 ENCOUNTER — Ambulatory Visit: Payer: Medicaid Other

## 2017-09-26 ENCOUNTER — Ambulatory Visit: Payer: Medicaid Other | Admitting: Pharmacist

## 2017-10-08 ENCOUNTER — Ambulatory Visit: Payer: Medicaid Other | Admitting: Internal Medicine

## 2017-10-16 ENCOUNTER — Ambulatory Visit
Admission: RE | Admit: 2017-10-16 | Discharge: 2017-10-16 | Disposition: A | Discharge status: Home / Self Care | Payer: Medicaid Other | Source: Ambulatory Visit | Attending: Internal Medicine | Admitting: Internal Medicine

## 2017-10-16 DIAGNOSIS — Z1239 Encounter for other screening for malignant neoplasm of breast: Secondary | ICD-10-CM

## 2017-12-11 ENCOUNTER — Other Ambulatory Visit: Payer: Self-pay | Admitting: Internal Medicine

## 2017-12-11 DIAGNOSIS — IMO0001 Reserved for inherently not codable concepts without codable children: Secondary | ICD-10-CM

## 2017-12-11 DIAGNOSIS — Z794 Long term (current) use of insulin: Principal | ICD-10-CM

## 2017-12-11 DIAGNOSIS — E1165 Type 2 diabetes mellitus with hyperglycemia: Principal | ICD-10-CM

## 2017-12-11 MED FILL — TRUEPLUS PEN NDL 31G X 1/4": 31G X 6 MM | 25 days supply | Qty: 100 | Fill #0

## 2017-12-11 MED FILL — TRUEPLUS PEN NDL 31G X 1/4: 31G X 6 MM | 25 days supply | Qty: 100 | Fill #0

## 2017-12-11 MED FILL — metFORMIN HCL ER 500 MG TB2: 500 | 30 days supply | Qty: 120 | Fill #1

## 2017-12-18 ENCOUNTER — Encounter: Payer: Medicaid Other | Admitting: Internal Medicine

## 2017-12-31 NOTE — Progress Notes (Deleted)
HPI: FUCM/CHF. Echocardiogram in April of 2013 showed an ejection fraction of 35%, moderate left ventricular enlargement, mild to moderate mitral regurgitation. HIV negative and TSH normal. Cardiac catheterization in April of 2013 showed normal coronary arteries and an ejection fraction of 35-40%. Patient was treated with medications and her cardiomyopathy was felt possibly secondary to hypertension. Echocardiogram repeated in September of 2013. Her ejection fraction was 30-35%. Patient referred to Dr. Rayann Heman for consideration of ICD but the patient decided not to pursue. Also with h/o carotid dissection healed on CTA 4/18. Echocardiogram repeated April 2019 and showed ejection fraction 25 to 30%, moderate to severe mitral regurgitation, biatrial enlargement and mildly reduced LV function.  Patient recently discharged following admission for pneumonia and congestive heart failure.  She required intubation during that admission.  She apparently had been noncompliant with Lasix.  At last office visit we arranged evaluation with Dr. Rayann Heman for ICD implantation.  Procedure was scheduled but patient canceled.  Since last seen,   Current Outpatient Medications  Medication Sig Dispense Refill  . albuterol (PROVENTIL HFA;VENTOLIN HFA) 108 (90 Base) MCG/ACT inhaler Inhale 2 puffs into the lungs every 6 (six) hours as needed for wheezing or shortness of breath. 1 Inhaler 1  . aspirin 81 MG chewable tablet Chew 1 tablet (81 mg total) by mouth daily. 30 tablet 3  . atorvastatin (LIPITOR) 80 MG tablet Take 0.5 tablets (40 mg total) by mouth every evening. 30 tablet 11  . blood glucose meter kit and supplies Dispense based on patient and insurance preference. Use up to four times daily as directed. (FOR ICD-9 250.00, 250.01). 1 each 0  . Blood Glucose Monitoring Suppl (ACCU-CHEK AVIVA PLUS) w/Device KIT 1 Device by Does not apply route 3 (three) times daily. 1 kit 0  . carvedilol (COREG) 6.25 MG tablet Take 1  tablet (6.25 mg total) by mouth 2 (two) times daily with a meal. 60 tablet 11  . furosemide (LASIX) 40 MG tablet Take 1 tablet (40 mg total) by mouth 2 (two) times daily. For Fluid/Heart Failure 60 tablet 11  . glucose blood (ACCU-CHEK AVIVA) test strip Use as instructed 100 each 12  . hydrALAZINE (APRESOLINE) 25 MG tablet Take 1 tablet (25 mg total) by mouth 3 (three) times daily. 90 tablet 4  . Insulin Glargine (LANTUS SOLOSTAR) 100 UNIT/ML Solostar Pen Inject 15 Units into the skin at bedtime. 10 mL 3  . isosorbide mononitrate (IMDUR) 30 MG 24 hr tablet Take 1 tablet (30 mg total) by mouth daily. 30 tablet 11  . Lancets (ACCU-CHEK SOFT TOUCH) lancets Use as instructed 100 each 12  . metFORMIN (GLUCOPHAGE-XR) 500 MG 24 hr tablet Take 2 tablets by mouth in the morning and 2 tablets by mouth in the evening. 120 tablet 2  . Potassium Chloride ER 20 MEQ TBCR Take 20 mEq by mouth daily. 1 tab daily by mouth 30 tablet 1  . spironolactone (ALDACTONE) 25 MG tablet Take 1 tablet (25 mg total) by mouth daily. 90 tablet 3  . TRUEPLUS PEN NEEDLES 31G X 6 MM MISC USE AS DIRECTED 100 each 1   No current facility-administered medications for this visit.      Past Medical History:  Diagnosis Date  . Asthma 05/31/2017  . Diabetes mellitus, type 2 (HCC)    A1C 7.6 April '13  . Hypertension   . Left leg pain 07/21/2015  . Nonischemic cardiomyopathy ([place])    Echo 05/19/11 EF 35% w/ mild-mod MR; R/L  Heart Cath 05/21/11 - mildly elevated right heart pressures, normal left heart pressures, preserved cardiac output, widely patent coronary arteries without significant CAD and moderate global systolic LV dysfunction, EF 35-40%.  . 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 Systolic CHF (HCC)    Echo 05/19/11 EF 35% w/ mild-mod MR    Past Surgical History:  Procedure Laterality Date  . CERVIX LESION DESTRUCTION    . DILATION AND CURETTAGE OF UTERUS    . LEFT HEART CATHETERIZATION WITH  CORONARY ANGIOGRAM N/A 05/21/2011   Procedure: LEFT HEART CATHETERIZATION WITH CORONARY ANGIOGRAM;  Surgeon: Michael Cooper, MD;  Location: [facility] CATH LAB;  Service: Cardiovascular;  Laterality: N/A;  . TUBAL LIGATION  1996    Social History   Socioeconomic History  . Marital status: Divorced    Spouse name: Not on file  . Number of children: 2  . Years of education: Not on file  . Highest education level: Not on file  Occupational History  . Not on file  Social Needs  . Financial resource strain: Not on file  . Food insecurity:    Worry: Not on file    Inability: Not on file  . Transportation needs:    Medical: Not on file    Non-medical: Not on file  Tobacco Use  . Smoking status: Never Smoker  . Smokeless tobacco: Never Used  Substance and Sexual Activity  . Alcohol use: No  . Drug use: No  . Sexual activity: Yes    Birth control/protection: None  Lifestyle  . Physical activity:    Days per week: Not on file    Minutes per session: Not on file  . Stress: Not on file  Relationships  . Social connections:    Talks on phone: Not on file    Gets together: Not on file    Attends religious service: Not on file    Active member of club or organization: Not on file    Attends meetings of clubs or organizations: Not on file    Relationship status: Not on file  . Intimate partner violence:    Fear of current or ex partner: Not on file    Emotionally abused: Not on file    Physically abused: Not on file    Forced sexual activity: Not on file  Other Topics Concern  . Not on file  Social History Narrative   Lives in [place].  Presently unemployed but attends school full time    Family History  Problem Relation Age of Onset  . Diabetes Unknown        multiple  . Heart failure Paternal Grandmother   . Breast cancer Paternal Grandmother   . Heart disease Unknown        multiple  . Breast cancer Mother     ROS: no fevers or chills, productive cough, hemoptysis,  dysphasia, odynophagia, melena, hematochezia, dysuria, hematuria, rash, seizure activity, orthopnea, PND, pedal edema, claudication. Remaining systems are negative.  Physical Exam: Well-developed well-nourished in no acute distress.  Skin is warm and dry.  HEENT is normal.  Neck is supple.  Chest is clear to auscultation with normal expansion.  Cardiovascular exam is regular rate and rhythm.  Abdominal exam nontender or distended. No masses palpated. Extremities show no edema. neuro grossly intact  ECG- personally reviewed  A/P  1  Brian Crenshaw, MD     

## 2018-01-13 ENCOUNTER — Ambulatory Visit: Payer: Medicaid Other | Admitting: Cardiology

## 2018-01-16 LAB — BLOOD GAS, VENOUS
ACID-BASE DEFICIT: 1.6 mmol/L (ref 0.0–2.0)
BICARBONATE: 21.1 mmol/L (ref 20.0–28.0)
DRAWN BY: 51425
FIO2: 21
O2 SAT: 81 %
PH VEN: 7.444 — AB (ref 7.250–7.430)
Patient temperature: 98.6
pCO2, Ven: 31.3 mmHg — ABNORMAL LOW (ref 44.0–60.0)
pO2, Ven: 47.7 mmHg — ABNORMAL HIGH (ref 32.0–45.0)

## 2018-02-07 LAB — HM DIABETES EYE EXAM

## 2018-03-16 IMAGING — CR DG CHEST 2V
2 series · 2 of 2 positions shown · non-contrast
Comparison: Chest CT 06/10/2016 and chest radiograph 06/10/2016

CLINICAL DATA: Cough

EXAM:
CHEST  2 VIEW

[w chest pa]
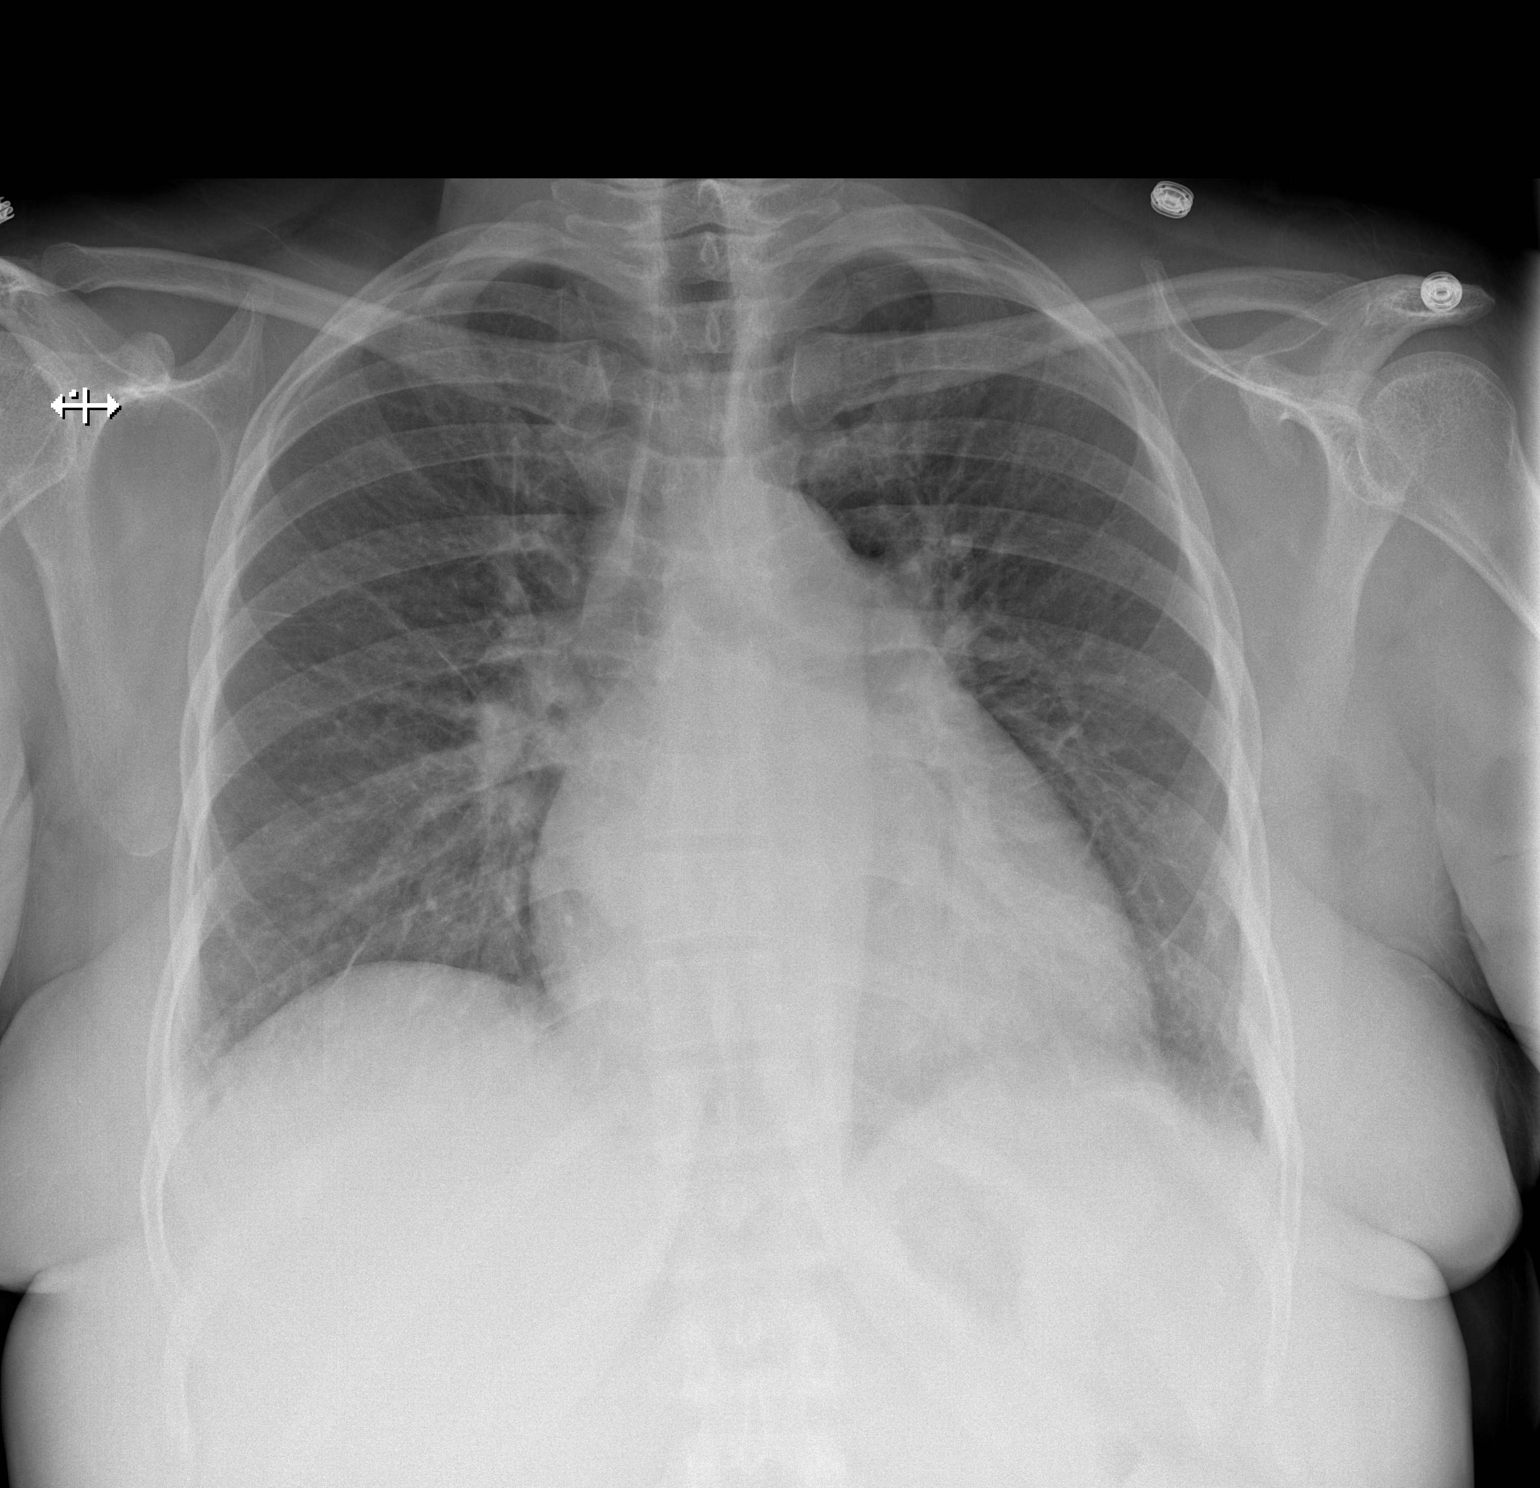

[w chest lat]
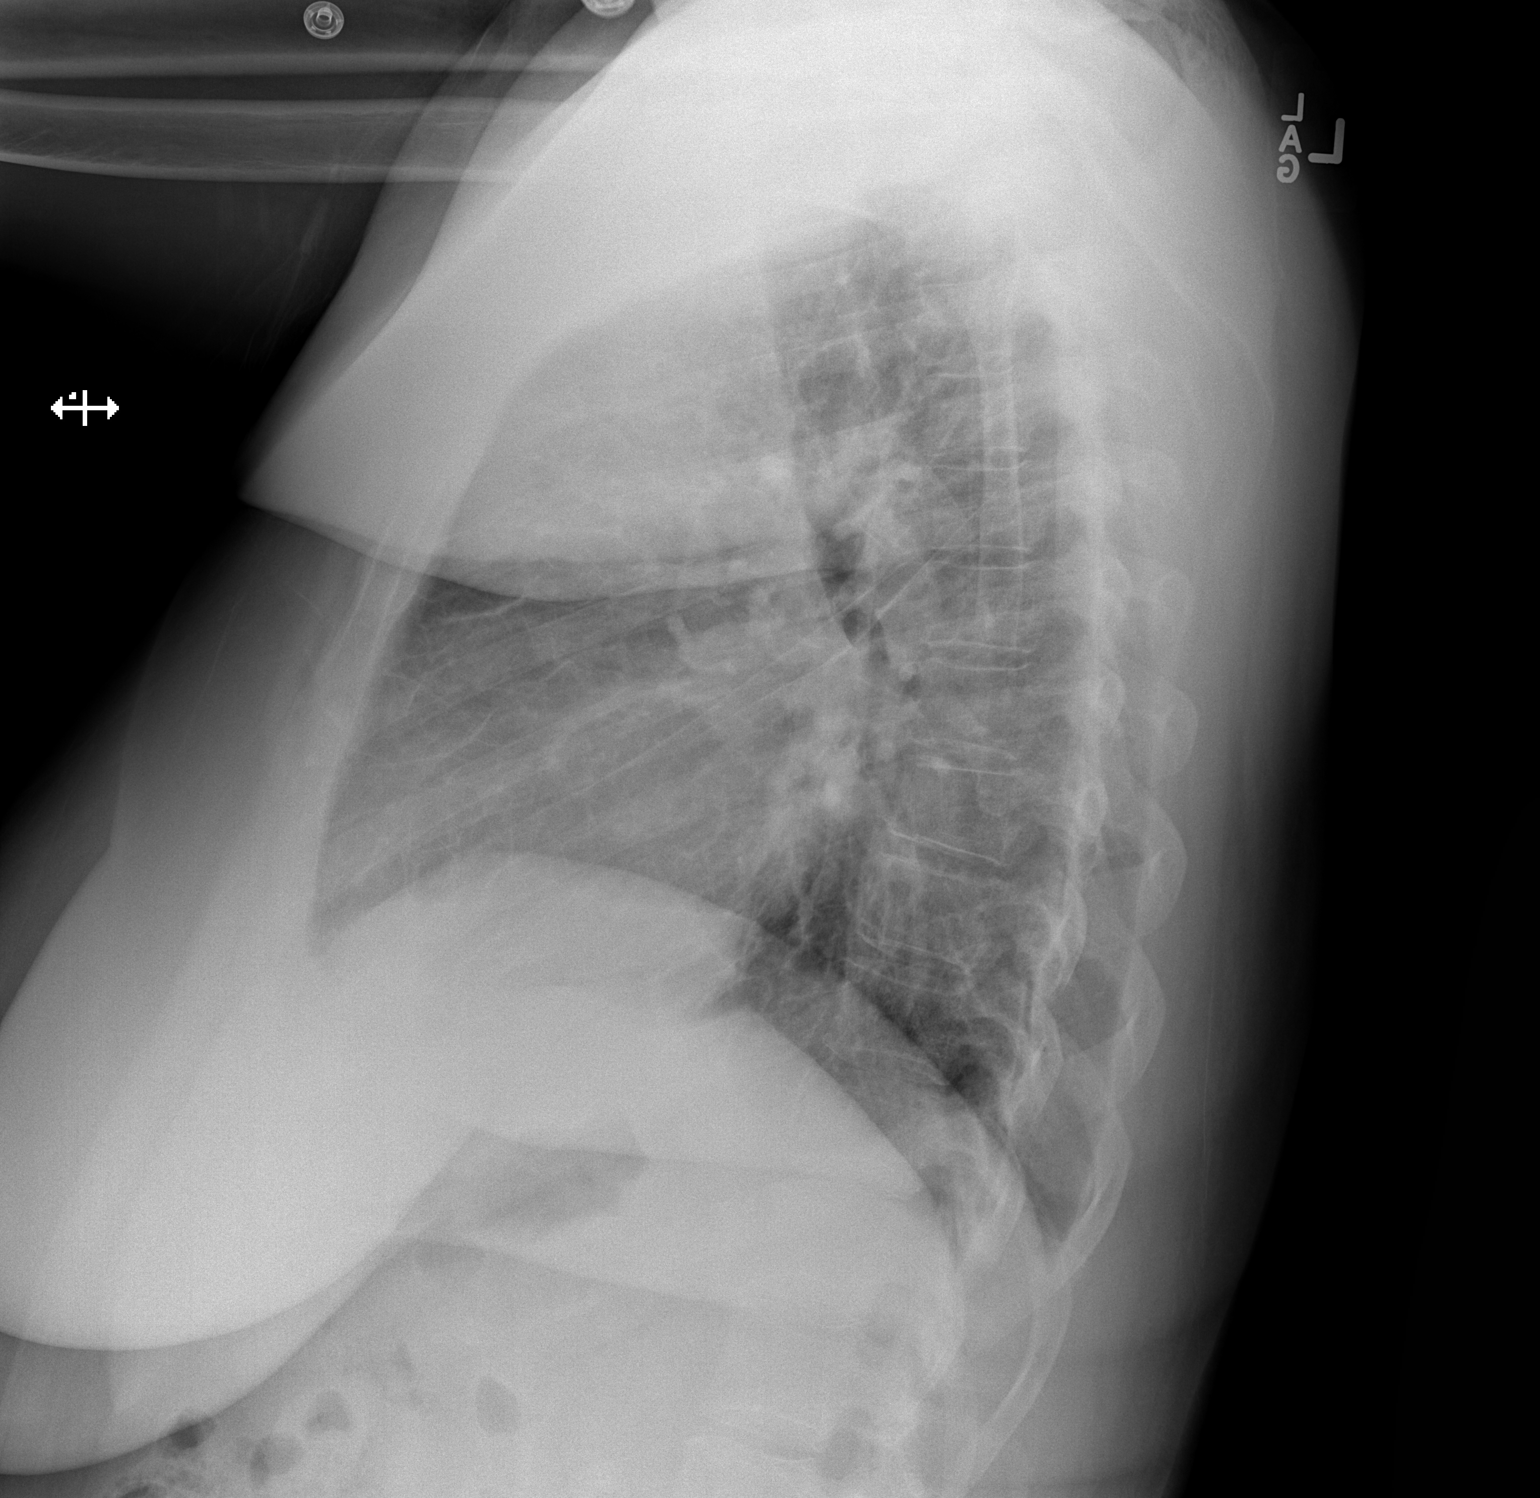

[2 of 2 positions shown; findings below may reference images not displayed]

FINDINGS: Cardiomegaly no focal airspace consolidation or pulmonary edema. No
pleural effusion or pneumothorax.
IMPRESSION: Cardiomegaly without focal airspace disease.  No pulmonary edema.

## 2018-03-17 ENCOUNTER — Other Ambulatory Visit: Payer: Self-pay | Admitting: Internal Medicine

## 2018-03-17 DIAGNOSIS — E1165 Type 2 diabetes mellitus with hyperglycemia: Secondary | ICD-10-CM

## 2018-03-17 DIAGNOSIS — IMO0002 Reserved for concepts with insufficient information to code with codable children: Secondary | ICD-10-CM

## 2018-03-17 DIAGNOSIS — E118 Type 2 diabetes mellitus with unspecified complications: Principal | ICD-10-CM

## 2018-03-17 MED FILL — LANTUS SOLOSTAR 100 UNITS/M: 100 | 18 days supply | Qty: 3 | Fill #0

## 2018-03-24 ENCOUNTER — Encounter: Payer: Self-pay | Admitting: Pharmacist

## 2018-03-24 ENCOUNTER — Ambulatory Visit: Payer: Medicaid Other | Admitting: Pharmacist

## 2018-03-24 ENCOUNTER — Other Ambulatory Visit: Payer: Self-pay | Admitting: Internal Medicine

## 2018-03-24 ENCOUNTER — Ambulatory Visit: Payer: Medicaid Other | Attending: Internal Medicine | Admitting: Internal Medicine

## 2018-03-24 ENCOUNTER — Encounter: Payer: Self-pay | Admitting: Internal Medicine

## 2018-03-24 VITALS — BP 143/92 | HR 91 | Temp 97.9°F | Resp 16 | Ht 69.0 in | Wt 212.4 lb

## 2018-03-24 DIAGNOSIS — E785 Hyperlipidemia, unspecified: Secondary | ICD-10-CM

## 2018-03-24 DIAGNOSIS — Z833 Family history of diabetes mellitus: Secondary | ICD-10-CM | POA: Insufficient documentation

## 2018-03-24 DIAGNOSIS — Z8249 Family history of ischemic heart disease and other diseases of the circulatory system: Secondary | ICD-10-CM | POA: Insufficient documentation

## 2018-03-24 DIAGNOSIS — Z803 Family history of malignant neoplasm of breast: Secondary | ICD-10-CM | POA: Insufficient documentation

## 2018-03-24 DIAGNOSIS — Z5900 Homelessness unspecified: Secondary | ICD-10-CM

## 2018-03-24 DIAGNOSIS — M546 Pain in thoracic spine: Secondary | ICD-10-CM | POA: Diagnosis not present

## 2018-03-24 DIAGNOSIS — Z79899 Other long term (current) drug therapy: Secondary | ICD-10-CM | POA: Diagnosis not present

## 2018-03-24 DIAGNOSIS — Z888 Allergy status to other drugs, medicaments and biological substances status: Secondary | ICD-10-CM | POA: Diagnosis not present

## 2018-03-24 DIAGNOSIS — IMO0002 Reserved for concepts with insufficient information to code with codable children: Secondary | ICD-10-CM

## 2018-03-24 DIAGNOSIS — E1142 Type 2 diabetes mellitus with diabetic polyneuropathy: Secondary | ICD-10-CM | POA: Insufficient documentation

## 2018-03-24 DIAGNOSIS — I130 Hypertensive heart and chronic kidney disease with heart failure and stage 1 through stage 4 chronic kidney disease, or unspecified chronic kidney disease: Secondary | ICD-10-CM | POA: Diagnosis not present

## 2018-03-24 DIAGNOSIS — Z794 Long term (current) use of insulin: Secondary | ICD-10-CM | POA: Diagnosis not present

## 2018-03-24 DIAGNOSIS — I10 Essential (primary) hypertension: Secondary | ICD-10-CM | POA: Diagnosis not present

## 2018-03-24 DIAGNOSIS — I420 Dilated cardiomyopathy: Secondary | ICD-10-CM | POA: Diagnosis not present

## 2018-03-24 DIAGNOSIS — Z886 Allergy status to analgesic agent status: Secondary | ICD-10-CM | POA: Diagnosis not present

## 2018-03-24 DIAGNOSIS — E118 Type 2 diabetes mellitus with unspecified complications: Principal | ICD-10-CM

## 2018-03-24 DIAGNOSIS — Z95810 Presence of automatic (implantable) cardiac defibrillator: Secondary | ICD-10-CM | POA: Diagnosis not present

## 2018-03-24 DIAGNOSIS — I5043 Acute on chronic combined systolic (congestive) and diastolic (congestive) heart failure: Secondary | ICD-10-CM | POA: Diagnosis not present

## 2018-03-24 DIAGNOSIS — E1122 Type 2 diabetes mellitus with diabetic chronic kidney disease: Secondary | ICD-10-CM | POA: Insufficient documentation

## 2018-03-24 DIAGNOSIS — N183 Chronic kidney disease, stage 3 (moderate): Secondary | ICD-10-CM | POA: Diagnosis not present

## 2018-03-24 DIAGNOSIS — Z7982 Long term (current) use of aspirin: Secondary | ICD-10-CM | POA: Insufficient documentation

## 2018-03-24 DIAGNOSIS — Z882 Allergy status to sulfonamides status: Secondary | ICD-10-CM | POA: Diagnosis not present

## 2018-03-24 DIAGNOSIS — H9193 Unspecified hearing loss, bilateral: Secondary | ICD-10-CM

## 2018-03-24 DIAGNOSIS — G8929 Other chronic pain: Secondary | ICD-10-CM | POA: Diagnosis not present

## 2018-03-24 DIAGNOSIS — E1165 Type 2 diabetes mellitus with hyperglycemia: Secondary | ICD-10-CM | POA: Diagnosis not present

## 2018-03-24 LAB — POCT URINALYSIS DIP (CLINITEK)
BILIRUBIN UA: NEGATIVE mg/dL
Bilirubin, UA: NEGATIVE
Leukocytes, UA: NEGATIVE
Nitrite, UA: NEGATIVE
POC PROTEIN,UA: NEGATIVE
Urobilinogen, UA: 0.2 E.U./dL
pH, UA: 5 (ref 5.0–8.0)

## 2018-03-24 LAB — GLUCOSE, POCT (MANUAL RESULT ENTRY): POC Glucose: 410 mg/dl — AB (ref 70–99)

## 2018-03-24 LAB — POCT GLYCOSYLATED HEMOGLOBIN (HGB A1C): HbA1c, POC (controlled diabetic range): 11 % — AB (ref 0.0–7.0)

## 2018-03-24 MED ORDER — ATORVASTATIN CALCIUM 80 MG PO TABS: 40.0000 mg | ORAL_TABLET | Freq: Every evening | ORAL | 11 refills | Status: DC

## 2018-03-24 MED ORDER — DULAGLUTIDE 0.75 MG/0.5ML ~~LOC~~ SOAJ: 0.7500 mg | SUBCUTANEOUS | 11 refills | Status: DC

## 2018-03-24 MED ORDER — ISOSORBIDE MONONITRATE ER 30 MG PO TB24: 30.0000 mg | ORAL_TABLET | Freq: Every day | ORAL | 11 refills | Status: DC

## 2018-03-24 MED ORDER — ACCU-CHEK AVIVA PLUS W/DEVICE KIT: 1.0000 | PACK | Freq: Three times a day (TID) | 0 refills | Status: DC

## 2018-03-24 MED ORDER — GLUCOSE BLOOD VI STRP: ORAL_STRIP | 12 refills | Status: DC

## 2018-03-24 MED ORDER — INSULIN GLARGINE 100 UNIT/ML SOLOSTAR PEN: PEN_INJECTOR | SUBCUTANEOUS | 2 refills | Status: DC

## 2018-03-24 MED ORDER — ACCU-CHEK SOFT TOUCH LANCETS MISC: 12 refills | Status: DC

## 2018-03-24 MED ORDER — METFORMIN HCL ER 500 MG PO TB24: ORAL_TABLET | ORAL | 2 refills | Status: DC

## 2018-03-24 MED ORDER — METFORMIN HCL ER 500 MG PO TB24: 500.0000 mg | ORAL_TABLET | Freq: Every day | ORAL | 2 refills | Status: DC

## 2018-03-24 NOTE — Progress Notes (Signed)
Patient ID: [name], female    DOB: [birth year]  MRN: [id number]  CC: Diabetes and Hypertension   Subjective: [name] is a 53 y.o. female who presents for chronic ds management.  Last seen 07/2017 Her concerns today include:  DM with neuropathy. DCM/CHF with EF25% on 05/2016, HL, lung nodules, HTN, hx of carotid dissection, CKD 2-3, mild anemia  DCM/CHF/HTN:  Was scheduled to have ICD 09/2017 but defer due to unstable housing.   No CP/SOB/LE edema Reports compliance with medications but has been out of isosorbide. She also does not have Lipitor  Homeless:  She was living out of her car for sometime but now with a female friend.  Goes to counseling two x a wk with [employer] [facility] the [place].  She is on housing list.  She has enlisted the help of Scientist, research (life sciences).  Working on getting disability.  Ears feel stopped up for past 3 mths.  "Feels like I'm under water."  -ears feel uncomfortable inside.  No drainage  C/o pain in the upper to center of back x several mths.  Worse over the past 3 mths.  Pain in the mid line Takes Tylenol 355m 2 tabs BID every day.   No numbness or tingling FGolden Circleoff a bed 04/2017 onto LT knee.  LT knee and back have bothered her since then Some cramps in RT foot Tried to work at [employer] October but had to quit after 3 mths because she was having difficulty standing for 8 hrs  DM:  Last time she check BS was 12/2017.  Missed placed her testing supplies.  Would like rxn for new one Med:  Was only taking 8 units of Lantus; suppose to be 16 units.  Pt states she forgot she was suppose to be on 16 units.  Also only taking Metformin 500 mg daily instead of 1 gram BID the higher dose causes diarrhea for her. She feels her eating habit is okay. She does a lot of walking to and from the bus stop over the past month since her car gave out Patient Active Problem List   Diagnosis Date Noted  . Homeless 03/24/2018  . Microalbuminuria 08/11/2017  .  Anemia 08/08/2017  . Physical deconditioning   . Hyperlipidemia 02/21/2017  . Diabetic polyneuropathy associated with type 2 diabetes mellitus ([place] 02/21/2017  . Lung nodules 07/16/2016  . Uncontrolled type 2 diabetes mellitus without complication, with long-term current use of insulin ([place] 07/16/2016  . DCM (dilated cardiomyopathy) ([place]   . Acute on chronic combined systolic and diastolic CHF (congestive heart failure) ([place] 06/10/2016  . Essential hypertension 06/10/2016  . Dyslipidemia associated with type 2 diabetes mellitus ([place] 06/10/2016     Current Outpatient Medications on File Prior to Visit  Medication Sig Dispense Refill  . albuterol (PROVENTIL HFA;VENTOLIN HFA) 108 (90 Base) MCG/ACT inhaler Inhale 2 puffs into the lungs every 6 (six) hours as needed for wheezing or shortness of breath. 1 Inhaler 1  . aspirin 81 MG chewable tablet Chew 1 tablet (81 mg total) by mouth daily. 30 tablet 3  . blood glucose meter kit and supplies Dispense based on patient and insurance preference. Use up to four times daily as directed. (FOR ICD-9 250.00, 250.01). 1 each 0  . carvedilol (COREG) 6.25 MG tablet Take 1 tablet (6.25 mg total) by mouth 2 (two) times daily with a meal. 60 tablet 11  . furosemide (LASIX) 40 MG tablet Take 1 tablet (40 mg total) by  mouth 2 (two) times daily. For Fluid/Heart Failure 60 tablet 11  . hydrALAZINE (APRESOLINE) 25 MG tablet Take 1 tablet (25 mg total) by mouth 3 (three) times daily. 90 tablet 4  . Potassium Chloride ER 20 MEQ TBCR Take 20 mEq by mouth daily. 1 tab daily by mouth (Patient not taking: Reported on 03/24/2018) 30 tablet 1  . spironolactone (ALDACTONE) 25 MG tablet Take 1 tablet (25 mg total) by mouth daily. 90 tablet 3  . TRUEPLUS PEN NEEDLES 31G X 6 MM MISC USE AS DIRECTED 100 each 1   No current facility-administered medications on file prior to visit.     Allergies  Allergen Reactions  . Lisinopril Hives and Swelling    Facial   .  Sertraline Hives and Swelling    Facial   . Tramadol Hives and Swelling    facial  . Ibuprofen Hives and Swelling    Social History   Socioeconomic History  . Marital status: Divorced    Spouse name: Not on file  . Number of children: 2  . Years of education: Not on file  . Highest education level: Not on file  Occupational History  . Not on file  Social Needs  . Financial resource strain: Not on file  . Food insecurity:    Worry: Not on file    Inability: Not on file  . Transportation needs:    Medical: Not on file    Non-medical: Not on file  Tobacco Use  . Smoking status: Never Smoker  . Smokeless tobacco: Never Used  Substance and Sexual Activity  . Alcohol use: No  . Drug use: No  . Sexual activity: Yes    Birth control/protection: None  Lifestyle  . Physical activity:    Days per week: Not on file    Minutes per session: Not on file  . Stress: Not on file  Relationships  . Social connections:    Talks on phone: Not on file    Gets together: Not on file    Attends religious service: Not on file    Active member of club or organization: Not on file    Attends meetings of clubs or organizations: Not on file    Relationship status: Not on file  . Intimate partner violence:    Fear of current or ex partner: Not on file    Emotionally abused: Not on file    Physically abused: Not on file    Forced sexual activity: Not on file  Other Topics Concern  . Not on file  Social History Narrative   Lives in [place].  Presently unemployed but attends school full time    Family History  Problem Relation Age of Onset  . Diabetes Unknown        multiple  . Heart failure Paternal Grandmother   . Breast cancer Paternal Grandmother   . Heart disease Unknown        multiple  . Breast cancer Mother     Past Surgical History:  Procedure Laterality Date  . CERVIX LESION DESTRUCTION    . DILATION AND CURETTAGE OF UTERUS    . LEFT HEART CATHETERIZATION WITH  CORONARY ANGIOGRAM N/A 05/21/2011   Procedure: LEFT HEART CATHETERIZATION WITH CORONARY ANGIOGRAM;  Surgeon: Sherren Mocha, MD;  Location: [facility] CATH LAB;  Service: Cardiovascular;  Laterality: N/A;  . TUBAL LIGATION  1996    ROS: Review of Systems Negative except as stated above  PHYSICAL EXAM: BP (!) 143/92  Pulse 91   Temp 97.9 F (36.6 C) (Oral)   Resp 16   Ht 5' 9"  (1.753 m)   Wt 212 lb 6.4 oz (96.3 kg)   LMP 03/22/2018   SpO2 99%   BMI 31.37 kg/m   Wt Readings from Last 3 Encounters:  03/24/18 212 lb 6.4 oz (96.3 kg)  09/09/17 196 lb 6.4 oz (89.1 kg)  08/12/17 198 lb (89.8 kg)   Physical Exam  General appearance - alert, well appearing, and in no distress Mental status - normal mood, behavior, speech, dress, motor activity, and thought processes Ears - bilateral TM's and external ear canals normal Mouth - mucous membranes moist, pharynx normal without lesions Neck - supple, no significant adenopathy Chest - clear to auscultation, no wheezes, rales or rhonchi, symmetric air entry Heart - normal rate, regular rhythm, normal S1, S2, no murmurs, rubs, clicks or gallops Extremities - peripheral pulses normal, no pedal edema, no clubbing or cyanosis MSK: Left knee: No joint enlargement.  No point tenderness.  Mild crepitus and clicking on passive range of motion. No tenderness on palpation of the trapezius muscle on either side.  No tenderness on palpation of the cervical spine.  Mild tenderness on palpation of the mid thoracic vertebrae.  No tenderness on palpation of the thoracic paraspinal muscles Diabetic Foot Exam - Simple   Simple Foot Form Visual Inspection No deformities, no ulcerations, no other skin breakdown bilaterally:  Yes Sensation Testing Intact to touch and monofilament testing bilaterally:  Yes Pulse Check Posterior Tibialis and Dorsalis pulse intact bilaterally:  Yes Comments     CMP Latest Ref Rng & Units 07/31/2017 07/26/2017 07/25/2017  Glucose 65 -  99 mg/dL 156(H) 142(H) 128(H)  BUN 6 - 24 mg/dL 6 10 14   Creatinine 0.57 - 1.00 mg/dL 1.16(H) 1.37(H) 1.44(H)  Sodium 134 - 144 mmol/L 139 139 141  Potassium 3.5 - 5.2 mmol/L 4.0 3.0(L) 2.8(L)  Chloride 96 - 106 mmol/L 97 103 101  CO2 20 - 29 mmol/L 25 27 27   Calcium 8.7 - 10.2 mg/dL 9.2 8.5(L) 8.7(L)  Total Protein 6.0 - 8.5 g/dL 7.0 - -  Total Bilirubin 0.0 - 1.2 mg/dL 0.8 - -  Alkaline Phos 39 - 117 IU/L 69 - -  AST 0 - 40 IU/L 9 - -  ALT 0 - 32 IU/L 7 - -   Lipid Panel     Component Value Date/Time   CHOL 187 05/05/2015 1645   TRIG 131 07/19/2017 1347   HDL 29 (L) 05/05/2015 1645   CHOLHDL 6.4 (H) 05/05/2015 1645   VLDL 37 (H) 05/05/2015 1645   LDLCALC 121 05/05/2015 1645   Lab Results  Component Value Date   HGBA1C 11.0 (A) 03/24/2018    CBC    Component Value Date/Time   WBC 8.3 07/31/2017 1225   WBC 8.9 07/25/2017 0837   RBC 3.90 07/31/2017 1225   RBC 3.71 (L) 07/25/2017 0837   HGB 10.4 (L) 07/31/2017 1225   HCT 33.4 (L) 07/31/2017 1225   PLT 555 (H) 07/31/2017 1225   MCV 86 07/31/2017 1225   MCH 26.7 07/31/2017 1225   MCH 26.4 07/25/2017 0837   MCHC 31.1 (L) 07/31/2017 1225   MCHC 30.6 07/25/2017 0837   RDW 16.0 (H) 07/31/2017 1225   LYMPHSABS 2.5 07/31/2017 1225   MONOABS 0.6 06/02/2017 0530   EOSABS 0.2 07/31/2017 1225   BASOSABS 0.1 07/31/2017 1225    Results for orders placed or performed in visit on 03/24/18  POCT glucose (manual entry)  Result Value Ref Range   POC Glucose 410 (A) 70 - 99 mg/dl  POCT glycosylated hemoglobin (Hb A1C)  Result Value Ref Range   Hemoglobin A1C     HbA1c POC (<> result, manual entry)     HbA1c, POC (prediabetic range)     HbA1c, POC (controlled diabetic range) 11.0 (A) 0.0 - 7.0 %  POCT URINALYSIS DIP (CLINITEK)  Result Value Ref Range   Color, UA light yellow (A) yellow   Clarity, UA clear clear   Glucose, UA =500 (A) negative mg/dL   Bilirubin, UA negative negative   Ketones, POC UA negative negative mg/dL    Spec Grav, UA <=1.005 (A) 1.010 - 1.025   Blood, UA large (A) negative   pH, UA 5.0 5.0 - 8.0   POC PROTEIN,UA negative negative, trace   Urobilinogen, UA 0.2 0.2 or 1.0 E.U./dL   Nitrite, UA Negative Negative   Leukocytes, UA Negative Negative    ASSESSMENT AND PLAN: 1. Uncontrolled type 2 diabetes mellitus with peripheral neuropathy ([place]) Discussed the importance of healthy eating habits, regular aerobic exercise (at least 150 minutes a week as tolerated) and medication compliance to achieve or maintain control of diabetes. -7 she is unable to tolerate high-dose metformin, advised to keep metformin at 500 mg daily.  Increase Lantus to 16 units daily. Recommend adding Trulicity.  I went over how the medication works and possible side effects including nausea Prescription sent to her pharmacy for diabetic testing supplies.  Encouraged her to check blood sugars at least twice a day.  We will schedule appointment with our clinical pharmacist in 1 month Encouraged her to stay active - POCT glucose (manual entry) - POCT glycosylated hemoglobin (Hb A1C) - POCT URINALYSIS DIP (CLINITEK) - Insulin Glargine (LANTUS SOLOSTAR) 100 UNIT/ML Solostar Pen; INJECT 16 UNITS INTO THE SKIN AT BEDTIME.  Dispense: 15 mL; Refill: 2 - Dulaglutide (TRULICITY) 2.20 UR/4.2HC SOPN; Inject 0.75 mg into the skin once a week.  Dispense: 4 pen; Refill: 11 - glucose blood (ACCU-CHEK AVIVA) test strip; Use as instructed  Dispense: 100 each; Refill: 12 - Blood Glucose Monitoring Suppl (ACCU-CHEK AVIVA PLUS) w/Device KIT; 1 Device by Does not apply route 3 (three) times daily.  Dispense: 1 kit; Refill: 0 - Lancets (ACCU-CHEK SOFT TOUCH) lancets; Use as instructed  Dispense: 100 each; Refill: 12 - CBC - Comprehensive metabolic panel - Lipid panel - metFORMIN (GLUCOPHAGE-XR) 500 MG 24 hr tablet; Take 1 tablet (500 mg total) by mouth daily with breakfast.  Dispense: 90 tablet; Refill: 2  2. Essential hypertension Not at  goal.  Refill sent on Imdur which she has been out of.  Continue other medications  3. Chronic midline thoracic back pain Continue Tylenol as needed.  I was going to prescribe Voltaren gel but I see that she has reaction to ibuprofen which causes hives so I held off on doing so - VITAMIN D 25 Hydroxy (Vit-D Deficiency, Fractures) - DG Thoracic Spine W/Swimmers; Future  4. Decreased hearing of both ears - Ambulatory referral to ENT  5. DCM (dilated cardiomyopathy) ([place]) Patient plans to follow-up with cardiology for ICD placement once her housing situation stabilizes - isosorbide mononitrate (IMDUR) 30 MG 24 hr tablet; Take 1 tablet (30 mg total) by mouth daily.  Dispense: 30 tablet; Refill: 11  6. Hyperlipidemia, unspecified hyperlipidemia type - atorvastatin (LIPITOR) 80 MG tablet; Take 0.5 tablets (40 mg total) by mouth every evening.  Dispense: 30 tablet; Refill: 11  7.  Homeless  Patient was given the opportunity to ask questions.  Patient verbalized understanding of the plan and was able to repeat key elements of the plan.   Orders Placed This Encounter  Procedures  . DG Thoracic Spine W/Swimmers  . CBC  . Comprehensive metabolic panel  . Lipid panel  . VITAMIN D 25 Hydroxy (Vit-D Deficiency, Fractures)  . Ambulatory referral to ENT  . POCT glucose (manual entry)  . POCT glycosylated hemoglobin (Hb A1C)  . POCT URINALYSIS DIP (CLINITEK)     Requested Prescriptions   Signed Prescriptions Disp Refills  . atorvastatin (LIPITOR) 80 MG tablet 30 tablet 11    Sig: Take 0.5 tablets (40 mg total) by mouth every evening.  . Insulin Glargine (LANTUS SOLOSTAR) 100 UNIT/ML Solostar Pen 15 mL 2    Sig: INJECT 16 UNITS INTO THE SKIN AT BEDTIME.  . isosorbide mononitrate (IMDUR) 30 MG 24 hr tablet 30 tablet 11    Sig: Take 1 tablet (30 mg total) by mouth daily.  . Dulaglutide (TRULICITY) 8.85 OY/7.7AJ SOPN 4 pen 11    Sig: Inject 0.75 mg into the skin once a week.  Marland Kitchen glucose blood  (ACCU-CHEK AVIVA) test strip 100 each 12    Sig: Use as instructed  . Blood Glucose Monitoring Suppl (ACCU-CHEK AVIVA PLUS) w/Device KIT 1 kit 0    Sig: 1 Device by Does not apply route 3 (three) times daily.  . Lancets (ACCU-CHEK SOFT TOUCH) lancets 100 each 12    Sig: Use as instructed  . metFORMIN (GLUCOPHAGE-XR) 500 MG 24 hr tablet 90 tablet 2    Sig: Take 1 tablet (500 mg total) by mouth daily with breakfast.    Return in about 2 months (around 05/23/2018).  Karle Plumber, MD, FACP

## 2018-03-24 NOTE — Progress Notes (Signed)
Patient was educated on the use of the Trulicity pen. Reviewed necessary supplies and operation of the pen. Also reviewed goal blood glucose levels. Patient was able to demonstrate use. All questions and concerns were addressed.  

## 2018-03-24 NOTE — Progress Notes (Signed)
Pt states her b/l ears are bothering her  Pt states she is having discomfort in her back  Pt states she is having issues with her b/l legs

## 2018-03-24 NOTE — Patient Instructions (Addendum)
Please give patient an appointment with our clinical pharmacist in 1 month for titration of diabetes medicines.  Please check your blood sugars at least twice a day and bring in readings on next visit.  Increase Lantus to 16 units daily.  Change metformin to 500 mg once a day.  We have added a medication called Trulicity which she will take once a week.  Please go to radiology department at [facility] to have the x-ray done of your back.  Okay to continue using Tylenol as needed for the back pain.  If you have a heating pad I would also use a heating pad to the back.

## 2018-03-25 LAB — VITAMIN D 25 HYDROXY (VIT D DEFICIENCY, FRACTURES): Vit D, 25-Hydroxy: 8.6 ng/mL — ABNORMAL LOW (ref 30.0–100.0)

## 2018-03-25 LAB — COMPREHENSIVE METABOLIC PANEL
ALBUMIN: 4.2 g/dL (ref 3.8–4.8)
ALK PHOS: 107 IU/L (ref 39–117)
ALT: 8 IU/L (ref 0–32)
AST: 7 IU/L (ref 0–40)
Albumin/Globulin Ratio: 1.4 (ref 1.2–2.2)
BUN / CREAT RATIO: 14 (ref 9–23)
BUN: 16 mg/dL (ref 6–24)
Bilirubin Total: 0.4 mg/dL (ref 0.0–1.2)
CALCIUM: 9.9 mg/dL (ref 8.7–10.2)
CO2: 21 mmol/L (ref 20–29)
CREATININE: 1.14 mg/dL — AB (ref 0.57–1.00)
Chloride: 96 mmol/L (ref 96–106)
GFR calc Af Amer: 65 mL/min/{1.73_m2} (ref >59)
GFR, EST NON AFRICAN AMERICAN: 56 mL/min/{1.73_m2} — AB (ref >59)
GLUCOSE: 413 mg/dL — AB (ref 65–99)
Globulin, Total: 2.9 g/dL (ref 1.5–4.5)
Potassium: 4.8 mmol/L (ref 3.5–5.2)
Sodium: 133 mmol/L — ABNORMAL LOW (ref 134–144)
Total Protein: 7.1 g/dL (ref 6.0–8.5)

## 2018-03-25 LAB — CBC
HEMATOCRIT: 39.5 % (ref 34.0–46.6)
HEMOGLOBIN: 12.9 g/dL (ref 11.1–15.9)
MCH: 28.5 pg (ref 26.6–33.0)
MCHC: 32.7 g/dL (ref 31.5–35.7)
MCV: 87 fL (ref 79–97)
Platelets: 357 10*3/uL (ref 150–450)
RBC: 4.53 x10E6/uL (ref 3.77–5.28)
RDW: 13.8 % (ref 11.7–15.4)
WBC: 7.2 10*3/uL (ref 3.4–10.8)

## 2018-03-25 LAB — LIPID PANEL
CHOL/HDL RATIO: 6.4 ratio — AB (ref 0.0–4.4)
CHOLESTEROL TOTAL: 251 mg/dL — AB (ref 100–199)
HDL: 39 mg/dL — ABNORMAL LOW (ref >39)
LDL CALC: 177 mg/dL — AB (ref 0–99)
TRIGLYCERIDES: 176 mg/dL — AB (ref 0–149)
VLDL Cholesterol Cal: 35 mg/dL (ref 5–40)

## 2018-03-26 ENCOUNTER — Ambulatory Visit (HOSPITAL_COMMUNITY)
Admission: RE | Admit: 2018-03-26 | Discharge: 2018-03-26 | Disposition: A | Discharge status: Home / Self Care | Payer: Medicaid Other | Source: Ambulatory Visit | Attending: Internal Medicine | Admitting: Internal Medicine

## 2018-03-26 ENCOUNTER — Other Ambulatory Visit: Payer: Self-pay | Admitting: Internal Medicine

## 2018-03-26 DIAGNOSIS — M546 Pain in thoracic spine: Secondary | ICD-10-CM | POA: Insufficient documentation

## 2018-03-26 DIAGNOSIS — G8929 Other chronic pain: Secondary | ICD-10-CM | POA: Insufficient documentation

## 2018-03-26 DIAGNOSIS — E559 Vitamin D deficiency, unspecified: Secondary | ICD-10-CM | POA: Insufficient documentation

## 2018-03-26 MED ORDER — VITAMIN D (ERGOCALCIFEROL) 1.25 MG (50000 UNIT) PO CAPS: 50000.0000 [IU] | ORAL_CAPSULE | ORAL | 3 refills | Status: DC

## 2018-03-27 ENCOUNTER — Telehealth: Payer: Self-pay

## 2018-03-27 NOTE — Telephone Encounter (Signed)
Contacted pt to go over lab results pt is aware and doesn't have any questions or concerns 

## 2018-03-31 ENCOUNTER — Other Ambulatory Visit: Payer: Self-pay | Admitting: Pharmacist

## 2018-03-31 MED ORDER — ACCU-CHEK FASTCLIX LANCETS MISC: 1.0000 | Freq: Three times a day (TID) | 11 refills | Status: DC

## 2018-03-31 MED ORDER — ACCU-CHEK GUIDE ME W/DEVICE KIT: 1.0000 | PACK | Freq: Three times a day (TID) | 0 refills | Status: DC

## 2018-03-31 MED ORDER — GLUCOSE BLOOD VI STRP: ORAL_STRIP | 11 refills | Status: DC

## 2018-04-02 ENCOUNTER — Telehealth: Payer: Self-pay

## 2018-04-02 NOTE — Telephone Encounter (Signed)
Contacted pt to go over xray results pt is aware and doesn't have any questions or concerns  

## 2018-04-08 ENCOUNTER — Telehealth: Payer: Self-pay | Admitting: Internal Medicine

## 2018-04-08 NOTE — Telephone Encounter (Signed)
Patient called because she is experiencing a lot of yeast and is wanting to get some advice. Please follow up  Advised to go to ED or UC

## 2018-04-08 NOTE — Telephone Encounter (Signed)
Patient called in reference to her call this morning concerning what the patient feels is an increase in vaginal yeast.  Patient concerned that the yeast is a side effect of her medication. Patient advise that she should make an appointment.  Patient was transferred to the registration team.

## 2018-04-28 ENCOUNTER — Ambulatory Visit: Payer: Medicaid Other | Admitting: Pharmacist

## 2018-04-30 ENCOUNTER — Ambulatory Visit: Payer: Medicaid Other | Admitting: Critical Care Medicine

## 2018-05-14 IMAGING — CR DG CHEST 2V
2 series · 2 of 2 positions shown · non-contrast
Comparison: 04/02/2017

CLINICAL DATA: Chest pain cough and short of breath

EXAM:
CHEST - 2 VIEW

[w chest pa]
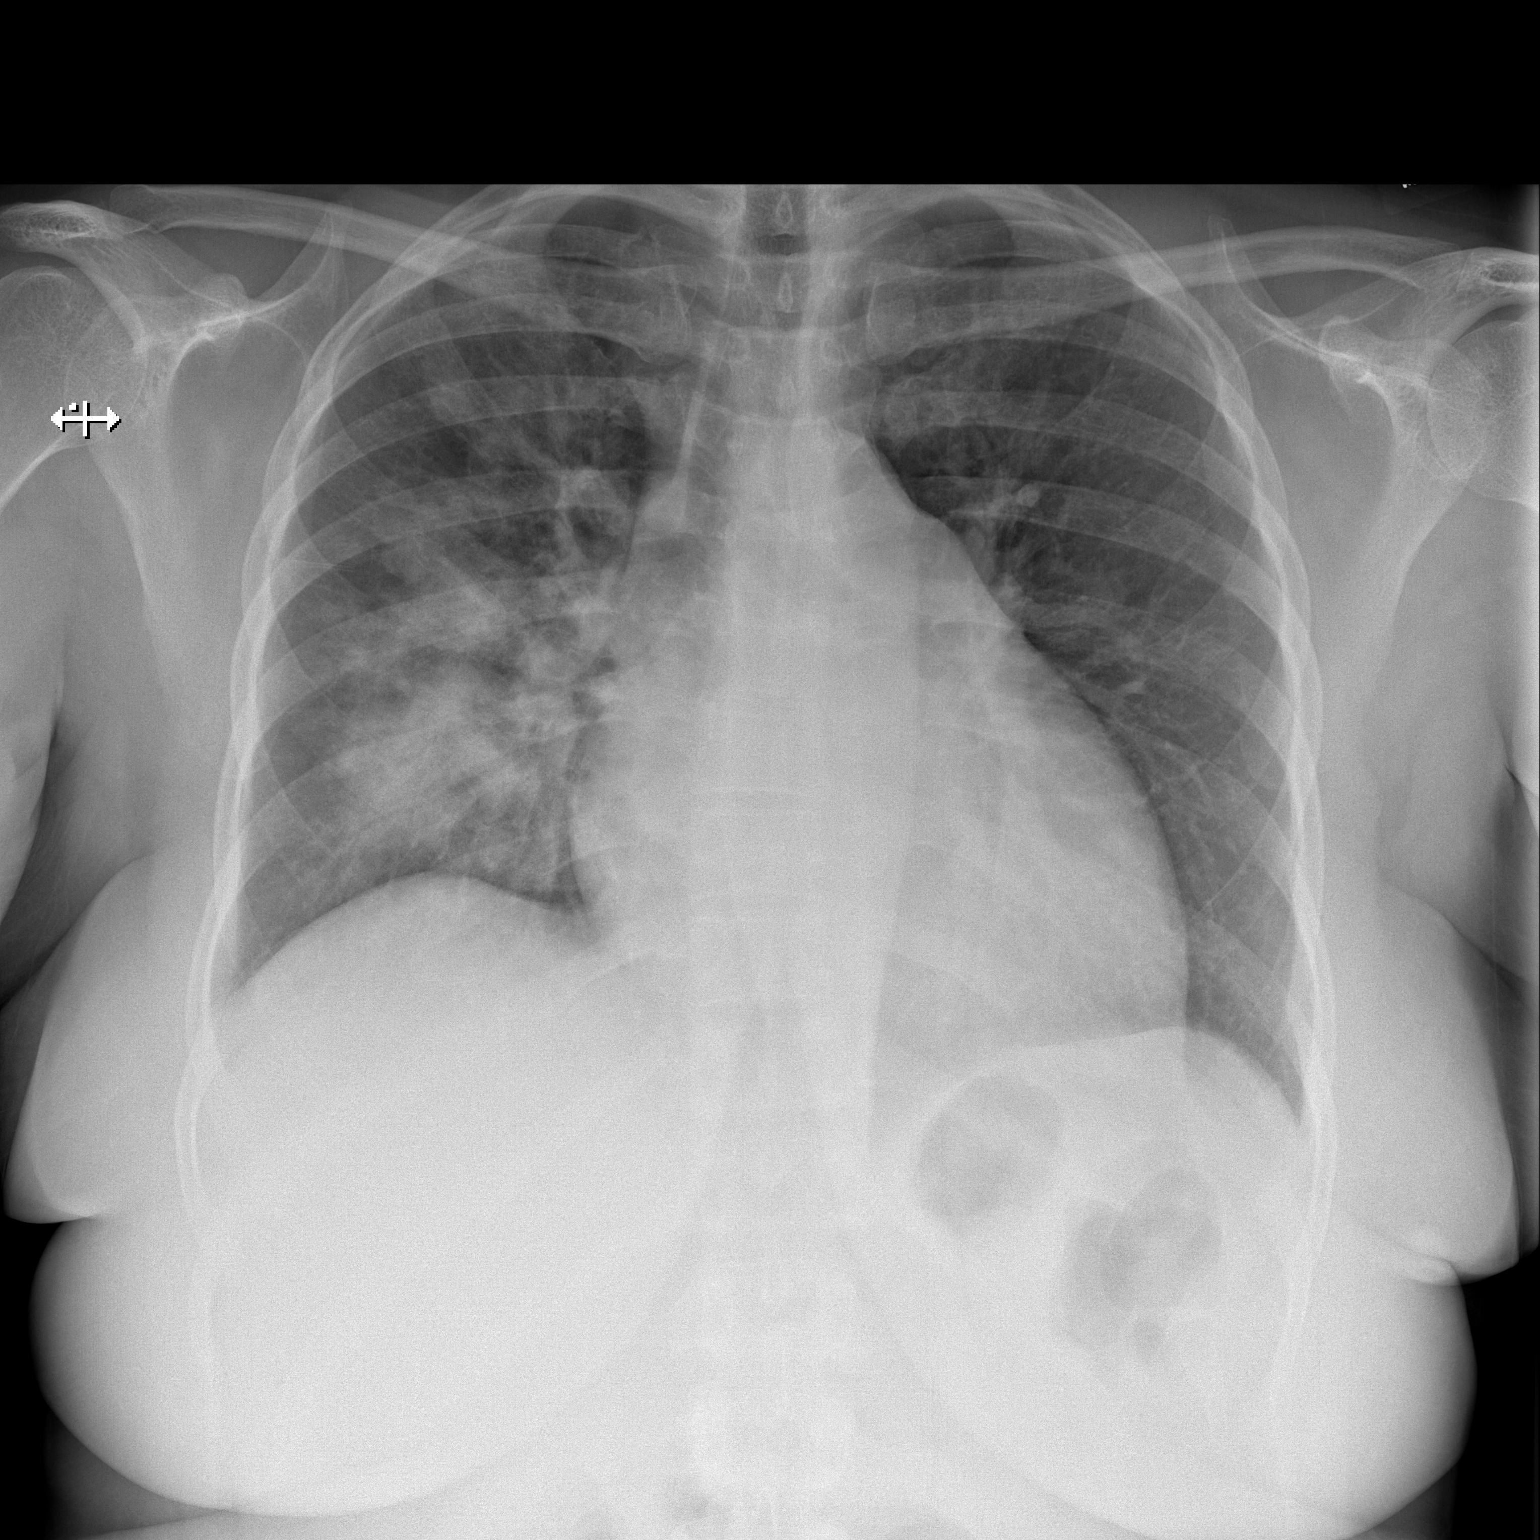

[w chest lat]
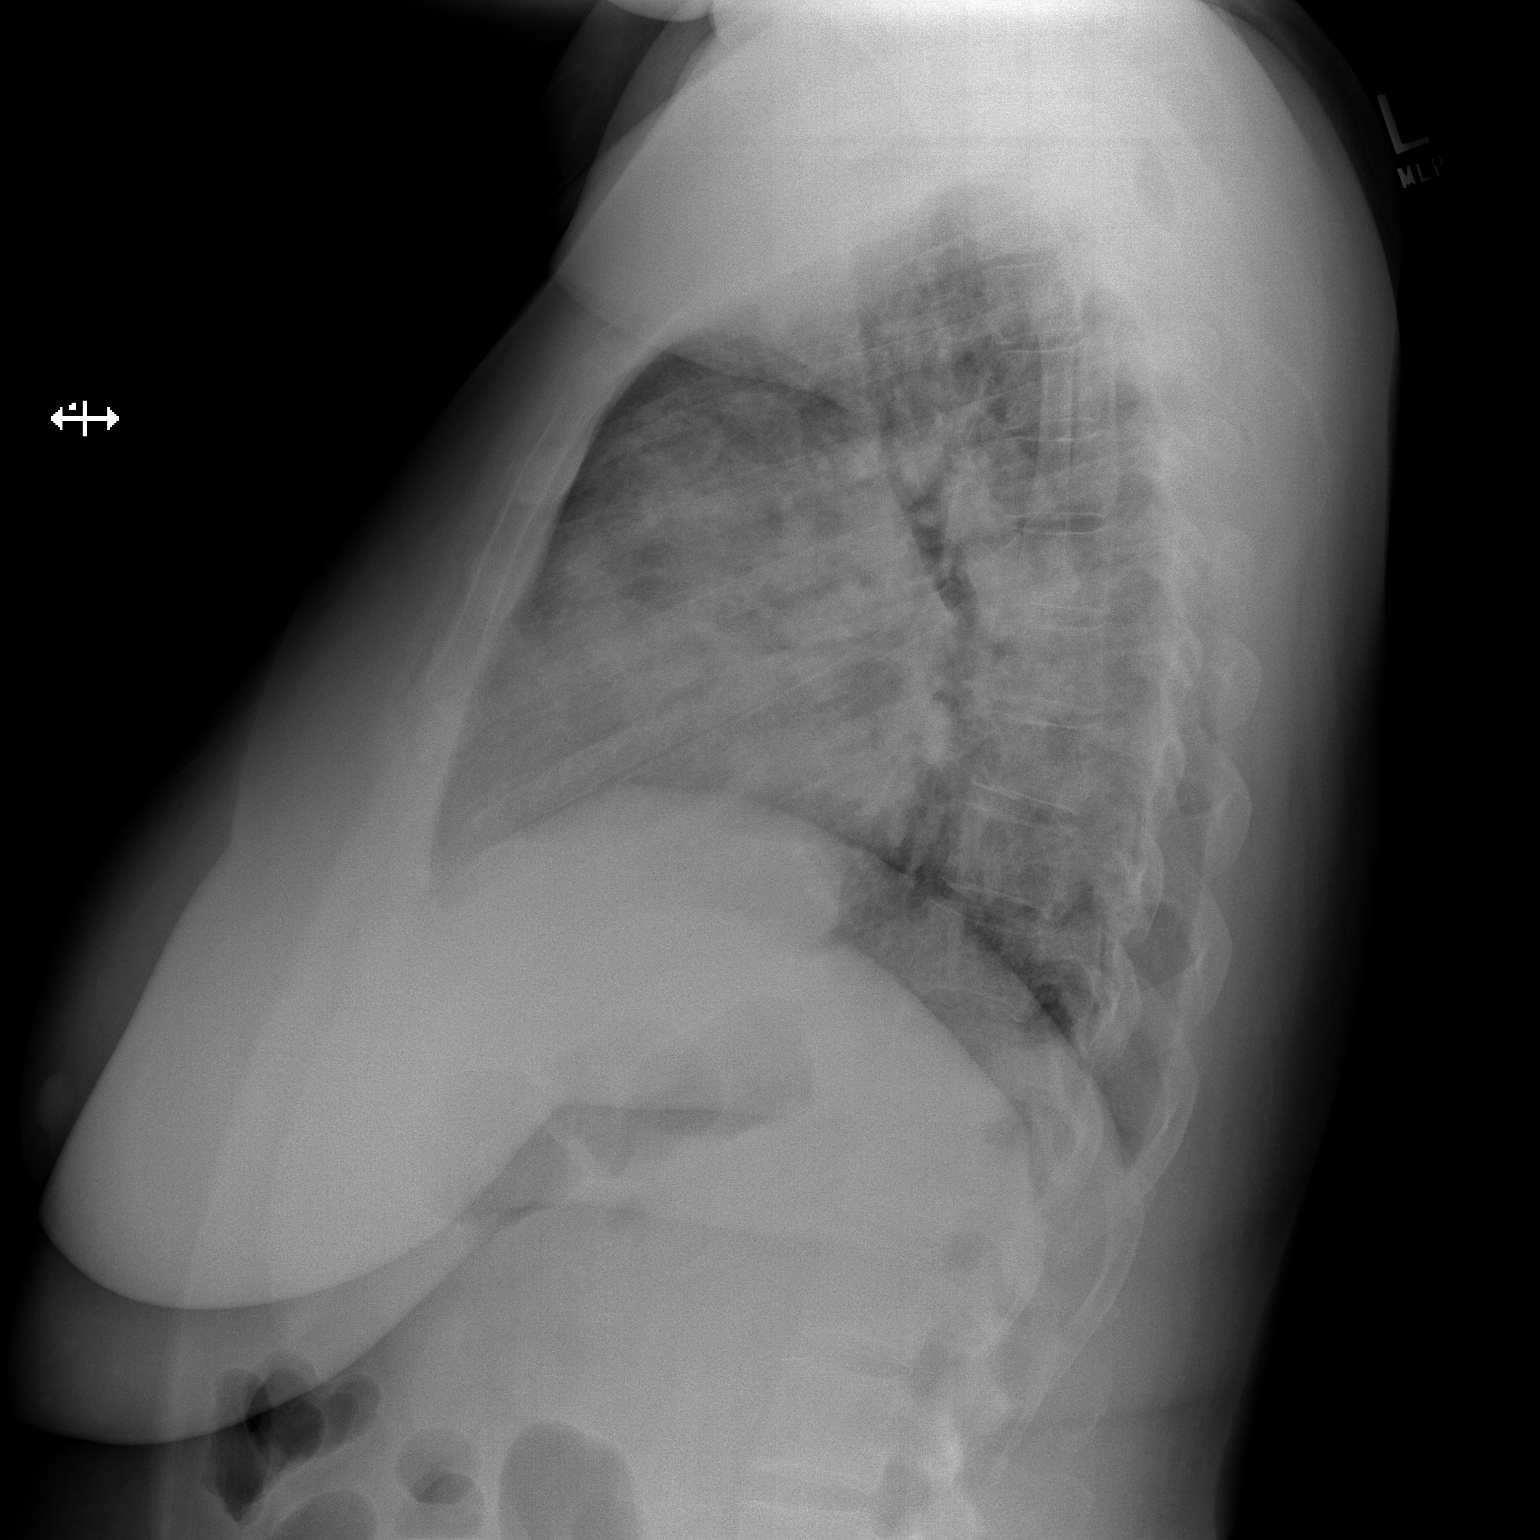

[2 of 2 positions shown; findings below may reference images not displayed]

FINDINGS: Patchy right lower lobe infiltrate is new. Left lung clear. Cardiac
enlargement without heart failure or effusion.
IMPRESSION: Moderately large infiltrate on the right, most likely pneumonia.

## 2018-05-26 ENCOUNTER — Other Ambulatory Visit: Payer: Self-pay

## 2018-05-26 ENCOUNTER — Ambulatory Visit: Payer: Medicaid Other | Attending: Internal Medicine | Admitting: Internal Medicine

## 2018-05-26 DIAGNOSIS — E1165 Type 2 diabetes mellitus with hyperglycemia: Secondary | ICD-10-CM | POA: Diagnosis not present

## 2018-05-26 DIAGNOSIS — B373 Candidiasis of vulva and vagina: Secondary | ICD-10-CM | POA: Diagnosis not present

## 2018-05-26 DIAGNOSIS — IMO0002 Reserved for concepts with insufficient information to code with codable children: Secondary | ICD-10-CM

## 2018-05-26 DIAGNOSIS — E1142 Type 2 diabetes mellitus with diabetic polyneuropathy: Secondary | ICD-10-CM | POA: Diagnosis not present

## 2018-05-26 DIAGNOSIS — E559 Vitamin D deficiency, unspecified: Secondary | ICD-10-CM

## 2018-05-26 DIAGNOSIS — B3731 Acute candidiasis of vulva and vagina: Secondary | ICD-10-CM

## 2018-05-26 DIAGNOSIS — I420 Dilated cardiomyopathy: Secondary | ICD-10-CM | POA: Diagnosis not present

## 2018-05-26 MED ORDER — FLUCONAZOLE 150 MG PO TABS: 150.0000 mg | ORAL_TABLET | Freq: Once | ORAL | 0 refills | Status: AC

## 2018-05-26 MED ORDER — INSULIN GLARGINE 100 UNIT/ML SOLOSTAR PEN: PEN_INJECTOR | SUBCUTANEOUS | 2 refills | Status: DC

## 2018-05-26 MED ORDER — INSULIN ASPART 100 UNIT/ML FLEXPEN: 5.0000 [IU] | PEN_INJECTOR | Freq: Three times a day (TID) | SUBCUTANEOUS | 11 refills | Status: DC

## 2018-05-26 NOTE — Progress Notes (Signed)
Virtual Visit via Telephone Note  I connected with [name] on 05/26/18 at 12:07 p.m by telephone from my office and verified that I am speaking with the correct person using two identifiers. Pt in a room at a motel.   I discussed the limitations, risks, security and privacy concerns of performing an evaluation and management service by telephone and the availability of in person appointments. I also discussed with the patient that there may be a patient responsible charge related to this service. The patient expressed understanding and agreed to proceed.   History of Present Illness: DMwith neuropathy. DCM/CHF with EF25% on 05/2016, HL, lung nodules, HTN, hx of carotid dissection, CKD 2-3, mild anemia.  Last seen 03/24/2018   Homeless: still homeless.  Currently staying at a motel for the past 1 mth  DM:  Not checking BS daily but they have been in the 300. Reports taking Lantus 18 units.  She went back to taking Metformin 500 mg BID because BS have been high. Still causes diarrhea.  Her insurance did not approve the Trulicity. -endorses polyuria and polydipsia -Eating habits: drinking more water and diet sodas instead of regular sodas.  Admits that she was binge eating at the start of COVID19 pandemic but doing better now.  Has a fridge and microwave in the room.  Eating more frozen meals which she can put in the microwave  DCM/CHF/HTN: reports compliance with meds and trying to limit salt No CP/SOB/LE edema She has not f/u with cardiology as yet for ICD because she still has unstable housing situation  C/o having vaginal yeast infection.  Using OTC cream but has not resolve.  Requesting oral pill instead  Vit D def: Discovered on blood test done on last visit.  I prescribed high-dose vitamin D.  Filled the high dose vit D tab one time. Has 2-3 RF left and wanted to know whether she needs to get it refilled Observations/Objective:   Chemistry      Component Value Date/Time   NA 133  (L) 03/24/2018 1044   K 4.8 03/24/2018 1044   CL 96 03/24/2018 1044   CO2 21 03/24/2018 1044   BUN 16 03/24/2018 1044   CREATININE 1.14 (H) 03/24/2018 1044   CREATININE 0.92 06/29/2016 1031      Component Value Date/Time   CALCIUM 9.9 03/24/2018 1044   ALKPHOS 107 03/24/2018 1044   AST 7 03/24/2018 1044   ALT 8 03/24/2018 1044   BILITOT 0.4 03/24/2018 1044     Lab Results  Component Value Date   WBC 7.2 03/24/2018   HGB 12.9 03/24/2018   HCT 39.5 03/24/2018   MCV 87 03/24/2018   PLT 357 03/24/2018   Vitamin D level is 8.6  Assessment and Plan: 1. Uncontrolled type 2 diabetes mellitus with peripheral neuropathy (HCC) I have taken Trulicity off her list since it is not covered by her insurance. Recommend increase Lantus to 20 units daily.  Add NovoLog 5 units with meals. Continue to encourage healthy eating habits as much as she can given her living circumstances - insulin aspart (NOVOLOG) 100 UNIT/ML FlexPen; Inject 5 Units into the skin 3 (three) times daily with meals.  Dispense: 15 mL; Refill: 11 - Insulin Glargine (LANTUS SOLOSTAR) 100 UNIT/ML Solostar Pen; INJECT 20 UNITS INTO THE SKIN AT BEDTIME.  Dispense: 15 mL; Refill: 2  2. Vaginal yeast infection Told patient that recurrent yeast infection can be due to uncontrolled diabetes.  Encourage better blood sugar control. - fluconazole (DIFLUCAN)  150 MG tablet; Take 1 tablet (150 mg total) by mouth once for 1 dose.  Dispense: 1 tablet; Refill: 0  3. DCM (dilated cardiomyopathy) (HCC) Clinically stable.  Continue current medications  4. Vitamin D deficiency Advised to continue the high-dose vitamin D for total of 12 weeks.  Once she has completed that then she can purchase vitamin D 1000 IU over-the-counter and take it daily  Follow Up Instructions: Follow-up in 2 months   I discussed the assessment and treatment plan with the patient. The patient was provided an opportunity to ask questions and all were answered. The  patient agreed with the plan and demonstrated an understanding of the instructions.   The patient was advised to call back or seek an in-person evaluation if the symptoms worsen or if the condition fails to improve as anticipated.  I provided 18 minutes of non-face-to-face time during this encounter.   Jonah Blue, MD

## 2018-05-26 NOTE — Progress Notes (Signed)
Pt states the Truclicity was never approved so she has taken anything

## 2018-05-27 ENCOUNTER — Other Ambulatory Visit: Payer: Self-pay | Admitting: Pharmacist

## 2018-05-27 MED ORDER — INSULIN LISPRO (1 UNIT DIAL) 100 UNIT/ML (KWIKPEN): 5.0000 [IU] | PEN_INJECTOR | Freq: Three times a day (TID) | SUBCUTANEOUS | 0 refills | Status: DC

## 2018-05-27 NOTE — Progress Notes (Signed)
Insurance prefers Humalog. Will substitute per auto-sub policy.

## 2018-07-02 IMAGING — DX DG CHEST 1V PORT
1 series · 1 of 1 positions shown · non-contrast
Comparison: Chest CT and chest x-ray dated 05/31/2017

CLINICAL DATA: Severe shortness of breath.

EXAM:
PORTABLE CHEST 1 VIEW

[chest ap]
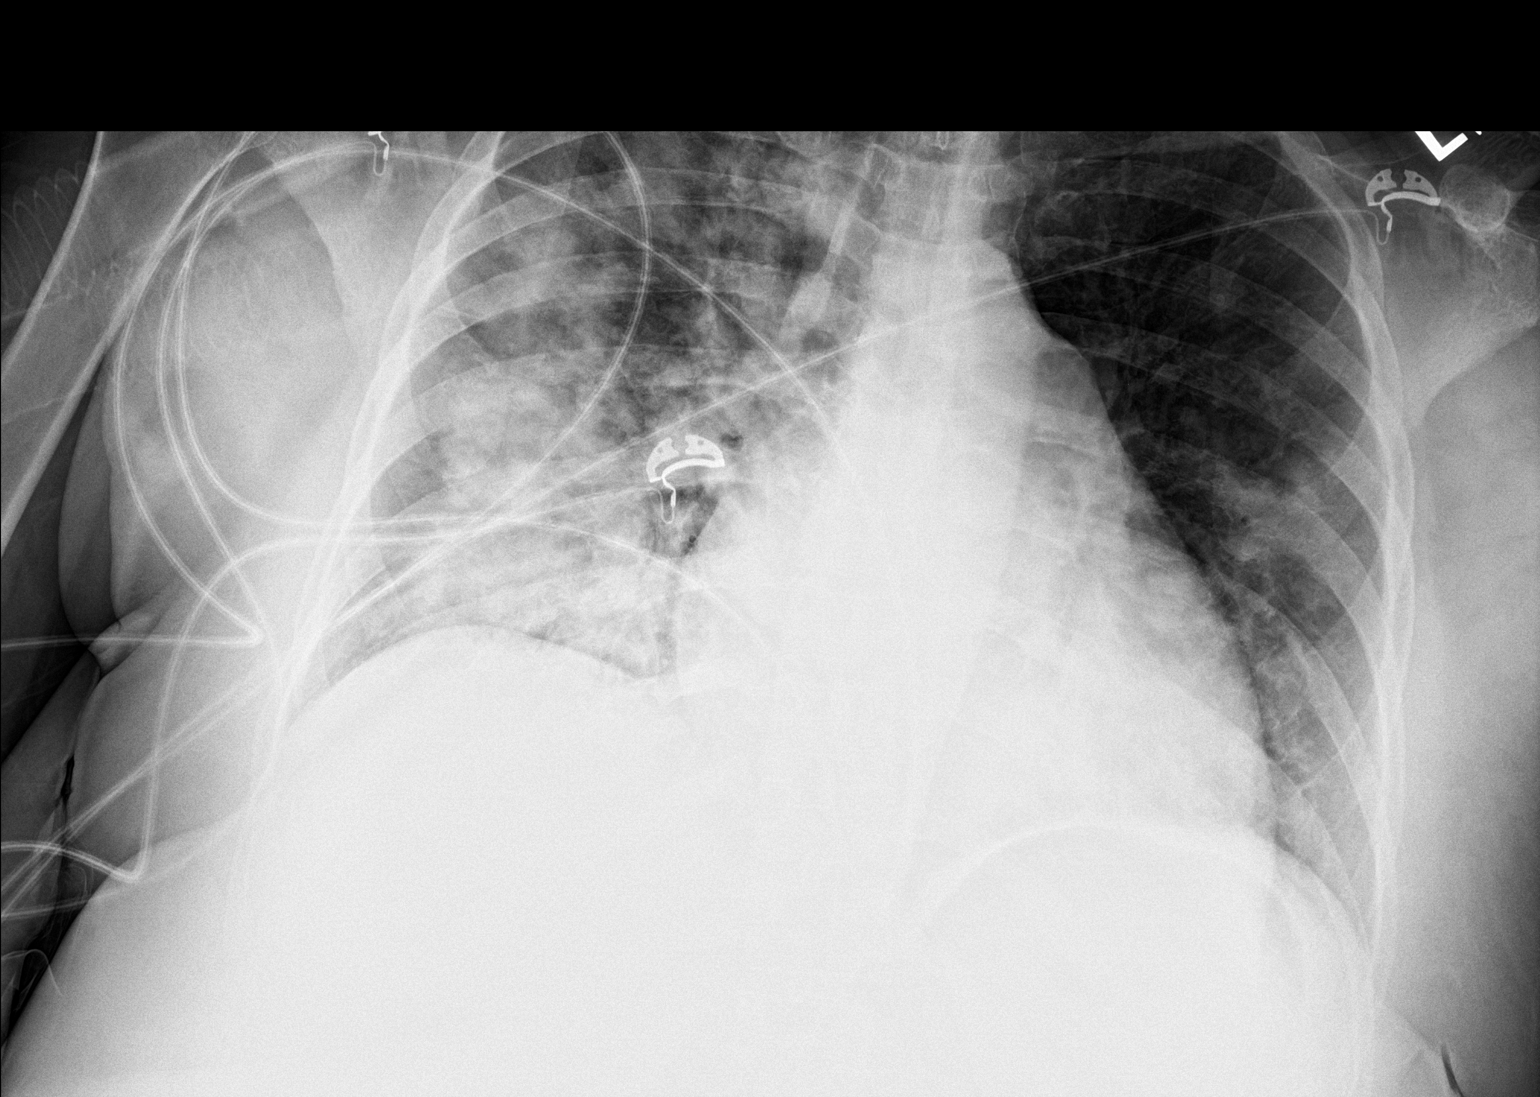

[1 of 1 positions shown; findings below may reference images not displayed]

FINDINGS: There are extensive patchy areas of consolidative infiltrate in the
right lung with small patchy areas of infiltrate in the left mid and
lower lung zone.

Heart size is within normal limits. Pulmonary vascularity appears
normal. No discrete effusions. No bone abnormality.
IMPRESSION: Bilateral pulmonary infiltrates, much worse on the right than the
left, similar to the appearance on the prior CT scan. This probably
represents multifocal pneumonia.

## 2018-07-16 ENCOUNTER — Telehealth: Payer: Self-pay | Admitting: *Deleted

## 2018-07-16 ENCOUNTER — Telehealth: Payer: Self-pay | Admitting: Internal Medicine

## 2018-07-16 DIAGNOSIS — E1142 Type 2 diabetes mellitus with diabetic polyneuropathy: Secondary | ICD-10-CM

## 2018-07-16 DIAGNOSIS — IMO0002 Reserved for concepts with insufficient information to code with codable children: Secondary | ICD-10-CM

## 2018-07-16 MED ORDER — METFORMIN HCL ER 500 MG PO TB24: 500.0000 mg | ORAL_TABLET | Freq: Every day | ORAL | 2 refills | Status: DC

## 2018-07-16 MED FILL — metFORMIN HCL 500 MG TABS: 500 | 30 days supply | Qty: 30 | Fill #0

## 2018-07-16 NOTE — Telephone Encounter (Signed)
Patients medication was sent due [facility] being closed down temporarily and metformin being on back order

## 2018-07-16 NOTE — Telephone Encounter (Signed)
New Message  Pt calling states her pharmacy told her that the Metformin is on back order and she wants to know what she should do about taking it. Please f/u

## 2018-07-16 NOTE — Telephone Encounter (Signed)
Patient verified DOB Patient is aware of [facility] transferring her metformin here and contacting her for payment and shipping. Address has been verified.

## 2018-08-04 ENCOUNTER — Other Ambulatory Visit: Payer: Self-pay | Admitting: Internal Medicine

## 2018-08-04 DIAGNOSIS — I428 Other cardiomyopathies: Secondary | ICD-10-CM

## 2018-08-04 DIAGNOSIS — I5022 Chronic systolic (congestive) heart failure: Secondary | ICD-10-CM

## 2018-08-04 DIAGNOSIS — I10 Essential (primary) hypertension: Secondary | ICD-10-CM

## 2018-08-04 MED ORDER — HYDRALAZINE HCL 25 MG PO TABS: 25.0000 mg | ORAL_TABLET | Freq: Three times a day (TID) | ORAL | 6 refills | Status: DC

## 2018-08-04 MED ORDER — FUROSEMIDE 40 MG PO TABS: 40.0000 mg | ORAL_TABLET | Freq: Two times a day (BID) | ORAL | 6 refills | Status: DC

## 2018-08-04 NOTE — Telephone Encounter (Signed)
Please fill if appropriate.  

## 2018-08-04 NOTE — Telephone Encounter (Signed)
Patient called to request medication refill for   furosemide (LASIX) 40 MG tablet    hydrALAZINE (APRESOLINE) 25 MG tablet   Patient is completely out of her fluid medication  Patient uses [facility] ([facility] [place] [facility] [place]  Please advice [phone]  Thank you Emmit Pomfret

## 2018-08-13 MED FILL — metFORMIN HCL 500 MG TABS: 500 | 90 days supply | Qty: 90 | Fill #1

## 2018-08-25 ENCOUNTER — Other Ambulatory Visit: Payer: Self-pay | Admitting: Cardiology

## 2018-08-25 DIAGNOSIS — I428 Other cardiomyopathies: Secondary | ICD-10-CM

## 2018-09-23 ENCOUNTER — Other Ambulatory Visit: Payer: Self-pay | Admitting: Cardiology

## 2018-09-23 DIAGNOSIS — I428 Other cardiomyopathies: Secondary | ICD-10-CM

## 2018-09-23 MED ORDER — SPIRONOLACTONE 25 MG PO TABS: 25.0000 mg | ORAL_TABLET | Freq: Every day | ORAL | 0 refills | Status: DC

## 2018-09-23 NOTE — Telephone Encounter (Signed)
 *  STAT* If patient is at the pharmacy, call can be transferred to refill team.   1. Which medications need to be refilled? (please list name of each medication and dose if known) spironolactone (ALDACTONE) 25 MG tablet  2. Which pharmacy/location (including street and city if local pharmacy) is medication to be sent to? [facility] [place]  3. Do they need a 30 day or 90 day supply? 30  Patient has appt with Kerin Ransom on 10/15/18

## 2018-09-25 ENCOUNTER — Telehealth: Payer: Self-pay

## 2018-09-25 NOTE — Telephone Encounter (Signed)
Call placed to patient to inquire about FL2 that was received by Dr Wynetta Emery. Patient stated that [employer] [facility] # [phone]. The form is for a housing application.   Call placed to [facility], she explained that the form is needed as they are trying to secure transitional housing for the patient.  The FL2 needs to state that the " patient is not at risk of needing residential placement or a family care home."  Medication list, diagnoses need to be included. They do not need a copy of the provider's visit note. The form also needs to note that the patient can live on her own independently and manage her ADLs independently.  Informed her that the document will be ready for pick up next week.

## 2018-10-09 ENCOUNTER — Other Ambulatory Visit: Payer: Self-pay

## 2018-10-09 ENCOUNTER — Telehealth: Payer: Self-pay

## 2018-10-09 ENCOUNTER — Ambulatory Visit: Payer: Medicaid Other | Attending: Internal Medicine | Admitting: Internal Medicine

## 2018-10-09 DIAGNOSIS — I10 Essential (primary) hypertension: Secondary | ICD-10-CM

## 2018-10-09 DIAGNOSIS — I5021 Acute systolic (congestive) heart failure: Secondary | ICD-10-CM | POA: Diagnosis not present

## 2018-10-09 DIAGNOSIS — E1142 Type 2 diabetes mellitus with diabetic polyneuropathy: Secondary | ICD-10-CM

## 2018-10-09 DIAGNOSIS — I428 Other cardiomyopathies: Secondary | ICD-10-CM | POA: Diagnosis not present

## 2018-10-09 DIAGNOSIS — E1165 Type 2 diabetes mellitus with hyperglycemia: Secondary | ICD-10-CM

## 2018-10-09 DIAGNOSIS — IMO0002 Reserved for concepts with insufficient information to code with codable children: Secondary | ICD-10-CM

## 2018-10-09 DIAGNOSIS — E559 Vitamin D deficiency, unspecified: Secondary | ICD-10-CM

## 2018-10-09 MED ORDER — LANTUS SOLOSTAR 100 UNIT/ML ~~LOC~~ SOPN: PEN_INJECTOR | SUBCUTANEOUS | 2 refills | Status: DC

## 2018-10-09 MED ORDER — CARVEDILOL 6.25 MG PO TABS: 6.2500 mg | ORAL_TABLET | Freq: Two times a day (BID) | ORAL | 11 refills | Status: DC

## 2018-10-09 MED ORDER — SPIRONOLACTONE 25 MG PO TABS: 25.0000 mg | ORAL_TABLET | Freq: Every day | ORAL | 6 refills | Status: DC

## 2018-10-09 NOTE — Progress Notes (Signed)
Virtual Visit via Telephone Note Due to current restrictions/limitations of in-office visits due to the COVID-19 pandemic, this scheduled clinical appointment was converted to a telehealth visit  I connected with [name] on 10/09/18 at 3:59 p.m by telephone and verified that I am speaking with the correct person using two identifiers. I am in my office.  The patient is at home.  Only the patient and myself participated in this encounter.  I discussed the limitations, risks, security and privacy concerns of performing an evaluation and management service by telephone and the availability of in person appointments. I also discussed with the patient that there may be a patient responsible charge related to this service. The patient expressed understanding and agreed to proceed.   History of Present Illness: DMwith neuropathy. DCM/CHF with EF25% on 05/2016, HL, lung nodules, HTN, hx of carotid dissection, CKD 2-3, mild anemia, homeless, vit D def  Homeless:  Still staying in [place].  She had submitted an FL2 form which my CW completed but pt states the organization where it was to be faxed to did not receive it.  DM: checks BS 3 x a wk.  Gives range 180-225 Novolog added on last visit to take with meals but taking only with lunch and dinner - 4 units.  Taking Lantus 20 units daily and Metformin Eating less.  Largest meal is lunch.  [place]  HTN/DCM/CHF:  No device to check BP.  Limits salt -needing RF on Coreg Not on Potassium but on med list No CP/SOB/LE edema/PND/orthopnea   Outpatient Encounter Medications as of 10/09/2018  Medication Sig  . ACCU-CHEK FASTCLIX LANCETS MISC 1 kit by Does not apply route 3 (three) times daily. Use to check blood sugar up to 3 times daily.  Marland Kitchen albuterol (PROVENTIL HFA;VENTOLIN HFA) 108 (90 Base) MCG/ACT inhaler Inhale 2 puffs into the lungs every 6 (six) hours as needed for wheezing or shortness of breath.  Marland Kitchen aspirin 81 MG  chewable tablet Chew 1 tablet (81 mg total) by mouth daily.  Marland Kitchen atorvastatin (LIPITOR) 80 MG tablet Take 0.5 tablets (40 mg total) by mouth every evening.  . blood glucose meter kit and supplies Dispense based on patient and insurance preference. Use up to four times daily as directed. (FOR ICD-9 250.00, 250.01).  . Blood Glucose Monitoring Suppl (ACCU-CHEK GUIDE ME) w/Device KIT 1 kit by Does not apply route 3 (three) times daily. Use to check blood sugar up to 3 times daily.  . carvedilol (COREG) 6.25 MG tablet Take 1 tablet (6.25 mg total) by mouth 2 (two) times daily with a meal.  . furosemide (LASIX) 40 MG tablet Take 1 tablet (40 mg total) by mouth 2 (two) times daily. For Fluid/Heart Failure  . glucose blood (ACCU-CHEK GUIDE) test strip Use as instructed to check blood sugar up to 3 times daily.  . hydrALAZINE (APRESOLINE) 25 MG tablet Take 1 tablet (25 mg total) by mouth 3 (three) times daily.  . Insulin Glargine (LANTUS SOLOSTAR) 100 UNIT/ML Solostar Pen INJECT 20 UNITS INTO THE SKIN AT BEDTIME.  . insulin lispro (HUMALOG KWIKPEN) 100 UNIT/ML KwikPen Inject 0.05 mLs (5 Units total) into the skin 3 (three) times daily.  . isosorbide mononitrate (IMDUR) 30 MG 24 hr tablet Take 1 tablet (30 mg total) by mouth daily.  . metFORMIN (GLUCOPHAGE-XR) 500 MG 24 hr tablet Take 1 tablet (500 mg total) by mouth daily with breakfast.  . Potassium Chloride ER 20 MEQ TBCR Take  20 mEq by mouth daily. 1 tab daily by mouth (Patient not taking: Reported on 03/24/2018)  . spironolactone (ALDACTONE) 25 MG tablet Take 1 tablet (25 mg total) by mouth daily. Please schedule appointment for refills  . TRUEPLUS PEN NEEDLES 31G X 6 MM MISC USE AS DIRECTED  . Vitamin D, Ergocalciferol, (DRISDOL) 1.25 MG (50000 UT) CAPS capsule Take 1 capsule (50,000 Units total) by mouth every 7 (seven) days.   No facility-administered encounter medications on file as of 10/09/2018.    Marland Kitchen Observations/Objective: Lab Results  Component  Value Date   WBC 7.2 03/24/2018   HGB 12.9 03/24/2018   HCT 39.5 03/24/2018   MCV 87 03/24/2018   PLT 357 03/24/2018     Chemistry      Component Value Date/Time   NA 133 (L) 03/24/2018 1044   K 4.8 03/24/2018 1044   CL 96 03/24/2018 1044   CO2 21 03/24/2018 1044   BUN 16 03/24/2018 1044   CREATININE 1.14 (H) 03/24/2018 1044   CREATININE 0.92 06/29/2016 1031      Component Value Date/Time   CALCIUM 9.9 03/24/2018 1044   ALKPHOS 107 03/24/2018 1044   AST 7 03/24/2018 1044   ALT 8 03/24/2018 1044   BILITOT 0.4 03/24/2018 1044        Assessment and Plan: 1. Uncontrolled type 2 diabetes mellitus with peripheral neuropathy, with long-term current use of insulin ([place]) Commended her on trying to improve on eating habits and on exercising regularly.  I went over blood sugar goals before meals and 2 hours after meals.  Recommend that she increase Lantus to 24 units daily and NovoLog to 5 units with meals - CBC; Future - Comprehensive metabolic panel; Future - Lipid panel; Future  2. Nonischemic cardiomyopathy ([place]) 3. Systolic CHF, acute ([place]) -No symptoms to suggest decompensation.  She will continue her current medications that include hydralazine, carvedilol, spironolactone and furosemide.  I have requested that she come to to the lab to have blood test done to check electrolytes - carvedilol (COREG) 6.25 MG tablet; Take 1 tablet (6.25 mg total) by mouth 2 (two) times daily with a meal.  Dispense: 60 tablet; Refill: 11   4. Essential hypertension Level of control unknown as patient does not have a device - carvedilol (COREG) 6.25 MG tablet; Take 1 tablet (6.25 mg total) by mouth 2 (two) times daily with a meal.  Dispense: 60 tablet; Refill: 11  5. Vitamin D deficiency - VITAMIN D 25 Hydroxy (Vit-D Deficiency, Fractures); Future  6.  Homelessness I have sent a message to case manager to follow-up with the patient regarding the FL2  Follow Up Instructions: 8 wks   I  discussed the assessment and treatment plan with the patient. The patient was provided an opportunity to ask questions and all were answered. The patient agreed with the plan and demonstrated an understanding of the instructions.   The patient was advised to call back or seek an in-person evaluation if the symptoms worsen or if the condition fails to improve as anticipated.  I provided 12 minutes of non-face-to-face time during this encounter.   Karle Plumber, MD

## 2018-10-09 NOTE — Telephone Encounter (Signed)
Call placed to [facility] # [phone] to inform her that the [facility] has been ready for pick up at [facility]. She explained that she does not need the form. It is part of a packet that the patient needs to complete for consideration for transitional  housing. It needs to be sent to [email or web address].  He can be reached at # [phone].

## 2018-10-10 ENCOUNTER — Telehealth: Payer: Self-pay | Admitting: Licensed Clinical Social Worker

## 2018-10-10 NOTE — Telephone Encounter (Signed)
LCSW placed call to Mr. Harrel Carina. He stated that patient has been withdrawn from program for not submitting documentation in the allotted time. States that patient will be allowed to re-submit paperwork as early as Monday, October 13, 2018.   LCSW faxed FL2 to Mr Jori Moll, per his request. He agreed to contact LCSW with any needs that may arise.

## 2018-10-15 ENCOUNTER — Other Ambulatory Visit: Payer: Self-pay

## 2018-10-15 ENCOUNTER — Encounter: Payer: Self-pay | Admitting: Cardiology

## 2018-10-15 ENCOUNTER — Ambulatory Visit (INDEPENDENT_AMBULATORY_CARE_PROVIDER_SITE_OTHER): Payer: Medicaid Other | Admitting: Cardiology

## 2018-10-15 DIAGNOSIS — I10 Essential (primary) hypertension: Secondary | ICD-10-CM

## 2018-10-15 DIAGNOSIS — R000 Tachycardia, unspecified: Secondary | ICD-10-CM

## 2018-10-15 DIAGNOSIS — I420 Dilated cardiomyopathy: Secondary | ICD-10-CM | POA: Diagnosis not present

## 2018-10-15 DIAGNOSIS — Z5900 Homelessness unspecified: Secondary | ICD-10-CM

## 2018-10-15 DIAGNOSIS — E785 Hyperlipidemia, unspecified: Secondary | ICD-10-CM

## 2018-10-15 DIAGNOSIS — E1169 Type 2 diabetes mellitus with other specified complication: Secondary | ICD-10-CM | POA: Diagnosis not present

## 2018-10-15 DIAGNOSIS — I428 Other cardiomyopathies: Secondary | ICD-10-CM

## 2018-10-15 DIAGNOSIS — I5022 Chronic systolic (congestive) heart failure: Secondary | ICD-10-CM

## 2018-10-15 LAB — BASIC METABOLIC PANEL
BUN/Creatinine Ratio: 16 (ref 9–23)
BUN: 16 mg/dL (ref 6–24)
CO2: 24 mmol/L (ref 20–29)
Calcium: 9.8 mg/dL (ref 8.7–10.2)
Chloride: 94 mmol/L — ABNORMAL LOW (ref 96–106)
Creatinine, Ser: 0.98 mg/dL (ref 0.57–1.00)
GFR calc Af Amer: 77 mL/min/{1.73_m2} (ref >59)
GFR calc non Af Amer: 67 mL/min/{1.73_m2} (ref >59)
Glucose: 439 mg/dL — ABNORMAL HIGH (ref 65–99)
Potassium: 4.7 mmol/L (ref 3.5–5.2)
Sodium: 132 mmol/L — ABNORMAL LOW (ref 134–144)

## 2018-10-15 LAB — TSH: TSH: 1.69 u[IU]/mL (ref 0.450–4.500)

## 2018-10-15 MED ORDER — ATORVASTATIN CALCIUM 40 MG PO TABS: 40.0000 mg | ORAL_TABLET | Freq: Every day | ORAL | 3 refills | Status: DC

## 2018-10-15 MED ORDER — SPIRONOLACTONE 25 MG PO TABS: 25.0000 mg | ORAL_TABLET | Freq: Every day | ORAL | 2 refills | Status: DC

## 2018-10-15 MED ORDER — FUROSEMIDE 40 MG PO TABS: 40.0000 mg | ORAL_TABLET | Freq: Every day | ORAL | 2 refills | Status: DC

## 2018-10-15 NOTE — Addendum Note (Signed)
Addended by: Ulice Brilliant T on: 10/15/2018 10:58 AM   Modules accepted: Orders

## 2018-10-15 NOTE — Assessment & Plan Note (Signed)
Angioedema with ACE- unable to take ARB or Entresto

## 2018-10-15 NOTE — Progress Notes (Signed)
Cardiology Office Note:    Date:  10/15/2018   ID:  [name], DOB [birth year], MRN [id number]  PCP:  Ladell Pier, MD  Cardiologist:  Dr Stanford Breed Electrophysiologist:  Baucom Grayer, MD   Referring MD: Ladell Pier, MD   No chief complaint on file.  History of Present Illness:    [name] is a pleasant 53 y.o.homeless AA female  with a hx of dilated nonischemic cardiomyopathy.  She had normal coronaries in 2013.  Her last echocardiogram was in April 2019 and showed her ejection fraction to be 25 to 30%.  She saw Dr. Rayann Heman in July 2019 and they discussed ICD implantation.  Initially she agreed but then later because of her homeless situation decided not to go through with this.  Other medical issues include insulin-dependent diabetes, hypertension with angioedema on ACE inhibitor, treated dyslipidemia, and prior left carotid artery dissection.  She saw her PCP in a virtual visit 10/09/2018.  She is in the office today for follow up and to get medication refills.  The patient tells me that she feels like she is doing well.  She is not had unusual dyspnea.  She has occasional edema in her lower extremities but none now.  We reviewed her medications in detail.  She has run out of her atorvastatin and Aldactone.  She adjusted her Lasix down to 40 mg a day and seems to be doing well with this.  Past Medical History:  Diagnosis Date  . Asthma 05/31/2017  . Diabetes mellitus, type 2 (HCC)    A1C 7.6 April '13  . Hypertension   . Left leg pain 07/21/2015  . Nonischemic cardiomyopathy ([place])    Echo 05/19/11 EF 35% w/ mild-mod MR; R/L Heart Cath 05/21/11 - mildly elevated right heart pressures, normal left heart pressures, preserved cardiac output, widely patent coronary arteries without significant CAD and moderate global systolic LV dysfunction, EF 35-40%.  . 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 Systolic CHF ([place])    Echo 05/19/11 EF 35% w/  mild-mod MR    Past Surgical History:  Procedure Laterality Date  . CERVIX LESION DESTRUCTION    . DILATION AND CURETTAGE OF UTERUS    . LEFT HEART CATHETERIZATION WITH CORONARY ANGIOGRAM N/A 05/21/2011   Procedure: LEFT HEART CATHETERIZATION WITH CORONARY ANGIOGRAM;  Surgeon: Sherren Mocha, MD;  Location: [facility] CATH LAB;  Service: Cardiovascular;  Laterality: N/A;  . TUBAL LIGATION  1996    Current Medications: Current Meds  Medication Sig  . ACCU-CHEK FASTCLIX LANCETS MISC 1 kit by Does not apply route 3 (three) times daily. Use to check blood sugar up to 3 times daily.  Marland Kitchen albuterol (PROVENTIL HFA;VENTOLIN HFA) 108 (90 Base) MCG/ACT inhaler Inhale 2 puffs into the lungs every 6 (six) hours as needed for wheezing or shortness of breath.  Marland Kitchen aspirin 81 MG chewable tablet Chew 1 tablet (81 mg total) by mouth daily.  . blood glucose meter kit and supplies Dispense based on patient and insurance preference. Use up to four times daily as directed. (FOR ICD-9 250.00, 250.01).  . Blood Glucose Monitoring Suppl (ACCU-CHEK GUIDE ME) w/Device KIT 1 kit by Does not apply route 3 (three) times daily. Use to check blood sugar up to 3 times daily.  . carvedilol (COREG) 6.25 MG tablet Take 1 tablet (6.25 mg total) by mouth 2 (two) times daily with a meal.  . furosemide (LASIX) 40 MG tablet Take 1 tablet (40 mg  total) by mouth 2 (two) times daily. For Fluid/Heart Failure (Patient taking differently: Take 40 mg by mouth daily. For Fluid/Heart Failure)  . glucose blood (ACCU-CHEK GUIDE) test strip Use as instructed to check blood sugar up to 3 times daily.  . hydrALAZINE (APRESOLINE) 25 MG tablet Take 1 tablet (25 mg total) by mouth 3 (three) times daily.  . Insulin Glargine (LANTUS SOLOSTAR) 100 UNIT/ML Solostar Pen INJECT 24 UNITS INTO THE SKIN AT BEDTIME.  . insulin lispro (HUMALOG KWIKPEN) 100 UNIT/ML KwikPen Inject 0.05 mLs (5 Units total) into the skin 3 (three) times daily.  . isosorbide mononitrate  (IMDUR) 30 MG 24 hr tablet Take 1 tablet (30 mg total) by mouth daily.  . metFORMIN (GLUCOPHAGE-XR) 500 MG 24 hr tablet Take 1 tablet (500 mg total) by mouth daily with breakfast.  . NOVOLOG FLEXPEN 100 UNIT/ML FlexPen INJECT 5 UNITS SUBCUTANEOUSLY THREE TIMES DAILY WITH MEALS  . TRUEPLUS PEN NEEDLES 31G X 6 MM MISC USE AS DIRECTED  . Vitamin D, Ergocalciferol, (DRISDOL) 1.25 MG (50000 UT) CAPS capsule Take 1 capsule (50,000 Units total) by mouth every 7 (seven) days.  . [DISCONTINUED] atorvastatin (LIPITOR) 80 MG tablet Take 0.5 tablets (40 mg total) by mouth every evening.     Allergies:   Lisinopril, Sertraline, Tramadol, and Ibuprofen   Social History   Socioeconomic History  . Marital status: Divorced    Spouse name: Not on file  . Number of children: 2  . Years of education: Not on file  . Highest education level: Not on file  Occupational History  . Not on file  Social Needs  . Financial resource strain: Not on file  . Food insecurity    Worry: Not on file    Inability: Not on file  . Transportation needs    Medical: Not on file    Non-medical: Not on file  Tobacco Use  . Smoking status: Never Smoker  . Smokeless tobacco: Never Used  Substance and Sexual Activity  . Alcohol use: No  . Drug use: No  . Sexual activity: Yes    Birth control/protection: None  Lifestyle  . Physical activity    Days per week: Not on file    Minutes per session: Not on file  . Stress: Not on file  Relationships  . Social Herbalist on phone: Not on file    Gets together: Not on file    Attends religious service: Not on file    Active member of club or organization: Not on file    Attends meetings of clubs or organizations: Not on file    Relationship status: Not on file  Other Topics Concern  . Not on file  Social History Narrative   Lives in [place].  Presently unemployed but attends school full time     Family History: The patient's family history includes Breast  cancer in her mother and paternal grandmother; Diabetes in her unknown relative; Heart disease in her unknown relative; Heart failure in her paternal grandmother.  ROS:   Please see the history of present illness.     All other systems reviewed and are negative.  EKGs/Labs/Other Studies Reviewed:    The following studies were reviewed today: Echo April 2019 Myoview April 2019  EKG:  EKG is ordered today.  The ekg ordered today demonstrates NSR- ST-100, QTc 492  Recent Labs: 03/24/2018: ALT 8; BUN 16; Creatinine, Ser 1.14; Hemoglobin 12.9; Platelets 357; Potassium 4.8; Sodium 133  Recent Lipid  Panel    Component Value Date/Time   CHOL 251 (H) 03/24/2018 1044   TRIG 176 (H) 03/24/2018 1044   HDL 39 (L) 03/24/2018 1044   CHOLHDL 6.4 (H) 03/24/2018 1044   CHOLHDL 6.4 (H) 05/05/2015 1645   VLDL 37 (H) 05/05/2015 1645   LDLCALC 177 (H) 03/24/2018 1044    Physical Exam:    VS:  BP 130/88   Pulse 100   Ht _0  (1.753 m)   Wt 217 lb 6.4 oz (98.6 kg)   BMI 32.10 kg/m     Wt Readings from Last 3 Encounters:  10/15/18 217 lb 6.4 oz (98.6 kg)  03/24/18 212 lb 6.4 oz (96.3 kg)  09/09/17 196 lb 6.4 oz (89.1 kg)     GEN: Well nourished, well developed, well dressed and groomed AA female, in no acute distress HEENT: Normal NECK: No JVD; No carotid bruits LYMPHATICS: No lymphadenopathy CARDIAC: RRR, no murmurs, rubs, gallops RESPIRATORY:  Clear to auscultation without rales, wheezing or rhonchi  ABDOMEN: Soft, non-tender, non-distended MUSCULOSKELETAL:  No edema; No deformity  SKIN: Warm and dry NEUROLOGIC:  Alert and oriented x 3 PSYCHIATRIC:  Normal affect   ASSESSMENT:    DCM (dilated cardiomyopathy) (HCC) NICM- normal coronaries 2013 Last EF 25-30% by echo April 2019 Currently compensated.  She declined ICD after July 2019 appointment with Dr Rayann Heman.  Homeless Pt is living in a motel. Her PCP has contacted social services. Despite her social circumstances she appears  to be doing pretty well.   Essential hypertension Angioedema with ACE- unable to take ARB or Entresto.  She is on Hydralazine and Imdur.   Dyslipidemia associated with type 2 diabetes mellitus (HCC) Last LDL was 177- she is not on statin Rx  Sinus tachycardia Resting HR 100 on Coreg  PLAN:    Resume Lipitor 40 mg and Aldactone 25 mg.  Check BMP and TSH (tacycardia on beta blocker).  Virtual visit with Dr Stanford Breed in 6 months.    Medication Adjustments/Labs and Tests Ordered: Current medicines are reviewed at length with the patient today.  Concerns regarding medicines are outlined above.  No orders of the defined types were placed in this encounter.  No orders of the defined types were placed in this encounter.   There are no Patient Instructions on file for this visit.   Angelena Form, PA-C  10/15/2018 10:47 AM    [facility]

## 2018-10-15 NOTE — Assessment & Plan Note (Signed)
Resting HR 100 on Coreg

## 2018-10-15 NOTE — Patient Instructions (Addendum)
Medication Instructions:  START Lipitor (Atorvastatin) 40mg  take 1 tablet once a day RESTART Spironolactone (Aldactone) 25mg  Take 1 tablet once a day  If you need a refill on your cardiac medications before your next appointment, please call your pharmacy.   Lab work: Your physician recommends that you return for lab work in: TODAY-BMET, TSH If you have labs (blood work) drawn today and your tests are completely normal, you will receive your results only by: Marland Kitchen MyChart Message (if you have MyChart) OR . A paper copy in the mail If you have any lab test that is abnormal or we need to change your treatment, we will call you to review the results.  Testing/Procedures: NONE   Follow-Up: At [facility], you and your health needs are our priority.  As part of our continuing mission to provide you with exceptional heart care, we have created designated Provider Care Teams.  These Care Teams include your primary Cardiologist (physician) and Advanced Practice Providers (APPs -  Physician Assistants and Nurse Practitioners) who all work together to provide you with the care you need, when you need it. You will need a follow up appointment in 6 months.  Please call our office 2 months in advance to schedule this appointment.  You may see Kirk Ruths, MD or one of the following Advanced Practice Providers on your designated Care Team:   Kerin Ransom, PA-C Roby Lofts, [place] . Sande Rives, PA-C  Any Other Special Instructions Will Be Listed Below (If Applicable).

## 2018-10-15 NOTE — Assessment & Plan Note (Signed)
Last LDL was 177- she is not on statin Rx

## 2018-10-15 NOTE — Assessment & Plan Note (Signed)
Pt is living in a motel.

## 2018-10-15 NOTE — Assessment & Plan Note (Signed)
NICM- normal coronaries 2013 Last EF 25-30% by echo April 2019 Currently compensated.  She declined ICD after July 2019 appointment with Dr Rayann Heman.

## 2018-10-22 ENCOUNTER — Ambulatory Visit: Payer: Medicaid Other | Attending: Family Medicine

## 2018-10-22 ENCOUNTER — Other Ambulatory Visit: Payer: Self-pay

## 2018-10-22 DIAGNOSIS — E559 Vitamin D deficiency, unspecified: Secondary | ICD-10-CM

## 2018-10-22 DIAGNOSIS — E1142 Type 2 diabetes mellitus with diabetic polyneuropathy: Secondary | ICD-10-CM

## 2018-10-22 DIAGNOSIS — IMO0002 Reserved for concepts with insufficient information to code with codable children: Secondary | ICD-10-CM

## 2018-10-23 LAB — COMPREHENSIVE METABOLIC PANEL
ALT: 14 IU/L (ref 0–32)
AST: 11 IU/L (ref 0–40)
Albumin/Globulin Ratio: 1.5 (ref 1.2–2.2)
Albumin: 4.1 g/dL (ref 3.8–4.9)
Alkaline Phosphatase: 118 IU/L — ABNORMAL HIGH (ref 39–117)
BUN/Creatinine Ratio: 16 (ref 9–23)
BUN: 16 mg/dL (ref 6–24)
Bilirubin Total: 0.8 mg/dL (ref 0.0–1.2)
CO2: 24 mmol/L (ref 20–29)
Calcium: 9.7 mg/dL (ref 8.7–10.2)
Chloride: 94 mmol/L — ABNORMAL LOW (ref 96–106)
Creatinine, Ser: 1.02 mg/dL — ABNORMAL HIGH (ref 0.57–1.00)
GFR calc Af Amer: 74 mL/min/{1.73_m2} (ref >59)
GFR calc non Af Amer: 64 mL/min/{1.73_m2} (ref >59)
Globulin, Total: 2.7 g/dL (ref 1.5–4.5)
Glucose: 441 mg/dL — ABNORMAL HIGH (ref 65–99)
Potassium: 4.5 mmol/L (ref 3.5–5.2)
Sodium: 133 mmol/L — ABNORMAL LOW (ref 134–144)
Total Protein: 6.8 g/dL (ref 6.0–8.5)

## 2018-10-23 LAB — CBC
Hematocrit: 44.6 % (ref 34.0–46.6)
Hemoglobin: 14.5 g/dL (ref 11.1–15.9)
MCH: 28.7 pg (ref 26.6–33.0)
MCHC: 32.5 g/dL (ref 31.5–35.7)
MCV: 88 fL (ref 79–97)
Platelets: 337 10*3/uL (ref 150–450)
RBC: 5.06 x10E6/uL (ref 3.77–5.28)
RDW: 13.7 % (ref 11.7–15.4)
WBC: 7.4 10*3/uL (ref 3.4–10.8)

## 2018-10-23 LAB — LIPID PANEL
Chol/HDL Ratio: 5.8 ratio — ABNORMAL HIGH (ref 0.0–4.4)
Cholesterol, Total: 215 mg/dL — ABNORMAL HIGH (ref 100–199)
HDL: 37 mg/dL — ABNORMAL LOW (ref >39)
LDL Chol Calc (NIH): 140 mg/dL — ABNORMAL HIGH (ref 0–99)
Triglycerides: 209 mg/dL — ABNORMAL HIGH (ref 0–149)
VLDL Cholesterol Cal: 38 mg/dL (ref 5–40)

## 2018-10-23 LAB — VITAMIN D 25 HYDROXY (VIT D DEFICIENCY, FRACTURES): Vit D, 25-Hydroxy: 22.2 ng/mL — ABNORMAL LOW (ref 30.0–100.0)

## 2018-10-27 ENCOUNTER — Telehealth: Payer: Self-pay

## 2018-10-27 LAB — SPECIMEN STATUS REPORT

## 2018-10-27 LAB — HEMOGLOBIN A1C
Est. average glucose Bld gHb Est-mCnc: 312 mg/dL
Hgb A1c MFr Bld: 12.5 % — ABNORMAL HIGH (ref 4.8–5.6)

## 2018-10-27 NOTE — Telephone Encounter (Signed)
Contacted pt to go over lab results pt is ware and doesn't have any questions or concerns

## 2018-10-28 ENCOUNTER — Other Ambulatory Visit: Payer: Self-pay

## 2018-10-28 ENCOUNTER — Telehealth: Payer: Self-pay | Admitting: Cardiology

## 2018-10-28 MED ORDER — SPIRONOLACTONE 25 MG PO TABS: 25.0000 mg | ORAL_TABLET | Freq: Every day | ORAL | 2 refills | Status: DC

## 2018-10-28 NOTE — Telephone Encounter (Signed)
New Message     *STAT* If patient is at the pharmacy, call can be transferred to refill team.   1. Which medications need to be refilled? (please list name of each medication and dose if known) Spironolactone 25mg   2. Which pharmacy/location (including street and city if local pharmacy) is medication to be sent to? [facility]  3. Do they need a 30 day or 90 day supply? 90 day

## 2018-11-13 ENCOUNTER — Ambulatory Visit: Payer: Medicaid Other | Admitting: Pharmacist

## 2018-11-24 MED FILL — metFORMIN HCL 500 MG TABS: 500 | 90 days supply | Qty: 90 | Fill #2

## 2018-11-28 ENCOUNTER — Other Ambulatory Visit: Payer: Self-pay

## 2018-11-28 ENCOUNTER — Ambulatory Visit: Payer: Medicaid Other | Attending: Internal Medicine | Admitting: Internal Medicine

## 2018-11-28 ENCOUNTER — Encounter: Payer: Self-pay | Admitting: Internal Medicine

## 2018-11-28 VITALS — BP 137/85 | HR 90 | Temp 98.2°F | Resp 16 | Wt 217.6 lb

## 2018-11-28 DIAGNOSIS — I5043 Acute on chronic combined systolic (congestive) and diastolic (congestive) heart failure: Secondary | ICD-10-CM | POA: Insufficient documentation

## 2018-11-28 DIAGNOSIS — Z794 Long term (current) use of insulin: Secondary | ICD-10-CM | POA: Diagnosis not present

## 2018-11-28 DIAGNOSIS — N183 Chronic kidney disease, stage 3 unspecified: Secondary | ICD-10-CM | POA: Insufficient documentation

## 2018-11-28 DIAGNOSIS — F419 Anxiety disorder, unspecified: Secondary | ICD-10-CM | POA: Diagnosis not present

## 2018-11-28 DIAGNOSIS — IMO0002 Reserved for concepts with insufficient information to code with codable children: Secondary | ICD-10-CM

## 2018-11-28 DIAGNOSIS — Z8249 Family history of ischemic heart disease and other diseases of the circulatory system: Secondary | ICD-10-CM | POA: Insufficient documentation

## 2018-11-28 DIAGNOSIS — E1122 Type 2 diabetes mellitus with diabetic chronic kidney disease: Secondary | ICD-10-CM | POA: Insufficient documentation

## 2018-11-28 DIAGNOSIS — F329 Major depressive disorder, single episode, unspecified: Secondary | ICD-10-CM | POA: Insufficient documentation

## 2018-11-28 DIAGNOSIS — Z886 Allergy status to analgesic agent status: Secondary | ICD-10-CM | POA: Insufficient documentation

## 2018-11-28 DIAGNOSIS — E559 Vitamin D deficiency, unspecified: Secondary | ICD-10-CM | POA: Diagnosis not present

## 2018-11-28 DIAGNOSIS — Z7982 Long term (current) use of aspirin: Secondary | ICD-10-CM | POA: Insufficient documentation

## 2018-11-28 DIAGNOSIS — E1165 Type 2 diabetes mellitus with hyperglycemia: Secondary | ICD-10-CM | POA: Diagnosis not present

## 2018-11-28 DIAGNOSIS — Z888 Allergy status to other drugs, medicaments and biological substances status: Secondary | ICD-10-CM | POA: Diagnosis not present

## 2018-11-28 DIAGNOSIS — Z590 Homelessness: Secondary | ICD-10-CM | POA: Insufficient documentation

## 2018-11-28 DIAGNOSIS — I10 Essential (primary) hypertension: Secondary | ICD-10-CM

## 2018-11-28 DIAGNOSIS — E1142 Type 2 diabetes mellitus with diabetic polyneuropathy: Secondary | ICD-10-CM | POA: Diagnosis not present

## 2018-11-28 DIAGNOSIS — Z79899 Other long term (current) drug therapy: Secondary | ICD-10-CM | POA: Insufficient documentation

## 2018-11-28 DIAGNOSIS — E1169 Type 2 diabetes mellitus with other specified complication: Secondary | ICD-10-CM

## 2018-11-28 DIAGNOSIS — E785 Hyperlipidemia, unspecified: Secondary | ICD-10-CM | POA: Insufficient documentation

## 2018-11-28 DIAGNOSIS — D649 Anemia, unspecified: Secondary | ICD-10-CM | POA: Diagnosis not present

## 2018-11-28 DIAGNOSIS — I130 Hypertensive heart and chronic kidney disease with heart failure and stage 1 through stage 4 chronic kidney disease, or unspecified chronic kidney disease: Secondary | ICD-10-CM | POA: Diagnosis not present

## 2018-11-28 DIAGNOSIS — Z2821 Immunization not carried out because of patient refusal: Secondary | ICD-10-CM

## 2018-11-28 DIAGNOSIS — R413 Other amnesia: Secondary | ICD-10-CM | POA: Insufficient documentation

## 2018-11-28 DIAGNOSIS — I420 Dilated cardiomyopathy: Secondary | ICD-10-CM | POA: Diagnosis not present

## 2018-11-28 DIAGNOSIS — F32A Depression, unspecified: Secondary | ICD-10-CM

## 2018-11-28 DIAGNOSIS — Z885 Allergy status to narcotic agent status: Secondary | ICD-10-CM | POA: Insufficient documentation

## 2018-11-28 LAB — GLUCOSE, POCT (MANUAL RESULT ENTRY): POC Glucose: 317 mg/dl — AB (ref 70–99)

## 2018-11-28 MED ORDER — ATORVASTATIN CALCIUM 40 MG PO TABS: 40.0000 mg | ORAL_TABLET | Freq: Every day | ORAL | 3 refills | Status: DC

## 2018-11-28 MED ORDER — LANTUS SOLOSTAR 100 UNIT/ML ~~LOC~~ SOPN: PEN_INJECTOR | SUBCUTANEOUS | 2 refills | Status: DC

## 2018-11-28 MED ORDER — DULOXETINE HCL 20 MG PO CPEP: 20.0000 mg | ORAL_CAPSULE | Freq: Every day | ORAL | 5 refills | Status: DC

## 2018-11-28 MED ORDER — ALBUTEROL SULFATE HFA 108 (90 BASE) MCG/ACT IN AERS: 2.0000 | INHALATION_SPRAY | Freq: Four times a day (QID) | RESPIRATORY_TRACT | 6 refills | Status: DC | PRN

## 2018-11-28 NOTE — Patient Instructions (Addendum)
Increase Lantus insulin to 35 units in the evenings. Please put up a sign on your refrigerator to remind yourself to give 5 units of Humalog with your meals.  Start Cymbalta 20 units at bedtime to help with depression and anxiety.

## 2018-11-28 NOTE — Progress Notes (Signed)
Patient ID: [name], female    DOB: 25-May-1965  MRN: [id number]  CC: Follow-up (8 week)   Subjective: [name] is a 53 y.o. female who presents for chronic disease management.  Last evaluated on 10/09/2018. Her concerns today include:  DMwith neuropathy. DCM/CHF with EF25% on 05/2016, HL, lung nodules, HTN, hx of carotid dissection, CKD 2-3, mild anemia, homeless, vit D def  Vit D: I went over blood tests that were done last month.  Level was still low but had improved.  Level was 22.  Advised to take vitamin D 800 IU daily.  She purchased vit D OTC but does not recall the dose  HL:  LDL was 140.  She admits that she was not taking the Lipitor consistently. RF sent to her pharm by cardiology on her visit with them last month.  However she does not think that the pharmacy gave her the medicine.  She is concerned that her memory is very bad.  HTN:  No BP device to check blood pressure.  She reports taking her medicines.  No chest pains or shortness of breath.  No lower extremity edema  DIABETES TYPE 2 Last A1C:   Results for orders placed or performed in visit on 11/28/18  POCT glucose (manual entry)  Result Value Ref Range   POC Glucose 317 (A) 70 - 99 mg/dl   A1C was 12.5 on 10/22/2018 Med Adherence:  [x]  Yes  -takes Lantus 28 units at bedtime. She forgets to take the Novolog with meals Medication side effects:  []  Yes    []  No Home Monitoring?  [x]  Yes    []  No Home glucose results range: 300s Diet Adherence: She tries to eat healthy but admits that she has to eat which she can get Exercise: She still tries to walk several days a week at a nearby park. Hypoglycemic episodes?: []  Yes    [x]  No Numbness of the feet? [x]  Yes    []  No Retinopathy hx? []  Yes    []  No Last eye exam:  Comments:   Tearful today. Feels depress.  Does not want to be around people.  Has a lot of anxiety at nights.  Stressors include dealing with COVID pandemic, has friends who have died from  [place] and she worries about memory decline.  Afraid of getting sick through the night and no one there to help her.  She still resides at a motel.  Request RF on albuterol inhaler.  States that she was given this inhaler last year when she was discharged from the hospital with pneumonia.  She has found it helpful and would like a refill.  Has history of asthma. -speaks with a therapist once a wk.  She states therapist had recommended med in past but she did not want to     Patient Active Problem List   Diagnosis Date Noted   Sinus tachycardia 10/15/2018   Vitamin D deficiency 03/26/2018   Homeless 03/24/2018   Microalbuminuria 08/11/2017   Anemia 08/08/2017   Physical deconditioning    Hyperlipidemia 02/21/2017   Diabetic polyneuropathy associated with type 2 diabetes mellitus ([place]) 02/21/2017   Lung nodules 07/16/2016   Uncontrolled type 2 diabetes mellitus without complication, with long-term current use of insulin 07/16/2016   DCM (dilated cardiomyopathy) ([place])    Acute on chronic combined systolic and diastolic CHF (congestive heart failure) ([place]) 06/10/2016   Essential hypertension 06/10/2016   Dyslipidemia associated with type 2 diabetes mellitus ([place]) 06/10/2016  Current Outpatient Medications on File Prior to Visit  Medication Sig Dispense Refill   ACCU-CHEK FASTCLIX LANCETS MISC 1 kit by Does not apply route 3 (three) times daily. Use to check blood sugar up to 3 times daily. 102 each 11   aspirin 81 MG chewable tablet Chew 1 tablet (81 mg total) by mouth daily. 30 tablet 3   atorvastatin (LIPITOR) 40 MG tablet Take 1 tablet (40 mg total) by mouth daily. 90 tablet 3   blood glucose meter kit and supplies Dispense based on patient and insurance preference. Use up to four times daily as directed. (FOR ICD-9 250.00, 250.01). 1 each 0   Blood Glucose Monitoring Suppl (ACCU-CHEK GUIDE ME) w/Device KIT 1 kit by Does not apply route 3 (three) times daily. Use to  check blood sugar up to 3 times daily. 1 kit 0   carvedilol (COREG) 6.25 MG tablet Take 1 tablet (6.25 mg total) by mouth 2 (two) times daily with a meal. 60 tablet 11   furosemide (LASIX) 40 MG tablet Take 1 tablet (40 mg total) by mouth daily. For Fluid/Heart Failure 90 tablet 2   glucose blood (ACCU-CHEK GUIDE) test strip Use as instructed to check blood sugar up to 3 times daily. 100 each 11   hydrALAZINE (APRESOLINE) 25 MG tablet Take 1 tablet (25 mg total) by mouth 3 (three) times daily. 90 tablet 6   insulin lispro (HUMALOG KWIKPEN) 100 UNIT/ML KwikPen Inject 0.05 mLs (5 Units total) into the skin 3 (three) times daily. 15 mL 0   isosorbide mononitrate (IMDUR) 30 MG 24 hr tablet Take 1 tablet (30 mg total) by mouth daily. 30 tablet 11   metFORMIN (GLUCOPHAGE-XR) 500 MG 24 hr tablet Take 1 tablet (500 mg total) by mouth daily with breakfast. 90 tablet 2   NOVOLOG FLEXPEN 100 UNIT/ML FlexPen INJECT 5 UNITS SUBCUTANEOUSLY THREE TIMES DAILY WITH MEALS     spironolactone (ALDACTONE) 25 MG tablet Take 1 tablet (25 mg total) by mouth daily. 90 tablet 2   TRUEPLUS PEN NEEDLES 31G X 6 MM MISC USE AS DIRECTED 100 each 1   No current facility-administered medications on file prior to visit.     Allergies  Allergen Reactions   Lisinopril Hives and Swelling    Facial    Sertraline Hives and Swelling    Facial    Tramadol Hives and Swelling    facial   Ibuprofen Hives and Swelling    Social History   Socioeconomic History   Marital status: Divorced    Spouse name: Not on file   Number of children: 2   Years of education: Not on file   Highest education level: Not on file  Occupational History   Not on file  Social Needs   Financial resource strain: Not on file   Food insecurity    Worry: Not on file    Inability: Not on file   Transportation needs    Medical: Not on file    Non-medical: Not on file  Tobacco Use   Smoking status: Never Smoker   Smokeless  tobacco: Never Used  Substance and Sexual Activity   Alcohol use: No   Drug use: No   Sexual activity: Yes    Birth control/protection: None  Lifestyle   Physical activity    Days per week: Not on file    Minutes per session: Not on file   Stress: Not on file  Relationships   Social connections    Talks  on phone: Not on file    Gets together: Not on file    Attends religious service: Not on file    Active member of club or organization: Not on file    Attends meetings of clubs or organizations: Not on file    Relationship status: Not on file   Intimate partner violence    Fear of current or ex partner: Not on file    Emotionally abused: Not on file    Physically abused: Not on file    Forced sexual activity: Not on file  Other Topics Concern   Not on file  Social History Narrative   Lives in [place].  Presently unemployed but attends school full time    Family History  Problem Relation Age of Onset   Diabetes Unknown        multiple   Heart failure Paternal Grandmother    Breast cancer Paternal Grandmother    Heart disease Unknown        multiple   Breast cancer Mother     Past Surgical History:  Procedure Laterality Date   CERVIX LESION DESTRUCTION     DILATION AND CURETTAGE OF UTERUS     LEFT HEART CATHETERIZATION WITH CORONARY ANGIOGRAM N/A 05/21/2011   Procedure: LEFT HEART CATHETERIZATION WITH CORONARY ANGIOGRAM;  Surgeon: Sherren Mocha, MD;  Location: [facility] CATH LAB;  Service: Cardiovascular;  Laterality: N/A;   TUBAL LIGATION  1996    ROS: Review of Systems Negative except as stated above  PHYSICAL EXAM: BP 137/85    Pulse 90    Temp 98.2 F (36.8 C) (Oral)    Resp 16    Wt 217 lb 9.6 oz (98.7 kg)    SpO2 98%    BMI 32.13 kg/m   Wt Readings from Last 3 Encounters:  11/28/18 217 lb 9.6 oz (98.7 kg)  10/15/18 217 lb 6.4 oz (98.6 kg)  03/24/18 212 lb 6.4 oz (96.3 kg)    Physical Exam  General appearance - alert, well appearing, and  in no distress Mental status -patient tearful. Chest - clear to auscultation, no wheezes, rales or rhonchi, symmetric air entry Heart - normal rate, regular rhythm, normal S1, S2, no murmurs, rubs, clicks or gallops Extremities - peripheral pulses normal, no pedal edema, no clubbing or cyanosis MMSE - Mini Mental State Exam 11/28/2018  Orientation to time 5  Orientation to Place 5  Registration 3  Attention/ Calculation 0  Recall 3  Language- name 2 objects 2  Language- repeat 1  Language- follow 3 step command 3  Language- read & follow direction 1  Write a sentence 1  Copy design 1  Total score 25     Depression screen [facility] 2/9 11/28/2018 09/09/2017 08/08/2017  Decreased Interest 1 0 0  Down, Depressed, Hopeless 2 0 0  PHQ - 2 Score 3 0 0  Altered sleeping 3 - -  Tired, decreased energy 1 - -  Change in appetite 1 - -  Feeling bad or failure about yourself  1 - -  Trouble concentrating 1 - -  Moving slowly or fidgety/restless 1 - -  Suicidal thoughts 0 - -  PHQ-9 Score 11 - -  Difficult doing work/chores - - -   GAD 7 : Generalized Anxiety Score 11/28/2018 03/24/2018 08/08/2017 02/21/2017  Nervous, Anxious, on Edge 1 (No Data) 0 0  Control/stop worrying 2 (No Data) 0 0  Worry too much - different things 2 (No Data) 0 0  Trouble relaxing  2 (No Data) 0 0  Restless 2 (No Data) 0 0  Easily annoyed or irritable 2 (No Data) 0 0  Afraid - awful might happen 3 (No Data) 0 0  Total GAD 7 Score 14 - 0 0  Anxiety Difficulty - - - -     CMP Latest Ref Rng & Units 10/22/2018 10/15/2018 03/24/2018  Glucose 65 - 99 mg/dL 441(H) 439(H) 413(H)  BUN 6 - 24 mg/dL 16 16 16   Creatinine 0.57 - 1.00 mg/dL 1.02(H) 0.98 1.14(H)  Sodium 134 - 144 mmol/L 133(L) 132(L) 133(L)  Potassium 3.5 - 5.2 mmol/L 4.5 4.7 4.8  Chloride 96 - 106 mmol/L 94(L) 94(L) 96  CO2 20 - 29 mmol/L 24 24 21   Calcium 8.7 - 10.2 mg/dL 9.7 9.8 9.9  Total Protein 6.0 - 8.5 g/dL 6.8 - 7.1  Total Bilirubin 0.0 - 1.2 mg/dL  0.8 - 0.4  Alkaline Phos 39 - 117 IU/L 118(H) - 107  AST 0 - 40 IU/L 11 - 7  ALT 0 - 32 IU/L 14 - 8   Lipid Panel     Component Value Date/Time   CHOL 215 (H) 10/22/2018 0923   TRIG 209 (H) 10/22/2018 0923   HDL 37 (L) 10/22/2018 0923   CHOLHDL 5.8 (H) 10/22/2018 0923   CHOLHDL 6.4 (H) 05/05/2015 1645   VLDL 37 (H) 05/05/2015 1645   LDLCALC 140 (H) 10/22/2018 0923    CBC    Component Value Date/Time   WBC 7.4 10/22/2018 0923   WBC 8.9 07/25/2017 0837   RBC 5.06 10/22/2018 0923   RBC 3.71 (L) 07/25/2017 0837   HGB 14.5 10/22/2018 0923   HCT 44.6 10/22/2018 0923   PLT 337 10/22/2018 0923   MCV 88 10/22/2018 0923   MCH 28.7 10/22/2018 0923   MCH 26.4 07/25/2017 0837   MCHC 32.5 10/22/2018 0923   MCHC 30.6 07/25/2017 0837   RDW 13.7 10/22/2018 0923   LYMPHSABS 2.5 07/31/2017 1225   MONOABS 0.6 06/02/2017 0530   EOSABS 0.2 07/31/2017 1225   BASOSABS 0.1 07/31/2017 1225    ASSESSMENT AND PLAN: 1. Uncontrolled type 2 diabetes mellitus with peripheral neuropathy (HCC) Not at goal. Increase Lantus to 35 units at bedtime. We spoke about ways to help remind herself to take the short acting insulin with meals.  We came up with a method of her posting a message on her refrigerator in bold letters stating "take 5 units of Humalog every time I eat." - POCT glucose (manual entry) - Microalbumin / creatinine urine ratio - Insulin Glargine (LANTUS SOLOSTAR) 100 UNIT/ML Solostar Pen; INJECT 35 UNITS INTO THE SKIN AT BEDTIME.  Dispense: 15 mL; Refill: 2  2. Anxiety and depression Patient will continue speaking with her therapist.  She feels she would benefit from being on medication to help decrease anxiety and depression.  She denies any suicidal ideation at this time.  She is agreeable to being tried on Cymbalta.  I will have her follow-up with me again in 4 weeks to see how she is doing on the medication. - DULoxetine (CYMBALTA) 20 MG capsule; Take 1 capsule (20 mg total) by mouth  daily.  Dispense: 30 capsule; Refill: 5  3. Memory changes Advised patient that sometimes depression can cause memory changes.  We did a baseline Mini-Mental status exam today and she scored 25 out of 30.  We can recheck it again in several months.  4. Essential hypertension Not at goal but patient was upset and crying  today. Encouraged her to bring all medicines with her to her next visit so that we can do medication reconciliation.  5. Vitamin D deficiency Advised to bring the vitamin D supplement that she is taking so that we can verify the dose  6. Hyperlipidemia associated with type 2 diabetes mellitus ([place]) I have resent atorvastatin prescription to her pharmacy.  7. Influenza vaccination declined   Patient was given the opportunity to ask questions.  Patient verbalized understanding of the plan and was able to repeat key elements of the plan.   Orders Placed This Encounter  Procedures   Microalbumin / creatinine urine ratio   POCT glucose (manual entry)     Requested Prescriptions   Signed Prescriptions Disp Refills   albuterol (VENTOLIN HFA) 108 (90 Base) MCG/ACT inhaler 18 g 6    Sig: Inhale 2 puffs into the lungs every 6 (six) hours as needed for wheezing or shortness of breath.   Insulin Glargine (LANTUS SOLOSTAR) 100 UNIT/ML Solostar Pen 15 mL 2    Sig: INJECT 35 UNITS INTO THE SKIN AT BEDTIME.   DULoxetine (CYMBALTA) 20 MG capsule 30 capsule 5    Sig: Take 1 capsule (20 mg total) by mouth daily.    Return in about 1 month (around 12/29/2018) for depression/anxiey.  Karle Plumber, MD, FACP

## 2018-12-01 ENCOUNTER — Telehealth: Payer: Self-pay | Admitting: Internal Medicine

## 2018-12-01 NOTE — Telephone Encounter (Signed)
Patient call to report she is currently taking 10MG  of vitamin D, please follow up if more is needed

## 2018-12-03 NOTE — Telephone Encounter (Signed)
Will forward to pcp

## 2018-12-03 NOTE — Telephone Encounter (Signed)
Pt states it is 10. Pt states she is wanting to know how much Vit D do she need to be taking

## 2018-12-03 NOTE — Telephone Encounter (Signed)
Contacted pt and made aware that Vit D needs to be 800 iu daily. Pt states she understands

## 2018-12-29 ENCOUNTER — Ambulatory Visit: Payer: Medicaid Other | Admitting: Internal Medicine

## 2019-01-20 DIAGNOSIS — F332 Major depressive disorder, recurrent severe without psychotic features: Secondary | ICD-10-CM | POA: Diagnosis not present

## 2019-01-29 DIAGNOSIS — F4310 Post-traumatic stress disorder, unspecified: Secondary | ICD-10-CM | POA: Diagnosis not present

## 2019-02-10 DIAGNOSIS — F4310 Post-traumatic stress disorder, unspecified: Secondary | ICD-10-CM | POA: Diagnosis not present

## 2019-02-23 ENCOUNTER — Telehealth: Payer: Self-pay | Admitting: Internal Medicine

## 2019-02-23 NOTE — Telephone Encounter (Signed)
Patient called asking for a call back about breathing treatment. She wantns to do them at home

## 2019-02-23 NOTE — Telephone Encounter (Signed)
Retunred pt call and pt states she would like a breathing machine. I made pt aware that she will need to discuss this with Dr. Laural Benes on her recent visit.Pt doesn't have any questions or concerns

## 2019-03-03 ENCOUNTER — Ambulatory Visit: Payer: Medicaid Other | Admitting: Internal Medicine

## 2019-03-05 ENCOUNTER — Other Ambulatory Visit: Payer: Self-pay

## 2019-03-05 ENCOUNTER — Encounter: Payer: Self-pay | Admitting: Internal Medicine

## 2019-03-05 ENCOUNTER — Ambulatory Visit: Payer: Medicaid Other | Attending: Internal Medicine | Admitting: Internal Medicine

## 2019-03-05 VITALS — BP 146/91 | HR 105 | Temp 97.8°F | Resp 18 | Ht 70.8 in | Wt 218.0 lb

## 2019-03-05 DIAGNOSIS — E1165 Type 2 diabetes mellitus with hyperglycemia: Secondary | ICD-10-CM | POA: Diagnosis not present

## 2019-03-05 DIAGNOSIS — D649 Anemia, unspecified: Secondary | ICD-10-CM | POA: Insufficient documentation

## 2019-03-05 DIAGNOSIS — F329 Major depressive disorder, single episode, unspecified: Secondary | ICD-10-CM | POA: Diagnosis not present

## 2019-03-05 DIAGNOSIS — Z8249 Family history of ischemic heart disease and other diseases of the circulatory system: Secondary | ICD-10-CM | POA: Insufficient documentation

## 2019-03-05 DIAGNOSIS — I420 Dilated cardiomyopathy: Secondary | ICD-10-CM | POA: Diagnosis not present

## 2019-03-05 DIAGNOSIS — E1142 Type 2 diabetes mellitus with diabetic polyneuropathy: Secondary | ICD-10-CM | POA: Insufficient documentation

## 2019-03-05 DIAGNOSIS — Z794 Long term (current) use of insulin: Secondary | ICD-10-CM | POA: Insufficient documentation

## 2019-03-05 DIAGNOSIS — Z79899 Other long term (current) drug therapy: Secondary | ICD-10-CM | POA: Diagnosis not present

## 2019-03-05 DIAGNOSIS — E785 Hyperlipidemia, unspecified: Secondary | ICD-10-CM | POA: Diagnosis not present

## 2019-03-05 DIAGNOSIS — E1122 Type 2 diabetes mellitus with diabetic chronic kidney disease: Secondary | ICD-10-CM | POA: Insufficient documentation

## 2019-03-05 DIAGNOSIS — Z833 Family history of diabetes mellitus: Secondary | ICD-10-CM | POA: Insufficient documentation

## 2019-03-05 DIAGNOSIS — Z7982 Long term (current) use of aspirin: Secondary | ICD-10-CM | POA: Diagnosis not present

## 2019-03-05 DIAGNOSIS — J4520 Mild intermittent asthma, uncomplicated: Secondary | ICD-10-CM | POA: Diagnosis not present

## 2019-03-05 DIAGNOSIS — I130 Hypertensive heart and chronic kidney disease with heart failure and stage 1 through stage 4 chronic kidney disease, or unspecified chronic kidney disease: Secondary | ICD-10-CM | POA: Diagnosis not present

## 2019-03-05 DIAGNOSIS — Z590 Homelessness: Secondary | ICD-10-CM | POA: Diagnosis not present

## 2019-03-05 DIAGNOSIS — IMO0002 Reserved for concepts with insufficient information to code with codable children: Secondary | ICD-10-CM

## 2019-03-05 DIAGNOSIS — I10 Essential (primary) hypertension: Secondary | ICD-10-CM | POA: Diagnosis not present

## 2019-03-05 DIAGNOSIS — N183 Chronic kidney disease, stage 3 unspecified: Secondary | ICD-10-CM | POA: Insufficient documentation

## 2019-03-05 DIAGNOSIS — F419 Anxiety disorder, unspecified: Secondary | ICD-10-CM | POA: Insufficient documentation

## 2019-03-05 LAB — GLUCOSE, POCT (MANUAL RESULT ENTRY): POC Glucose: 110 mg/dl — AB (ref 70–99)

## 2019-03-05 LAB — POCT GLYCOSYLATED HEMOGLOBIN (HGB A1C): HbA1c, POC (controlled diabetic range): 8 % — AB (ref 0.0–7.0)

## 2019-03-05 MED ORDER — ALBUTEROL SULFATE (2.5 MG/3ML) 0.083% IN NEBU: 2.5000 mg | INHALATION_SOLUTION | Freq: Four times a day (QID) | RESPIRATORY_TRACT | 12 refills | Status: DC | PRN

## 2019-03-05 NOTE — Patient Instructions (Signed)
Please give patient an appointment with the clinical pharmacist in 2 weeks for recheck on her diabetes.  Please check your blood sugars at least once a day in the mornings and bring those readings in with you in 2 weeks to meet with the clinical pharmacist.

## 2019-03-05 NOTE — Progress Notes (Signed)
Patient ID: [name], female    DOB: [birth year]  MRN: [id number]  CC: Diabetes and Hypertension   Subjective: [name] is a 54 y.o. female who presents for chronic ds management.  Last seen 11/2018 Her concerns today include:  DMwith neuropathy. DCM/CHF with EF25% on 05/2016, HL, lung nodules, HTN, hx of carotid dissection, CKD 2-3, mild anemia, homeless, vit D def, anxiety/depression  Homelessness: Patient still living at a motel.  She tells me that she is on several waiting lists from different organizations for permanent housing.  She has a temporary drop looking at [employer] for the past 1 month.  DM type II: A1c today is at 8 down 4-1/2 points from last visit.  She checks blood sugars about 3 times a week.  Reports blood sugars are less than 200.  She is taking Lantus 32 units daily.  Admits that she is not taking NovoLog 3 times a day because she forgets to take it.  The only dose that she remembers to take consistently is around lunchtime.  She is tolerating the lower dose of metformin. -She is eating less. 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  HTN: Reports taking medications as prescribed.  She did not take isosorbide today because she was due for a refill which she picked up right before this visit.  She has not taken her second dose of hydralazine as yet for today. Doing well with limiting salt in the foods. No chest pains or lower extremity edema.  Gives history of asthma.  I do not see that she has had PFTs.  She was given albuterol inhaler after hospital admission last year for pneumonia.  She finds the inhaler helpful. Has wheezing and SOB at times.  She uses the albuterol inhaler about 3 times a week.  Some nights she feels that she would benefit from having a nebulizer treatment and is requesting a prescription for nebulizer machine.  She has occasional cough which she attributes to allergies.  No PND or  orthopnea   Patient Active Problem List   Diagnosis Date Noted  . Sinus tachycardia 10/15/2018  . Vitamin D deficiency 03/26/2018  . Homeless 03/24/2018  . Microalbuminuria 08/11/2017  . Anemia 08/08/2017  . Physical deconditioning   . Hyperlipidemia 02/21/2017  . Diabetic polyneuropathy associated with type 2 diabetes mellitus ([place]) 02/21/2017  . Lung nodules 07/16/2016  . Uncontrolled type 2 diabetes mellitus without complication, with long-term current use of insulin 07/16/2016  . DCM (dilated cardiomyopathy) ([place])   . Acute on chronic combined systolic and diastolic CHF (congestive heart failure) ([place]) 06/10/2016  . Essential hypertension 06/10/2016  . Dyslipidemia associated with type 2 diabetes mellitus ([place]) 06/10/2016     Current Outpatient Medications on File Prior to Visit  Medication Sig Dispense Refill  . ACCU-CHEK FASTCLIX LANCETS MISC 1 kit by Does not apply route 3 (three) times daily. Use to check blood sugar up to 3 times daily. 102 each 11  . albuterol (VENTOLIN HFA) 108 (90 Base) MCG/ACT inhaler Inhale 2 puffs into the lungs every 6 (six) hours as needed for wheezing or shortness of breath. 18 g 6  . aspirin 81 MG chewable tablet Chew 1 tablet (81 mg total) by mouth daily. 30 tablet 3  . blood glucose meter kit and supplies Dispense based on patient and insurance preference. Use up to four times daily as directed. (FOR ICD-9 250.00, 250.01). 1 each 0  . Blood  Glucose Monitoring Suppl (ACCU-CHEK GUIDE ME) w/Device KIT 1 kit by Does not apply route 3 (three) times daily. Use to check blood sugar up to 3 times daily. 1 kit 0  . carvedilol (COREG) 6.25 MG tablet Take 1 tablet (6.25 mg total) by mouth 2 (two) times daily with a meal. 60 tablet 11  . DULoxetine (CYMBALTA) 20 MG capsule Take 1 capsule (20 mg total) by mouth daily. 30 capsule 5  . furosemide (LASIX) 40 MG tablet Take 1 tablet (40 mg total) by mouth daily. For Fluid/Heart Failure 90 tablet 2  . glucose blood  (ACCU-CHEK GUIDE) test strip Use as instructed to check blood sugar up to 3 times daily. 100 each 11  . Insulin Glargine (LANTUS SOLOSTAR) 100 UNIT/ML Solostar Pen INJECT 35 UNITS INTO THE SKIN AT BEDTIME. 15 mL 2  . insulin lispro (HUMALOG KWIKPEN) 100 UNIT/ML KwikPen Inject 0.05 mLs (5 Units total) into the skin 3 (three) times daily. 15 mL 0  . metFORMIN (GLUCOPHAGE-XR) 500 MG 24 hr tablet Take 1 tablet (500 mg total) by mouth daily with breakfast. 90 tablet 2  . NOVOLOG FLEXPEN 100 UNIT/ML FlexPen INJECT 5 UNITS SUBCUTANEOUSLY THREE TIMES DAILY WITH MEALS    . spironolactone (ALDACTONE) 25 MG tablet Take 1 tablet (25 mg total) by mouth daily. 90 tablet 2  . TRUEPLUS PEN NEEDLES 31G X 6 MM MISC USE AS DIRECTED 100 each 1  . atorvastatin (LIPITOR) 40 MG tablet Take 1 tablet (40 mg total) by mouth daily. (Patient not taking: Reported on 03/05/2019) 90 tablet 3  . hydrALAZINE (APRESOLINE) 25 MG tablet Take 1 tablet (25 mg total) by mouth 3 (three) times daily. 90 tablet 6  . isosorbide mononitrate (IMDUR) 30 MG 24 hr tablet Take 1 tablet (30 mg total) by mouth daily. (Patient not taking: Reported on 03/05/2019) 30 tablet 11   No current facility-administered medications on file prior to visit.    Allergies  Allergen Reactions  . Lisinopril Hives and Swelling    Facial   . Sertraline Hives and Swelling    Facial   . Tramadol Hives and Swelling    facial  . Ibuprofen Hives and Swelling    Social History   Socioeconomic History  . Marital status: Divorced    Spouse name: Not on file  . Number of children: 2  . Years of education: Not on file  . Highest education level: Not on file  Occupational History  . Not on file  Tobacco Use  . Smoking status: Never Smoker  . Smokeless tobacco: Never Used  Substance and Sexual Activity  . Alcohol use: No  . Drug use: No  . Sexual activity: Yes    Birth control/protection: None  Other Topics Concern  . Not on file  Social History Narrative    Lives in [place].  Presently unemployed but attends school full time   Social Determinants of Health   Financial Resource Strain:   . Difficulty of Paying Living Expenses: Not on file  Food Insecurity:   . Worried About Charity fundraiser in the Last Year: Not on file  . Ran Out of Food in the Last Year: Not on file  Transportation Needs:   . Lack of Transportation (Medical): Not on file  . Lack of Transportation (Non-Medical): Not on file  Physical Activity:   . Days of Exercise per Week: Not on file  . Minutes of Exercise per Session: Not on file  Stress:   .  Feeling of Stress : Not on file  Social Connections:   . Frequency of Communication with Friends and Family: Not on file  . Frequency of Social Gatherings with Friends and Family: Not on file  . Attends Religious Services: Not on file  . Active Member of Clubs or Organizations: Not on file  . Attends Archivist Meetings: Not on file  . Marital Status: Not on file  Intimate Partner Violence:   . Fear of Current or Ex-Partner: Not on file  . Emotionally Abused: Not on file  . Physically Abused: Not on file  . Sexually Abused: Not on file    Family History  Problem Relation Age of Onset  . Diabetes Other        multiple  . Heart failure Paternal Grandmother   . Breast cancer Paternal Grandmother   . Heart disease Other        multiple  . Breast cancer Mother     Past Surgical History:  Procedure Laterality Date  . CERVIX LESION DESTRUCTION    . DILATION AND CURETTAGE OF UTERUS    . LEFT HEART CATHETERIZATION WITH CORONARY ANGIOGRAM N/A 05/21/2011   Procedure: LEFT HEART CATHETERIZATION WITH CORONARY ANGIOGRAM;  Surgeon: Sherren Mocha, MD;  Location: [facility] CATH LAB;  Service: Cardiovascular;  Laterality: N/A;  . TUBAL LIGATION  1996    ROS: Review of Systems Negative except as stated above  PHYSICAL EXAM: BP (!) 146/91 (BP Location: Left Arm)   Pulse (!) 105   Temp 97.8 F (36.6 C) (Oral)    Resp 18   Ht 5' 10.8" (1.798 m)   Wt 218 lb (98.9 kg)   LMP 02/14/2019 (Exact Date) Comment: lasted 2 days only  SpO2 100%   BMI 30.58 kg/m   Physical Exam  General appearance - alert, well appearing, and in no distress Mental status - normal mood, behavior, speech, dress, motor activity, and thought processes Neck - supple, no significant adenopathy Chest - clear to auscultation, no wheezes, rales or rhonchi, symmetric air entry Heart - normal rate, regular rhythm, normal S1, S2, no murmurs, rubs, clicks or gallops.  No PND Extremities - peripheral pulses normal, no pedal edema, no clubbing or cyanosis  Results for orders placed or performed in visit on 03/05/19  POCT glucose (manual entry)  Result Value Ref Range   POC Glucose 110 (A) 70 - 99 mg/dl  POCT glycosylated hemoglobin (Hb A1C)  Result Value Ref Range   Hemoglobin A1C     HbA1c POC (<> result, manual entry)     HbA1c, POC (prediabetic range)     HbA1c, POC (controlled diabetic range) 8.0 (A) 0.0 - 7.0 %     CMP Latest Ref Rng & Units 10/22/2018 10/15/2018 03/24/2018  Glucose 65 - 99 mg/dL 441(H) 439(H) 413(H)  BUN 6 - 24 mg/dL 16 16 16   Creatinine 0.57 - 1.00 mg/dL 1.02(H) 0.98 1.14(H)  Sodium 134 - 144 mmol/L 133(L) 132(L) 133(L)  Potassium 3.5 - 5.2 mmol/L 4.5 4.7 4.8  Chloride 96 - 106 mmol/L 94(L) 94(L) 96  CO2 20 - 29 mmol/L 24 24 21   Calcium 8.7 - 10.2 mg/dL 9.7 9.8 9.9  Total Protein 6.0 - 8.5 g/dL 6.8 - 7.1  Total Bilirubin 0.0 - 1.2 mg/dL 0.8 - 0.4  Alkaline Phos 39 - 117 IU/L 118(H) - 107  AST 0 - 40 IU/L 11 - 7  ALT 0 - 32 IU/L 14 - 8   Lipid Panel  Component Value Date/Time   CHOL 215 (H) 10/22/2018 0923   TRIG 209 (H) 10/22/2018 0923   HDL 37 (L) 10/22/2018 0923   CHOLHDL 5.8 (H) 10/22/2018 0923   CHOLHDL 6.4 (H) 05/05/2015 1645   VLDL 37 (H) 05/05/2015 1645   LDLCALC 140 (H) 10/22/2018 0923    CBC    Component Value Date/Time   WBC 7.4 10/22/2018 0923   WBC 8.9 07/25/2017 0837   RBC  5.06 10/22/2018 0923   RBC 3.71 (L) 07/25/2017 0837   HGB 14.5 10/22/2018 0923   HCT 44.6 10/22/2018 0923   PLT 337 10/22/2018 0923   MCV 88 10/22/2018 0923   MCH 28.7 10/22/2018 0923   MCH 26.4 07/25/2017 0837   MCHC 32.5 10/22/2018 0923   MCHC 30.6 07/25/2017 0837   RDW 13.7 10/22/2018 0923   LYMPHSABS 2.5 07/31/2017 1225   MONOABS 0.6 06/02/2017 0530   EOSABS 0.2 07/31/2017 1225   BASOSABS 0.1 07/31/2017 1225    ASSESSMENT AND PLAN:  1. Uncontrolled type 2 diabetes mellitus with peripheral neuropathy ([place]) Commended her on significant decrease in A1c, making some positive changes in her eating habits and for moving more.  I recommend that she check blood sugars at least daily before breakfast and bring in those readings in a few weeks to meet with the clinical pharmacist for further adjustment in insulin.  She will try to remember to take her Humalog TID with meals - POCT glucose (manual entry) - POCT glycosylated hemoglobin (Hb A1C) - Microalbumin / creatinine urine ratio  2. Essential hypertension Not at goal but patient has not taken isosorbide and second dose of hydralazine as yet for today.  She will continue current medications and low-salt diet.  3. Mild intermittent asthma without complication Prescription given for nebulizer machine and solution to use in it.  4. DCM (dilated cardiomyopathy) ([place]) Compensated.  Last saw cardiology 10/2018.  Continue current medications including spironolactone, hydralazine, isosorbide, furosemide    Patient was given the opportunity to ask questions.  Patient verbalized understanding of the plan and was able to repeat key elements of the plan.   Orders Placed This Encounter  Procedures  . Microalbumin / creatinine urine ratio  . POCT glucose (manual entry)  . POCT glycosylated hemoglobin (Hb A1C)     Requested Prescriptions   Signed Prescriptions Disp Refills  . albuterol (PROVENTIL) (2.5 MG/3ML) 0.083% nebulizer solution  75 mL 12    Sig: Take 3 mLs (2.5 mg total) by nebulization every 6 (six) hours as needed for wheezing or shortness of breath.    Return in about 3 months (around 06/03/2019).  Karle Plumber, MD, FACP

## 2019-03-05 NOTE — Progress Notes (Signed)
F /u DM and HTN  Requested asthma machine if possible

## 2019-03-06 LAB — MICROALBUMIN / CREATININE URINE RATIO
Creatinine, Urine: 87.7 mg/dL
Microalb/Creat Ratio: 8 mg/g creat (ref 0–29)
Microalbumin, Urine: 7 ug/mL

## 2019-03-19 ENCOUNTER — Telehealth: Payer: Self-pay | Admitting: *Deleted

## 2019-03-19 ENCOUNTER — Ambulatory Visit: Payer: Medicaid Other | Admitting: Pharmacist

## 2019-03-19 NOTE — Telephone Encounter (Signed)
A message was left, re: her follow up visit with Dr.Crenshaw. 

## 2019-03-26 ENCOUNTER — Other Ambulatory Visit: Payer: Self-pay | Admitting: Internal Medicine

## 2019-03-26 DIAGNOSIS — E1142 Type 2 diabetes mellitus with diabetic polyneuropathy: Secondary | ICD-10-CM

## 2019-03-26 DIAGNOSIS — IMO0002 Reserved for concepts with insufficient information to code with codable children: Secondary | ICD-10-CM

## 2019-03-26 DIAGNOSIS — I420 Dilated cardiomyopathy: Secondary | ICD-10-CM

## 2019-03-26 MED ORDER — METFORMIN HCL ER 500 MG PO TB24: 500.0000 mg | ORAL_TABLET | Freq: Every day | ORAL | 1 refills | Status: DC

## 2019-04-07 DIAGNOSIS — F4310 Post-traumatic stress disorder, unspecified: Secondary | ICD-10-CM | POA: Diagnosis not present

## 2019-04-13 DIAGNOSIS — F4310 Post-traumatic stress disorder, unspecified: Secondary | ICD-10-CM | POA: Diagnosis not present

## 2019-04-27 DIAGNOSIS — F4310 Post-traumatic stress disorder, unspecified: Secondary | ICD-10-CM | POA: Diagnosis not present

## 2019-05-05 DIAGNOSIS — F332 Major depressive disorder, recurrent severe without psychotic features: Secondary | ICD-10-CM | POA: Diagnosis not present

## 2019-05-22 DIAGNOSIS — F4310 Post-traumatic stress disorder, unspecified: Secondary | ICD-10-CM | POA: Diagnosis not present

## 2019-05-24 ENCOUNTER — Other Ambulatory Visit: Payer: Self-pay | Admitting: Internal Medicine

## 2019-05-24 DIAGNOSIS — E1142 Type 2 diabetes mellitus with diabetic polyneuropathy: Secondary | ICD-10-CM

## 2019-05-24 DIAGNOSIS — IMO0002 Reserved for concepts with insufficient information to code with codable children: Secondary | ICD-10-CM

## 2019-05-27 DIAGNOSIS — F332 Major depressive disorder, recurrent severe without psychotic features: Secondary | ICD-10-CM | POA: Diagnosis not present

## 2019-06-02 DIAGNOSIS — F4310 Post-traumatic stress disorder, unspecified: Secondary | ICD-10-CM | POA: Diagnosis not present

## 2019-06-11 ENCOUNTER — Other Ambulatory Visit: Payer: Self-pay

## 2019-06-11 ENCOUNTER — Ambulatory Visit: Payer: Medicaid Other | Attending: Internal Medicine | Admitting: Internal Medicine

## 2019-06-11 DIAGNOSIS — G8929 Other chronic pain: Secondary | ICD-10-CM

## 2019-06-11 DIAGNOSIS — M79605 Pain in left leg: Secondary | ICD-10-CM

## 2019-06-11 DIAGNOSIS — M79604 Pain in right leg: Secondary | ICD-10-CM | POA: Diagnosis not present

## 2019-06-11 DIAGNOSIS — M545 Low back pain: Secondary | ICD-10-CM | POA: Diagnosis not present

## 2019-06-11 NOTE — Progress Notes (Signed)
Virtual Visit via Video Note   This visit type was conducted due to national recommendations for restrictions regarding the COVID-19 Pandemic (e.g. social distancing) in an effort to limit this patient's exposure and mitigate transmission in our community.  Due to her co-morbid illnesses, this patient is at least at moderate risk for complications without adequate follow up.  This format is felt to be most appropriate for this patient at this time.  All issues noted in this document were discussed and addressed.  A limited physical exam was performed with this format.  Please refer to the patient's chart for her consent to telehealth for [facility].   Date:  06/15/2019   ID:  [name], DOB 1965-04-29, MRN [id number]  Patient Location:Home Provider Location: Home  PCP:  Ladell Pier, MD  Cardiologist:  Dr Stanford Breed  Evaluation Performed:  Follow-Up Visit  Chief Complaint:  FU CM/CHF  History of Present Illness:    FUCM/CHF. Echocardiogram in April of 2013 showed an ejection fraction of 35%, moderate left ventricular enlargement, mild to moderate mitral regurgitation. HIV negative and TSH normal. Cardiac catheterization in April of 2013 showed normal coronary arteries and an ejection fraction of 35-40%. Patient was treated with medications and her cardiomyopathy was felt possibly secondary to hypertension. Echocardiogram repeated in September of 2013. Her ejection fraction was 30-35%. Patient referred to Dr. Rayann Heman for consideration of ICD but the patient decided not to pursue. Also with h/o carotid dissection healed on CTA 4/18. Echocardiogram repeated April 2019 and showed ejection fraction 25 to 30%, moderate to severe mitral regurgitation, biatrial enlargement and mildly reduced LV function. Last seen 07/30/17. Since last seen, she denies dyspnea, chest pain, palpitations or syncope.  Occasional minimal pedal edema.  The patient does not have symptoms concerning for COVID-19  infection (fever, chills, cough, or new shortness of breath).    Past Medical History:  Diagnosis Date  . Asthma 05/31/2017  . Diabetes mellitus, type 2 (HCC)    A1C 7.6 April '13  . Hypertension   . Left leg pain 07/21/2015  . Nonischemic cardiomyopathy ([place])    Echo 05/19/11 EF 35% w/ mild-mod MR; R/L Heart Cath 05/21/11 - mildly elevated right heart pressures, normal left heart pressures, preserved cardiac output, widely patent coronary arteries without significant CAD and moderate global systolic LV dysfunction, EF 35-40%.  . 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 Systolic CHF ([place])    Echo 05/19/11 EF 35% w/ mild-mod MR   Past Surgical History:  Procedure Laterality Date  . CERVIX LESION DESTRUCTION    . DILATION AND CURETTAGE OF UTERUS    . LEFT HEART CATHETERIZATION WITH CORONARY ANGIOGRAM N/A 05/21/2011   Procedure: LEFT HEART CATHETERIZATION WITH CORONARY ANGIOGRAM;  Surgeon: Sherren Mocha, MD;  Location: [facility] CATH LAB;  Service: Cardiovascular;  Laterality: N/A;  . TUBAL LIGATION  1996     Current Meds  Medication Sig  . ACCU-CHEK FASTCLIX LANCETS MISC 1 kit by Does not apply route 3 (three) times daily. Use to check blood sugar up to 3 times daily.  Marland Kitchen albuterol (PROVENTIL) (2.5 MG/3ML) 0.083% nebulizer solution Take 3 mLs (2.5 mg total) by nebulization every 6 (six) hours as needed for wheezing or shortness of breath.  Marland Kitchen albuterol (VENTOLIN HFA) 108 (90 Base) MCG/ACT inhaler Inhale 2 puffs into the lungs every 6 (six) hours as needed for wheezing or shortness of breath.  Marland Kitchen aspirin 81 MG chewable tablet Chew 1 tablet (81  mg total) by mouth daily.  Marland Kitchen atorvastatin (LIPITOR) 40 MG tablet Take 1 tablet (40 mg total) by mouth daily.  . blood glucose meter kit and supplies Dispense based on patient and insurance preference. Use up to four times daily as directed. (FOR ICD-9 250.00, 250.01).  . Blood Glucose Monitoring Suppl (ACCU-CHEK GUIDE ME) w/Device KIT  1 kit by Does not apply route 3 (three) times daily. Use to check blood sugar up to 3 times daily.  . carvedilol (COREG) 6.25 MG tablet Take 1 tablet (6.25 mg total) by mouth 2 (two) times daily with a meal.  . DULoxetine (CYMBALTA) 20 MG capsule Take 1 capsule (20 mg total) by mouth daily.  . furosemide (LASIX) 40 MG tablet Take 1 tablet (40 mg total) by mouth daily. For Fluid/Heart Failure  . glucose blood (ACCU-CHEK GUIDE) test strip Use as instructed to check blood sugar up to 3 times daily.  . hydrALAZINE (APRESOLINE) 25 MG tablet Take 1 tablet (25 mg total) by mouth 3 (three) times daily.  . insulin glargine (LANTUS SOLOSTAR) 100 UNIT/ML Solostar Pen INJECT 35 UNITS SUBCUTANEOUSLY AT BEDTIME  . insulin lispro (HUMALOG KWIKPEN) 100 UNIT/ML KwikPen Inject 0.05 mLs (5 Units total) into the skin 3 (three) times daily.  . isosorbide mononitrate (IMDUR) 30 MG 24 hr tablet Take 1 tablet by mouth once daily  . metFORMIN (GLUCOPHAGE-XR) 500 MG 24 hr tablet Take 1 tablet (500 mg total) by mouth daily with breakfast.  . NOVOLOG FLEXPEN 100 UNIT/ML FlexPen INJECT 5 UNITS SUBCUTANEOUSLY THREE TIMES DAILY WITH MEALS  . spironolactone (ALDACTONE) 25 MG tablet Take 1 tablet (25 mg total) by mouth daily.  . TRUEPLUS PEN NEEDLES 31G X 6 MM MISC USE AS DIRECTED     Allergies:   Lisinopril, Sertraline, Tramadol, and Ibuprofen   Social History   Tobacco Use  . Smoking status: Never Smoker  . Smokeless tobacco: Never Used  Substance Use Topics  . Alcohol use: No  . Drug use: No     Family Hx: The patient's family history includes Breast cancer in her mother and paternal grandmother; Diabetes in an other family member; Heart disease in an other family member; Heart failure in her paternal grandmother.  ROS:   Please see the history of present illness.    Occasional back and leg pain but no Fever, chills  or productive cough All other systems reviewed and are negative.   Recent Labs: 10/15/2018: TSH  1.690 10/22/2018: ALT 14; BUN 16; Creatinine, Ser 1.02; Hemoglobin 14.5; Platelets 337; Potassium 4.5; Sodium 133   Recent Lipid Panel Lab Results  Component Value Date/Time   CHOL 215 (H) 10/22/2018 09:23 AM   TRIG 209 (H) 10/22/2018 09:23 AM   HDL 37 (L) 10/22/2018 09:23 AM   CHOLHDL 5.8 (H) 10/22/2018 09:23 AM   CHOLHDL 6.4 (H) 05/05/2015 04:45 PM   LDLCALC 140 (H) 10/22/2018 09:23 AM    Wt Readings from Last 3 Encounters:  03/05/19 218 lb (98.9 kg)  11/28/18 217 lb 9.6 oz (98.7 kg)  10/15/18 217 lb 6.4 oz (98.6 kg)     Objective:    Vital Signs:  There were no vitals taken for this visit.   VITAL SIGNS:  reviewed NAD Answers questions appropriately Normal affect Remainder of physical examination not performed (telehealth visit; coronavirus pandemic)  ASSESSMENT & PLAN:    1. Cardiomyopathy-nonischemic.  We will continue with beta-blockade.  No ACE inhibitor or ARB given history of angioedema.  Continue hydralazine/nitrates.  She  declined ICD in the past. 2. Chronic combined systolic/diastolic congestive heart failure-plan to continue Lasix and spironolactone at present dose.  Check potassium and renal function. 3. History of moderate to severe mitral regurgitation-plan follow-we will plan follow-up echocardiogram once blood pressure controlled. 4. Hypertension-patient states systolic blood pressure is in the 190 range.  We will increase hydralazine to 50 mg p.o. 3 times daily.  I will see her back in the office in 4 to 6 weeks and we will adjust medications as needed.  Once medications fully titrated we will repeat echocardiogram. 5. History of left common carotid artery dissection-healed on most recent CT scan. 6. History of noncompliance  COVID-19 Education: The importance of social distancing was discussed today.  Time:   Today, I have spent 16 minutes with the patient with telehealth technology discussing the above problems.     Medication Adjustments/Labs and  Tests Ordered: Current medicines are reviewed at length with the patient today.  Concerns regarding medicines are outlined above.   Tests Ordered: No orders of the defined types were placed in this encounter.   Medication Changes: No orders of the defined types were placed in this encounter.   Follow Up:  Either In Person or Virtual in 6 month(s)  Signed, Kirk Ruths, MD  06/15/2019 3:19 PM    [place]

## 2019-06-11 NOTE — Progress Notes (Signed)
Virtual Visit via Telephone Note Due to current restrictions/limitations of in-office visits due to the COVID-19 pandemic, this scheduled clinical appointment was converted to a telehealth visit  I connected with [name] on 06/11/19 at 5:12 p.m by telephone and verified that I am speaking with the correct person using two identifiers. I am in my office.  The patient is at home.  Only the patient and myself participated in this encounter.  I discussed the limitations, risks, security and privacy concerns of performing an evaluation and management service by telephone and the availability of in person appointments. I also discussed with the patient that there may be a patient responsible charge related to this service. The patient expressed understanding and agreed to proceed.   History of Present Illness: DMwith neuropathy. DCM/CHF with EF25% on 05/2016, HL, lung nodules, HTN, hx of carotid dissection, CKD 2-3, mild anemia, homeless, vit D def, anxiety/depression  C/o pain in legs and lower back since working 5-6 days a wk and [employer]. Does a lot of standing on concrete floor for 8 hr shifts and sometimes longer when they ask employees to work overtime.  She has a 10 min break in the mornings then 45 min lunch. No sitting on the job other than that. Has to have a 85% production.  Her production has been down the past few wks.  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  Building is hot without air condition Little swelling in legs at times. She wears good support shoes at work -Not sure how long she will be able to do this job. 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   -they are planning to go to 12 hr shift starting July.  She feels she definitely would not be able to do that physically. She feels that the long hours are negatively affecting her health.  -Having to take Tylenol 2 tablets twice a day.  Current  Outpatient Medications  Medication Instructions  . ACCU-CHEK FASTCLIX LANCETS MISC 1 kit, Does not apply, 3 times daily, Use to check blood sugar up to 3 times daily.  Marland Kitchen albuterol (PROVENTIL) 2.5 mg, Nebulization, Every 6 hours PRN  . albuterol (VENTOLIN HFA) 108 (90 Base) MCG/ACT inhaler 2 puffs, Inhalation, Every 6 hours PRN  . aspirin 81 mg, Oral, Daily  . atorvastatin (LIPITOR) 40 mg, Oral, Daily  . blood glucose meter kit and supplies Dispense based on patient and insurance preference. Use up to four times daily as directed. (FOR ICD-9 250.00, 250.01).  . Blood Glucose Monitoring Suppl (ACCU-CHEK GUIDE ME) w/Device KIT 1 kit, Does not apply, 3 times daily, Use to check blood sugar up to 3 times daily.  . carvedilol (COREG) 6.25 mg, Oral, 2 times daily with meals  . DULoxetine (CYMBALTA) 20 mg, Oral, Daily  . furosemide (LASIX) 40 mg, Oral, Daily, For Fluid/Heart Failure  . glucose blood (ACCU-CHEK GUIDE) test strip Use as instructed to check blood sugar up to 3 times daily.  . hydrALAZINE (APRESOLINE) 25 mg, Oral, 3 times daily  . insulin glargine (LANTUS SOLOSTAR) 100 UNIT/ML Solostar Pen INJECT 35 UNITS SUBCUTANEOUSLY AT BEDTIME  . insulin lispro (HUMALOG KWIKPEN) 5 Units, Subcutaneous, 3 times daily  . isosorbide mononitrate (IMDUR) 30 MG 24 hr tablet Take 1 tablet by mouth once daily  . metFORMIN (GLUCOPHAGE-XR) 500 mg, Oral, Daily with breakfast  . NOVOLOG FLEXPEN 100 UNIT/ML FlexPen INJECT 5 UNITS  SUBCUTANEOUSLY THREE TIMES DAILY WITH MEALS  . spironolactone (ALDACTONE) 25 mg, Oral, Daily  . TRUEPLUS PEN NEEDLES 31G X 6 MM MISC USE AS DIRECTED      Observations/Objective: No direct observation done as this was a telephone encounter  Assessment and Plan: 1. Chronic bilateral low back pain without sciatica 2. Pain in both lower extremities -Back pain associated with prolonged standing at work. I recommend doing some stretching and flexion of the trunk intermittently during her  shifts and making sure that she wear good support shoes. Use a heating pad to the lower back after her shifts and try to elevate and massage the legs. -Patient would like to work 3 days a week instead of 6. She states that she would make adequate money to keep her bills covered working three 8-hour shifts per week but would need a note from her physician. I told her that I will write the letter for her.   Follow Up Instructions: 4 to 6 weeks for chronic disease management   I discussed the assessment and treatment plan with the patient. The patient was provided an opportunity to ask questions and all were answered. The patient agreed with the plan and demonstrated an understanding of the instructions.   The patient was advised to call back or seek an in-person evaluation if the symptoms worsen or if the condition fails to improve as anticipated.  I provided 16 minutes of non-face-to-face time during this encounter.   Karle Plumber, MD

## 2019-06-11 NOTE — Progress Notes (Signed)
Pt states she has been having to take tylenol everyday for her b/l leg pain  Pt states she is also having lower back pain

## 2019-06-15 ENCOUNTER — Encounter: Payer: Self-pay | Admitting: Cardiology

## 2019-06-15 ENCOUNTER — Telehealth (INDEPENDENT_AMBULATORY_CARE_PROVIDER_SITE_OTHER): Payer: Medicaid Other | Admitting: Cardiology

## 2019-06-15 DIAGNOSIS — I420 Dilated cardiomyopathy: Secondary | ICD-10-CM | POA: Diagnosis not present

## 2019-06-15 DIAGNOSIS — E1169 Type 2 diabetes mellitus with other specified complication: Secondary | ICD-10-CM

## 2019-06-15 DIAGNOSIS — E785 Hyperlipidemia, unspecified: Secondary | ICD-10-CM

## 2019-06-15 DIAGNOSIS — I428 Other cardiomyopathies: Secondary | ICD-10-CM

## 2019-06-15 DIAGNOSIS — I10 Essential (primary) hypertension: Secondary | ICD-10-CM

## 2019-06-15 MED ORDER — SPIRONOLACTONE 25 MG PO TABS: 25.0000 mg | ORAL_TABLET | Freq: Every day | ORAL | 2 refills | Status: DC

## 2019-06-15 MED ORDER — HYDRALAZINE HCL 50 MG PO TABS: 50.0000 mg | ORAL_TABLET | Freq: Three times a day (TID) | ORAL | 3 refills | Status: DC

## 2019-06-15 NOTE — Patient Instructions (Signed)
Medication Instructions:  INCREASE HYDRALAZINE TO 50 MG THREE TIMES DAILY= 2 OF THE 25 MG TABLETS THREE TIMES DAILY  *If you need a refill on your cardiac medications before your next appointment, please call your pharmacy*   Lab Work: Your physician recommends that you return for lab work in: ONE WEEK PRIOR TO EATING  If you have labs (blood work) drawn today and your tests are completely normal, you will receive your results only by: Marland Kitchen MyChart Message (if you have MyChart) OR . A paper copy in the mail If you have any lab test that is abnormal or we need to change your treatment, we will call you to review the results.   Follow-Up: At [facility], you and your health needs are our priority.  As part of our continuing mission to provide you with exceptional heart care, we have created designated Provider Care Teams.  These Care Teams include your primary Cardiologist (physician) and Advanced Practice Providers (APPs -  Physician Assistants and Nurse Practitioners) who all work together to provide you with the care you need, when you need it.  We recommend signing up for the patient portal called "MyChart".  Sign up information is provided on this After Visit Summary.  MyChart is used to connect with patients for Virtual Visits (Telemedicine).  Patients are able to view lab/test results, encounter notes, upcoming appointments, etc.  Non-urgent messages can be sent to your provider as well.   To learn more about what you can do with MyChart, go to [email or web address].    Your next appointment:   4-6  week(s)  The format for your next appointment:   In Person  Provider:   Olga Millers, MD

## 2019-06-16 DIAGNOSIS — F332 Major depressive disorder, recurrent severe without psychotic features: Secondary | ICD-10-CM | POA: Diagnosis not present

## 2019-06-22 DIAGNOSIS — I10 Essential (primary) hypertension: Secondary | ICD-10-CM | POA: Diagnosis not present

## 2019-06-22 DIAGNOSIS — F4310 Post-traumatic stress disorder, unspecified: Secondary | ICD-10-CM | POA: Diagnosis not present

## 2019-06-22 DIAGNOSIS — E785 Hyperlipidemia, unspecified: Secondary | ICD-10-CM | POA: Diagnosis not present

## 2019-06-22 DIAGNOSIS — E1169 Type 2 diabetes mellitus with other specified complication: Secondary | ICD-10-CM | POA: Diagnosis not present

## 2019-06-22 LAB — COMPREHENSIVE METABOLIC PANEL
ALT: 6 IU/L (ref 0–32)
AST: 8 IU/L (ref 0–40)
Albumin/Globulin Ratio: 1.7 (ref 1.2–2.2)
Albumin: 4.1 g/dL (ref 3.8–4.9)
Alkaline Phosphatase: 80 IU/L (ref 39–117)
BUN/Creatinine Ratio: 14 (ref 9–23)
BUN: 15 mg/dL (ref 6–24)
Bilirubin Total: 0.7 mg/dL (ref 0.0–1.2)
CO2: 23 mmol/L (ref 20–29)
Calcium: 9.3 mg/dL (ref 8.7–10.2)
Chloride: 101 mmol/L (ref 96–106)
Creatinine, Ser: 1.07 mg/dL — ABNORMAL HIGH (ref 0.57–1.00)
GFR calc Af Amer: 69 mL/min/{1.73_m2} (ref >59)
GFR calc non Af Amer: 60 mL/min/{1.73_m2} (ref >59)
Globulin, Total: 2.4 g/dL (ref 1.5–4.5)
Glucose: 230 mg/dL — ABNORMAL HIGH (ref 65–99)
Potassium: 4.5 mmol/L (ref 3.5–5.2)
Sodium: 136 mmol/L (ref 134–144)
Total Protein: 6.5 g/dL (ref 6.0–8.5)

## 2019-06-22 LAB — LIPID PANEL
Chol/HDL Ratio: 6 ratio — ABNORMAL HIGH (ref 0.0–4.4)
Cholesterol, Total: 251 mg/dL — ABNORMAL HIGH (ref 100–199)
HDL: 42 mg/dL (ref >39)
LDL Chol Calc (NIH): 181 mg/dL — ABNORMAL HIGH (ref 0–99)
Triglycerides: 152 mg/dL — ABNORMAL HIGH (ref 0–149)
VLDL Cholesterol Cal: 28 mg/dL (ref 5–40)

## 2019-06-24 ENCOUNTER — Telehealth: Payer: Self-pay | Admitting: *Deleted

## 2019-06-24 DIAGNOSIS — E1169 Type 2 diabetes mellitus with other specified complication: Secondary | ICD-10-CM

## 2019-06-24 MED ORDER — ROSUVASTATIN CALCIUM 40 MG PO TABS: 40.0000 mg | ORAL_TABLET | Freq: Every day | ORAL | 3 refills | Status: DC

## 2019-06-24 NOTE — Telephone Encounter (Signed)
Spoke with pt, Aware of dr crenshaw's recommendations. New script sent to the pharmacy and Lab orders mailed to the pt  

## 2019-06-24 NOTE — Telephone Encounter (Addendum)
-----   Message from Lewayne Bunting, MD sent at 06/22/2019  5:45 PM EDT ----- Crestor 20 mg daily; lipids and liver 12 weeks Olga Millers  Left message for pt to call

## 2019-07-05 ENCOUNTER — Other Ambulatory Visit: Payer: Self-pay | Admitting: Internal Medicine

## 2019-07-05 DIAGNOSIS — I420 Dilated cardiomyopathy: Secondary | ICD-10-CM

## 2019-07-07 DIAGNOSIS — F4310 Post-traumatic stress disorder, unspecified: Secondary | ICD-10-CM | POA: Diagnosis not present

## 2019-07-08 NOTE — Progress Notes (Signed)
HPI: FUCM/CHF. Echocardiogram in April of 2013 showed an ejection fraction of 35%, moderate left ventricular enlargement, mild to moderate mitral regurgitation. HIV negative and TSH normal. Cardiac catheterization in April of 2013 showed normal coronary arteries and an ejection fraction of 35-40%. Patient was treated with medications and her cardiomyopathy was felt possibly secondary to hypertension. Echocardiogram repeated in September of 2013. Her ejection fraction was 30-35%. Patient referred to Dr. Rayann Heman for consideration of ICD but the patient decided not to pursue. Also with h/o carotid dissection healed on CTA 4/18.Echocardiogram repeated April 2019 and showed ejection fraction 25 to 30%, moderate to severe mitral regurgitation, biatrial enlargement and mildly reduced LV function.   Seen in the office early May and blood pressure elevated.  Hydralazine was increased.  Sincelast seen,  she denies dyspnea, chest pain, palpitations or syncope.  Current Outpatient Medications  Medication Sig Dispense Refill  . ACCU-CHEK FASTCLIX LANCETS MISC 1 kit by Does not apply route 3 (three) times daily. Use to check blood sugar up to 3 times daily. 102 each 11  . albuterol (PROVENTIL) (2.5 MG/3ML) 0.083% nebulizer solution Take 3 mLs (2.5 mg total) by nebulization every 6 (six) hours as needed for wheezing or shortness of breath. 75 mL 12  . albuterol (VENTOLIN HFA) 108 (90 Base) MCG/ACT inhaler Inhale 2 puffs into the lungs every 6 (six) hours as needed for wheezing or shortness of breath. 18 g 6  . aspirin 81 MG chewable tablet Chew 1 tablet (81 mg total) by mouth daily. 30 tablet 3  . blood glucose meter kit and supplies Dispense based on patient and insurance preference. Use up to four times daily as directed. (FOR ICD-9 250.00, 250.01). 1 each 0  . Blood Glucose Monitoring Suppl (ACCU-CHEK GUIDE ME) w/Device KIT 1 kit by Does not apply route 3 (three) times daily. Use to check blood sugar up to  3 times daily. 1 kit 0  . carvedilol (COREG) 6.25 MG tablet Take 1 tablet (6.25 mg total) by mouth 2 (two) times daily with a meal. 60 tablet 11  . DULoxetine (CYMBALTA) 20 MG capsule Take 1 capsule (20 mg total) by mouth daily. 30 capsule 5  . furosemide (LASIX) 40 MG tablet Take 1 tablet (40 mg total) by mouth daily. For Fluid/Heart Failure 90 tablet 2  . glucose blood (ACCU-CHEK GUIDE) test strip Use as instructed to check blood sugar up to 3 times daily. 100 each 11  . hydrALAZINE (APRESOLINE) 50 MG tablet Take 1 tablet (50 mg total) by mouth 3 (three) times daily. 270 tablet 3  . insulin glargine (LANTUS SOLOSTAR) 100 UNIT/ML Solostar Pen INJECT 35 UNITS SUBCUTANEOUSLY AT BEDTIME 15 mL 0  . insulin lispro (HUMALOG KWIKPEN) 100 UNIT/ML KwikPen Inject 0.05 mLs (5 Units total) into the skin 3 (three) times daily. 15 mL 0  . isosorbide mononitrate (IMDUR) 30 MG 24 hr tablet Take 1 tablet by mouth once daily 90 tablet 0  . metFORMIN (GLUCOPHAGE-XR) 500 MG 24 hr tablet Take 1 tablet (500 mg total) by mouth daily with breakfast. 90 tablet 1  . NOVOLOG FLEXPEN 100 UNIT/ML FlexPen INJECT 5 UNITS SUBCUTANEOUSLY THREE TIMES DAILY WITH MEALS    . rosuvastatin (CRESTOR) 40 MG tablet Take 1 tablet (40 mg total) by mouth daily. 90 tablet 3  . spironolactone (ALDACTONE) 25 MG tablet Take 1 tablet (25 mg total) by mouth daily. 90 tablet 2  . TRUEPLUS PEN NEEDLES 31G X 6 MM MISC USE AS  DIRECTED 100 each 1   No current facility-administered medications for this visit.     Past Medical History:  Diagnosis Date  . Asthma 05/31/2017  . Diabetes mellitus, type 2 (HCC)    A1C 7.6 April '13  . Hypertension   . Left leg pain 07/21/2015  . Nonischemic cardiomyopathy ([place])    Echo 05/19/11 EF 35% w/ mild-mod MR; R/L Heart Cath 05/21/11 - mildly elevated right heart pressures, normal left heart pressures, preserved cardiac output, widely patent coronary arteries without significant CAD and moderate global systolic LV  dysfunction, EF 35-40%.  . 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 Systolic CHF ([place])    Echo 05/19/11 EF 35% w/ mild-mod MR    Past Surgical History:  Procedure Laterality Date  . CERVIX LESION DESTRUCTION    . DILATION AND CURETTAGE OF UTERUS    . LEFT HEART CATHETERIZATION WITH CORONARY ANGIOGRAM N/A 05/21/2011   Procedure: LEFT HEART CATHETERIZATION WITH CORONARY ANGIOGRAM;  Surgeon: Sherren Mocha, MD;  Location: [facility] C[facility] LAB;  Service: Cardiovascular;  Laterality: N/A;  . TUBAL LIGATION  1996    Social History   Socioeconomic History  . Marital status: Divorced    Spouse name: Not on file  . Number of children: 2  . Years of education: Not on file  . Highest education level: Not on file  Occupational History  . Not on file  Tobacco Use  . Smoking status: Never Smoker  . Smokeless tobacco: Never Used  Substance and Sexual Activity  . Alcohol use: No  . Drug use: No  . Sexual activity: Yes    Birth control/protection: None  Other Topics Concern  . Not on file  Social History Narrative   Lives in [place].  Presently unemployed but attends school full time   Social Determinants of Radio broadcast assistant Strain:   . Difficulty of Paying Living Expenses:   Food Insecurity:   . Worried About Charity fundraiser in the Last Year:   . Arboriculturist in the Last Year:   Transportation Needs:   . Film/video editor (Medical):   Marland Kitchen Lack of Transportation (Non-Medical):   Physical Activity:   . Days of Exercise per Week:   . Minutes of Exercise per Session:   Stress:   . Feeling of Stress :   Social Connections:   . Frequency of Communication with Friends and Family:   . Frequency of Social Gatherings with Friends and Family:   . Attends Religious Services:   . Active Member of Clubs or Organizations:   . Attends Archivist Meetings:   Marland Kitchen Marital Status:   Intimate Partner Violence:   . Fear of Current or  Ex-Partner:   . Emotionally Abused:   Marland Kitchen Physically Abused:   . Sexually Abused:     Family History  Problem Relation Age of Onset  . Diabetes Other        multiple  . Heart failure Paternal Grandmother   . Breast cancer Paternal Grandmother   . Heart disease Other        multiple  . Breast cancer Mother     ROS: no fevers or chills, productive cough, hemoptysis, dysphasia, odynophagia, melena, hematochezia, dysuria, hematuria, rash, seizure activity, orthopnea, PND, pedal edema, claudication. Remaining systems are negative.  Physical Exam: Well-developed well-nourished in no acute distress.  Skin is warm and dry.  HEENT is normal.  Neck is supple.  Chest is clear to auscultation with normal expansion.  Cardiovascular exam is regular rate and rhythm.  Abdominal exam nontender or distended. No masses palpated. Extremities show no edema. neuro grossly intact  ECG-sinus rhythm at a rate of 88, inferolateral T wave inversion, prolonged QT interval.  Personally reviewed  A/P  1 cardiomyopathy-nonischemic in etiology.  She has a history of angioedema with ACE inhibitor/ARB.  Continue hydralazine/nitrates and beta-blocker (increase coreg to 12.5 BID).  She continues to decline ICD.  2 chronic combined systolic/diastolic congestive heart failure-she appears to be euvolemic today.  Continue Lasix and spironolactone at present dose.  3 hypertension-blood pressure improving.  Increase carvedilol to 12.5 mg twice daily and follow.  4 history of moderate to severe mitral regurgitation-repeat echocardiogram.  5 history of left common carotid artery dissection-noted to be healed on most recent CT scan.  6 history of noncompliance.  Kirk Ruths, MD

## 2019-07-14 ENCOUNTER — Ambulatory Visit (INDEPENDENT_AMBULATORY_CARE_PROVIDER_SITE_OTHER): Payer: Medicaid Other | Admitting: Cardiology

## 2019-07-14 ENCOUNTER — Encounter: Payer: Self-pay | Admitting: Cardiology

## 2019-07-14 ENCOUNTER — Other Ambulatory Visit: Payer: Self-pay

## 2019-07-14 VITALS — BP 125/90 | HR 88 | Temp 95.0°F | Ht 69.0 in | Wt 214.4 lb

## 2019-07-14 DIAGNOSIS — I5021 Acute systolic (congestive) heart failure: Secondary | ICD-10-CM

## 2019-07-14 DIAGNOSIS — I428 Other cardiomyopathies: Secondary | ICD-10-CM | POA: Diagnosis not present

## 2019-07-14 DIAGNOSIS — I10 Essential (primary) hypertension: Secondary | ICD-10-CM | POA: Diagnosis not present

## 2019-07-14 DIAGNOSIS — I340 Nonrheumatic mitral (valve) insufficiency: Secondary | ICD-10-CM

## 2019-07-14 MED ORDER — CARVEDILOL 12.5 MG PO TABS: 12.5000 mg | ORAL_TABLET | Freq: Two times a day (BID) | ORAL | 3 refills | Status: DC

## 2019-07-14 NOTE — Patient Instructions (Signed)
Medication Instructions:  INCREASE CARVEDILOL TO 12.5 MG TWICE DAILY= 2 OF THE 6.25 MG TABLETS TWICE DAILY  *If you need a refill on your cardiac medications before your next appointment, please call your pharmacy*   Lab Work: If you have labs (blood work) drawn today and your tests are completely normal, you will receive your results only by: Marland Kitchen MyChart Message (if you have MyChart) OR . A paper copy in the mail If you have any lab test that is abnormal or we need to change your treatment, we will call you to review the results.   Testing/Procedures: Your physician has requested that you have an echocardiogram. Echocardiography is a painless test that uses sound waves to create images of your heart. It provides your doctor with information about the size and shape of your heart and how well your heart's chambers and valves are working. This procedure takes approximately one hour. There are no restrictions for this procedure.[address]     Follow-Up: At [facility], you and your health needs are our priority.  As part of our continuing mission to provide you with exceptional heart care, we have created designated Provider Care Teams.  These Care Teams include your primary Cardiologist (physician) and Advanced Practice Providers (APPs -  Physician Assistants and Nurse Practitioners) who all work together to provide you with the care you need, when you need it.  We recommend signing up for the patient portal called "MyChart".  Sign up information is provided on this After Visit Summary.  MyChart is used to connect with patients for Virtual Visits (Telemedicine).  Patients are able to view lab/test results, encounter notes, upcoming appointments, etc.  Non-urgent messages can be sent to your provider as well.   To learn more about what you can do with MyChart, go to [email or web address].    Your next appointment:   6 month(s)  The format for your next appointment:   Either  In Person or Virtual  Provider:   You may see Olga Millers, MD or one of the following Advanced Practice Providers on your designated Care Team:    Corine Shelter, PA-C  [place], [place]  Edd Fabian, [place]

## 2019-07-22 DIAGNOSIS — F4310 Post-traumatic stress disorder, unspecified: Secondary | ICD-10-CM | POA: Diagnosis not present

## 2019-07-24 ENCOUNTER — Other Ambulatory Visit: Payer: Self-pay | Admitting: Internal Medicine

## 2019-07-24 DIAGNOSIS — IMO0002 Reserved for concepts with insufficient information to code with codable children: Secondary | ICD-10-CM

## 2019-07-28 ENCOUNTER — Ambulatory Visit: Payer: Medicaid Other | Attending: Internal Medicine | Admitting: Internal Medicine

## 2019-07-28 ENCOUNTER — Other Ambulatory Visit: Payer: Self-pay

## 2019-07-28 ENCOUNTER — Encounter: Payer: Self-pay | Admitting: Internal Medicine

## 2019-07-28 VITALS — BP 111/71 | HR 82 | Temp 96.4°F | Resp 16 | Wt 218.6 lb

## 2019-07-28 DIAGNOSIS — E1136 Type 2 diabetes mellitus with diabetic cataract: Secondary | ICD-10-CM | POA: Insufficient documentation

## 2019-07-28 DIAGNOSIS — N183 Chronic kidney disease, stage 3 unspecified: Secondary | ICD-10-CM | POA: Diagnosis not present

## 2019-07-28 DIAGNOSIS — D649 Anemia, unspecified: Secondary | ICD-10-CM | POA: Diagnosis not present

## 2019-07-28 DIAGNOSIS — Z590 Homelessness: Secondary | ICD-10-CM | POA: Diagnosis not present

## 2019-07-28 DIAGNOSIS — E1165 Type 2 diabetes mellitus with hyperglycemia: Secondary | ICD-10-CM

## 2019-07-28 DIAGNOSIS — E6609 Other obesity due to excess calories: Secondary | ICD-10-CM

## 2019-07-28 DIAGNOSIS — I420 Dilated cardiomyopathy: Secondary | ICD-10-CM | POA: Diagnosis not present

## 2019-07-28 DIAGNOSIS — I129 Hypertensive chronic kidney disease with stage 1 through stage 4 chronic kidney disease, or unspecified chronic kidney disease: Secondary | ICD-10-CM | POA: Insufficient documentation

## 2019-07-28 DIAGNOSIS — Z885 Allergy status to narcotic agent status: Secondary | ICD-10-CM | POA: Insufficient documentation

## 2019-07-28 DIAGNOSIS — E559 Vitamin D deficiency, unspecified: Secondary | ICD-10-CM | POA: Insufficient documentation

## 2019-07-28 DIAGNOSIS — Z560 Unemployment, unspecified: Secondary | ICD-10-CM | POA: Diagnosis not present

## 2019-07-28 DIAGNOSIS — E785 Hyperlipidemia, unspecified: Secondary | ICD-10-CM | POA: Diagnosis not present

## 2019-07-28 DIAGNOSIS — Z1211 Encounter for screening for malignant neoplasm of colon: Secondary | ICD-10-CM | POA: Diagnosis not present

## 2019-07-28 DIAGNOSIS — E1129 Type 2 diabetes mellitus with other diabetic kidney complication: Secondary | ICD-10-CM | POA: Insufficient documentation

## 2019-07-28 DIAGNOSIS — E1142 Type 2 diabetes mellitus with diabetic polyneuropathy: Secondary | ICD-10-CM | POA: Diagnosis not present

## 2019-07-28 DIAGNOSIS — IMO0002 Reserved for concepts with insufficient information to code with codable children: Secondary | ICD-10-CM

## 2019-07-28 DIAGNOSIS — Z794 Long term (current) use of insulin: Secondary | ICD-10-CM | POA: Insufficient documentation

## 2019-07-28 DIAGNOSIS — I5043 Acute on chronic combined systolic (congestive) and diastolic (congestive) heart failure: Secondary | ICD-10-CM | POA: Diagnosis not present

## 2019-07-28 DIAGNOSIS — F4310 Post-traumatic stress disorder, unspecified: Secondary | ICD-10-CM | POA: Diagnosis not present

## 2019-07-28 DIAGNOSIS — Z6832 Body mass index (BMI) 32.0-32.9, adult: Secondary | ICD-10-CM | POA: Diagnosis not present

## 2019-07-28 DIAGNOSIS — Z886 Allergy status to analgesic agent status: Secondary | ICD-10-CM | POA: Insufficient documentation

## 2019-07-28 DIAGNOSIS — Z833 Family history of diabetes mellitus: Secondary | ICD-10-CM | POA: Insufficient documentation

## 2019-07-28 DIAGNOSIS — E1122 Type 2 diabetes mellitus with diabetic chronic kidney disease: Secondary | ICD-10-CM | POA: Diagnosis not present

## 2019-07-28 DIAGNOSIS — J4520 Mild intermittent asthma, uncomplicated: Secondary | ICD-10-CM | POA: Insufficient documentation

## 2019-07-28 DIAGNOSIS — I10 Essential (primary) hypertension: Secondary | ICD-10-CM | POA: Diagnosis not present

## 2019-07-28 DIAGNOSIS — Z7982 Long term (current) use of aspirin: Secondary | ICD-10-CM | POA: Insufficient documentation

## 2019-07-28 DIAGNOSIS — I428 Other cardiomyopathies: Secondary | ICD-10-CM | POA: Insufficient documentation

## 2019-07-28 DIAGNOSIS — Z79899 Other long term (current) drug therapy: Secondary | ICD-10-CM | POA: Diagnosis not present

## 2019-07-28 DIAGNOSIS — F329 Major depressive disorder, single episode, unspecified: Secondary | ICD-10-CM | POA: Insufficient documentation

## 2019-07-28 DIAGNOSIS — Z6835 Body mass index (BMI) 35.0-35.9, adult: Secondary | ICD-10-CM | POA: Insufficient documentation

## 2019-07-28 DIAGNOSIS — R809 Proteinuria, unspecified: Secondary | ICD-10-CM | POA: Insufficient documentation

## 2019-07-28 DIAGNOSIS — F419 Anxiety disorder, unspecified: Secondary | ICD-10-CM | POA: Insufficient documentation

## 2019-07-28 DIAGNOSIS — Z888 Allergy status to other drugs, medicaments and biological substances status: Secondary | ICD-10-CM | POA: Insufficient documentation

## 2019-07-28 DIAGNOSIS — Z8249 Family history of ischemic heart disease and other diseases of the circulatory system: Secondary | ICD-10-CM | POA: Insufficient documentation

## 2019-07-28 LAB — GLUCOSE, POCT (MANUAL RESULT ENTRY): POC Glucose: 247 mg/dl — AB (ref 70–99)

## 2019-07-28 LAB — POCT GLYCOSYLATED HEMOGLOBIN (HGB A1C): HbA1c, POC (controlled diabetic range): 8.7 % — AB (ref 0.0–7.0)

## 2019-07-28 MED ORDER — LANTUS SOLOSTAR 100 UNIT/ML ~~LOC~~ SOPN: PEN_INJECTOR | SUBCUTANEOUS | 5 refills | Status: DC

## 2019-07-28 MED ORDER — VICTOZA 18 MG/3ML ~~LOC~~ SOPN: PEN_INJECTOR | SUBCUTANEOUS | 6 refills | Status: DC

## 2019-07-28 NOTE — Progress Notes (Signed)
Patient ID: [name], female    DOB: 1965/04/30  MRN: [id number]  CC: Diabetes and Hypertension   Subjective: [name] is a 54 y.o. female who presents for 6 wks f/u chronic ds management Her concerns today include:  DMwith neuropathy. DCM/sys and diastolic CHF with EM75% on 05/2016, HL, lung nodules resolved, HTN, hx of LT common carotid dissection, CKD 2-3, mild anemia, homeless, vit D def, anxiety/depression, mild intermittent asthma  On last visit she did complain of pain in the back in the legs which she associated with prolonged standing and walking on the job.  I had written a letter requesting that her hours be reduced.  Now working 4 hours 5 days a week.  Reports she is doing much better with this arrangement.    DIABETES TYPE 2 Last A1C:   Results for orders placed or performed in visit on 07/28/19  POCT glucose (manual entry)  Result Value Ref Range   POC Glucose 247 (A) 70 - 99 mg/dl  POCT glycosylated hemoglobin (Hb A1C)  Result Value Ref Range   Hemoglobin A1C     HbA1c POC (<> result, manual entry)     HbA1c, POC (prediabetic range)     HbA1c, POC (controlled diabetic range) 8.7 (A) 0.0 - 7.0 %    Med Adherence:  _0  Yes - consistent with taking Lantus 34 units.  Told by pharmacy that it needs authorization by me for RF.  Has not taken Novolog in 3 wks because meal times not on schedule due to new work hrs.  Medication side effects:  _1  Yes    _2  No Home Monitoring?  _3  Yes    _4  No Home glucose results range: Diet Adherence:  Doing better but over eat last Monday for her birthday Exercise: _5  Yes - a lot of walking at work and goes to park daily to walk     Hypoglycemic episodes?: _6  Yes    _7  No Numbness of the feet? _8  Yes    _9  No Retinopathy hx? _10  Yes    _11  No Last eye exam:  Had recent eye exam at [facility].  Told she has mild cataract Comments:   HYPERTENSION/DCM nonischemic/sys and diastolic CHF: saw Crenshaw 2 wks ago. Coreg increased  to 12.5 mg BID.  Echo ordered which she will have done next month. Currently taking: see medication list Med Adherence: _12  Yes    _13  No Medication side effects: _14  Yes    _15  No Adherence with salt restriction: _16  Yes    _17  No Home Monitoring?: _18  Yes    _19  No Monitoring Frequency: _20  Yes    _21  No Home BP results range: _22  Yes    _23  No SOB? _24  Yes - due to asthma. Uses Albuterol inhaler  it 2-3 x a wk at work.  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   Chest Pain?: _25  Yes    _26  No Leg swelling?: _27  Yes has chronic swelling in the left ankle which she attributes to an accident that she had back in 2000.    Headaches?: _28  Yes at times   _29  No Dizziness? _30  Yes    _31  No Comments:   HM:  Had COVID vaccine.  Over due for colon cancer screen agreeable to doing Cologuard testing.  Patient Active Problem List   Diagnosis Date Noted  . Sinus tachycardia 10/15/2018  . Vitamin D deficiency 03/26/2018  . Homeless 03/24/2018  . Microalbuminuria 08/11/2017  .  Anemia 08/08/2017  . Physical deconditioning   . Hyperlipidemia 02/21/2017  . Diabetic polyneuropathy associated with type 2 diabetes mellitus ([place]) 02/21/2017  . Lung nodules 07/16/2016  . Uncontrolled type 2 diabetes mellitus without complication, with long-term current use of insulin 07/16/2016  . DCM (dilated cardiomyopathy) ([place])   . Acute on chronic combined systolic and diastolic CHF (congestive heart failure) ([place]) 06/10/2016  . Essential hypertension 06/10/2016  . Dyslipidemia associated with type 2 diabetes mellitus ([place]) 06/10/2016     Current Outpatient Medications on File Prior to Visit  Medication Sig Dispense Refill  . ACCU-CHEK FASTCLIX LANCETS MISC 1 kit by Does not apply route 3 (three) times daily. Use to check blood sugar up to 3 times daily. 102 each 11  . albuterol (PROVENTIL) (2.5 MG/3ML) 0.083% nebulizer solution Take 3 mLs (2.5 mg total) by nebulization every 6 (six) hours as needed for wheezing or  shortness of breath. 75 mL 12  . albuterol (VENTOLIN HFA) 108 (90 Base) MCG/ACT inhaler Inhale 2 puffs into the lungs every 6 (six) hours as needed for wheezing or shortness of breath. 18 g 6  . aspirin 81 MG chewable tablet Chew 1 tablet (81 mg total) by mouth daily. 30 tablet 3  . blood glucose meter kit and supplies Dispense based on patient and insurance preference. Use up to four times daily as directed. (FOR ICD-9 250.00, 250.01). 1 each 0  . Blood Glucose Monitoring Suppl (ACCU-CHEK GUIDE ME) w/Device KIT 1 kit by Does not apply route 3 (three) times daily. Use to check blood sugar up to 3 times daily. 1 kit 0  . carvedilol (COREG) 12.5 MG tablet Take 1 tablet (12.5 mg total) by mouth 2 (two) times daily with a meal. 180 tablet 3  . DULoxetine (CYMBALTA) 20 MG capsule Take 1 capsule (20 mg total) by mouth daily. 30 capsule 5  . furosemide (LASIX) 40 MG tablet Take 1 tablet (40 mg total) by mouth daily. For Fluid/Heart Failure 90 tablet 2  . glucose blood (ACCU-CHEK GUIDE) test strip Use as instructed to check blood sugar up to 3 times daily. 100 each 11  . hydrALAZINE (APRESOLINE) 50 MG tablet Take 1 tablet (50 mg total) by mouth 3 (three) times daily. 270 tablet 3  . insulin lispro (HUMALOG KWIKPEN) 100 UNIT/ML KwikPen Inject 0.05 mLs (5 Units total) into the skin 3 (three) times daily. 15 mL 0  . isosorbide mononitrate (IMDUR) 30 MG 24 hr tablet Take 1 tablet by mouth once daily 90 tablet 0  . metFORMIN (GLUCOPHAGE-XR) 500 MG 24 hr tablet Take 1 tablet (500 mg total) by mouth daily with breakfast. 90 tablet 1  . rosuvastatin (CRESTOR) 40 MG tablet Take 1 tablet (40 mg total) by mouth daily. 90 tablet 3  . spironolactone (ALDACTONE) 25 MG tablet Take 1 tablet (25 mg total) by mouth daily. 90 tablet 2  . TRUEPLUS PEN NEEDLES 31G X 6 MM MISC USE AS DIRECTED 100 each 1   No current facility-administered medications on file prior to visit.    Allergies  Allergen Reactions  . Lisinopril  Hives and Swelling    Facial   . Sertraline Hives and Swelling    Facial   . Tramadol Hives and Swelling    facial  . Ibuprofen Hives and Swelling    Social History   Socioeconomic History  . Marital status: Divorced    Spouse name: Not on file  . Number of children: 2  . Years of education:  Not on file  . Highest education level: Not on file  Occupational History  . Not on file  Tobacco Use  . Smoking status: Never Smoker  . Smokeless tobacco: Never Used  Vaping Use  . Vaping Use: Never used  Substance and Sexual Activity  . Alcohol use: No  . Drug use: No  . Sexual activity: Yes    Birth control/protection: None  Other Topics Concern  . Not on file  Social History Narrative   Lives in [place].  Presently unemployed but attends school full time   Social Determinants of Radio broadcast assistant Strain:   . Difficulty of Paying Living Expenses:   Food Insecurity:   . Worried About Charity fundraiser in the Last Year:   . Arboriculturist in the Last Year:   Transportation Needs:   . Film/video editor (Medical):   Marland Kitchen Lack of Transportation (Non-Medical):   Physical Activity:   . Days of Exercise per Week:   . Minutes of Exercise per Session:   Stress:   . Feeling of Stress :   Social Connections:   . Frequency of Communication with Friends and Family:   . Frequency of Social Gatherings with Friends and Family:   . Attends Religious Services:   . Active Member of Clubs or Organizations:   . Attends Archivist Meetings:   Marland Kitchen Marital Status:   Intimate Partner Violence:   . Fear of Current or Ex-Partner:   . Emotionally Abused:   Marland Kitchen Physically Abused:   . Sexually Abused:     Family History  Problem Relation Age of Onset  . Diabetes Other        multiple  . Heart failure Paternal Grandmother   . Breast cancer Paternal Grandmother   . Heart disease Other        multiple  . Breast cancer Mother     Past Surgical History:  Procedure  Laterality Date  . CERVIX LESION DESTRUCTION    . DILATION AND CURETTAGE OF UTERUS    . LEFT HEART CATHETERIZATION WITH CORONARY ANGIOGRAM N/A 05/21/2011   Procedure: LEFT HEART CATHETERIZATION WITH CORONARY ANGIOGRAM;  Surgeon: Sherren Mocha, MD;  Location: [facility] CATH LAB;  Service: Cardiovascular;  Laterality: N/A;  . TUBAL LIGATION  1996    ROS: Review of Systems Negative except as stated above  PHYSICAL EXAM: BP 111/71   Pulse 82   Temp (!) 96.4 F (35.8 C)   Resp 16   Wt 218 lb 9.6 oz (99.2 kg)   SpO2 100%   BMI 32.28 kg/m   Wt Readings from Last 3 Encounters:  07/28/19 218 lb 9.6 oz (99.2 kg)  07/14/19 214 lb 6.4 oz (97.3 kg)  03/05/19 218 lb (98.9 kg)    Physical Exam  General appearance - alert, well appearing, and in no distress Mental status - normal mood, behavior, speech, dress, motor activity, and thought processes Neck - supple, no significant adenopathy Chest - clear to auscultation, no wheezes, rales or rhonchi, symmetric air entry Heart - normal rate, regular rhythm, normal S1, S2, no murmurs, rubs, clicks or gallops Extremities -mild edema of the left ankle Depression screen [facility] 2/9 07/28/2019 06/11/2019 03/05/2019  Decreased Interest 1 0 0  Down, Depressed, Hopeless 2 0 0  PHQ - 2 Score 3 0 0  Altered sleeping 2 - 0  Tired, decreased energy 2 - 0  Change in appetite 2 - 0  Feeling bad or failure  about yourself  0 - 0  Trouble concentrating 0 - 0  Moving slowly or fidgety/restless 1 - 0  Suicidal thoughts 0 - 0  PHQ-9 Score 10 - 0  Difficult doing work/chores - - -    Diabetic Foot Exam - Simple   Simple Foot Form Visual Inspection No deformities, no ulcerations, no other skin breakdown bilaterally: Yes Sensation Testing Intact to touch and monofilament testing bilaterally: Yes Pulse Check Posterior Tibialis and Dorsalis pulse intact bilaterally: Yes Comments      CMP Latest Ref Rng & Units 06/22/2019 10/22/2018 10/15/2018  Glucose 65 - 99 mg/dL  230(H) 441(H) 439(H)  BUN 6 - 24 mg/dL _0 Creatinine 0.57 - 1.00 mg/dL 1.07(H) 1.02(H) 0.98  Sodium 134 - 144 mmol/L 136 133(L) 132(L)  Potassium 3.5 - 5.2 mmol/L 4.5 4.5 4.7  Chloride 96 - 106 mmol/L 101 94(L) 94(L)  CO2 20 - 29 mmol/L _1 Calcium 8.7 - 10.2 mg/dL 9.3 9.7 9.8  Total Protein 6.0 - 8.5 g/dL 6.5 6.8 -  Total Bilirubin 0.0 - 1.2 mg/dL 0.7 0.8 -  Alkaline Phos 39 - 117 IU/L 80 118(H) -  AST 0 - 40 IU/L 8 11 -  ALT 0 - 32 IU/L 6 14 -   Lipid Panel     Component Value Date/Time   CHOL 251 (H) 06/22/2019 0946   TRIG 152 (H) 06/22/2019 0946   HDL 42 06/22/2019 0946   CHOLHDL 6.0 (H) 06/22/2019 0946   CHOLHDL 6.4 (H) 05/05/2015 1645   VLDL 37 (H) 05/05/2015 1645   LDLCALC 181 (H) 06/22/2019 0946    CBC    Component Value Date/Time   WBC 7.4 10/22/2018 0923   WBC 8.9 07/25/2017 0837   RBC 5.06 10/22/2018 0923   RBC 3.71 (L) 07/25/2017 0837   HGB 14.5 10/22/2018 0923   HCT 44.6 10/22/2018 0923   PLT 337 10/22/2018 0923   MCV 88 10/22/2018 0923   MCH 28.7 10/22/2018 0923   MCH 26.4 07/25/2017 0837   MCHC 32.5 10/22/2018 0923   MCHC 30.6 07/25/2017 0837   RDW 13.7 10/22/2018 0923   LYMPHSABS 2.5 07/31/2017 1225   MONOABS 0.6 06/02/2017 0530   EOSABS 0.2 07/31/2017 1225   BASOSABS 0.1 07/31/2017 1225    ASSESSMENT AND PLAN:  1. Uncontrolled type 2 diabetes mellitus with peripheral neuropathy (HCC) Uncontrolled.  She is consistent with taking Lantus but not NovoLog.  I suspect the needle sticks 4 times a day is a bit too much for her.  I discussed stopping the NovoLog and putting her on Victoza or instead.  I went over how the medication works including delayed gastric emptying which helps to better control the blood sugars and also may result in some weight loss.  Informed patient that the medication sometimes can cause nausea/vomiting.  Advised to call me and let me know if she develops any vomiting or abdominal pain when she starts the medicine.   Dietary counseling given.  Continue regular exercise - POCT glucose (manual entry) - POCT glycosylated hemoglobin (Hb A1C) - insulin glargine (LANTUS SOLOSTAR) 100 UNIT/ML Solostar Pen; INJECT 35 UNITS SUBCUTANEOUSLY AT BEDTIME  Dispense: 15 mL; Refill: 5 - liraglutide (VICTOZA) 18 MG/3ML SOPN; Start 0.19m SQ once a day for 7 days, then increase to 1.251monce a day  Dispense: 2 pen; Refill: 6  2. Essential hypertension At goal.  Continue current medications  3. Nonischemic cardiomyopathy ([place].  Continue current medications.  Followed by cardiology  4. Screening for colon cancer Discussed colon cancer screening methods.  She is agreeable to doing Cologuard - Cologuard  5. Class 1 obesity due to excess calories with serious comorbidity and body mass index (BMI) of 32.0 to 32.9 in adult See #1 above.    Patient was given the opportunity to ask questions.  Patient verbalized understanding of the plan and was able to repeat key elements of the plan.   Orders Placed This Encounter  Procedures  . Cologuard  . POCT glucose (manual entry)  . POCT glycosylated hemoglobin (Hb A1C)     Requested Prescriptions   Signed Prescriptions Disp Refills  . insulin glargine (LANTUS SOLOSTAR) 100 UNIT/ML Solostar Pen 15 mL 5    Sig: INJECT 35 UNITS SUBCUTANEOUSLY AT BEDTIME  . liraglutide (VICTOZA) 18 MG/3ML SOPN 2 pen 6    Sig: Start 0.2m SQ once a day for 7 days, then increase to 1.210monce a day    Return in about 3 months (around 10/28/2019).  DeKarle PlumberMD, FACP

## 2019-07-28 NOTE — Patient Instructions (Signed)
Stop NovoLog insulin which is your short acting insulin.  Continue Lantus 34 units at bedtime.  We have added a new injectable medication called Victoza which she will use once a day.  The medication can cause some nausea initially.  If you have any vomiting or abdominal pain with the medication you can stop it and let us know.

## 2019-07-29 ENCOUNTER — Encounter: Payer: Self-pay | Admitting: *Deleted

## 2019-08-04 ENCOUNTER — Other Ambulatory Visit (HOSPITAL_COMMUNITY): Payer: Medicaid Other

## 2019-08-07 DIAGNOSIS — Z1211 Encounter for screening for malignant neoplasm of colon: Secondary | ICD-10-CM | POA: Diagnosis not present

## 2019-08-07 DIAGNOSIS — F4310 Post-traumatic stress disorder, unspecified: Secondary | ICD-10-CM | POA: Diagnosis not present

## 2019-08-08 DIAGNOSIS — H40033 Anatomical narrow angle, bilateral: Secondary | ICD-10-CM | POA: Diagnosis not present

## 2019-08-08 DIAGNOSIS — H2513 Age-related nuclear cataract, bilateral: Secondary | ICD-10-CM | POA: Diagnosis not present

## 2019-08-11 LAB — COLOGUARD
COLOGUARD: NEGATIVE
Cologuard: NEGATIVE

## 2019-08-12 DIAGNOSIS — F4310 Post-traumatic stress disorder, unspecified: Secondary | ICD-10-CM | POA: Diagnosis not present

## 2019-08-14 DIAGNOSIS — F4310 Post-traumatic stress disorder, unspecified: Secondary | ICD-10-CM | POA: Diagnosis not present

## 2019-08-19 ENCOUNTER — Other Ambulatory Visit: Payer: Self-pay

## 2019-08-19 ENCOUNTER — Ambulatory Visit (HOSPITAL_COMMUNITY): Payer: Medicaid Other | Attending: Cardiology

## 2019-08-19 DIAGNOSIS — I340 Nonrheumatic mitral (valve) insufficiency: Secondary | ICD-10-CM

## 2019-08-19 DIAGNOSIS — I428 Other cardiomyopathies: Secondary | ICD-10-CM | POA: Diagnosis not present

## 2019-08-24 DIAGNOSIS — F332 Major depressive disorder, recurrent severe without psychotic features: Secondary | ICD-10-CM | POA: Diagnosis not present

## 2019-09-01 DIAGNOSIS — F4310 Post-traumatic stress disorder, unspecified: Secondary | ICD-10-CM | POA: Diagnosis not present

## 2019-09-09 DIAGNOSIS — F4310 Post-traumatic stress disorder, unspecified: Secondary | ICD-10-CM | POA: Diagnosis not present

## 2019-09-10 ENCOUNTER — Other Ambulatory Visit: Payer: Self-pay | Admitting: Internal Medicine

## 2019-09-10 ENCOUNTER — Other Ambulatory Visit: Payer: Self-pay | Admitting: Cardiology

## 2019-09-10 DIAGNOSIS — I5022 Chronic systolic (congestive) heart failure: Secondary | ICD-10-CM

## 2019-09-10 DIAGNOSIS — I10 Essential (primary) hypertension: Secondary | ICD-10-CM

## 2019-09-10 DIAGNOSIS — IMO0002 Reserved for concepts with insufficient information to code with codable children: Secondary | ICD-10-CM

## 2019-09-10 DIAGNOSIS — I428 Other cardiomyopathies: Secondary | ICD-10-CM

## 2019-09-14 ENCOUNTER — Other Ambulatory Visit: Payer: Self-pay | Admitting: Internal Medicine

## 2019-09-14 DIAGNOSIS — IMO0002 Reserved for concepts with insufficient information to code with codable children: Secondary | ICD-10-CM

## 2019-09-14 DIAGNOSIS — E1142 Type 2 diabetes mellitus with diabetic polyneuropathy: Secondary | ICD-10-CM

## 2019-09-14 DIAGNOSIS — I10 Essential (primary) hypertension: Secondary | ICD-10-CM

## 2019-09-16 DIAGNOSIS — F332 Major depressive disorder, recurrent severe without psychotic features: Secondary | ICD-10-CM | POA: Diagnosis not present

## 2019-09-16 NOTE — Telephone Encounter (Signed)
Pt is completely out of her medication, is requesting the accurate dose to be sent in so she does not go without her medication Best contact: [phone] please advise if patient needs to do anything to further assist this refill.

## 2019-09-17 MED ORDER — METFORMIN HCL ER 500 MG PO TB24: 500.0000 mg | ORAL_TABLET | Freq: Every day | ORAL | 1 refills | Status: DC

## 2019-09-17 NOTE — Addendum Note (Signed)
Addended by: Phillips Odor on: 09/17/2019 10:17 AM   Modules accepted: Orders

## 2019-09-17 NOTE — Telephone Encounter (Signed)
Phone call to [facility]. On [address].  Spoke with Pharmacy Tech; verified pt. does have refills avail. On Hydralazine, and it will be filled at this time.

## 2019-09-17 NOTE — Telephone Encounter (Signed)
Requested medication (s) are due for refill today:  Yes  Requested medication (s) are on the active medication list:  Yes  Future visit scheduled:  Yes  Last Refill: 03/26/19; #90/ RF x 1  Note to clinic:  Pt. Almost out of Metformin; has approx. 5 pills remaining.  Sending back to clinic for review, as Metformin was not noted in pt's office note 07/28/19; there was only notation on change in Insulin, but Metformin was not addressed; please review/advise.   Requested Prescriptions  Pending Prescriptions Disp Refills   metFORMIN (GLUCOPHAGE-XR) 500 MG 24 hr tablet 90 tablet 1    Sig: Take 1 tablet (500 mg total) by mouth daily with breakfast.      Endocrinology:  Diabetes - Biguanides Failed - 09/17/2019 10:17 AM      Failed - Cr in normal range and within 360 days    Creat  Date Value Ref Range Status  06/29/2016 0.92 0.50 - 1.10 mg/dL Final   Creatinine, Ser  Date Value Ref Range Status  06/22/2019 1.07 (H) 0.57 - 1.00 mg/dL Final          Failed - HBA1C is between 0 and 7.9 and within 180 days    HbA1c, POC (controlled diabetic range)  Date Value Ref Range Status  07/28/2019 8.7 (A) 0.0 - 7.0 % Final          Passed - eGFR in normal range and within 360 days    GFR, Est African American  Date Value Ref Range Status  05/05/2015 80 >=60 mL/min Final   GFR calc Af Amer  Date Value Ref Range Status  06/22/2019 69 >59 mL/min/1.73 Final    Comment:    **[facility] currently reports eGFR in compliance with the current**   recommendations of the [employer]. [facility] will   update reporting as new guidelines are published from the NKF-ASN   Task force.    GFR, Est Non African American  Date Value Ref Range Status  05/05/2015 70 >=60 mL/min Final    Comment:      The estimated GFR is a calculation valid for adults (>=68 years old) that uses the CKD-EPI algorithm to adjust for age and sex. It is   not to be used for children, pregnant women, hospitalized  patients,    patients on dialysis, or with rapidly changing kidney function. According to the NKDEP, eGFR >89 is normal, 60-89 shows mild impairment, 30-59 shows moderate impairment, 15-29 shows severe impairment and <15 is ESRD.      GFR calc non Af Amer  Date Value Ref Range Status  06/22/2019 60 >59 mL/min/1.73 Final   GFR  Date Value Ref Range Status  10/30/2012 83.74 >60.00 mL/min Final          Passed - Valid encounter within last 6 months    Recent Outpatient Visits           1 month ago Uncontrolled type 2 diabetes mellitus with peripheral neuropathy [facility])   [place] Karle Plumber B, MD   3 months ago Chronic bilateral low back pain without sciatica   [place], MD   6 months ago Uncontrolled type 2 diabetes mellitus with peripheral neuropathy [facility])   [place], MD   9 months ago Uncontrolled type 2 diabetes mellitus with peripheral neuropathy [facility])   [place],  MD   11 months ago Uncontrolled type 2 diabetes mellitus with peripheral neuropathy ([place])   [place], MD       Future Appointments             In 1 month Wynetta Emery Dalbert Batman, MD [place]   In 4 months Crenshaw, Denice Bors, MD [facility]             Refused Prescriptions Disp Refills   hydrALAZINE (APRESOLINE) 25 MG tablet [Pharmacy Med Name: hydrALAZINE HCl 25 MG Oral Tablet] 90 tablet 0    Sig: TAKE 1 TABLET BY MOUTH THREE TIMES DAILY      Cardiovascular:  Vasodilators Passed - 09/17/2019 10:17 AM      Passed - HCT in normal range and within 360 days    Hematocrit  Date Value Ref Range Status  10/22/2018 44.6 34.0 - 46.6 % Final          Passed - HGB in normal range and within 360 days    Hemoglobin  Date  Value Ref Range Status  10/22/2018 14.5 11.1 - 15.9 g/dL Final   Total hemoglobin  Date Value Ref Range Status  02/23/2015 13.5 12.0 - 16.0 g/dL Final          Passed - RBC in normal range and within 360 days    RBC  Date Value Ref Range Status  10/22/2018 5.06 3.77 - 5.28 x10E6/uL Final  07/25/2017 3.71 (L) 3.87 - 5.11 MIL/uL Final          Passed - WBC in normal range and within 360 days    WBC  Date Value Ref Range Status  10/22/2018 7.4 3.4 - 10.8 x10E3/uL Final  07/25/2017 8.9 4.0 - 10.5 K/uL Final          Passed - PLT in normal range and within 360 days    Platelets  Date Value Ref Range Status  10/22/2018 337 150 - 450 x10E3/uL Final          Passed - Last BP in normal range    BP Readings from Last 1 Encounters:  07/28/19 111/71          Passed - Valid encounter within last 12 months    Recent Outpatient Visits           1 month ago Uncontrolled type 2 diabetes mellitus with peripheral neuropathy ([place])   [place] Karle Plumber B, MD   3 months ago Chronic bilateral low back pain without sciatica   [place], Deborah B, MD   6 months ago Uncontrolled type 2 diabetes mellitus with peripheral neuropathy [facility])   [place], Deborah B, MD   9 months ago Uncontrolled type 2 diabetes mellitus with peripheral neuropathy [facility])   [place] Karle Plumber B, MD   11 months ago Uncontrolled type 2 diabetes mellitus with peripheral neuropathy [facility])   [place], Deborah B, MD       Future Appointments             In 1 month Wynetta Emery Dalbert Batman, MD [place]   In 4 months Crenshaw, Denice Bors, MD [facility], [facility]              metFORMIN (GLUCOPHAGE-XR) 500 MG  24 hr tablet [Pharmacy Med Name: metFORMIN HCl ER 500 MG Oral Tablet  Extended Release 24 Hour] 90 tablet 0    Sig: Take 1 tablet by mouth once daily with breakfast      Endocrinology:  Diabetes - Biguanides Failed - 09/17/2019 10:17 AM      Failed - Cr in normal range and within 360 days    Creat  Date Value Ref Range Status  06/29/2016 0.92 0.50 - 1.10 mg/dL Final   Creatinine, Ser  Date Value Ref Range Status  06/22/2019 1.07 (H) 0.57 - 1.00 mg/dL Final          Failed - HBA1C is between 0 and 7.9 and within 180 days    HbA1c, POC (controlled diabetic range)  Date Value Ref Range Status  07/28/2019 8.7 (A) 0.0 - 7.0 % Final          Passed - eGFR in normal range and within 360 days    GFR, Est African American  Date Value Ref Range Status  05/05/2015 80 >=60 mL/min Final   GFR calc Af Amer  Date Value Ref Range Status  06/22/2019 69 >59 mL/min/1.73 Final    Comment:    **[facility] currently reports eGFR in compliance with the current**   recommendations of the [employer]. [facility] will   update reporting as new guidelines are published from the NKF-ASN   Task force.    GFR, Est Non African American  Date Value Ref Range Status  05/05/2015 70 >=60 mL/min Final    Comment:      The estimated GFR is a calculation valid for adults (>=28 years old) that uses the CKD-EPI algorithm to adjust for age and sex. It is   not to be used for children, pregnant women, hospitalized patients,    patients on dialysis, or with rapidly changing kidney function. According to the NKDEP, eGFR >89 is normal, 60-89 shows mild impairment, 30-59 shows moderate impairment, 15-29 shows severe impairment and <15 is ESRD.      GFR calc non Af Amer  Date Value Ref Range Status  06/22/2019 60 >59 mL/min/1.73 Final   GFR  Date Value Ref Range Status  10/30/2012 83.74 >60.00 mL/min Final          Passed - Valid encounter within last 6 months    Recent Outpatient Visits           1 month ago Uncontrolled type 2 diabetes mellitus with  peripheral neuropathy [facility])   [place] Karle Plumber B, MD   3 months ago Chronic bilateral low back pain without sciatica   [place] Karle Plumber B, MD   6 months ago Uncontrolled type 2 diabetes mellitus with peripheral neuropathy [facility])   [place], MD   9 months ago Uncontrolled type 2 diabetes mellitus with peripheral neuropathy [facility])   [place], MD   11 months ago Uncontrolled type 2 diabetes mellitus with peripheral neuropathy [facility])   [place], MD       Future Appointments             In 1 month Wyn[place] Emery Dalbert Batman, MD [place]   In 4 months Crenshaw, Denice Bors, MD [place], [facility]

## 2019-09-17 NOTE — Telephone Encounter (Signed)
Phone call to pt.  Verified that she is requesting refills on Hydralazine and Metformin.  Pt. Stated she was told by the pharmacy to get in touch with her doctor re: refills.  Informed that the Hydralazine should have refills avail.,but that Metformin does need new refills.  Advised pt. Will order refills on Metformin, since she was just eval. In office in June.  Will contact pharmacy re: Hydralazine.  Verb. Understanding.

## 2019-09-17 NOTE — Telephone Encounter (Signed)
Notified pt. Of Hydralazine avail. At the pharmacy.  Advised pt. That will send Metformin refill to Dr. Laural Benes to review, as it was not noted in office note from June visit.  Pt. Verb. Understanding.

## 2019-09-17 NOTE — Addendum Note (Signed)
Addended by: Lois Huxley, Jeannett Senior L on: 09/17/2019 02:08 PM   Modules accepted: Orders

## 2019-09-18 DIAGNOSIS — F4310 Post-traumatic stress disorder, unspecified: Secondary | ICD-10-CM | POA: Diagnosis not present

## 2019-09-23 DIAGNOSIS — F4310 Post-traumatic stress disorder, unspecified: Secondary | ICD-10-CM | POA: Diagnosis not present

## 2019-09-29 DIAGNOSIS — F4310 Post-traumatic stress disorder, unspecified: Secondary | ICD-10-CM | POA: Diagnosis not present

## 2019-10-06 DIAGNOSIS — F4310 Post-traumatic stress disorder, unspecified: Secondary | ICD-10-CM | POA: Diagnosis not present

## 2019-10-13 ENCOUNTER — Other Ambulatory Visit: Payer: Self-pay | Admitting: Internal Medicine

## 2019-10-13 DIAGNOSIS — I420 Dilated cardiomyopathy: Secondary | ICD-10-CM

## 2019-10-13 NOTE — Telephone Encounter (Signed)
Requested Prescriptions  Pending Prescriptions Disp Refills   isosorbide mononitrate (IMDUR) 30 MG 24 hr tablet [Pharmacy Med Name: Isosorbide Mononitrate ER 30 MG Oral Tablet Extended Release 24 Hour] 90 tablet 3    Sig: Take 1 tablet by mouth once daily     Cardiovascular:  Nitrates Passed - 10/13/2019  6:05 AM      Passed - Last BP in normal range    BP Readings from Last 1 Encounters:  07/28/19 111/71         Passed - Last Heart Rate in normal range    Pulse Readings from Last 1 Encounters:  07/28/19 82         Passed - Valid encounter within last 12 months    Recent Outpatient Visits          2 months ago Uncontrolled type 2 diabetes mellitus with peripheral neuropathy (HCC)   [facility] Jonah Blue B, MD   4 months ago Chronic bilateral low back pain without sciatica   [facility] Jonah Blue B, MD   7 months ago Uncontrolled type 2 diabetes mellitus with peripheral neuropathy [facility])   [facility] Jonah Blue B, MD   10 months ago Uncontrolled type 2 diabetes mellitus with peripheral neuropathy [facility])   [facility] Marcine Matar, MD   1 year ago Uncontrolled type 2 diabetes mellitus with peripheral neuropathy [facility])   [facility] Marcine Matar, MD      Future Appointments            In 2 weeks Marcine Matar, MD [facility]   In 3 months Crenshaw, Madolyn Frieze, MD [facility] [place]

## 2019-10-14 DIAGNOSIS — F4310 Post-traumatic stress disorder, unspecified: Secondary | ICD-10-CM | POA: Diagnosis not present

## 2019-10-15 ENCOUNTER — Telehealth: Payer: Self-pay | Admitting: *Deleted

## 2019-10-15 MED ORDER — LORATADINE 10 MG PO TABS: 10.0000 mg | ORAL_TABLET | Freq: Every day | ORAL | 0 refills | Status: DC

## 2019-10-15 NOTE — Addendum Note (Signed)
Addended by: Jonah Blue B on: 10/15/2019 04:58 PM   Modules accepted: Orders

## 2019-10-15 NOTE — Telephone Encounter (Signed)
Patient called stating she is having either sinus or allergies her ears are stopped up and she is coughing up mucus. Patient states this is not covid related. Patient would like to talk to a nurse or Dr Laural Benes on what she should do or take.

## 2019-10-15 NOTE — Telephone Encounter (Signed)
Returned pt call and went over Dr. Laural Benes message pt doesn't have any questions or concerns

## 2019-10-15 NOTE — Telephone Encounter (Signed)
Will forward to pcp

## 2019-10-21 DIAGNOSIS — F4310 Post-traumatic stress disorder, unspecified: Secondary | ICD-10-CM | POA: Diagnosis not present

## 2019-10-23 DIAGNOSIS — F4310 Post-traumatic stress disorder, unspecified: Secondary | ICD-10-CM | POA: Diagnosis not present

## 2019-10-29 ENCOUNTER — Telehealth: Payer: Self-pay | Admitting: Internal Medicine

## 2019-10-29 ENCOUNTER — Ambulatory Visit: Payer: Medicaid Other | Attending: Internal Medicine | Admitting: Internal Medicine

## 2019-10-29 DIAGNOSIS — E1165 Type 2 diabetes mellitus with hyperglycemia: Secondary | ICD-10-CM | POA: Diagnosis not present

## 2019-10-29 DIAGNOSIS — Z1211 Encounter for screening for malignant neoplasm of colon: Secondary | ICD-10-CM | POA: Diagnosis not present

## 2019-10-29 DIAGNOSIS — I10 Essential (primary) hypertension: Secondary | ICD-10-CM | POA: Diagnosis not present

## 2019-10-29 DIAGNOSIS — Z1231 Encounter for screening mammogram for malignant neoplasm of breast: Secondary | ICD-10-CM | POA: Diagnosis not present

## 2019-10-29 DIAGNOSIS — N951 Menopausal and female climacteric states: Secondary | ICD-10-CM | POA: Diagnosis not present

## 2019-10-29 DIAGNOSIS — E1142 Type 2 diabetes mellitus with diabetic polyneuropathy: Secondary | ICD-10-CM | POA: Diagnosis not present

## 2019-10-29 DIAGNOSIS — L659 Nonscarring hair loss, unspecified: Secondary | ICD-10-CM

## 2019-10-29 DIAGNOSIS — IMO0002 Reserved for concepts with insufficient information to code with codable children: Secondary | ICD-10-CM

## 2019-10-29 DIAGNOSIS — F4310 Post-traumatic stress disorder, unspecified: Secondary | ICD-10-CM | POA: Diagnosis not present

## 2019-10-29 NOTE — Progress Notes (Signed)
Virtual Visit via Telephone Note Due to current restrictions/limitations of in-office visits due to the COVID-19 pandemic, this scheduled clinical appointment was converted to a telehealth visit  I connected with [name] on 10/29/19 at 12:10 p.m by telephone and verified that I am speaking with the correct person using two identifiers. I am in my office.  The patient is at home.    Only the patient and myself participated in this encounter. I discussed the limitations, risks, security and privacy concerns of performing an evaluation and management service by telephone and the availability of in person appointments. I also discussed with the patient that there may be a patient responsible charge related to this service. The patient expressed understanding and agreed to proceed.   History of Present Illness: DMwith neuropathy. DCM/sys and diastolic CHF with EH21-22% on 08/2019, HL, lung nodules resolved, HTN, hx of LT common carotid dissection, CKD 2-3, mild anemia, homeless, vit D def, anxiety/depression, mild intermittent asthma.  Last seen 07/2019.  Today's visit is chronic ds management   DIABETES TYPE 2 Last A1C:   Results for orders placed or performed in visit on 07/28/19  POCT glucose (manual entry)  Result Value Ref Range   POC Glucose 247 (A) 70 - 99 mg/dl  POCT glycosylated hemoglobin (Hb A1C)  Result Value Ref Range   Hemoglobin A1C     HbA1c POC (<> result, manual entry)     HbA1c, POC (prediabetic range)     HbA1c, POC (controlled diabetic range) 8.7 (A) 0.0 - 7.0 %    Med Adherence:  _0  Yes.  On last visit we had her stop the short acting insulin and placed her on Victoza instead.  She was to continue Lantus insulin.  She reports compliance with Lantus 35 and Victozia Medication side effects:  _1  Yes    _2  No Home Monitoring?  _3  Yes but not at all this wk Home glucose results range: less than 200 Diet Adherence: decrease appetite on Vicotzia Exercise: _4  Yes -  walk daily at a nearby park for 30-60 mins Hypoglycemic episodes?: _5  Yes    _6  No Numbness of the feet? _7  Yes    _8  No Retinopathy hx? _9  Yes    _10  No Last eye exam: She is up-to-date with eye exam. Comments:   HYPERTENSION/systolic and diastolic CHF Currently taking: see medication list.  She is supposed to be on furosemide, isosorbide, carvedilol, spironolactone, hydralazine.  She had echo done in July of this year.  EF had improved to 35 to 40%.  She is followed by cardiologist Dr. Stanford Breed. Med Adherence: _11  Yes    _12  No Medication side effects: _13  Yes    _14  No Adherence with salt restriction: _15  Yes    _16  No Home Monitoring?: _17  Yes    _18  No Monitoring Frequency: _19  Yes    _20  No Home BP results range: _21  Yes    _22  No SOB? _23  Yes    _24  No Chest Pain?: _25  Yes    _26  No Leg swelling?: _27  Yes    _28  No Headaches?: _29  Yes    _30  No Dizziness? _31  Yes    _32  No Comments:   Concern about hair thinning at front center of head.  Her sister and mom both have same thing.    Still gets menses every month lasting 3-4 days despite being 54 years old.  Wants to know when menses will come to an end.  HM:  Completed Cologuard test but I  have not received the results.  She is due for mammogram.  She thought she had one done recently.  However the last one that I see in the system is from 2019.  Outpatient Encounter Medications as of 10/29/2019  Medication Sig  . ACCU-CHEK FASTCLIX LANCETS MISC 1 kit by Does not apply route 3 (three) times daily. Use to check blood sugar up to 3 times daily.  Marland Kitchen albuterol (PROVENTIL) (2.5 MG/3ML) 0.083% nebulizer solution Take 3 mLs (2.5 mg total) by nebulization every 6 (six) hours as needed for wheezing or shortness of breath.  Marland Kitchen albuterol (VENTOLIN HFA) 108 (90 Base) MCG/ACT inhaler Inhale 2 puffs into the lungs every 6 (six) hours as needed for wheezing or shortness of breath.  Marland Kitchen aspirin 81 MG chewable tablet Chew 1 tablet (81 mg total) by mouth daily.   . blood glucose meter kit and supplies Dispense based on patient and insurance preference. Use up to four times daily as directed. (FOR ICD-9 250.00, 250.01).  . Blood Glucose Monitoring Suppl (ACCU-CHEK GUIDE ME) w/Device KIT 1 kit by Does not apply route 3 (three) times daily. Use to check blood sugar up to 3 times daily.  . carvedilol (COREG) 12.5 MG tablet Take 1 tablet (12.5 mg total) by mouth 2 (two) times daily with a meal.  . DULoxetine (CYMBALTA) 20 MG capsule Take 1 capsule (20 mg total) by mouth daily.  . furosemide (LASIX) 40 MG tablet TAKE 1 TABLET BY MOUTH ONCE DAILY FOR  FLUID/HEART  FAILURE  . glucose blood (ACCU-CHEK GUIDE) test strip Use as instructed to check blood sugar up to 3 times daily.  . hydrALAZINE (APRESOLINE) 50 MG tablet Take 1 tablet (50 mg total) by mouth 3 (three) times daily.  . insulin glargine (LANTUS SOLOSTAR) 100 UNIT/ML Solostar Pen INJECT 35 UNITS SUBCUTANEOUSLY AT BEDTIME  . insulin lispro (HUMALOG KWIKPEN) 100 UNIT/ML KwikPen Inject 0.05 mLs (5 Units total) into the skin 3 (three) times daily.  . isosorbide mononitrate (IMDUR) 30 MG 24 hr tablet Take 1 tablet by mouth once daily  . liraglutide (VICTOZA) 18 MG/3ML SOPN Start 0.4m SQ once a day for 7 days, then increase to 1.286monce a day  . loratadine (CLARITIN) 10 MG tablet Take 1 tablet (10 mg total) by mouth daily.  . metFORMIN (GLUCOPHAGE-XR) 500 MG 24 hr tablet Take 1 tablet (500 mg total) by mouth daily with breakfast.  . rosuvastatin (CRESTOR) 40 MG tablet Take 1 tablet (40 mg total) by mouth daily.  . Marland Kitchenpironolactone (ALDACTONE) 25 MG tablet Take 1 tablet (25 mg total) by mouth daily.  . TRUEPLUS PEN NEEDLES 31G X 6 MM MISC USE AS DIRECTED   No facility-administered encounter medications on file as of 10/29/2019.    [facility]ations/Objective: [facility] 10/29/2019 07/28/2019 06/11/2019  Decreased Interest 0 1 0  Down, Depressed, Hopeless 0 2 0  PHQ - 2 Score 0 3 0  Altered sleeping  - 2 -  Tired, decreased energy - 2 -  Change in appetite - 2 -  Feeling bad or failure about yourself  - 0 -  Trouble concentrating - 0 -  Moving slowly or fidgety/restless - 1 -  Suicidal thoughts - 0 -  PHQ-9 Score - 10 -  Difficult doing work/chores - - -     Assessment and Plan: 1. Uncontrolled type 2 diabetes mellitus with peripheral neuropathy (HCBatesI do not have a good sense of level of control of her diabetes since she has  not been checking blood sugars consistently.  Encouraged her to do so.  She will come to the lab to have A1c drawn this week.  Encourage her to continue healthy eating habits and regular exercise.  Continue Lantus and Victoza for now. - Hemoglobin A1c; Future  2. Essential hypertension Continue current medications and low-salt diet.  We will try to arrange for blood pressure to be checked by clinical pharmacist when she comes in to have A1c drawn.  3. 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  Will refer to dermatology - TSH; Future  4. Perimenopausal We will check her hormone levels to see if she is in the perimenopausal range as yet given that she is still having cycles at the age of 60. - Follicle stimulating hormone; Future - Luteinizing hormone; Future  5. Screening for colon cancer I will have my CMA call [facility] to get the results of her Cologuard test.  6. Encounter for screening mammogram for malignant neoplasm of breast - MM Digital Screening; Future   Follow Up Instructions: 3 mths   I discussed the assessment and treatment plan with the patient. The patient was provided an opportunity to ask questions and all were answered. The patient agreed with the plan and demonstrated an understanding of the instructions.   The patient was advised to call back or seek an in-person evaluation if the symptoms worsen or if the condition fails to improve as anticipated.  I  provided 17 minutes of non-face-to-face time during this encounter.   Karle Plumber, MD

## 2019-10-30 DIAGNOSIS — N951 Menopausal and female climacteric states: Secondary | ICD-10-CM | POA: Diagnosis not present

## 2019-10-30 DIAGNOSIS — E1165 Type 2 diabetes mellitus with hyperglycemia: Secondary | ICD-10-CM | POA: Diagnosis not present

## 2019-10-30 DIAGNOSIS — E1142 Type 2 diabetes mellitus with diabetic polyneuropathy: Secondary | ICD-10-CM | POA: Diagnosis not present

## 2019-10-30 DIAGNOSIS — L659 Nonscarring hair loss, unspecified: Secondary | ICD-10-CM | POA: Diagnosis not present

## 2019-10-30 NOTE — Telephone Encounter (Signed)
Will forward to Valerie  °

## 2019-10-30 NOTE — Addendum Note (Signed)
Addended byMemory Dance on: 10/30/2019 10:41 AM   Modules accepted: Orders

## 2019-10-30 NOTE — Telephone Encounter (Signed)
Contacted Radiographer, therapeutic, results were negative, copy of results to be faxed to the office.

## 2019-10-31 LAB — FOLLICLE STIMULATING HORMONE: FSH: 55.8 m[IU]/mL

## 2019-10-31 LAB — TSH: TSH: 1.49 u[IU]/mL (ref 0.450–4.500)

## 2019-10-31 LAB — HEMOGLOBIN A1C
Est. average glucose Bld gHb Est-mCnc: 232 mg/dL
Hgb A1c MFr Bld: 9.7 % — ABNORMAL HIGH (ref 4.8–5.6)

## 2019-10-31 LAB — LUTEINIZING HORMONE: LH: 68.6 m[IU]/mL

## 2019-11-01 NOTE — Progress Notes (Signed)
Let pt know that her A1C is 9.7 up from 8.7 three months ago. I recommend increasing Lantus from 34 to 38 units daily.  Please check BS twice a day before breakfast and dinner and bring in readings in 2 weeks to meet with clinical pharmacist.  Please schedule this appt for her.  Her thyroid hormone level is normal.  Her other hormone levels are in the perimenopausal range.

## 2019-11-03 ENCOUNTER — Telehealth: Payer: Self-pay

## 2019-11-03 NOTE — Telephone Encounter (Signed)
Contacted pt to go over lab results pt is aware. Pt is not going to be able to make appt with luke for original appt for sept with Franky Macho pt has been rescheduled for oct 4   Dr. Laural Benes pt is wanting to know if she needing to get pap

## 2019-11-04 DIAGNOSIS — F4310 Post-traumatic stress disorder, unspecified: Secondary | ICD-10-CM | POA: Diagnosis not present

## 2019-11-04 NOTE — Telephone Encounter (Signed)
Returned pt call and lvm making pt aware that she is not due for a pap and if she has any questions or concerns to give a call

## 2019-11-10 ENCOUNTER — Ambulatory Visit: Payer: Medicaid Other | Admitting: Pharmacist

## 2019-11-11 DIAGNOSIS — F4310 Post-traumatic stress disorder, unspecified: Secondary | ICD-10-CM | POA: Diagnosis not present

## 2019-11-16 ENCOUNTER — Ambulatory Visit: Payer: Medicaid Other | Attending: Internal Medicine | Admitting: Pharmacist

## 2019-11-16 ENCOUNTER — Encounter: Payer: Self-pay | Admitting: Pharmacist

## 2019-11-16 ENCOUNTER — Other Ambulatory Visit: Payer: Self-pay

## 2019-11-16 DIAGNOSIS — IMO0002 Reserved for concepts with insufficient information to code with codable children: Secondary | ICD-10-CM

## 2019-11-16 DIAGNOSIS — E1142 Type 2 diabetes mellitus with diabetic polyneuropathy: Secondary | ICD-10-CM

## 2019-11-16 DIAGNOSIS — E1165 Type 2 diabetes mellitus with hyperglycemia: Secondary | ICD-10-CM

## 2019-11-16 LAB — GLUCOSE, POCT (MANUAL RESULT ENTRY): POC Glucose: 351 mg/dl — AB (ref 70–99)

## 2019-11-16 MED ORDER — ACCU-CHEK GUIDE ME W/DEVICE KIT: 1.0000 | PACK | Freq: Three times a day (TID) | 0 refills | Status: DC

## 2019-11-16 MED ORDER — ACCU-CHEK FASTCLIX LANCETS MISC: 1.0000 | Freq: Three times a day (TID) | 11 refills | Status: DC

## 2019-11-16 MED ORDER — LANTUS SOLOSTAR 100 UNIT/ML ~~LOC~~ SOPN: PEN_INJECTOR | SUBCUTANEOUS | 5 refills | Status: DC

## 2019-11-16 MED ORDER — VICTOZA 18 MG/3ML ~~LOC~~ SOPN: PEN_INJECTOR | SUBCUTANEOUS | 2 refills | Status: DC

## 2019-11-16 MED ORDER — ACCU-CHEK GUIDE VI STRP: ORAL_STRIP | 11 refills | Status: DC

## 2019-11-16 NOTE — Progress Notes (Signed)
    S:     No chief complaint on file.  Patient arrives in good spirits.  Presents for diabetes evaluation, education, and management. Patient was referred and last seen by Primary Care Provider on 10/29/2019.     Family/Social History:  - FHx: DM, heart disease, heart failure   Insurance coverage/medication affordability: Dnbi  Medication adherence denied. She has not increased the glargine dose and is not taking the Victoza.    Current diabetes medications include: Lantus 38 units daily, metformin 500 mg XR daily with breakfast; Victoza (not taking) Current hypertension medications include: carvedilol 12.5 mg BID, furosemide 40 mg daily, hydralazine 50 mg TID, spironolactone 25 mg daily  Current hyperlipidemia medications include: rosuvastatin 40 mg daily   Patient denies hypoglycemic events.  Patient reported dietary habits:  - Reports adherence to low-carb, low-sodium, and low caffeine  - However, admits to eating to eating tater tots this AM   Patient-reported exercise habits: walks daily; unspecified time    Patient denies nocturia (nighttime urination).  Patient denies neuropathy (nerve pain). Patient denies visual changes. Patient denies self foot exams.     O:  POCT: 351  Lab Results  Component Value Date   HGBA1C 9.7 (H) 10/30/2019   Vitals:   11/16/19 1417  BP: 135/69    Lipid Panel     Component Value Date/Time   CHOL 251 (H) 06/22/2019 0946   TRIG 152 (H) 06/22/2019 0946   HDL 42 06/22/2019 0946   CHOLHDL 6.0 (H) 06/22/2019 0946   CHOLHDL 6.4 (H) 05/05/2015 1645   VLDL 37 (H) 05/05/2015 1645   LDLCALC 181 (H) 06/22/2019 0946    Home fasting blood sugars: not checking   2 hour post-meal/random blood sugars: not checking .  Clinical Atherosclerotic Cardiovascular Disease (ASCVD): No  The 10-year ASCVD risk score Denman George DC Jr., et al., 2013) is: 18.7%   Values used to calculate the score:     Age: 54 years     Sex: Female     Is Non-Hispanic  African American: Yes     Diabetic: Yes     Tobacco smoker: No     Systolic Blood Pressure: 135 mmHg     Is BP treated: Yes     HDL Cholesterol: 42 mg/dL     Total Cholesterol: 251 mg/dL    A/P: Diabetes longstanding currently uncontrolled. Patient is able to verbalize appropriate hypoglycemia management plan. Medication adherence not reported. Control is suboptimal due to this. -Continued Lantus and Victoza. Pt encouraged to take as prescribed.  -Switch Lantus to AM dosing.  -Extensively discussed pathophysiology of diabetes, recommended lifestyle interventions, dietary effects on blood sugar control -Counseled on s/sx of and management of hypoglycemia -Next A1C anticipated 01/2020.   ASCVD risk - primary prevention in patient with diabetes. Last LDL is not controlled. High intensity statin indicated.  -Continued rosuvastatin 40 mg.   HTN: BP today slightly above goal. BP goal <130/80 mmHg. Medication adherence reported.  - Continued current regimen   Written patient instructions provided.  Total time in face to face counseling 30 minutes.   Follow up Pharmacist Clinic Visit in 1 month.   Butch Penny, PharmD, CPP Clinical Pharmacist [facility] [phone]

## 2019-11-18 ENCOUNTER — Telehealth: Payer: Self-pay | Admitting: Internal Medicine

## 2019-11-20 NOTE — Telephone Encounter (Signed)
Left discreet message on secure VM (verified w/ pt's name) of negative results, also sent normal result letter to MyChart.

## 2019-11-27 ENCOUNTER — Other Ambulatory Visit: Payer: Self-pay

## 2019-11-27 ENCOUNTER — Ambulatory Visit
Admission: RE | Admit: 2019-11-27 | Discharge: 2019-11-27 | Disposition: A | Discharge status: Home / Self Care | Payer: Medicaid Other | Source: Ambulatory Visit | Attending: Internal Medicine | Admitting: Internal Medicine

## 2019-11-27 DIAGNOSIS — Z1231 Encounter for screening mammogram for malignant neoplasm of breast: Secondary | ICD-10-CM

## 2019-12-02 NOTE — Progress Notes (Signed)
Normal result letter generated and mailed to address on file. 

## 2019-12-11 DIAGNOSIS — F4310 Post-traumatic stress disorder, unspecified: Secondary | ICD-10-CM | POA: Diagnosis not present

## 2019-12-13 ENCOUNTER — Other Ambulatory Visit: Payer: Self-pay | Admitting: Cardiology

## 2019-12-13 DIAGNOSIS — I5022 Chronic systolic (congestive) heart failure: Secondary | ICD-10-CM

## 2019-12-13 DIAGNOSIS — I428 Other cardiomyopathies: Secondary | ICD-10-CM

## 2019-12-15 ENCOUNTER — Ambulatory Visit: Payer: Medicaid Other | Attending: Internal Medicine | Admitting: Pharmacist

## 2019-12-15 ENCOUNTER — Encounter: Payer: Self-pay | Admitting: Pharmacist

## 2019-12-15 ENCOUNTER — Other Ambulatory Visit: Payer: Self-pay

## 2019-12-15 DIAGNOSIS — E1165 Type 2 diabetes mellitus with hyperglycemia: Secondary | ICD-10-CM

## 2019-12-15 DIAGNOSIS — IMO0002 Reserved for concepts with insufficient information to code with codable children: Secondary | ICD-10-CM

## 2019-12-15 DIAGNOSIS — E1142 Type 2 diabetes mellitus with diabetic polyneuropathy: Secondary | ICD-10-CM

## 2019-12-15 LAB — GLUCOSE, POCT (MANUAL RESULT ENTRY): POC Glucose: 179 mg/dl — AB (ref 70–99)

## 2019-12-15 MED ORDER — ACCU-CHEK SOFTCLIX LANCETS MISC: 2 refills | Status: DC

## 2019-12-15 NOTE — Progress Notes (Signed)
     S:     No chief complaint on file.  Patient arrives in good spirits.  Presents for diabetes evaluation, education, and management. Patient was referred and last seen by Primary Care Provider on 10/29/2019.     She was last seen by clinical pharmacist on 11/16/2019. At this visit, she had not successfully started Victoza or increased glargine dose. Patient was educated at that visit.   Today, patient reports successful titration of Victoza and adherence to 28 units of Lantus. Denies any side effects or administration issues. Denies hypoglycemic events.   Family/Social History:  - FHx: DM, heart disease, heart failure   Insurance coverage/medication affordability: Dnbi  Medication adherence reported appropriate  Current diabetes medications include: Lantus 38 units daily, metformin 500 mg XR daily with breakfast; Victoza 1.8 mg once daily Current hypertension medications include: carvedilol 12.5 mg BID, furosemide 40 mg daily, hydralazine 50 mg TID, spironolactone 25 mg daily  Current hyperlipidemia medications include: rosuvastatin 40 mg daily   Patient denies hypoglycemic events.  Patient reported dietary habits:  - Reports adherence to low-carb, low-sodium, and low caffeine  - However, admits to eating to eating tater tots this AM   Patient-reported exercise habits: walks daily; unspecified time    Patient denies nocturia (nighttime urination).  Patient denies neuropathy (nerve pain). Patient denies visual changes. Patient denies self foot exams.     O:  POCT: 179  Lab Results  Component Value Date   HGBA1C 9.7 (H) 10/30/2019   There were no vitals filed for this visit.  Lipid Panel     Component Value Date/Time   CHOL 251 (H) 06/22/2019 0946   TRIG 152 (H) 06/22/2019 0946   HDL 42 06/22/2019 0946   CHOLHDL 6.0 (H) 06/22/2019 0946   CHOLHDL 6.4 (H) 05/05/2015 1645   VLDL 37 (H) 05/05/2015 1645   LDLCALC 181 (H) 06/22/2019 0946   Patient has not been  taking blood sugars at home. She was not sure how to use glucometer and seeking instruction today.   Clinical Atherosclerotic Cardiovascular Disease (ASCVD): No  The 10-year ASCVD risk score Denman George DC Jr., et al., 2013) is: 18.7%   Values used to calculate the score:     Age: 54 years     Sex: Female     Is Non-Hispanic African American: Yes     Diabetic: Yes     Tobacco smoker: No     Systolic Blood Pressure: 135 mmHg     Is BP treated: Yes     HDL Cholesterol: 42 mg/dL     Total Cholesterol: 251 mg/dL    A/P: Diabetes longstanding currently uncontrolled (A1c 9.7). Today, diabetes control appears to be improving. Patient is able to verbalize appropriate hypoglycemia management plan. Medication adherence reported.  -Continued Lantus and Victoza. Pt encouraged to take as prescribed.  -Extensively discussed pathophysiology of diabetes, recommended lifestyle interventions, dietary effects on blood sugar control -Counseled on s/sx of and management of hypoglycemia -Next A1C anticipated 01/2020.   ASCVD risk - primary prevention in patient with diabetes. Last LDL is not controlled. High intensity statin indicated.  -Continued rosuvastatin 40 mg.   Written patient instructions provided.  Total time in face to face counseling 30 minutes.   Follow up Pharmacist Clinic Visit in 1 month.   Butch Penny, PharmD, CPP Clinical Pharmacist [facility] [phone]

## 2019-12-17 DIAGNOSIS — F4310 Post-traumatic stress disorder, unspecified: Secondary | ICD-10-CM | POA: Diagnosis not present

## 2019-12-24 DIAGNOSIS — F4310 Post-traumatic stress disorder, unspecified: Secondary | ICD-10-CM | POA: Diagnosis not present

## 2020-01-13 NOTE — Progress Notes (Signed)
HPI: FUCM/CHF. Echocardiogram in April of 2013 showed an ejection fraction of 35%, moderate left ventricular enlargement, mild to moderate mitral regurgitation. HIV negative and TSH normal. Cardiac catheterization in April of 2013 showed normal coronary arteries and an ejection fraction of 35-40%. Patient was treated with medications and her cardiomyopathy was felt possibly secondary to hypertension. Echocardiogram repeated in September of 2013. Her ejection fraction was 30-35%. Patient referred to Dr. Rayann Heman for consideration of ICD but the patient decided not to pursue. Also with h/o carotid dissection healed on CTA 4/18.Echocardiogram repeated July 2021 and showed ejection fraction 35 to 40%, moderate left ventricular enlargement, moderate left atrial enlargement, mild to moderate mitral regurgitation.  Sincelast seen,  she denies dyspnea, chest pain, palpitations or syncope.  Current Outpatient Medications  Medication Sig Dispense Refill   Accu-Chek Softclix Lancets lancets Use to check blood sugar up to 3 times daily. E11.42 100 each 2   albuterol (PROVENTIL) (2.5 MG/3ML) 0.083% nebulizer solution Take 3 mLs (2.5 mg total) by nebulization every 6 (six) hours as needed for wheezing or shortness of breath. 75 mL 12   albuterol (VENTOLIN HFA) 108 (90 Base) MCG/ACT inhaler Inhale 2 puffs into the lungs every 6 (six) hours as needed for wheezing or shortness of breath. 18 g 6   aspirin 81 MG chewable tablet Chew 1 tablet (81 mg total) by mouth daily. 30 tablet 3   blood glucose meter kit and supplies Dispense based on patient and insurance preference. Use up to four times daily as directed. (FOR ICD-9 250.00, 250.01). 1 each 0   Blood Glucose Monitoring Suppl (ACCU-CHEK GUIDE ME) w/Device KIT 1 kit by Does not apply route 3 (three) times daily. Use to check blood sugar up to 3 times daily. 1 kit 0   carvedilol (COREG) 12.5 MG tablet Take 1 tablet (12.5 mg total) by mouth 2 (two) times  daily with a meal. 180 tablet 3   DULoxetine (CYMBALTA) 20 MG capsule Take 1 capsule (20 mg total) by mouth daily. 30 capsule 5   furosemide (LASIX) 40 MG tablet TAKE 1 TABLET BY MOUTH ONCE DAILY FOR  FLUID/HEART  FAILURE 90 tablet 0   glucose blood (ACCU-CHEK GUIDE) test strip Use as instructed to check blood sugar up to 3 times daily. 100 each 11   hydrALAZINE (APRESOLINE) 50 MG tablet Take 1 tablet (50 mg total) by mouth 3 (three) times daily. 270 tablet 3   insulin glargine (LANTUS SOLOSTAR) 100 UNIT/ML Solostar Pen INJECT 38 UNITS SUBCUTANEOUSLY AT BEDTIME 15 mL 5   isosorbide mononitrate (IMDUR) 30 MG 24 hr tablet Take 1 tablet by mouth once daily 90 tablet 3   liraglutide (VICTOZA) 18 MG/3ML SOPN Start 0.94m SQ once a day for 7 days, then increase to 1.241monce a day x7 days, then 1.8 mg daily thereafter. (Patient taking differently: 1.8 mg.) 6 mL 2   loratadine (CLARITIN) 10 MG tablet Take 1 tablet (10 mg total) by mouth daily. 30 tablet 0   metFORMIN (GLUCOPHAGE-XR) 500 MG 24 hr tablet Take 1 tablet (500 mg total) by mouth daily with breakfast. 90 tablet 1   spironolactone (ALDACTONE) 25 MG tablet Take 1 tablet (25 mg total) by mouth daily. 90 tablet 2   TRUEPLUS PEN NEEDLES 31G X 6 MM MISC USE AS DIRECTED 100 each 1   rosuvastatin (CRESTOR) 40 MG tablet Take 1 tablet (40 mg total) by mouth daily. 90 tablet 3   No current facility-administered medications for  this visit.     Past Medical History:  Diagnosis Date   Asthma 05/31/2017   Diabetes mellitus, type 2 ([place])    A1C 7.6 April '13   Hypertension    Left leg pain 07/21/2015   Nonischemic cardiomyopathy ([employer])    Echo 05/19/11 EF 35% w/ mild-mod MR; R/L Heart Cath 05/21/11 - mildly elevated right heart pressures, normal left heart pressures, preserved cardiac output, widely patent coronary arteries without significant CAD and moderate global systolic LV dysfunction, EF 35-40%.   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    right sided weakness, now resolved   Systolic CHF ([place])    Echo 05/19/11 EF 35% w/ mild-mod MR    Past Surgical History:  Procedure Laterality Date   CERVIX LESION DESTRUCTION     DILATION AND CURETTAGE OF UTERUS     LEFT HEART CATHETERIZATION WITH CORONARY ANGIOGRAM N/A 05/21/2011   Procedure: LEFT HEART CATHETERIZATION WITH CORONARY ANGIOGRAM;  Surgeon: Sherren Mocha, MD;  Location: [facility] CATH LAB;  Service: Cardiovascular;  Laterality: N/A;   TUBAL LIGATION  1996    Social History   Socioeconomic History   Marital status: Divorced    Spouse name: Not on file   Number of children: 2   Years of education: Not on file   Highest education level: Not on file  Occupational History   Not on file  Tobacco Use   Smoking status: Never Smoker   Smokeless tobacco: Never Used  Vaping Use   Vaping Use: Never used  Substance and Sexual Activity   Alcohol use: No   Drug use: No   Sexual activity: Yes    Birth control/protection: None  Other Topics Concern   Not on file  Social History Narrative   Lives in [place].  Presently unemployed but attends school full time   Social Determinants of Radio broadcast assistant Strain: Not on file  Food Insecurity: Not on file  Transportation Needs: Not on file  Physical Activity: Not on file  Stress: Not on file  Social Connections: Not on file  Intimate Partner Violence: Not on file    Family History  Problem Relation Age of Onset   Diabetes Other        multiple   Heart failure Paternal Grandmother    Breast cancer Paternal Grandmother    Heart disease Other        multiple   Breast cancer Mother     ROS: no fevers or chills, productive cough, hemoptysis, dysphasia, odynophagia, melena, hematochezia, dysuria, hematuria, rash, seizure activity, orthopnea, PND, pedal edema, claudication. Remaining systems are negative.  Physical Exam: Well-developed well-nourished in no acute distress.  Skin is warm and  dry.  HEENT is normal.  Neck is supple.  Chest is clear to auscultation with normal expansion.  Cardiovascular exam is regular rate and rhythm.  Abdominal exam nontender or distended. No masses palpated. Extremities show no edema. neuro grossly intact  A/P  1 nonischemic cardiomyopathy-continue hydralazine/nitrates and beta-blocker.  She has a history of angioedema with ACE inhibitors/ARB.  She has declined ICD previously.  Most recent echocardiogram shows mild improvement in LV function.  2 Chronic combined systolic/diastolic congestive heart failure-she is euvolemic.  Continue Lasix and spironolactone.  Check potassium and renal function.  3 hypertension-blood pressure controlled.  Continue present medications and follow.  4 mitral regurgitation-patient will need follow-up echoes in the future.  5 prior left common carotid artery dissection-healed on most recent CT scan.  6 history of noncompliance  7 hyperlipidemia-continue Crestor.  Check lipids and liver.  Kirk Ruths, MD

## 2020-01-25 ENCOUNTER — Ambulatory Visit (INDEPENDENT_AMBULATORY_CARE_PROVIDER_SITE_OTHER): Payer: 59 | Admitting: Cardiology

## 2020-01-25 ENCOUNTER — Other Ambulatory Visit: Payer: Self-pay

## 2020-01-25 ENCOUNTER — Encounter: Payer: Self-pay | Admitting: Cardiology

## 2020-01-25 VITALS — BP 106/62 | HR 90 | Ht 69.0 in | Wt 204.0 lb

## 2020-01-25 DIAGNOSIS — E1169 Type 2 diabetes mellitus with other specified complication: Secondary | ICD-10-CM

## 2020-01-25 DIAGNOSIS — I5022 Chronic systolic (congestive) heart failure: Secondary | ICD-10-CM | POA: Diagnosis not present

## 2020-01-25 DIAGNOSIS — E785 Hyperlipidemia, unspecified: Secondary | ICD-10-CM

## 2020-01-25 DIAGNOSIS — I5021 Acute systolic (congestive) heart failure: Secondary | ICD-10-CM

## 2020-01-25 DIAGNOSIS — I428 Other cardiomyopathies: Secondary | ICD-10-CM

## 2020-01-25 DIAGNOSIS — I10 Essential (primary) hypertension: Secondary | ICD-10-CM

## 2020-01-25 DIAGNOSIS — I340 Nonrheumatic mitral (valve) insufficiency: Secondary | ICD-10-CM

## 2020-01-25 NOTE — Patient Instructions (Signed)
°  Lab Work:  Your physician recommends that you return for lab work FASTING  If you have labs (blood work) drawn today and your tests are completely normal, you will receive your results only by:  MyChart Message (if you have MyChart) OR  A paper copy in the mail If you have any lab test that is abnormal or we need to change your treatment, we will call you to review the results.   Follow-Up: At [facility], you and your health needs are our priority.  As part of our continuing mission to provide you with exceptional heart care, we have created designated Provider Care Teams.  These Care Teams include your primary Cardiologist (physician) and Advanced Practice Providers (APPs -  Physician Assistants and Nurse Practitioners) who all work together to provide you with the care you need, when you need it.  We recommend signing up for the patient portal called "MyChart".  Sign up information is provided on this After Visit Summary.  MyChart is used to connect with patients for Virtual Visits (Telemedicine).  Patients are able to view lab/test results, encounter notes, upcoming appointments, etc.  Non-urgent messages can be sent to your provider as well.   To learn more about what you can do with MyChart, go to [email or web address].    Your next appointment:   6 month(s)  The format for your next appointment:   In Person  Provider:   Olga Millers, MD

## 2020-01-28 ENCOUNTER — Ambulatory Visit: Payer: 59 | Attending: Internal Medicine | Admitting: Internal Medicine

## 2020-01-28 ENCOUNTER — Encounter: Payer: Self-pay | Admitting: Internal Medicine

## 2020-01-28 ENCOUNTER — Other Ambulatory Visit: Payer: Self-pay

## 2020-01-28 VITALS — BP 111/72 | HR 95 | Temp 98.0°F | Resp 16 | Wt 202.2 lb

## 2020-01-28 DIAGNOSIS — I420 Dilated cardiomyopathy: Secondary | ICD-10-CM | POA: Insufficient documentation

## 2020-01-28 DIAGNOSIS — E1122 Type 2 diabetes mellitus with diabetic chronic kidney disease: Secondary | ICD-10-CM | POA: Insufficient documentation

## 2020-01-28 DIAGNOSIS — E1165 Type 2 diabetes mellitus with hyperglycemia: Secondary | ICD-10-CM | POA: Insufficient documentation

## 2020-01-28 DIAGNOSIS — Z79899 Other long term (current) drug therapy: Secondary | ICD-10-CM | POA: Diagnosis not present

## 2020-01-28 DIAGNOSIS — Z794 Long term (current) use of insulin: Secondary | ICD-10-CM | POA: Insufficient documentation

## 2020-01-28 DIAGNOSIS — E785 Hyperlipidemia, unspecified: Secondary | ICD-10-CM | POA: Diagnosis not present

## 2020-01-28 DIAGNOSIS — I5043 Acute on chronic combined systolic (congestive) and diastolic (congestive) heart failure: Secondary | ICD-10-CM | POA: Diagnosis not present

## 2020-01-28 DIAGNOSIS — Z5900 Homelessness unspecified: Secondary | ICD-10-CM | POA: Diagnosis not present

## 2020-01-28 DIAGNOSIS — L659 Nonscarring hair loss, unspecified: Secondary | ICD-10-CM | POA: Diagnosis not present

## 2020-01-28 DIAGNOSIS — I10 Essential (primary) hypertension: Secondary | ICD-10-CM | POA: Diagnosis not present

## 2020-01-28 DIAGNOSIS — N183 Chronic kidney disease, stage 3 unspecified: Secondary | ICD-10-CM | POA: Insufficient documentation

## 2020-01-28 DIAGNOSIS — I130 Hypertensive heart and chronic kidney disease with heart failure and stage 1 through stage 4 chronic kidney disease, or unspecified chronic kidney disease: Secondary | ICD-10-CM | POA: Diagnosis not present

## 2020-01-28 DIAGNOSIS — Z7982 Long term (current) use of aspirin: Secondary | ICD-10-CM | POA: Diagnosis not present

## 2020-01-28 DIAGNOSIS — E1142 Type 2 diabetes mellitus with diabetic polyneuropathy: Secondary | ICD-10-CM | POA: Insufficient documentation

## 2020-01-28 DIAGNOSIS — I5042 Chronic combined systolic (congestive) and diastolic (congestive) heart failure: Secondary | ICD-10-CM

## 2020-01-28 DIAGNOSIS — IMO0002 Reserved for concepts with insufficient information to code with codable children: Secondary | ICD-10-CM

## 2020-01-28 DIAGNOSIS — E1169 Type 2 diabetes mellitus with other specified complication: Secondary | ICD-10-CM

## 2020-01-28 LAB — POCT GLYCOSYLATED HEMOGLOBIN (HGB A1C): HbA1c, POC (controlled diabetic range): 7.4 % — AB (ref 0.0–7.0)

## 2020-01-28 LAB — GLUCOSE, POCT (MANUAL RESULT ENTRY): POC Glucose: 171 mg/dl — AB (ref 70–99)

## 2020-01-28 MED ORDER — ROSUVASTATIN CALCIUM 40 MG PO TABS: 40.0000 mg | ORAL_TABLET | Freq: Every day | ORAL | 3 refills | Status: DC

## 2020-01-28 NOTE — Progress Notes (Signed)
Patient ID: Esly Selvage, female    DOB: November 06, 1965  MRN: [id number]  CC: Diabetes and Hypertension   Subjective: [name] is a 54 y.o. female who presents for chronic ds management Her concerns today include:  DMwith neuropathy. DCM/sys and diastolicCHF with RK27-06% on 08/2019, HL, lung nodulesresolved, HTN, hx ofLT commoncarotid dissection, CKD 2-3, mild anemia, homeless, vit D def, anxiety/depression, mild intermittent asthma.    DIABETES TYPE 2 Last A1C:   Results for orders placed or performed in visit on 01/28/20  POCT glucose (manual entry)  Result Value Ref Range   POC Glucose 171 (A) 70 - 99 mg/dl  POCT glycosylated hemoglobin (Hb A1C)  Result Value Ref Range   Hemoglobin A1C     HbA1c POC (<> result, manual entry)     HbA1c, POC (prediabetic range)     HbA1c, POC (controlled diabetic range) 7.4 (A) 0.0 - 7.0 %   A1C down 2 points since last visit. Med Adherence:  _0  Yes  Lantus 38 units daily, Vicotza and Metformin 500 mg daily Medication side effects:  _1  Yes    _2  No Home Monitoring?  _3  Yes but only once a wk Home glucose results range:reports readings have been good Diet Adherence: Not eating as much and nothing after 7 p.m Exercise: does a lot of walking at work and goes to park 3x/wk Hypoglycemic episodes?: _4  Yes    _5  No Numbness of the feet? _6  Yes    _7  No Retinopathy hx? _8  Yes    _9  No Last eye exam: no blurred vision Comments:   HYPERTENSION/CHF Currently taking: see medication list Med Adherence: _10  Yes    _11  No Medication side effects: _12  Yes    _13  No Adherence with salt restriction: _14  Yes    _15  No Home Monitoring?: _16  Yes    _17  No Monitoring Frequency: _18  Yes    _19  No Home BP results range: _20  Yes    _21  No SOB? _22  Yes    _23  No Chest Pain?: _24  Yes    _25  No Leg swelling?: _26  Yes    _27  No Headaches?: _28  Yes    _29  No Dizziness? _30  Yes    _31  No Comments:   HL: She has been out of Crestor she thinks for several  weeks.  Needs refill sent to her pharmacy.    Still living in a motel.  She is working on trying to get low cost housing.  On last visit she complained about hair loss around the frontal edges.  She states that her mom and sister had the same issues as they got older.  She has had this for about 2 years and she feels it is getting worse.  TSH was normal.  She would like referral to dermatology.  HM: plans to get COVID booster.  Completed Cologuard which was negative Patient Active Problem List   Diagnosis Date Noted  . Sinus tachycardia 10/15/2018  . Vitamin D deficiency 03/26/2018  . Homeless 03/24/2018  . Microalbuminuria 08/11/2017  . Anemia 08/08/2017  . Physical deconditioning   . Hyperlipidemia 02/21/2017  . Diabetic polyneuropathy associated with type 2 diabetes mellitus ([place]) 02/21/2017  . Lung nodules 07/16/2016  . Uncontrolled type 2 diabetes mellitus without complication, with long-term current use of insulin 07/16/2016  . DCM (dilated cardiomyopathy) ([place])   . Acute on chronic combined systolic and diastolic CHF (congestive heart failure) ([place]) 06/10/2016  . Essential hypertension 06/10/2016  . Dyslipidemia associated with type 2  diabetes mellitus ([place]) 06/10/2016     Current Outpatient Medications on File Prior to Visit  Medication Sig Dispense Refill  . Accu-Chek Softclix Lancets lancets Use to check blood sugar up to 3 times daily. E11.42 100 each 2  . albuterol (PROVENTIL) (2.5 MG/3ML) 0.083% nebulizer solution Take 3 mLs (2.5 mg total) by nebulization every 6 (six) hours as needed for wheezing or shortness of breath. 75 mL 12  . albuterol (VENTOLIN HFA) 108 (90 Base) MCG/ACT inhaler Inhale 2 puffs into the lungs every 6 (six) hours as needed for wheezing or shortness of breath. 18 g 6  . aspirin 81 MG chewable tablet Chew 1 tablet (81 mg total) by mouth daily. 30 tablet 3  . blood glucose meter kit and supplies Dispense based on patient and insurance preference. Use up  to four times daily as directed. (FOR ICD-9 250.00, 250.01). 1 each 0  . Blood Glucose Monitoring Suppl (ACCU-CHEK GUIDE ME) w/Device KIT 1 kit by Does not apply route 3 (three) times daily. Use to check blood sugar up to 3 times daily. 1 kit 0  . carvedilol (COREG) 12.5 MG tablet Take 1 tablet (12.5 mg total) by mouth 2 (two) times daily with a meal. 180 tablet 3  . DULoxetine (CYMBALTA) 20 MG capsule Take 1 capsule (20 mg total) by mouth daily. 30 capsule 5  . furosemide (LASIX) 40 MG tablet TAKE 1 TABLET BY MOUTH ONCE DAILY FOR  FLUID/HEART  FAILURE 90 tablet 0  . glucose blood (ACCU-CHEK GUIDE) test strip Use as instructed to check blood sugar up to 3 times daily. 100 each 11  . hydrALAZINE (APRESOLINE) 50 MG tablet Take 1 tablet (50 mg total) by mouth 3 (three) times daily. 270 tablet 3  . insulin glargine (LANTUS SOLOSTAR) 100 UNIT/ML Solostar Pen INJECT 38 UNITS SUBCUTANEOUSLY AT BEDTIME 15 mL 5  . isosorbide mononitrate (IMDUR) 30 MG 24 hr tablet Take 1 tablet by mouth once daily 90 tablet 3  . liraglutide (VICTOZA) 18 MG/3ML SOPN Start 0.65m SQ once a day for 7 days, then increase to 1.2109monce a day x7 days, then 1.8 mg daily thereafter. (Patient taking differently: 1.8 mg.) 6 mL 2  . loratadine (CLARITIN) 10 MG tablet Take 1 tablet (10 mg total) by mouth daily. 30 tablet 0  . metFORMIN (GLUCOPHAGE-XR) 500 MG 24 hr tablet Take 1 tablet (500 mg total) by mouth daily with breakfast. 90 tablet 1  . rosuvastatin (CRESTOR) 40 MG tablet Take 1 tablet (40 mg total) by mouth daily. 90 tablet 3  . spironolactone (ALDACTONE) 25 MG tablet Take 1 tablet (25 mg total) by mouth daily. 90 tablet 2  . TRUEPLUS PEN NEEDLES 31G X 6 MM MISC USE AS DIRECTED 100 each 1   No current facility-administered medications on file prior to visit.    Allergies  Allergen Reactions  . Lisinopril Hives and Swelling    Facial   . Sertraline Hives and Swelling    Facial   . Tramadol Hives and Swelling    facial  .  Ibuprofen Hives and Swelling    Social History   Socioeconomic History  . Marital status: Divorced    Spouse name: Not on file  . Number of children: 2  . Years of education: Not on file  . Highest education level: Not on file  Occupational History  . Not on file  Tobacco Use  . Smoking status: Never Smoker  . Smokeless tobacco: Never Used  Vaping Use  .  Vaping Use: Never used  Substance and Sexual Activity  . Alcohol use: No  . Drug use: No  . Sexual activity: Yes    Birth control/protection: None  Other Topics Concern  . Not on file  Social History Narrative   Lives in [place].  Presently unemployed but attends school full time   Social Determinants of Radio broadcast assistant Strain: Not on file  Food Insecurity: Not on file  Transportation Needs: Not on file  Physical Activity: Not on file  Stress: Not on file  Social Connections: Not on file  Intimate Partner Violence: Not on file    Family History  Problem Relation Age of Onset  . Diabetes Other        multiple  . Heart failure Paternal Grandmother   . Breast cancer Paternal Grandmother   . Heart disease Other        multiple  . Breast cancer Mother     Past Surgical History:  Procedure Laterality Date  . CERVIX LESION DESTRUCTION    . DILATION AND CURETTAGE OF UTERUS    . LEFT HEART CATHETERIZATION WITH CORONARY ANGIOGRAM N/A 05/21/2011   Procedure: LEFT HEART CATHETERIZATION WITH CORONARY ANGIOGRAM;  Surgeon: Sherren Mocha, MD;  Location: [facility] CATH LAB;  Service: Cardiovascular;  Laterality: N/A;  . TUBAL LIGATION  1996    ROS: Review of Systems Negative except as stated above  PHYSICAL EXAM: BP 111/72   Pulse 95   Temp 98 F (36.7 C)   Resp 16   Wt 202 lb 3.2 oz (91.7 kg)   SpO2 100%   BMI 29.86 kg/m   Wt Readings from Last 3 Encounters:  01/28/20 202 lb 3.2 oz (91.7 kg)  01/25/20 204 lb (92.5 kg)  07/28/19 218 lb 9.6 oz (99.2 kg)    Physical Exam  General appearance -  alert, well appearing, and in no distress Mental status - normal mood, behavior, speech, dress, motor activity, and thought processes Neck - supple, no significant adenopathy Chest - clear to auscultation, no wheezes, rales or rhonchi, symmetric air entry Heart - normal rate, regular rhythm, normal S1, S2, no murmurs, rubs, clicks or gallops Extremities - peripheral pulses normal, no pedal edema, no clubbing or cyanosis  Depression screen [facility] 2/9 01/28/2020 10/29/2019 07/28/2019  Decreased Interest 0 0 1  Down, Depressed, Hopeless 0 0 2  PHQ - 2 Score 0 0 3  Altered sleeping - - 2  Tired, decreased energy - - 2  Change in appetite - - 2  Feeling bad or failure about yourself  - - 0  Trouble concentrating - - 0  Moving slowly or fidgety/restless - - 1  Suicidal thoughts - - 0  PHQ-9 Score - - 10  Difficult doing work/chores - - -     CMP Latest Ref Rng & Units 06/22/2019 10/22/2018 10/15/2018  Glucose 65 - 99 mg/dL 230(H) 441(H) 439(H)  BUN 6 - 24 mg/dL _0 Creatinine 0.57 - 1.00 mg/dL 1.07(H) 1.02(H) 0.98  Sodium 134 - 144 mmol/L 136 133(L) 132(L)  Potassium 3.5 - 5.2 mmol/L 4.5 4.5 4.7  Chloride 96 - 106 mmol/L 101 94(L) 94(L)  CO2 20 - 29 mmol/L _1 Calcium 8.7 - 10.2 mg/dL 9.3 9.7 9.8  Total Protein 6.0 - 8.5 g/dL 6.5 6.8 -  Total Bilirubin 0.0 - 1.2 mg/dL 0.7 0.8 -  Alkaline Phos 39 - 117 IU/L 80 118(H) -  AST 0 - 40 IU/L 8  11 -  ALT 0 - 32 IU/L 6 14 -   Lipid Panel     Component Value Date/Time   CHOL 251 (H) 06/22/2019 0946   TRIG 152 (H) 06/22/2019 0946   HDL 42 06/22/2019 0946   CHOLHDL 6.0 (H) 06/22/2019 0946   CHOLHDL 6.4 (H) 05/05/2015 1645   VLDL 37 (H) 05/05/2015 1645   LDLCALC 181 (H) 06/22/2019 0946    CBC    Component Value Date/Time   WBC 7.4 10/22/2018 0923   WBC 8.9 07/25/2017 0837   RBC 5.06 10/22/2018 0923   RBC 3.71 (L) 07/25/2017 0837   HGB 14.5 10/22/2018 0923   HCT 44.6 10/22/2018 0923   PLT 337 10/22/2018 0923   MCV 88  10/22/2018 0923   MCH 28.7 10/22/2018 0923   MCH 26.4 07/25/2017 0837   MCHC 32.5 10/22/2018 0923   MCHC 30.6 07/25/2017 0837   RDW 13.7 10/22/2018 0923   LYMPHSABS 2.5 07/31/2017 1225   MONOABS 0.6 06/02/2017 0530   EOSABS 0.2 07/31/2017 1225   BASOSABS 0.1 07/31/2017 1225    ASSESSMENT AND PLAN: 1. Uncontrolled type 2 diabetes mellitus with peripheral neuropathy ([place]) Commended her on getting her A1c down.  She has come a long way.  She seems to be doing well on the Victoza and has lost about 16 pounds since we started the medication.  Encourage her to continue healthy eating habits and regular exercise. - POCT glucose (manual entry) - POCT glycosylated hemoglobin (Hb A1C)  2. Dyslipidemia associated with type 2 diabetes mellitus (HCC) - rosuvastatin (CRESTOR) 40 MG tablet; Take 1 tablet (40 mg total) by mouth daily.  Dispense: 90 tablet; Refill: 3  3. Essential hypertension At goal.  Continue isosorbide, carvedilol, spironolactone, hydralazine  4. Hair loss Will submit referral to derm  5. Homeless She is working on trying to secure permanent housing  6. Chronic combined systolic and diastolic CHF, NYHA class 1 (HCC) Clinically stable and compensated.  She will continue furosemide, carvedilol, isosorbide and spironolactone    Patient was given the opportunity to ask questions.  Patient verbalized understanding of the plan and was able to repeat key elements of the plan.   Orders Placed This Encounter  Procedures  . POCT glucose (manual entry)  . POCT glycosylated hemoglobin (Hb A1C)     Requested Prescriptions    No prescriptions requested or ordered in this encounter    No follow-ups on file.  Karle Plumber, MD, FACP

## 2020-02-10 LAB — LIPID PANEL
Chol/HDL Ratio: 6.1 ratio — ABNORMAL HIGH (ref 0.0–4.4)
Cholesterol, Total: 237 mg/dL — ABNORMAL HIGH (ref 100–199)
HDL: 39 mg/dL — ABNORMAL LOW (ref >39)
LDL Chol Calc (NIH): 178 mg/dL — ABNORMAL HIGH (ref 0–99)
Triglycerides: 113 mg/dL (ref 0–149)
VLDL Cholesterol Cal: 20 mg/dL (ref 5–40)

## 2020-02-10 LAB — COMPREHENSIVE METABOLIC PANEL
ALT: 8 IU/L (ref 0–32)
AST: 9 IU/L (ref 0–40)
Albumin/Globulin Ratio: 1.6 (ref 1.2–2.2)
Albumin: 4.2 g/dL (ref 3.8–4.9)
Alkaline Phosphatase: 82 IU/L (ref 44–121)
BUN/Creatinine Ratio: 16 (ref 9–23)
BUN: 17 mg/dL (ref 6–24)
Bilirubin Total: 0.5 mg/dL (ref 0.0–1.2)
CO2: 25 mmol/L (ref 20–29)
Calcium: 9.8 mg/dL (ref 8.7–10.2)
Chloride: 101 mmol/L (ref 96–106)
Creatinine, Ser: 1.07 mg/dL — ABNORMAL HIGH (ref 0.57–1.00)
GFR calc Af Amer: 69 mL/min/{1.73_m2} (ref >59)
GFR calc non Af Amer: 60 mL/min/{1.73_m2} (ref >59)
Globulin, Total: 2.6 g/dL (ref 1.5–4.5)
Glucose: 144 mg/dL — ABNORMAL HIGH (ref 65–99)
Potassium: 4.2 mmol/L (ref 3.5–5.2)
Sodium: 139 mmol/L (ref 134–144)
Total Protein: 6.8 g/dL (ref 6.0–8.5)

## 2020-02-14 ENCOUNTER — Other Ambulatory Visit: Payer: Self-pay | Admitting: Internal Medicine

## 2020-02-14 DIAGNOSIS — E1142 Type 2 diabetes mellitus with diabetic polyneuropathy: Secondary | ICD-10-CM

## 2020-02-14 DIAGNOSIS — IMO0002 Reserved for concepts with insufficient information to code with codable children: Secondary | ICD-10-CM

## 2020-02-23 ENCOUNTER — Other Ambulatory Visit: Payer: Self-pay | Admitting: Internal Medicine

## 2020-02-23 DIAGNOSIS — E1142 Type 2 diabetes mellitus with diabetic polyneuropathy: Secondary | ICD-10-CM

## 2020-02-23 DIAGNOSIS — IMO0002 Reserved for concepts with insufficient information to code with codable children: Secondary | ICD-10-CM

## 2020-02-23 NOTE — Telephone Encounter (Signed)
Pt aware Rx at the pharmacy

## 2020-03-23 DIAGNOSIS — F4310 Post-traumatic stress disorder, unspecified: Secondary | ICD-10-CM | POA: Diagnosis not present

## 2020-03-27 ENCOUNTER — Other Ambulatory Visit: Payer: Self-pay | Admitting: Cardiology

## 2020-03-27 DIAGNOSIS — I428 Other cardiomyopathies: Secondary | ICD-10-CM

## 2020-03-27 DIAGNOSIS — I5022 Chronic systolic (congestive) heart failure: Secondary | ICD-10-CM

## 2020-04-05 DIAGNOSIS — F4310 Post-traumatic stress disorder, unspecified: Secondary | ICD-10-CM | POA: Diagnosis not present

## 2020-04-12 ENCOUNTER — Other Ambulatory Visit: Payer: Self-pay | Admitting: Internal Medicine

## 2020-04-12 DIAGNOSIS — E1142 Type 2 diabetes mellitus with diabetic polyneuropathy: Secondary | ICD-10-CM

## 2020-04-12 DIAGNOSIS — IMO0002 Reserved for concepts with insufficient information to code with codable children: Secondary | ICD-10-CM

## 2020-04-22 DIAGNOSIS — L659 Nonscarring hair loss, unspecified: Secondary | ICD-10-CM | POA: Diagnosis not present

## 2020-05-10 ENCOUNTER — Encounter: Payer: Self-pay | Admitting: *Deleted

## 2020-05-19 ENCOUNTER — Other Ambulatory Visit: Payer: Self-pay | Admitting: Internal Medicine

## 2020-06-02 ENCOUNTER — Other Ambulatory Visit: Payer: Self-pay | Admitting: *Deleted

## 2020-06-02 ENCOUNTER — Encounter: Payer: Self-pay | Admitting: *Deleted

## 2020-06-02 DIAGNOSIS — E1169 Type 2 diabetes mellitus with other specified complication: Secondary | ICD-10-CM

## 2020-06-02 NOTE — Progress Notes (Signed)
lipid

## 2020-06-06 ENCOUNTER — Other Ambulatory Visit: Payer: Self-pay | Admitting: Internal Medicine

## 2020-06-06 DIAGNOSIS — IMO0002 Reserved for concepts with insufficient information to code with codable children: Secondary | ICD-10-CM

## 2020-06-06 DIAGNOSIS — E1165 Type 2 diabetes mellitus with hyperglycemia: Secondary | ICD-10-CM

## 2020-06-06 MED ORDER — VICTOZA 18 MG/3ML ~~LOC~~ SOPN
PEN_INJECTOR | SUBCUTANEOUS | 2 refills | Status: DC
Start: 2020-06-06 — End: 2020-06-23

## 2020-06-06 NOTE — Telephone Encounter (Signed)
Copied from CRM [id number]. Topic: Quick Communication - Rx Refill/Question >> Jun 06, 2020  2:38 PM Mcneil, Ja-Kwan wrote: Medication: liraglutide (VICTOZA) 18 MG/3ML SOPN  Has the patient contacted their pharmacy? yes   Preferred Pharmacy (with phone number or street name): [facility] [address] [place]), [place] - [id number]  Phone: [phone]   Fax: [phone]  Agent: Please be advised that RX refills may take up to 3 business days. We ask that you follow-up with your pharmacy.

## 2020-06-06 NOTE — Telephone Encounter (Signed)
Requested Prescriptions  Pending Prescriptions Disp Refills  . liraglutide (VICTOZA) 18 MG/3ML SOPN 6 mL 2    Sig: START WITH INJECTING 0.6MG  SUBCUTANEOUSLY ONCE A D AY FOR 7 DAYS, THEN INCREASE TO 1.2MG  DAILY FOR 7 DAYS, THEN 1.8MG  DAILY THEREAFTER     Endocrinology:  Diabetes - GLP-1 Receptor Agonists Passed - 06/06/2020  2:52 PM      Passed - HBA1C is between 0 and 7.9 and within 180 days    HbA1c, POC (controlled diabetic range)  Date Value Ref Range Status  01/28/2020 7.4 (A) 0.0 - 7.0 % Final         Passed - Valid encounter within last 6 months    Recent Outpatient Visits          4 months ago Uncontrolled type 2 diabetes mellitus with peripheral neuropathy (HCC)   [facility] [place], Gavin Pound B, MD   5 months ago Uncontrolled type 2 diabetes mellitus with peripheral neuropathy [facility])   [place] [facility] [place], Jeannett Senior L, RPH-CPP   6 months ago Uncontrolled type 2 diabetes mellitus with peripheral neuropathy [facility])   [place] [facility] [place], Cornelius Moras, RPH-CPP   7 months ago Uncontrolled type 2 diabetes mellitus with peripheral neuropathy [facility])   [place] [facility] Jonah Blue B, MD   10 months ago Uncontrolled type 2 diabetes mellitus with peripheral neuropathy [facility])   [facility] Marcine Matar, MD      Future Appointments            In 1 month [place], Madolyn Frieze, MD [facility] [place], [facility]   In 2 months Marcine Matar, MD [facility]

## 2020-06-14 ENCOUNTER — Other Ambulatory Visit: Payer: Self-pay

## 2020-06-14 ENCOUNTER — Telehealth: Payer: Self-pay

## 2020-06-14 NOTE — Telephone Encounter (Signed)
Victoza PA approved until 06/14/2021

## 2020-06-15 ENCOUNTER — Other Ambulatory Visit: Payer: Self-pay | Admitting: *Deleted

## 2020-06-15 DIAGNOSIS — E1169 Type 2 diabetes mellitus with other specified complication: Secondary | ICD-10-CM | POA: Diagnosis not present

## 2020-06-15 DIAGNOSIS — E785 Hyperlipidemia, unspecified: Secondary | ICD-10-CM | POA: Diagnosis not present

## 2020-06-15 LAB — LIPID PANEL
Chol/HDL Ratio: 5.1 ratio — ABNORMAL HIGH (ref 0.0–4.4)
Cholesterol, Total: 223 mg/dL — ABNORMAL HIGH (ref 100–199)
HDL: 44 mg/dL (ref >39)
LDL Chol Calc (NIH): 162 mg/dL — ABNORMAL HIGH (ref 0–99)
Triglycerides: 96 mg/dL (ref 0–149)
VLDL Cholesterol Cal: 17 mg/dL (ref 5–40)

## 2020-06-15 LAB — HEPATIC FUNCTION PANEL
ALT: 6 IU/L (ref 0–32)
AST: 11 IU/L (ref 0–40)
Albumin: 4.2 g/dL (ref 3.8–4.9)
Alkaline Phosphatase: 78 IU/L (ref 44–121)
Bilirubin Total: 0.7 mg/dL (ref 0.0–1.2)
Bilirubin, Direct: 0.15 mg/dL (ref 0.00–0.40)
Total Protein: 6.6 g/dL (ref 6.0–8.5)

## 2020-06-21 ENCOUNTER — Telehealth: Payer: Self-pay | Admitting: Cardiology

## 2020-06-21 DIAGNOSIS — E1169 Type 2 diabetes mellitus with other specified complication: Secondary | ICD-10-CM

## 2020-06-21 MED ORDER — ROSUVASTATIN CALCIUM 40 MG PO TABS: 40.0000 mg | ORAL_TABLET | Freq: Every day | ORAL | 3 refills | Status: DC

## 2020-06-21 NOTE — Telephone Encounter (Signed)
Spoke with pt, she has not been taking the rosuvastatin. New script sent to the pharmacy and Lab orders mailed to the pt

## 2020-06-21 NOTE — Telephone Encounter (Signed)
Pt is returning call from earlier this morning in regards to her results. Please advise

## 2020-06-23 ENCOUNTER — Other Ambulatory Visit: Payer: Self-pay

## 2020-06-23 ENCOUNTER — Ambulatory Visit: Payer: Medicaid Other | Attending: Internal Medicine | Admitting: Internal Medicine

## 2020-06-23 ENCOUNTER — Encounter: Payer: Self-pay | Admitting: Internal Medicine

## 2020-06-23 VITALS — BP 117/63 | HR 93 | Resp 16 | Wt 199.0 lb

## 2020-06-23 DIAGNOSIS — E1122 Type 2 diabetes mellitus with diabetic chronic kidney disease: Secondary | ICD-10-CM | POA: Diagnosis not present

## 2020-06-23 DIAGNOSIS — F419 Anxiety disorder, unspecified: Secondary | ICD-10-CM | POA: Insufficient documentation

## 2020-06-23 DIAGNOSIS — F32A Depression, unspecified: Secondary | ICD-10-CM | POA: Diagnosis not present

## 2020-06-23 DIAGNOSIS — Z8249 Family history of ischemic heart disease and other diseases of the circulatory system: Secondary | ICD-10-CM | POA: Insufficient documentation

## 2020-06-23 DIAGNOSIS — Z560 Unemployment, unspecified: Secondary | ICD-10-CM | POA: Insufficient documentation

## 2020-06-23 DIAGNOSIS — I130 Hypertensive heart and chronic kidney disease with heart failure and stage 1 through stage 4 chronic kidney disease, or unspecified chronic kidney disease: Secondary | ICD-10-CM | POA: Diagnosis not present

## 2020-06-23 DIAGNOSIS — I7771 Dissection of carotid artery: Secondary | ICD-10-CM | POA: Insufficient documentation

## 2020-06-23 DIAGNOSIS — Z79899 Other long term (current) drug therapy: Secondary | ICD-10-CM | POA: Diagnosis not present

## 2020-06-23 DIAGNOSIS — Z888 Allergy status to other drugs, medicaments and biological substances status: Secondary | ICD-10-CM | POA: Diagnosis not present

## 2020-06-23 DIAGNOSIS — Z886 Allergy status to analgesic agent status: Secondary | ICD-10-CM | POA: Diagnosis not present

## 2020-06-23 DIAGNOSIS — Z885 Allergy status to narcotic agent status: Secondary | ICD-10-CM | POA: Insufficient documentation

## 2020-06-23 DIAGNOSIS — N182 Chronic kidney disease, stage 2 (mild): Secondary | ICD-10-CM | POA: Diagnosis not present

## 2020-06-23 DIAGNOSIS — E1142 Type 2 diabetes mellitus with diabetic polyneuropathy: Secondary | ICD-10-CM | POA: Insufficient documentation

## 2020-06-23 DIAGNOSIS — I5042 Chronic combined systolic (congestive) and diastolic (congestive) heart failure: Secondary | ICD-10-CM | POA: Diagnosis not present

## 2020-06-23 DIAGNOSIS — E785 Hyperlipidemia, unspecified: Secondary | ICD-10-CM | POA: Diagnosis not present

## 2020-06-23 DIAGNOSIS — Z760 Encounter for issue of repeat prescription: Secondary | ICD-10-CM | POA: Diagnosis not present

## 2020-06-23 DIAGNOSIS — E559 Vitamin D deficiency, unspecified: Secondary | ICD-10-CM | POA: Diagnosis not present

## 2020-06-23 DIAGNOSIS — E1169 Type 2 diabetes mellitus with other specified complication: Secondary | ICD-10-CM | POA: Diagnosis not present

## 2020-06-23 DIAGNOSIS — Z794 Long term (current) use of insulin: Secondary | ICD-10-CM | POA: Insufficient documentation

## 2020-06-23 DIAGNOSIS — D631 Anemia in chronic kidney disease: Secondary | ICD-10-CM | POA: Insufficient documentation

## 2020-06-23 DIAGNOSIS — J4520 Mild intermittent asthma, uncomplicated: Secondary | ICD-10-CM | POA: Diagnosis not present

## 2020-06-23 DIAGNOSIS — Z5901 Sheltered homelessness: Secondary | ICD-10-CM | POA: Diagnosis not present

## 2020-06-23 DIAGNOSIS — Z7984 Long term (current) use of oral hypoglycemic drugs: Secondary | ICD-10-CM | POA: Insufficient documentation

## 2020-06-23 LAB — POCT GLYCOSYLATED HEMOGLOBIN (HGB A1C): HbA1c, POC (controlled diabetic range): 6.4 % (ref 0.0–7.0)

## 2020-06-23 LAB — GLUCOSE, POCT (MANUAL RESULT ENTRY): POC Glucose: 214 mg/dl — AB (ref 70–99)

## 2020-06-23 MED ORDER — VICTOZA 18 MG/3ML ~~LOC~~ SOPN: PEN_INJECTOR | SUBCUTANEOUS | 11 refills | Status: DC

## 2020-06-23 MED ORDER — ALBUTEROL SULFATE HFA 108 (90 BASE) MCG/ACT IN AERS: 2.0000 | INHALATION_SPRAY | Freq: Four times a day (QID) | RESPIRATORY_TRACT | 6 refills | Status: DC | PRN

## 2020-06-23 MED ORDER — FUROSEMIDE 40 MG PO TABS: ORAL_TABLET | ORAL | 2 refills | Status: DC

## 2020-06-23 NOTE — Progress Notes (Signed)
Patient ID: [name], female    DOB: January 07, 1966  MRN: [id number]  CC: Diabetes, Hypertension, and Medication Refill   Subjective: [name] is a 55 y.o. female who presents for chronic ds management Her concerns today include: DMwith neuropathy, HL, HTN, DCM/sys and diastolicCHF with [id number] 08/2019, lung nodulesresolved, hx ofLT commoncarotid dissection, CKD 2-3, mild anemia, homeless, vit D def, anxiety/depression, mild intermittent asthma.    Since last visit with me, she has found more secure housing and is moved out of the motel.  She is now renting a house.   DM: Results for orders placed or performed in visit on 06/23/20  POCT glucose (manual entry)  Result Value Ref Range   POC Glucose 214 (A) 70 - 99 mg/dl  POCT glycosylated hemoglobin (Hb A1C)  Result Value Ref Range   Hemoglobin A1C     HbA1c POC (<> result, manual entry)     HbA1c, POC (prediabetic range)     HbA1c, POC (controlled diabetic range) 6.4 0.0 - 7.0 %   Meds: out of Victozia 2 wks ago.  Still Lantus 38 units and Metformin  Checks BS once a wk. BS good when she checks it No hypoglycemia No numbness in feet. Had exam at [facility] 05/07/2020 and got new glasses.  Patient has her receipt with her to show me. Does a lot of walking at work.  She also walks at the park close to her house several days a week.  HYPERTENSION/CHF Currently taking: see medication list Med Adherence: _0  Yes -Coreg, Spiro, Isosorbide, Hydralazine, Lasix Medication side effects: _1  Yes    _2  No Adherence with salt restriction: _3  Yes    _4  No Home Monitoring?: _5  Yes    _6  No Monitoring Frequency: _7  Yes    _8  No Home BP results range: _9  Yes    _10  No SOB? _11  Yes    _12  No Chest Pain?: _13  Yes    _14  No Leg swelling?: _15  Yes    _16  No Headaches?: _17  Yes    _18  No Dizziness? _19  Yes    _20  No Comments:   HL: Saw cardiology recently and had lipid profile done.  Levels were not at goal.  LDL was 178.   Patient states that she had been out of Crestor for quite some time.  The cardiologist sent a refill on the 10th of this month but she has not picked it up as yet.  She plans to do so today.  Intermittent asthma: Requests refill on albuterol.  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  It is during those times that she has to use her inhaler.  Marland Kitchen   HM: Cologuard ordered 07/2019.  Pt states she did mail it off to [facility].  We did not receive results  Patient Active Problem List   Diagnosis Date Noted  . Sinus tachycardia 10/15/2018  . Vitamin D deficiency 03/26/2018  . Homeless 03/24/2018  . Microalbuminuria 08/11/2017  . Anemia 08/08/2017  . Physical deconditioning   . Hyperlipidemia 02/21/2017  . Diabetic polyneuropathy associated with type 2 diabetes mellitus ([place]) 02/21/2017  . Lung nodules 07/16/2016  . Uncontrolled type 2 diabetes mellitus without complication, with long-term current use of insulin 07/16/2016  . DCM (dilated cardiomyopathy) ([place])   . Acute on chronic combined systolic and diastolic CHF (congestive heart failure) ([place]) 06/10/2016  . Essential hypertension 06/10/2016  . Dyslipidemia associated with type 2 diabetes mellitus ([place]) 06/10/2016     Current Outpatient  Medications on File Prior to Visit  Medication Sig Dispense Refill  . Accu-Chek Softclix Lancets lancets Use to check blood sugar up to 3 times daily. E11.42 100 each 2  . albuterol (PROVENTIL) (2.5 MG/3ML) 0.083% nebulizer solution USE 3 MILLILITERS IN NEBULIZER EVERY 6 HOURS AS NEEDED FOR WHEEZING FOR SHORTNESS OF BREATH 75 mL 0  . aspirin 81 MG chewable tablet Chew 1 tablet (81 mg total) by mouth daily. 30 tablet 3  . blood glucose meter kit and supplies Dispense based on patient and insurance preference. Use up to four times daily as directed. (FOR ICD-9 250.00, 250.01). 1 each 0  . Blood Glucose Monitoring Suppl (ACCU-CHEK GUIDE ME) w/Device KIT 1 kit by Does not apply route 3 (three) times daily. Use  to check blood sugar up to 3 times daily. 1 kit 0  . carvedilol (COREG) 12.5 MG tablet Take 1 tablet (12.5 mg total) by mouth 2 (two) times daily with a meal. 180 tablet 3  . DULoxetine (CYMBALTA) 20 MG capsule Take 1 capsule (20 mg total) by mouth daily. 30 capsule 5  . glucose blood (ACCU-CHEK GUIDE) test strip Use as instructed to check blood sugar up to 3 times daily. 100 each 11  . hydrALAZINE (APRESOLINE) 50 MG tablet Take 1 tablet (50 mg total) by mouth 3 (three) times daily. 270 tablet 3  . insulin glargine (LANTUS SOLOSTAR) 100 UNIT/ML Solostar Pen INJECT 38 UNITS SUBCUTANEOUSLY AT BEDTIME 15 mL 5  . isosorbide mononitrate (IMDUR) 30 MG 24 hr tablet Take 1 tablet by mouth once daily 90 tablet 3  . loratadine (CLARITIN) 10 MG tablet Take 1 tablet (10 mg total) by mouth daily. 30 tablet 0  . metFORMIN (GLUCOPHAGE-XR) 500 MG 24 hr tablet Take 1 tablet by mouth once daily with breakfast 90 tablet 0  . rosuvastatin (CRESTOR) 40 MG tablet Take 1 tablet (40 mg total) by mouth daily. 90 tablet 3  . spironolactone (ALDACTONE) 25 MG tablet Take 1 tablet by mouth once daily 90 tablet 0  . TRUEPLUS PEN NEEDLES 31G X 6 MM MISC USE AS DIRECTED 100 each 1   No current facility-administered medications on file prior to visit.    Allergies  Allergen Reactions  . Lisinopril Hives and Swelling    Facial   . Sertraline Hives and Swelling    Facial   . Tramadol Hives and Swelling    facial  . Ibuprofen Hives and Swelling    Social History   Socioeconomic History  . Marital status: Divorced    Spouse name: Not on file  . Number of children: 2  . Years of education: Not on file  . Highest education level: Not on file  Occupational History  . Not on file  Tobacco Use  . Smoking status: Never Smoker  . Smokeless tobacco: Never Used  Vaping Use  . Vaping Use: Never used  Substance and Sexual Activity  . Alcohol use: No  . Drug use: No  . Sexual activity: Yes    Birth control/protection:  None  Other Topics Concern  . Not on file  Social History Narrative   Lives in [place].  Presently unemployed but attends school full time   Social Determinants of Radio broadcast assistant Strain: Not on file  Food Insecurity: Not on file  Transportation Needs: Not on file  Physical Activity: Not on file  Stress: Not on file  Social Connections: Not on file  Intimate Partner Violence: Not on file  Family History  Problem Relation Age of Onset  . Diabetes Other        multiple  . Heart failure Paternal Grandmother   . Breast cancer Paternal Grandmother   . Heart disease Other        multiple  . Breast cancer Mother     Past Surgical History:  Procedure Laterality Date  . CERVIX LESION DESTRUCTION    . DILATION AND CURETTAGE OF UTERUS    . LEFT HEART CATHETERIZATION WITH CORONARY ANGIOGRAM N/A 05/21/2011   Procedure: LEFT HEART CATHETERIZATION WITH CORONARY ANGIOGRAM;  Surgeon: Sherren Mocha, MD;  Location: [facility] C[facility] LAB;  Service: Cardiovascular;  Laterality: N/A;  . TUBAL LIGATION  1996    ROS: Review of Systems Negative except as stated above  PHYSICAL EXAM: BP 117/63   Pulse 93   Resp 16   Wt 199 lb (90.3 kg)   SpO2 100%   BMI 29.39 kg/m   Wt Readings from Last 3 Encounters:  06/23/20 199 lb (90.3 kg)  01/28/20 202 lb 3.2 oz (91.7 kg)  01/25/20 204 lb (92.5 kg)    Physical Exam  General appearance - alert, well appearing, and in no distress Mental status - normal mood, behavior, speech, dress, motor activity, and thought processes Neck - supple, no significant adenopathy Chest - clear to auscultation, no wheezes, rales or rhonchi, symmetric air entry Heart - normal rate, regular rhythm, normal S1, S2, no murmurs, rubs, clicks or gallops Extremities - peripheral pulses normal, no pedal edema, no clubbing or cyanosis  Depression screen [facility] 2/9 06/23/2020 01/28/2020 10/29/2019  Decreased Interest 0 0 0  Down, Depressed, Hopeless 0 0 0  PHQ - 2  Score 0 0 0  Altered sleeping - - -  Tired, decreased energy - - -  Change in appetite - - -  Feeling bad or failure about yourself  - - -  Trouble concentrating - - -  Moving slowly or fidgety/restless - - -  Suicidal thoughts - - -  PHQ-9 Score - - -  Difficult doing work/chores - - -  Some recent data might be hidden     CMP Latest Ref Rng & Units 06/15/2020 02/09/2020 06/22/2019  Glucose 65 - 99 mg/dL - 144(H) 230(H)  BUN 6 - 24 mg/dL - 17 15  Creatinine 0.57 - 1.00 mg/dL - 1.07(H) 1.07(H)  Sodium 134 - 144 mmol/L - 139 136  Potassium 3.5 - 5.2 mmol/L - 4.2 4.5  Chloride 96 - 106 mmol/L - 101 101  CO2 20 - 29 mmol/L - 25 23  Calcium 8.7 - 10.2 mg/dL - 9.8 9.3  Total Protein 6.0 - 8.5 g/dL 6.6 6.8 6.5  Total Bilirubin 0.0 - 1.2 mg/dL 0.7 0.5 0.7  Alkaline Phos 44 - 121 IU/L 78 82 80  AST 0 - 40 IU/L _0 ALT 0 - 32 IU/L _1 Lipid Panel     Component Value Date/Time   CHOL 223 (H) 06/15/2020 0809   TRIG 96 06/15/2020 0809   HDL 44 06/15/2020 0809   CHOLHDL 5.1 (H) 06/15/2020 0809   CHOLHDL 6.4 (H) 05/05/2015 1645   VLDL 37 (H) 05/05/2015 1645   LDLCALC 162 (H) 06/15/2020 0809    CBC    Component Value Date/Time   WBC 7.4 10/22/2018 0923   WBC 8.9 07/25/2017 0837   RBC 5.06 10/22/2018 0923   RBC 3.71 (L) 07/25/2017 0837   HGB 14.5 10/22/2018 0923   HCT  44.6 10/22/2018 0923   PLT 337 10/22/2018 0923   MCV 88 10/22/2018 0923   MCH 28.7 10/22/2018 0923   MCH 26.4 07/25/2017 0837   MCHC 32.5 10/22/2018 0923   MCHC 30.6 07/25/2017 0837   RDW 13.7 10/22/2018 0923   LYMPHSABS 2.5 07/31/2017 1225   MONOABS 0.6 06/02/2017 0530   EOSABS 0.2 07/31/2017 1225   BASOSABS 0.1 07/31/2017 1225    ASSESSMENT AND PLAN: 1. Type 2 diabetes mellitus with peripheral neuropathy (HCC) At goal.  She will continue Victoza, Lantus and metformin.  Refill sent on Victoza.  Encouraged her to check the blood sugars at least once a day.  If she starts having hypoglycemic  episodes, she can decrease the Lantus insulin by 6 units.  Continue healthy eating habits and regular exercise. - POCT glucose (manual entry) - POCT glycosylated hemoglobin (Hb A1C) - Microalbumin / creatinine urine ratio - liraglutide (VICTOZA) 18 MG/3ML SOPN; START WITH INJECTING 0.6MG SUBCUTANEOUSLY ONCE A D AY FOR 7 DAYS, THEN INCREASE TO 1.2MG DAILY FOR 7 DAYS, THEN 1.8MG DAILY THEREAFTER  Dispense: 6 mL; Refill: 11  2. Chronic combined systolic and diastolic CHF, NYHA class 1 (HCC) Compensated.  Continue current medications including spironolactone, hydralazine, furosemide and isosorbide.  Continue low-salt diet - furosemide (LASIX) 40 MG tablet; TAKE 1 TABLET BY MOUTH ONCE DAILY FOR FLUID/HEART FAILURE  Dispense: 90 tablet; Refill: 2  3. Hyperlipidemia associated with type 2 diabetes mellitus ([place]) Told patient that she should pick up the Crestor as soon as possible and start taking it.  A1c is not at goal.  4. Mild intermittent asthma without complication - albuterol (VENTOLIN HFA) 108 (90 Base) MCG/ACT inhaler; Inhale 2 puffs into the lungs every 6 (six) hours as needed for wheezing or shortness of breath.  Dispense: 18 g; Refill: 6     Patient was given the opportunity to ask questions.  Patient verbalized understanding of the plan and was able to repeat key elements of the plan.   Orders Placed This Encounter  Procedures  . Microalbumin / creatinine urine ratio  . POCT glucose (manual entry)  . POCT glycosylated hemoglobin (Hb A1C)     Requested Prescriptions   Signed Prescriptions Disp Refills  . liraglutide (VICTOZA) 18 MG/3ML SOPN 6 mL 11    Sig: START WITH INJECTING 0.6MG SUBCUTANEOUSLY ONCE A D AY FOR 7 DAYS, THEN INCREASE TO 1.2MG DAILY FOR 7 DAYS, THEN 1.8MG DAILY THEREAFTER  . albuterol (VENTOLIN HFA) 108 (90 Base) MCG/ACT inhaler 18 g 6    Sig: Inhale 2 puffs into the lungs every 6 (six) hours as needed for wheezing or shortness of breath.  . furosemide (LASIX)  40 MG tablet 90 tablet 2    Sig: TAKE 1 TABLET BY MOUTH ONCE DAILY FOR FLUID/HEART FAILURE    Return in about 4 months (around 10/24/2020).  Karle Plumber, MD, FACP

## 2020-06-23 NOTE — Patient Instructions (Signed)
I have sent refill on the Victoza and albuterol to your pharmacy.  Please remember to also ask for refill on your cholesterol medicine called Rosuvastatin.

## 2020-06-24 LAB — MICROALBUMIN / CREATININE URINE RATIO
Creatinine, Urine: 41.4 mg/dL
Microalb/Creat Ratio: <7 mg/g creat (ref 0–29)
Microalbumin, Urine: <3 ug/mL

## 2020-06-27 ENCOUNTER — Telehealth: Payer: Self-pay

## 2020-06-27 NOTE — Telephone Encounter (Signed)
PA for Victoza approved until 06/23/21

## 2020-07-14 NOTE — Progress Notes (Deleted)
HPI: FUCM/CHF. Echocardiogram in April of 2013 showed an ejection fraction of 35%, moderate left ventricular enlargement, mild to moderate mitral regurgitation. HIV negative and TSH normal. Cardiac catheterization in April of 2013 showed normal coronary arteries and an ejection fraction of 35-40%. Patient was treated with medications and her cardiomyopathy was felt possibly secondary to hypertension. Echocardiogram repeated in September of 2013. Her ejection fraction was 30-35%. Patient referred to Dr. Rayann Heman for consideration of ICD but the patient decided not to pursue. Also with h/o carotid dissection healed on CTA 4/18.Echocardiogram repeated July 2021 and showed ejection fraction 35 to 40%, moderate left ventricular enlargement, moderate left atrial enlargement, mild to moderate mitral regurgitation.  Sincelast seen,  Current Outpatient Medications  Medication Sig Dispense Refill  . Accu-Chek Softclix Lancets lancets Use to check blood sugar up to 3 times daily. E11.42 100 each 2  . albuterol (PROVENTIL) (2.5 MG/3ML) 0.083% nebulizer solution USE 3 MILLILITERS IN NEBULIZER EVERY 6 HOURS AS NEEDED FOR WHEEZING FOR SHORTNESS OF BREATH 75 mL 0  . albuterol (VENTOLIN HFA) 108 (90 Base) MCG/ACT inhaler Inhale 2 puffs into the lungs every 6 (six) hours as needed for wheezing or shortness of breath. 18 g 6  . aspirin 81 MG chewable tablet Chew 1 tablet (81 mg total) by mouth daily. 30 tablet 3  . blood glucose meter kit and supplies Dispense based on patient and insurance preference. Use up to four times daily as directed. (FOR ICD-9 250.00, 250.01). 1 each 0  . Blood Glucose Monitoring Suppl (ACCU-CHEK GUIDE ME) w/Device KIT 1 kit by Does not apply route 3 (three) times daily. Use to check blood sugar up to 3 times daily. 1 kit 0  . carvedilol (COREG) 12.5 MG tablet Take 1 tablet (12.5 mg total) by mouth 2 (two) times daily with a meal. 180 tablet 3  . DULoxetine (CYMBALTA) 20 MG capsule Take  1 capsule (20 mg total) by mouth daily. 30 capsule 5  . furosemide (LASIX) 40 MG tablet TAKE 1 TABLET BY MOUTH ONCE DAILY FOR FLUID/HEART FAILURE 90 tablet 2  . glucose blood (ACCU-CHEK GUIDE) test strip Use as instructed to check blood sugar up to 3 times daily. 100 each 11  . hydrALAZINE (APRESOLINE) 50 MG tablet Take 1 tablet (50 mg total) by mouth 3 (three) times daily. 270 tablet 3  . insulin glargine (LANTUS SOLOSTAR) 100 UNIT/ML Solostar Pen INJECT 38 UNITS SUBCUTANEOUSLY AT BEDTIME 15 mL 5  . isosorbide mononitrate (IMDUR) 30 MG 24 hr tablet Take 1 tablet by mouth once daily 90 tablet 3  . liraglutide (VICTOZA) 18 MG/3ML SOPN START WITH INJECTING 0.6MG SUBCUTANEOUSLY ONCE A D AY FOR 7 DAYS, THEN INCREASE TO 1.2MG DAILY FOR 7 DAYS, THEN 1.8MG DAILY THEREAFTER 6 mL 11  . loratadine (CLARITIN) 10 MG tablet Take 1 tablet (10 mg total) by mouth daily. 30 tablet 0  . metFORMIN (GLUCOPHAGE-XR) 500 MG 24 hr tablet Take 1 tablet by mouth once daily with breakfast 90 tablet 0  . rosuvastatin (CRESTOR) 40 MG tablet Take 1 tablet (40 mg total) by mouth daily. 90 tablet 3  . spironolactone (ALDACTONE) 25 MG tablet Take 1 tablet by mouth once daily 90 tablet 0  . TRUEPLUS PEN NEEDLES 31G X 6 MM MISC USE AS DIRECTED 100 each 1   No current facility-administered medications for this visit.     Past Medical History:  Diagnosis Date  . Asthma 05/31/2017  . Diabetes mellitus, type 2 ([place])  A1C 7.6 April '13  . Hypertension   . Left leg pain 07/21/2015  . Nonischemic cardiomyopathy ([place])    Echo 05/19/11 EF 35% w/ mild-mod MR; R/L Heart Cath 05/21/11 - mildly elevated right heart pressures, normal left heart pressures, preserved cardiac output, widely patent coronary arteries without significant CAD and moderate global systolic LV dysfunction, EF 35-40%.  . 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 Systolic CHF ([place])    Echo 05/19/11 EF 35% w/ mild-mod MR    Past Surgical  History:  Procedure Laterality Date  . CERVIX LESION DESTRUCTION    . DILATION AND CURETTAGE OF UTERUS    . LEFT HEART CATHETERIZATION WITH CORONARY ANGIOGRAM N/A 05/21/2011   Procedure: LEFT HEART CATHETERIZATION WITH CORONARY ANGIOGRAM;  Surgeon: Sherren Mocha, MD;  [facility] C[facility] LAB;  Service: Cardiovascular;  Laterality: N/A;  . TUBAL LIGATION  1996    Social History   Socioeconomic History  . Marital status: Divorced    Spouse name: Not on file  . Number of children: 2  . Years of education: Not on file  . Highest education level: Not on file  Occupational History  . Not on file  Tobacco Use  . Smoking status: Never Smoker  . Smokeless tobacco: Never Used  Vaping Use  . Vaping Use: Never used  Substance and Sexual Activity  . Alcohol use: No  . Drug use: No  . Sexual activity: Yes    Birth control/protection: None  Other Topics Concern  . Not on file  Social History Narrative   Lives in [place].  Presently unemployed but attends school full time   Social Determinants of Radio broadcast assistant Strain: Not on file  Food Insecurity: Not on file  Transportation Needs: Not on file  Physical Activity: Not on file  Stress: Not on file  Social Connections: Not on file  Intimate Partner Violence: Not on file    Family History  Problem Relation Age of Onset  . Diabetes Other        multiple  . Heart failure Paternal Grandmother   . Breast cancer Paternal Grandmother   . Heart disease Other        multiple  . Breast cancer Mother     ROS: no fevers or chills, productive cough, hemoptysis, dysphasia, odynophagia, melena, hematochezia, dysuria, hematuria, rash, seizure activity, orthopnea, PND, pedal edema, claudication. Remaining systems are negative.  Physical Exam: Well-developed well-nourished in no acute distress.  Skin is warm and dry.  HEENT is normal.  Neck is supple.  Chest is clear to auscultation with normal expansion.  Cardiovascular exam  is regular rate and rhythm.  Abdominal exam nontender or distended. No masses palpated. Extremities show no edema. neuro grossly intact  ECG- personally reviewed  A/P  1 nonischemic cardiomyopathy-we will continue present dose of hydralazine/nitrates and beta-blocker.  Note she has a history of angioedema with ACE inhibitor's.  She has declined ICD in the past.  Echocardiogram most recently shows some improvement in LV function.  2 chronic combined systolic/diastolic congestive heart failure-euvolemic.  Continue diuretics at present dose.  Check potassium and renal function.  3 hypertension-blood pressure controlled.  Continue present medical regimen.  4 mitral regurgitation-mild to moderate on most recent echocardiogram.  She will need follow-up studies in the future.  5 hyperlipidemia-continue statin.  6 history of left common carotid artery dissection-healed on most recent CT scan.  7 history of noncompliance.  Kirk Ruths, MD

## 2020-07-18 ENCOUNTER — Other Ambulatory Visit: Payer: Self-pay | Admitting: Internal Medicine

## 2020-07-18 ENCOUNTER — Other Ambulatory Visit: Payer: Self-pay | Admitting: Cardiology

## 2020-07-18 DIAGNOSIS — I5021 Acute systolic (congestive) heart failure: Secondary | ICD-10-CM

## 2020-07-18 DIAGNOSIS — I10 Essential (primary) hypertension: Secondary | ICD-10-CM

## 2020-07-18 DIAGNOSIS — I428 Other cardiomyopathies: Secondary | ICD-10-CM

## 2020-07-18 DIAGNOSIS — IMO0002 Reserved for concepts with insufficient information to code with codable children: Secondary | ICD-10-CM

## 2020-07-18 DIAGNOSIS — E1165 Type 2 diabetes mellitus with hyperglycemia: Secondary | ICD-10-CM

## 2020-07-18 NOTE — Telephone Encounter (Signed)
Requested Prescriptions  Pending Prescriptions Disp Refills  . metFORMIN (GLUCOPHAGE-XR) 500 MG 24 hr tablet [Pharmacy Med Name: metFORMIN HCl ER 500 MG Oral Tablet Extended Release 24 Hour] 90 tablet 0    Sig: Take 1 tablet by mouth once daily with breakfast     Endocrinology:  Diabetes - Biguanides Failed - 07/18/2020 12:57 PM      Failed - Cr in normal range and within 360 days    Creat  Date Value Ref Range Status  06/29/2016 0.92 0.50 - 1.10 mg/dL Final   Creatinine, Ser  Date Value Ref Range Status  02/09/2020 1.07 (H) 0.57 - 1.00 mg/dL Final         Passed - HBA1C is between 0 and 7.9 and within 180 days    HbA1c, POC (controlled diabetic range)  Date Value Ref Range Status  06/23/2020 6.4 0.0 - 7.0 % Final         Passed - AA eGFR in normal range and within 360 days    GFR, Est African American  Date Value Ref Range Status  05/05/2015 80 >=60 mL/min Final   GFR calc Af Amer  Date Value Ref Range Status  02/09/2020 69 >59 mL/min/1.73 Final    Comment:    **In accordance with recommendations from the NKF-ASN Task force,**   [facility] is in the process of updating its eGFR calculation to the   2021 CKD-EPI creatinine equation that estimates kidney function   without a race variable.    GFR, Est Non African American  Date Value Ref Range Status  05/05/2015 70 >=60 mL/min Final    Comment:      The estimated GFR is a calculation valid for adults (>=80 years old) that uses the CKD-EPI algorithm to adjust for age and sex. It is   not to be used for children, pregnant women, hospitalized patients,    patients on dialysis, or with rapidly changing kidney function. According to the NKDEP, eGFR >89 is normal, 60-89 shows mild impairment, 30-59 shows moderate impairment, 15-29 shows severe impairment and <15 is ESRD.      GFR calc non Af Amer  Date Value Ref Range Status  02/09/2020 60 >59 mL/min/1.73 Final   GFR  Date Value Ref Range Status  10/30/2012 83.74 >60.00  mL/min Final         Passed - Valid encounter within last 6 months    Recent Outpatient Visits          3 weeks ago Type 2 diabetes mellitus with peripheral neuropathy [facility])   [place], Neoma Laming B, MD   5 months ago Uncontrolled type 2 diabetes mellitus with peripheral neuropathy [facility])   [place] Karle Plumber B, MD   7 months ago Uncontrolled type 2 diabetes mellitus with peripheral neuropathy [facility])   [place], Annie Main L, RPH-CPP   8 months ago Uncontrolled type 2 diabetes mellitus with peripheral neuropathy [facility])   [place], Jarome Matin, RPH-CPP   8 months ago Uncontrolled type 2 diabetes mellitus with peripheral neuropathy [facility])   [place], MD      Future Appointments            In 1 week [place], Denice Bors, MD [facility], [facility]

## 2020-07-26 ENCOUNTER — Ambulatory Visit: Payer: Medicaid Other | Admitting: Cardiology

## 2020-08-05 ENCOUNTER — Ambulatory Visit: Payer: Medicaid Other | Admitting: Internal Medicine

## 2020-08-24 ENCOUNTER — Other Ambulatory Visit: Payer: Self-pay

## 2020-09-20 ENCOUNTER — Other Ambulatory Visit: Payer: Self-pay | Admitting: Cardiology

## 2020-09-20 DIAGNOSIS — I10 Essential (primary) hypertension: Secondary | ICD-10-CM

## 2020-09-26 ENCOUNTER — Other Ambulatory Visit: Payer: Self-pay | Admitting: Internal Medicine

## 2020-09-26 DIAGNOSIS — E1142 Type 2 diabetes mellitus with diabetic polyneuropathy: Secondary | ICD-10-CM

## 2020-09-26 DIAGNOSIS — IMO0002 Reserved for concepts with insufficient information to code with codable children: Secondary | ICD-10-CM

## 2020-10-28 ENCOUNTER — Other Ambulatory Visit (HOSPITAL_COMMUNITY): Payer: Self-pay

## 2020-10-31 ENCOUNTER — Other Ambulatory Visit: Payer: Self-pay | Admitting: Internal Medicine

## 2020-10-31 DIAGNOSIS — IMO0002 Reserved for concepts with insufficient information to code with codable children: Secondary | ICD-10-CM

## 2020-10-31 DIAGNOSIS — E1142 Type 2 diabetes mellitus with diabetic polyneuropathy: Secondary | ICD-10-CM

## 2020-11-12 ENCOUNTER — Other Ambulatory Visit: Payer: Self-pay | Admitting: Internal Medicine

## 2020-11-15 NOTE — Progress Notes (Deleted)
HPI: FU CM/CHF. Echocardiogram in April of 2013 showed an ejection fraction of 35%, moderate left ventricular enlargement, mild to moderate mitral regurgitation. HIV negative and TSH normal. Cardiac catheterization in April of 2013 showed normal coronary arteries and an ejection fraction of 35-40%. Patient was treated with medications and her cardiomyopathy was felt possibly secondary to hypertension. Echocardiogram repeated in September of 2013. Her ejection fraction was 30-35%. Patient referred to Dr. Rayann Heman for consideration of ICD but the patient decided not to pursue. Also with h/o carotid dissection healed on CTA 4/18. Echocardiogram repeated July 2021 and showed ejection fraction 35 to 40%, moderate left ventricular enlargement, moderate left atrial enlargement, mild to moderate mitral regurgitation.  Since last seen,  Current Outpatient Medications  Medication Sig Dispense Refill   Accu-Chek Softclix Lancets lancets Use to check blood sugar up to 3 times daily. E11.42 100 each 2   albuterol (PROVENTIL) (2.5 MG/3ML) 0.083% nebulizer solution USE 3 MILLILITERS IN NEBULIZER EVERY 6 HOURS AS NEEDED FOR WHEEZING FOR SHORTNESS OF BREATH 75 mL 0   albuterol (VENTOLIN HFA) 108 (90 Base) MCG/ACT inhaler Inhale 2 puffs into the lungs every 6 (six) hours as needed for wheezing or shortness of breath. 18 g 6   aspirin 81 MG chewable tablet Chew 1 tablet (81 mg total) by mouth daily. 30 tablet 3   blood glucose meter kit and supplies Dispense based on patient and insurance preference. Use up to four times daily as directed. (FOR ICD-9 250.00, 250.01). 1 each 0   Blood Glucose Monitoring Suppl (ACCU-CHEK GUIDE ME) w/Device KIT 1 kit by Does not apply route 3 (three) times daily. Use to check blood sugar up to 3 times daily. 1 kit 0   carvedilol (COREG) 12.5 MG tablet TAKE 1 TABLET BY MOUTH TWICE DAILY WITH A MEAL 180 tablet 1   DULoxetine (CYMBALTA) 20 MG capsule Take 1 capsule (20 mg total) by mouth  daily. 30 capsule 5   furosemide (LASIX) 40 MG tablet TAKE 1 TABLET BY MOUTH ONCE DAILY FOR FLUID/HEART FAILURE 90 tablet 2   glucose blood (ACCU-CHEK GUIDE) test strip Use as instructed to check blood sugar up to 3 times daily. 100 each 11   hydrALAZINE (APRESOLINE) 50 MG tablet TAKE 1 TABLET BY MOUTH THREE TIMES DAILY 270 tablet 0   isosorbide mononitrate (IMDUR) 30 MG 24 hr tablet Take 1 tablet by mouth once daily 90 tablet 3   LANTUS SOLOSTAR 100 UNIT/ML Solostar Pen INJECT 38 UNITS SUBCUTANEOUSLY AT BEDTIME 15 mL 0   liraglutide (VICTOZA) 18 MG/3ML SOPN START WITH INJECTING 0.6MG SUBCUTANEOUSLY ONCE A D AY FOR 7 DAYS, THEN INCREASE TO 1.2MG DAILY FOR 7 DAYS, THEN 1.8MG DAILY THEREAFTER 6 mL 11   loratadine (CLARITIN) 10 MG tablet Take 1 tablet (10 mg total) by mouth daily. 30 tablet 0   metFORMIN (GLUCOPHAGE-XR) 500 MG 24 hr tablet Take 1 tablet (500 mg total) by mouth daily with breakfast. OFFICE VISIT NEEDED FOR ADDITIONAL REFILLS 90 tablet 0   rosuvastatin (CRESTOR) 40 MG tablet Take 1 tablet (40 mg total) by mouth daily. 90 tablet 3   spironolactone (ALDACTONE) 25 MG tablet Take 1 tablet by mouth once daily 90 tablet 1   TRUEPLUS PEN NEEDLES 31G X 6 MM MISC USE AS DIRECTED 100 each 1   No current facility-administered medications for this visit.     Past Medical History:  Diagnosis Date   Asthma 05/31/2017   Diabetes mellitus, type 2 ([place])  A1C 7.6 April '13   Hypertension    Left leg pain 07/21/2015   Nonischemic cardiomyopathy ([place])    Echo 05/19/11 EF 35% w/ mild-mod MR; R/L Heart Cath 05/21/11 - mildly elevated right heart pressures, normal left heart pressures, preserved cardiac output, widely patent coronary arteries without significant CAD and moderate global systolic LV dysfunction, EF 35-40%.   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   right sided weakness, now resolved   Systolic CHF ([place])    Echo 05/19/11 EF 35% w/ mild-mod MR    Past Surgical History:  Procedure Laterality Date    CERVIX LESION DESTRUCTION     DILATION AND CURETTAGE OF UTERUS     LEFT HEART CATHETERIZATION WITH CORONARY ANGIOGRAM N/A 05/21/2011   Procedure: LEFT HEART CATHETERIZATION WITH CORONARY ANGIOGRAM;  Surgeon: Sherren Mocha, MD;  Location: [facility] CATH LAB;  Service: Cardiovascular;  Laterality: N/A;   TUBAL LIGATION  1996    Social History   Socioeconomic History   Marital status: Divorced    Spouse name: Not on file   Number of children: 2   Years of education: Not on file   Highest education level: Not on file  Occupational History   Not on file  Tobacco Use   Smoking status: Never   Smokeless tobacco: Never  Vaping Use   Vaping Use: Never used  Substance and Sexual Activity   Alcohol use: No   Drug use: No   Sexual activity: Yes    Birth control/protection: None  Other Topics Concern   Not on file  Social History Narrative   Lives in [place].  Presently unemployed but attends school full time   Social Determinants of Radio broadcast assistant Strain: Not on file  Food Insecurity: Not on file  Transportation Needs: Not on file  Physical Activity: Not on file  Stress: Not on file  Social Connections: Not on file  Intimate Partner Violence: Not on file    Family History  Problem Relation Age of Onset   Diabetes Other        multiple   Heart failure Paternal Grandmother    Breast cancer Paternal Grandmother    Heart disease Other        multiple   Breast cancer Mother     ROS: no fevers or chills, productive cough, hemoptysis, dysphasia, odynophagia, melena, hematochezia, dysuria, hematuria, rash, seizure activity, orthopnea, PND, pedal edema, claudication. Remaining systems are negative.  Physical Exam: Well-developed well-nourished in no acute distress.  Skin is warm and dry.  HEENT is normal.  Neck is supple.  Chest is clear to auscultation with normal expansion.  Cardiovascular exam is regular rate and rhythm.  Abdominal exam nontender or distended.  No masses palpated. Extremities show no edema. neuro grossly intact  ECG- personally reviewed  A/P  1 nonischemic cardiomyopathy-patient with history of angioedema with ACE inhibitor/ARB.  Continue hydralazine/nitrates.  Continue beta-blocker.  She declines ICD.  Most recent echocardiogram showed improved LV function.  We will repeat study.  2 chronic combined systolic/diastolic congestive heart failure-she is euvolemic on examination.  Continue diuretics at present dose.  Check potassium and renal function.  3 hypertension-blood pressure controlled.  Continue present medical regimen.  4 history of mitral regurgitation-plan follow-up echocardiogram.  5 previous left common carotid artery dissection-healed on most recent CT scan.  6 hyperlipidemia-continue statin.  7 history of noncompliance.  Kirk Ruths, MD

## 2020-11-24 ENCOUNTER — Ambulatory Visit: Payer: Medicaid Other | Admitting: Cardiology

## 2020-11-25 ENCOUNTER — Telehealth: Payer: Self-pay | Admitting: *Deleted

## 2020-11-25 NOTE — Telephone Encounter (Signed)
Copied from CRM [id number]. Topic: General - Other >> Nov 25, 2020  9:20 AM Gaetana Michaelis A wrote: Reason for CRM: The patient has called about their LANTUS SOLOSTAR 100 UNIT/ML Solostar Pen [[id number]]  prescription   The patient has been told by their new pharmacy that they must be prescribed a 90 day supply of the medication and the patient has additional concerns with going without their insulin for roughly a week  The patient will need their prescription modified and submitted to  [id number] G[place] Otto, [place] - [address] AT [facility] OF [facility] & [address]  Phone:  [phone] Fax:  [phone]  The patient is concerned with going without their insulin without a week and also concerned with the cost of their medications   Please contact the patient further when possible

## 2020-11-28 NOTE — Telephone Encounter (Signed)
Luke could you take care of this  ?

## 2020-11-29 MED ORDER — LANTUS SOLOSTAR 100 UNIT/ML ~~LOC~~ SOPN: 38.0000 [IU] | PEN_INJECTOR | Freq: Every day | SUBCUTANEOUS | 0 refills | Status: DC

## 2020-11-29 NOTE — Telephone Encounter (Signed)
Rx sent 

## 2021-01-10 ENCOUNTER — Other Ambulatory Visit: Payer: Self-pay | Admitting: Internal Medicine

## 2021-01-10 DIAGNOSIS — I420 Dilated cardiomyopathy: Secondary | ICD-10-CM

## 2021-01-12 DIAGNOSIS — Z419 Encounter for procedure for purposes other than remedying health state, unspecified: Secondary | ICD-10-CM | POA: Diagnosis not present

## 2021-01-17 ENCOUNTER — Encounter: Payer: Self-pay | Admitting: Internal Medicine

## 2021-01-17 ENCOUNTER — Ambulatory Visit: Payer: Medicaid Other | Attending: Internal Medicine | Admitting: Internal Medicine

## 2021-01-17 ENCOUNTER — Other Ambulatory Visit: Payer: Self-pay

## 2021-01-17 VITALS — BP 144/82 | HR 93 | Resp 16 | Wt 204.8 lb

## 2021-01-17 DIAGNOSIS — F419 Anxiety disorder, unspecified: Secondary | ICD-10-CM | POA: Diagnosis not present

## 2021-01-17 DIAGNOSIS — I10 Essential (primary) hypertension: Secondary | ICD-10-CM | POA: Diagnosis not present

## 2021-01-17 DIAGNOSIS — Z23 Encounter for immunization: Secondary | ICD-10-CM

## 2021-01-17 DIAGNOSIS — E1142 Type 2 diabetes mellitus with diabetic polyneuropathy: Secondary | ICD-10-CM

## 2021-01-17 DIAGNOSIS — I5042 Chronic combined systolic (congestive) and diastolic (congestive) heart failure: Secondary | ICD-10-CM | POA: Diagnosis not present

## 2021-01-17 DIAGNOSIS — F32A Depression, unspecified: Secondary | ICD-10-CM

## 2021-01-17 LAB — GLUCOSE, POCT (MANUAL RESULT ENTRY): POC Glucose: 219 mg/dl — AB (ref 70–99)

## 2021-01-17 LAB — POCT GLYCOSYLATED HEMOGLOBIN (HGB A1C): HbA1c, POC (controlled diabetic range): 7.5 % — AB (ref 0.0–7.0)

## 2021-01-17 NOTE — Progress Notes (Signed)
Patient ID: [name], female    DOB: [birth year]  MRN: [id number]  CC: Diabetes and Hypertension   Subjective: [name] is a 55 y.o. female who presents for chronic ds management Her concerns today include:  DM with neuropathy, HL, HTN, DCM/sys and diastolic CHF with WF09-32% on 08/2019, lung nodules resolved, hx of LT common carotid dissection, CKD 2-3, mild anemia,  vit D def, anxiety/depression, mild intermittent asthma.   Did not bring meds with her.  Patient presents today requesting letter for work.  She would like to request that she only work 40 hours a week.  She works for [employer] here in [place].  Patient states that over the past several weeks she has been working in excess of 120 hours/2 wks.  She has not had days off.  She is very stressed out about her work schedule and it is causing her to have anxiety attacks, depression with crying spells.  Also she has to use the bathroom more often during her shift because she is on furosemide and spironolactone.  Her supervisor is not pleased about her having to use the restroom when she is not on her break.  She spoke with them about it and was told that she can request a letter from her primary care doctor and they will allow her to use the restroom when she needs to.  She would also like the letter to say that she should not work no more than 40 hours a week due to her medical conditions.  DIABETES TYPE 2 Last A1C:   Results for orders placed or performed in visit on 01/17/21  POCT glucose (manual entry)  Result Value Ref Range   POC Glucose 219 (A) 70 - 99 mg/dl  POCT glycosylated hemoglobin (Hb A1C)  Result Value Ref Range   Hemoglobin A1C     HbA1c POC (<> result, manual entry)     HbA1c, POC (prediabetic range)     HbA1c, POC (controlled diabetic range) 7.5 (A) 0.0 - 7.0 %    Med Adherence:  [x]  Yes  but sometimes she forgets to take meds at consistent time.  She is on Victoza, metformin 500 mg  once a day and Lantus insulin 38 units. Medication side effects:  []  Yes    [x]  No Home Monitoring?  []  Yes    [x]  No Home glucose results range: Diet Adherence: []  Yes    [x]  No due to stress Exercise: [x]  Yes  walks a lot at work  []  No Hypoglycemic episodes?: []  Yes    []  No Numbness of the feet? []  Yes    []  No Retinopathy hx? []  Yes    []  No Last eye exam:  Comments:   HYPERTENSION/CHF Reports she missed appt with her cardiologist. Currently taking: see medication list.  She is on carvedilol, furosemide, hydralazine, isosorbide, spironolactone Med Adherence: [x]  Yes but out of Isosorbide x 1 wk   []  No Medication side effects: []  Yes    [x]  No Adherence with salt restriction: [x]  Yes    []  No Home Monitoring?: []  Yes    [x]  No Monitoring Frequency:  Home BP results range:  SOB? []  Yes    [x]  No except when she has an anxiety attack. Chest Pain?: []  Yes    [x]  No Leg swelling?: []  Yes    [x]  No Headaches?: []  Yes    [x]  No Dizziness? []  Yes    [x]  No Comments: No PND/orthopnea.  Patient  Active Problem List   Diagnosis Date Noted   Sinus tachycardia 10/15/2018   Vitamin D deficiency 03/26/2018   Homeless 03/24/2018   Microalbuminuria 08/11/2017   Anemia 08/08/2017   Physical deconditioning    Hyperlipidemia 02/21/2017   Diabetic polyneuropathy associated with type 2 diabetes mellitus ([place]) 02/21/2017   Lung nodules 07/16/2016   Uncontrolled type 2 diabetes mellitus without complication, with long-term current use of insulin 07/16/2016   DCM (dilated cardiomyopathy) ([place])    Acute on chronic combined systolic and diastolic CHF (congestive heart failure) ([place]) 06/10/2016   Essential hypertension 06/10/2016   Dyslipidemia associated with type 2 diabetes mellitus ([place]) 06/10/2016     Current Outpatient Medications on File Prior to Visit  Medication Sig Dispense Refill   Accu-Chek Softclix Lancets lancets Use to check blood sugar up to 3 times daily. E11.42 100 each 2    albuterol (PROVENTIL) (2.5 MG/3ML) 0.083% nebulizer solution USE 3 MILLILITERS IN NEBULIZER EVERY 6 HOURS AS NEEDED FOR WHEEZING FOR SHORTNESS OF BREATH 75 mL 0   albuterol (VENTOLIN HFA) 108 (90 Base) MCG/ACT inhaler Inhale 2 puffs into the lungs every 6 (six) hours as needed for wheezing or shortness of breath. 18 g 6   aspirin 81 MG chewable tablet Chew 1 tablet (81 mg total) by mouth daily. 30 tablet 3   blood glucose meter kit and supplies Dispense based on patient and insurance preference. Use up to four times daily as directed. (FOR ICD-9 250.00, 250.01). 1 each 0   Blood Glucose Monitoring Suppl (ACCU-CHEK GUIDE ME) w/Device KIT 1 kit by Does not apply route 3 (three) times daily. Use to check blood sugar up to 3 times daily. 1 kit 0   carvedilol (COREG) 12.5 MG tablet TAKE 1 TABLET BY MOUTH TWICE DAILY WITH A MEAL 180 tablet 1   DULoxetine (CYMBALTA) 20 MG capsule Take 1 capsule (20 mg total) by mouth daily. 30 capsule 5   furosemide (LASIX) 40 MG tablet TAKE 1 TABLET BY MOUTH ONCE DAILY FOR FLUID/HEART FAILURE 90 tablet 2   glucose blood (ACCU-CHEK GUIDE) test strip Use as instructed to check blood sugar up to 3 times daily. 100 each 11   hydrALAZINE (APRESOLINE) 50 MG tablet TAKE 1 TABLET BY MOUTH THREE TIMES DAILY 270 tablet 0   isosorbide mononitrate (IMDUR) 30 MG 24 hr tablet Take 1 tablet by mouth once daily 90 tablet 0   LANTUS SOLOSTAR 100 UNIT/ML Solostar Pen Inject 38 Units into the skin daily. 45 mL 0   liraglutide (VICTOZA) 18 MG/3ML SOPN START WITH INJECTING 0.6MG SUBCUTANEOUSLY ONCE A D AY FOR 7 DAYS, THEN INCREASE TO 1.2MG DAILY FOR 7 DAYS, THEN 1.8MG DAILY THEREAFTER 6 mL 11   loratadine (CLARITIN) 10 MG tablet Take 1 tablet (10 mg total) by mouth daily. 30 tablet 0   metFORMIN (GLUCOPHAGE-XR) 500 MG 24 hr tablet Take 1 tablet (500 mg total) by mouth daily with breakfast. OFFICE VISIT NEEDED FOR ADDITIONAL REFILLS 90 tablet 0   rosuvastatin (CRESTOR) 40 MG tablet Take 1 tablet  (40 mg total) by mouth daily. 90 tablet 3   spironolactone (ALDACTONE) 25 MG tablet Take 1 tablet by mouth once daily 90 tablet 1   TRUEPLUS PEN NEEDLES 31G X 6 MM MISC USE AS DIRECTED 100 each 1   No current facility-administered medications on file prior to visit.    Allergies  Allergen Reactions   Lisinopril Hives and Swelling    Facial    Sertraline Hives and Swelling  Facial    Tramadol Hives and Swelling    facial   Ibuprofen Hives and Swelling    Social History   Socioeconomic History   Marital status: Divorced    Spouse name: Not on file   Number of children: 2   Years of education: Not on file   Highest education level: Not on file  Occupational History   Not on file  Tobacco Use   Smoking status: Never   Smokeless tobacco: Never  Vaping Use   Vaping Use: Never used  Substance and Sexual Activity   Alcohol use: No   Drug use: No   Sexual activity: Yes    Birth control/protection: None  Other Topics Concern   Not on file  Social History Narrative   Lives in [place].  Presently unemployed but attends school full time   Social Determinants of Radio broadcast assistant Strain: Not on file  Food Insecurity: Not on file  Transportation Needs: Not on file  Physical Activity: Not on file  Stress: Not on file  Social Connections: Not on file  Intimate Partner Violence: Not on file    Family History  Problem Relation Age of Onset   Diabetes Other        multiple   Heart failure Paternal Grandmother    Breast cancer Paternal Grandmother    Heart disease Other        multiple   Breast cancer Mother     Past Surgical History:  Procedure Laterality Date   CERVIX LESION DESTRUCTION     DILATION AND CURETTAGE OF UTERUS     LEFT HEART CATHETERIZATION WITH CORONARY ANGIOGRAM N/A 05/21/2011   Procedure: LEFT HEART CATHETERIZATION WITH CORONARY ANGIOGRAM;  Surgeon: Sherren Mocha, MD;  Location: [facility] CATH LAB;  Service: Cardiovascular;  Laterality: N/A;    TUBAL LIGATION  1996    ROS: Review of Systems Negative except as stated above  PHYSICAL EXAM: BP (!) 144/82   Pulse 93   Resp 16   Wt 204 lb 12.8 oz (92.9 kg)   SpO2 100%   BMI 30.24 kg/m   Physical Exam BP 151/83 General appearance - alert, well appearing, and in no distress Mental status -patient seems frustrated and stressed. Neck - supple, no significant adenopathy Chest - clear to auscultation, no wheezes, rales or rhonchi, symmetric air entry Heart - normal rate, regular rhythm, normal S1, S2, no murmurs, rubs, clicks or gallops Extremities - peripheral pulses normal, no pedal edema, no clubbing or cyanosis   Depression screen [facility] 2/9 06/23/2020 01/28/2020 10/29/2019  Decreased Interest 0 0 0  Down, Depressed, Hopeless 0 0 0  PHQ - 2 Score 0 0 0  Altered sleeping - - -  Tired, decreased energy - - -  Change in appetite - - -  Feeling bad or failure about yourself  - - -  Trouble concentrating - - -  Moving slowly or fidgety/restless - - -  Suicidal thoughts - - -  PHQ-9 Score - - -  Difficult doing work/chores - - -  Some recent data might be hidden    CMP Latest Ref Rng & Units 06/15/2020 02/09/2020 06/22/2019  Glucose 65 - 99 mg/dL - 144(H) 230(H)  BUN 6 - 24 mg/dL - 17 15  Creatinine 0.57 - 1.00 mg/dL - 1.07(H) 1.07(H)  Sodium 134 - 144 mmol/L - 139 136  Potassium 3.5 - 5.2 mmol/L - 4.2 4.5  Chloride 96 - 106 mmol/L - 101 101  CO2  20 - 29 mmol/L - 25 23  Calcium 8.7 - 10.2 mg/dL - 9.8 9.3  Total Protein 6.0 - 8.5 g/dL 6.6 6.8 6.5  Total Bilirubin 0.0 - 1.2 mg/dL 0.7 0.5 0.7  Alkaline Phos 44 - 121 IU/L 78 82 80  AST 0 - 40 IU/L 11 9 8   ALT 0 - 32 IU/L 6 8 6    Lipid Panel     Component Value Date/Time   CHOL 223 (H) 06/15/2020 0809   TRIG 96 06/15/2020 0809   HDL 44 06/15/2020 0809   CHOLHDL 5.1 (H) 06/15/2020 0809   CHOLHDL 6.4 (H) 05/05/2015 1645   VLDL 37 (H) 05/05/2015 1645   LDLCALC 162 (H) 06/15/2020 0809    CBC    Component Value  Date/Time   WBC 7.4 10/22/2018 0923   WBC 8.9 07/25/2017 0837   RBC 5.06 10/22/2018 0923   RBC 3.71 (L) 07/25/2017 0837   HGB 14.5 10/22/2018 0923   HCT 44.6 10/22/2018 0923   PLT 337 10/22/2018 0923   MCV 88 10/22/2018 0923   MCH 28.7 10/22/2018 0923   MCH 26.4 07/25/2017 0837   MCHC 32.5 10/22/2018 0923   MCHC 30.6 07/25/2017 0837   RDW 13.7 10/22/2018 0923   LYMPHSABS 2.5 07/31/2017 1225   MONOABS 0.6 06/02/2017 0530   EOSABS 0.2 07/31/2017 1225   BASOSABS 0.1 07/31/2017 1225    ASSESSMENT AND PLAN: 1. Type 2 diabetes mellitus with peripheral neuropathy (HCC) A1c slightly above goal.  Discussed and encourage healthy eating habits.  We discussed increasing metformin to twice a day but patient does not wish to do that because increased dose or increased frequency of metformin causes diarrhea for her.  She feels she would be able to get her A1c back down when she is able to take better care of herself with reduced work hours. - POCT glucose (manual entry) - POCT glycosylated hemoglobin (Hb A1C)  2. Essential hypertension Not at goal.  She has been out of isosorbide for 1 week.  She will pick that up today.  Continue other medications listed above.  3. Anxiety and depression Associated with increased work hours and work stress.  Patient does not feel she needs to be on any medication at this time.  She feels the situation will be corrected if she is able to reduce her work hours.  I think it is reasonable given her chronic conditions to request that she be allowed to work no more than 40 hours a week.  I also included in the letter that she is on a diuretic and would you need to use the restroom more often  4. Chronic combined systolic and diastolic CHF, NYHA class 1 (HCC) Compensated.  Continue current medications and low-salt diet.  5. Need for immunization against influenza - Flu Vaccine QUAD 104moIM (Fluarix, Fluzone & Alfiuria Quad PF)  6. Need for vaccination against  Streptococcus pneumoniae Given PCV 15 today.    Patient was given the opportunity to ask questions.  Patient verbalized understanding of the plan and was able to repeat key elements of the plan.   Orders Placed This Encounter  Procedures   Flu Vaccine QUAD 6107moM (Fluarix, Fluzone & Alfiuria Quad PF)   POCT glucose (manual entry)   POCT glycosylated hemoglobin (Hb A1C)     Requested Prescriptions    No prescriptions requested or ordered in this encounter    Return in about 2 months (around 03/20/2021).  DeKarle PlumberMD, FACP

## 2021-02-12 DIAGNOSIS — Z419 Encounter for procedure for purposes other than remedying health state, unspecified: Secondary | ICD-10-CM | POA: Diagnosis not present

## 2021-02-16 ENCOUNTER — Ambulatory Visit: Payer: Self-pay | Admitting: *Deleted

## 2021-02-16 ENCOUNTER — Ambulatory Visit: Payer: Medicaid Other | Admitting: Physician Assistant

## 2021-02-16 ENCOUNTER — Other Ambulatory Visit: Payer: Self-pay

## 2021-02-16 DIAGNOSIS — J0100 Acute maxillary sinusitis, unspecified: Secondary | ICD-10-CM

## 2021-02-16 DIAGNOSIS — R051 Acute cough: Secondary | ICD-10-CM

## 2021-02-16 MED ORDER — AMOXICILLIN-POT CLAVULANATE 875-125 MG PO TABS: 1.0000 | ORAL_TABLET | Freq: Two times a day (BID) | ORAL | 0 refills | Status: DC

## 2021-02-16 MED ORDER — BENZONATATE 200 MG PO CAPS: 200.0000 mg | ORAL_CAPSULE | Freq: Two times a day (BID) | ORAL | 0 refills | Status: DC | PRN

## 2021-02-16 NOTE — Progress Notes (Signed)
Established Patient Office Visit  Subjective:  Patient ID: [name], female    DOB: [birth year]  Age: 56 y.o. MRN: [id number]  CC:  Chief Complaint  Patient presents with   URI   Virtual Visit via Telephone Note  I connected with [name] on 02/16/21 at 10:30 AM EST by telephone and verified that I am speaking with the correct person using two identifiers.  Location: Patient: Home / Car  Provider: [facility] Medicine Unit    I discussed the limitations, risks, security and privacy concerns of performing an evaluation and management service by telephone and the availability of in person appointments. I also discussed with the patient that there may be a patient responsible charge related to this service. The patient expressed understanding and agreed to proceed.   History of Present Illness: States that she started having cold symptoms on Monday 02/13/21, states that it became worse on Tuesday, states that she did have to leave work.  Reports that she did present to work the next day but had to leave as well.  Reports that she has been having a productive cough with clear sputum, states that it has had blood tinges to it several times.  Reports that her cough is worse at night, is keeping her awake.  Reports that she has been having some shortness of breath while coughing, will use her rescue inhaler with some relief.  Reports that she is also been having tenderness in her gums and teeth which started at the same time.  States it is making it a little bit more difficult for her to eat, is able to eat soft foods and soup.  Reports that she is staying well-hydrated.  States that she has been using Alka-Seltzer cold medication and Tylenol with minimal relief.  States that she was tested twice at work for [employer] with negative results and had negative strep test as well.  Endorses sick contacts at work.   Observations/Objective: Medical history and current medications  reviewed, no physical exam completed  Past Medical History:  Diagnosis Date   Asthma 05/31/2017   Diabetes mellitus, type 2 ([place])    A1C 7.6 April '13   Hypertension    Left leg pain 07/21/2015   Nonischemic cardiomyopathy ([place])    Echo 05/19/11 EF 35% w/ mild-mod MR; R/L Heart Cath 05/21/11 - mildly elevated right heart pressures, normal left heart pressures, preserved cardiac output, widely patent coronary arteries without significant CAD and moderate global systolic LV dysfunction, EF 35-40%.   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 sided weakness, now resolved   Systolic CHF ([place])    Echo 05/19/11 EF 35% w/ mild-mod MR    Past Surgical History:  Procedure Laterality Date   CERVIX LESION DESTRUCTION     DILATION AND CURETTAGE OF UTERUS     LEFT HEART CATHETERIZATION WITH CORONARY ANGIOGRAM N/A 05/21/2011   Procedure: LEFT HEART CATHETERIZATION WITH CORONARY ANGIOGRAM;  Surgeon: Sherren Mocha, MD;  Location: [facility] CATH LAB;  Service: Cardiovascular;  Laterality: N/A;   TUBAL LIGATION  1996    Family History  Problem Relation Age of Onset   Diabetes Other        multiple   Heart failure Paternal Grandmother    Breast cancer Paternal Grandmother    Heart disease Other        multiple   Breast cancer Mother     Social History   Socioeconomic History   Marital status: Divorced    Spouse  name: Not on file   Number of children: 2   Years of education: Not on file   Highest education level: Not on file  Occupational History   Not on file  Tobacco Use   Smoking status: Never   Smokeless tobacco: Never  Vaping Use   Vaping Use: Never used  Substance and Sexual Activity   Alcohol use: No   Drug use: No   Sexual activity: Yes    Birth control/protection: None  Other Topics Concern   Not on file  Social History Narrative   Lives in [place].  Presently unemployed but attends school full time   Social Determinants of Radio broadcast assistant Strain: Not on file  Food  Insecurity: Not on file  Transportation Needs: Not on file  Physical Activity: Not on file  Stress: Not on file  Social Connections: Not on file  Intimate Partner Violence: Not on file    Outpatient Medications Prior to Visit  Medication Sig Dispense Refill   Accu-Chek Softclix Lancets lancets Use to check blood sugar up to 3 times daily. E11.42 100 each 2   albuterol (PROVENTIL) (2.5 MG/3ML) 0.083% nebulizer solution USE 3 MILLILITERS IN NEBULIZER EVERY 6 HOURS AS NEEDED FOR WHEEZING FOR SHORTNESS OF BREATH 75 mL 0   albuterol (VENTOLIN HFA) 108 (90 Base) MCG/ACT inhaler Inhale 2 puffs into the lungs every 6 (six) hours as needed for wheezing or shortness of breath. 18 g 6   aspirin 81 MG chewable tablet Chew 1 tablet (81 mg total) by mouth daily. 30 tablet 3   blood glucose meter kit and supplies Dispense based on patient and insurance preference. Use up to four times daily as directed. (FOR ICD-9 250.00, 250.01). 1 each 0   Blood Glucose Monitoring Suppl (ACCU-CHEK GUIDE ME) w/Device KIT 1 kit by Does not apply route 3 (three) times daily. Use to check blood sugar up to 3 times daily. 1 kit 0   carvedilol (COREG) 12.5 MG tablet TAKE 1 TABLET BY MOUTH TWICE DAILY WITH A MEAL 180 tablet 1   DULoxetine (CYMBALTA) 20 MG capsule Take 1 capsule (20 mg total) by mouth daily. 30 capsule 5   furosemide (LASIX) 40 MG tablet TAKE 1 TABLET BY MOUTH ONCE DAILY FOR FLUID/HEART FAILURE 90 tablet 2   glucose blood (ACCU-CHEK GUIDE) test strip Use as instructed to check blood sugar up to 3 times daily. 100 each 11   hydrALAZINE (APRESOLINE) 50 MG tablet TAKE 1 TABLET BY MOUTH THREE TIMES DAILY 270 tablet 0   isosorbide mononitrate (IMDUR) 30 MG 24 hr tablet Take 1 tablet by mouth once daily 90 tablet 0   LANTUS SOLOSTAR 100 UNIT/ML Solostar Pen Inject 38 Units into the skin daily. 45 mL 0   liraglutide (VICTOZA) 18 MG/3ML SOPN START WITH INJECTING 0.6MG SUBCUTANEOUSLY ONCE A D AY FOR 7 DAYS, THEN INCREASE  TO 1.2MG DAILY FOR 7 DAYS, THEN 1.8MG DAILY THEREAFTER 6 mL 11   loratadine (CLARITIN) 10 MG tablet Take 1 tablet (10 mg total) by mouth daily. 30 tablet 0   metFORMIN (GLUCOPHAGE-XR) 500 MG 24 hr tablet Take 1 tablet (500 mg total) by mouth daily with breakfast. OFFICE VISIT NEEDED FOR ADDITIONAL REFILLS 90 tablet 0   rosuvastatin (CRESTOR) 40 MG tablet Take 1 tablet (40 mg total) by mouth daily. 90 tablet 3   spironolactone (ALDACTONE) 25 MG tablet Take 1 tablet by mouth once daily 90 tablet 1   TRUEPLUS PEN NEEDLES 31G X 6  MM MISC USE AS DIRECTED 100 each 1   No facility-administered medications prior to visit.    Allergies  Allergen Reactions   Lisinopril Hives and Swelling    Facial    Sertraline Hives and Swelling    Facial    Tramadol Hives and Swelling    facial   Ibuprofen Hives and Swelling    ROS Review of Systems  Constitutional:  Negative for chills, fatigue and fever.  HENT:  Positive for congestion, sinus pressure and sneezing. Negative for sore throat and trouble swallowing.   Eyes: Negative.   Respiratory:  Positive for cough and shortness of breath. Negative for wheezing.   Cardiovascular:  Negative for chest pain.  Gastrointestinal:  Positive for diarrhea. Negative for abdominal pain, nausea and vomiting.  Endocrine: Negative.   Genitourinary: Negative.   Musculoskeletal:  Negative for myalgias.  Skin: Negative.   Allergic/Immunologic: Negative.   Neurological: Negative.   Hematological: Negative.   Psychiatric/Behavioral: Negative.       Objective:      There were no vitals taken for this visit. Wt Readings from Last 3 Encounters:  01/17/21 204 lb 12.8 oz (92.9 kg)  06/23/20 199 lb (90.3 kg)  01/28/20 202 lb 3.2 oz (91.7 kg)     Health Maintenance Due  Topic Date Due   Hepatitis C Screening  Never done   COLONOSCOPY (Pts 45-17yr Insurance coverage will need to be confirmed)  Never done   Zoster Vaccines- Shingrix (1 of 2) Never done    COVID-19 Vaccine (4 - Booster for Pfizer series) 04/14/2020   FOOT EXAM  07/27/2020    There are no preventive care reminders to display for this patient.  Lab Results  Component Value Date   TSH 1.490 10/30/2019   Lab Results  Component Value Date   WBC 7.4 10/22/2018   HGB 14.5 10/22/2018   HCT 44.6 10/22/2018   MCV 88 10/22/2018   PLT 337 10/22/2018   Lab Results  Component Value Date   NA 139 02/09/2020   K 4.2 02/09/2020   CO2 25 02/09/2020   GLUCOSE 144 (H) 02/09/2020   BUN 17 02/09/2020   CREATININE 1.07 (H) 02/09/2020   BILITOT 0.7 06/15/2020   ALKPHOS 78 06/15/2020   AST 11 06/15/2020   ALT 6 06/15/2020   PROT 6.6 06/15/2020   ALBUMIN 4.2 06/15/2020   CALCIUM 9.8 02/09/2020   ANIONGAP 9 07/26/2017   GFR 83.74 10/30/2012   Lab Results  Component Value Date   CHOL 223 (H) 06/15/2020   Lab Results  Component Value Date   HDL 44 06/15/2020   Lab Results  Component Value Date   LDLCALC 162 (H) 06/15/2020   Lab Results  Component Value Date   TRIG 96 06/15/2020   Lab Results  Component Value Date   CHOLHDL 5.1 (H) 06/15/2020   Lab Results  Component Value Date   HGBA1C 7.5 (A) 01/17/2021      Assessment & Plan:   Problem List Items Addressed This Visit   None Visit Diagnoses     Acute non-recurrent maxillary sinusitis    -  Primary   Relevant Medications   benzonatate (TESSALON) 200 MG capsule   amoxicillin-clavulanate (AUGMENTIN) 875-125 MG tablet   Acute cough       Relevant Medications   benzonatate (TESSALON) 200 MG capsule       Meds ordered this encounter  Medications   benzonatate (TESSALON) 200 MG capsule    Sig: Take 1 capsule (  200 mg total) by mouth 2 (two) times daily as needed for cough.    Dispense:  20 capsule    Refill:  0    Order Specific Question:   Supervising Provider    Answer:   Elsie Stain [[id number]]   amoxicillin-clavulanate (AUGMENTIN) 875-125 MG tablet    Sig: Take 1 tablet by mouth 2 (two) times  daily.    Dispense:  20 tablet    Refill:  0    Order Specific Question:   Supervising Provider    Answer:   Asencion Noble E [[id number]]     Assessment and Plan:  1. Acute non-recurrent maxillary sinusitis Trial Augmentin, patient education given on supportive care.  Red flags for prompt reevaluation - amoxicillin-clavulanate (AUGMENTIN) 875-125 MG tablet; Take 1 tablet by mouth 2 (two) times daily.  Dispense: 20 tablet; Refill: 0  2. Acute cough Trial Tessalon - benzonatate (TESSALON) 200 MG capsule; Take 1 capsule (200 mg total) by mouth 2 (two) times daily as needed for cough.  Dispense: 20 capsule; Refill: 0  Follow Up Instructions:    I discussed the assessment and treatment plan with the patient. The patient was provided an opportunity to ask questions and all were answered. The patient agreed with the plan and demonstrated an understanding of the instructions.   The patient was advised to call back or seek an in-person evaluation if the symptoms worsen or if the condition fails to improve as anticipated.  I provided 21 minutes of non-face-to-face time during this encounter.   Follow-up: Return if symptoms worsen or fail to improve.    Loraine Grip Mayers, PA-C

## 2021-02-16 NOTE — Patient Instructions (Signed)
Due to your complaint of gum and teeth pain, I am changing your antibiotic to Augmentin.  This may be more related to a sinus infection rather than an upper respiratory infection.  Regardless, you will use Tessalon Perles twice a day as needed to help with your cough, make sure you are getting plenty of rest and drinking plenty of fluids.  Kennieth Rad, PA-C Physician Assistant [facility] Medicine [email or web address]   Sinusitis, Adult Sinusitis is inflammation of your sinuses. Sinuses are hollow spaces in the bones around your face. Your sinuses are located: Around your eyes. In the middle of your forehead. Behind your nose. In your cheekbones. Mucus normally drains out of your sinuses. When your nasal tissues become inflamed or swollen, mucus can become trapped or blocked. This allows bacteria, viruses, and fungi to grow, which leads to infection. Most infections of the sinuses are caused by a virus. Sinusitis can develop quickly. It can last for up to 4 weeks (acute) or for more than 12 weeks (chronic). Sinusitis often develops after a cold. What are the causes? This condition is caused by anything that creates swelling in the sinuses or stops mucus from draining. This includes: Allergies. Asthma. Infection from bacteria or viruses. Deformities or blockages in your nose or sinuses. Abnormal growths in the nose (nasal polyps). Pollutants, such as chemicals or irritants in the air. Infection from fungi (rare). What increases the risk? You are more likely to develop this condition if you: Have a weak body defense system (immune system). Do a lot of swimming or diving. Overuse nasal sprays. Smoke. What are the signs or symptoms? The main symptoms of this condition are pain and a feeling of pressure around the affected sinuses. Other symptoms include: Stuffy nose or congestion. Thick drainage from your nose. Swelling and warmth over  the affected sinuses. Headache. Upper toothache. A cough that may get worse at night. Extra mucus that collects in the throat or the back of the nose (postnasal drip). Decreased sense of smell and taste. Fatigue. A fever. Sore throat. Bad breath. How is this diagnosed? This condition is diagnosed based on: Your symptoms. Your medical history. A physical exam. Tests to find out if your condition is acute or chronic. This may include: Checking your nose for nasal polyps. Viewing your sinuses using a device that has a light (endoscope). Testing for allergies or bacteria. Imaging tests, such as an MRI or CT scan. In rare cases, a bone biopsy may be done to rule out more serious types of fungal sinus disease. How is this treated? Treatment for sinusitis depends on the cause and whether your condition is chronic or acute. If caused by a virus, your symptoms should go away on their own within 10 days. You may be given medicines to relieve symptoms. They include: Medicines that shrink swollen nasal passages (topical intranasal decongestants). Medicines that treat allergies (antihistamines). A spray that eases inflammation of the nostrils (topical intranasal corticosteroids). Rinses that help get rid of thick mucus in your nose (nasal saline washes). If caused by bacteria, your health care provider may recommend waiting to see if your symptoms improve. Most bacterial infections will get better without antibiotic medicine. You may be given antibiotics if you have: A severe infection. A weak immune system. If caused by narrow nasal passages or nasal polyps, you may need to have surgery. Follow these instructions at home: Medicines Take, use, or apply over-the-counter and prescription medicines only as told by your health care provider. These  may include nasal sprays. If you were prescribed an antibiotic medicine, take it as told by your health care provider. Do not stop taking the antibiotic  even if you start to feel better. Hydrate and humidify  Drink enough fluid to keep your urine pale yellow. Staying hydrated will help to thin your mucus. Use a cool mist humidifier to keep the humidity level in your home above 50%. Inhale steam for 10-15 minutes, 3-4 times a day, or as told by your health care provider. You can do this in the bathroom while a hot shower is running. Limit your exposure to cool or dry air. Rest Rest as much as possible. Sleep with your head raised (elevated). Make sure you get enough sleep each night. General instructions  Apply a warm, moist washcloth to your face 3-4 times a day or as told by your health care provider. This will help with discomfort. Wash your hands often with soap and water to reduce your exposure to germs. If soap and water are not available, use hand sanitizer. Do not smoke. Avoid being around people who are smoking (secondhand smoke). Keep all follow-up visits as told by your health care provider. This is important. Contact a health care provider if: You have a fever. Your symptoms get worse. Your symptoms do not improve within 10 days. Get help right away if: You have a severe headache. You have persistent vomiting. You have severe pain or swelling around your face or eyes. You have vision problems. You develop confusion. Your neck is stiff. You have trouble breathing. Summary Sinusitis is soreness and inflammation of your sinuses. Sinuses are hollow spaces in the bones around your face. This condition is caused by nasal tissues that become inflamed or swollen. The swelling traps or blocks the flow of mucus. This allows bacteria, viruses, and fungi to grow, which leads to infection. If you were prescribed an antibiotic medicine, take it as told by your health care provider. Do not stop taking the antibiotic even if you start to feel better. Keep all follow-up visits as told by your health care provider. This is important. This  information is not intended to replace advice given to you by your health care provider. Make sure you discuss any questions you have with your health care provider. Document Revised: 07/01/2017 Document Reviewed: 07/01/2017 Elsevier Patient Education  2022 [employer].

## 2021-02-16 NOTE — Telephone Encounter (Signed)
°  Chief Complaint: Cough, In general just not feeling well. Left work Tuesday and again today. Symptoms: productive cough, stated it was clear phlegm now but earlier this week is contained blood and she was not straining. Frequency: Started Monday Pertinent Negatives: Patient denies fever/CP/SOB Disposition: [] ED /[] Urgent Care (no appt availability in office) / [] Appointment(In office/virtual)/ []  [place] [facility]/ [] Home Care/ [] Refused Recommended Disposition /[x] [place] [facility]/ []  Follow-up with PCP Additional Notes: Patient had negative Covid results at work this morning.  Had SOB Tuesday and again last night but none today. No OV availability-she will go to mobile unit now.    Answer Assessment - Initial Assessment Questions 1. RESPIRATORY STATUS: "Describe your breathing?" (e.g., wheezing, shortness of breath, unable to speak, severe coughing)      None today. SOB last night and Tuesday. 2. ONSET: "When did this breathing problem begin?"      Tuesday 3. PATTERN "Does the difficult breathing come and go, or has it been constant since it started?"      Comes and goes 4. SEVERITY: "How bad is your breathing?" (e.g., mild, moderate, severe)    - MILD: No SOB at rest, mild SOB with walking, speaks normally in sentences, can lie down, no retractions, pulse < 100.    - MODERATE: SOB at rest, SOB with minimal exertion and prefers to sit, cannot lie down flat, speaks in phrases, mild retractions, audible wheezing, pulse 100-120.    - SEVERE: Very SOB at rest, speaks in single words, struggling to breathe, sitting hunched forward, retractions, pulse > 120      Okay today 5. RECURRENT SYMPTOM: "Have you had difficulty breathing before?" If Yes, ask: "When was the last time?" and "What happened that time?"      no 6. CARDIAC HISTORY: "Do you have any history of heart disease?" (e.g., heart attack, angina, bypass surgery, angioplasty)      CHF 7. LUNG HISTORY: "Do you have any  history of lung disease?"  (e.g., pulmonary embolus, asthma, emphysema)     asthma 8. CAUSE: "What do you think is causing the breathing problem?"      unsure 9. OTHER SYMPTOMS: "Do you have any other symptoms? (e.g., dizziness, runny nose, cough, chest pain, fever)    Runny nose and HA, taking alkaseltzer 10. O2 SATURATION MONITOR:  "Do you use an oxygen saturation monitor (pulse oximeter) at home?" If Yes, "What is your reading (oxygen level) today?" "What is your usual oxygen saturation reading?" (e.g., 95%)       100 11. PREGNANCY: "Is there any chance you are pregnant?" "When was your last menstrual period?"       na 12. TRAVEL: "Have you traveled out of the country in the last month?" (e.g., travel history, exposures)       na  Protocols used: Breathing Difficulty-A-AH

## 2021-02-20 ENCOUNTER — Other Ambulatory Visit: Payer: Self-pay | Admitting: Cardiology

## 2021-03-07 ENCOUNTER — Other Ambulatory Visit: Payer: Self-pay | Admitting: Internal Medicine

## 2021-03-07 NOTE — Telephone Encounter (Signed)
Requested medications are due for refill today.  yes  Requested medications are on the active medications list.  yes  Last refill. 10/31/2020  Future visit scheduled.   yes  Notes to clinic.  Failed protocol d/t expired labs.    Requested Prescriptions  Pending Prescriptions Disp Refills   metFORMIN (GLUCOPHAGE-XR) 500 MG 24 hr tablet [Pharmacy Med Name: metFORMIN HCl ER 500 MG Oral Tablet Extended Release 24 Hour] 90 tablet 0    Sig: TAKE 1 TABLET BY MOUTH ONCE DAILY WITH BREAKFAST . APPOINTMENT REQUIRED FOR FUTURE REFILLS     Endocrinology:  Diabetes - Biguanides Failed - 03/07/2021  5:30 AM      Failed - Cr in normal range and within 360 days    Creat  Date Value Ref Range Status  06/29/2016 0.92 0.50 - 1.10 mg/dL Final   Creatinine, Ser  Date Value Ref Range Status  02/09/2020 1.07 (H) 0.57 - 1.00 mg/dL Final          Failed - AA eGFR in normal range and within 360 days    GFR, Est African American  Date Value Ref Range Status  05/05/2015 80 >=60 mL/min Final   GFR calc Af Amer  Date Value Ref Range Status  02/09/2020 69 >59 mL/min/1.73 Final    Comment:    **In accordance with recommendations from the NKF-ASN Task force,**   [facility] is in the process of updating its eGFR calculation to the   2021 CKD-EPI creatinine equation that estimates kidney function   without a race variable.    GFR, Est Non African American  Date Value Ref Range Status  05/05/2015 70 >=60 mL/min Final    Comment:      The estimated GFR is a calculation valid for adults (>=19 years old) that uses the CKD-EPI algorithm to adjust for age and sex. It is   not to be used for children, pregnant women, hospitalized patients,    patients on dialysis, or with rapidly changing kidney function. According to the NKDEP, eGFR >89 is normal, 60-89 shows mild impairment, 30-59 shows moderate impairment, 15-29 shows severe impairment and <15 is ESRD.      GFR calc non Af Amer  Date Value Ref Range  Status  02/09/2020 60 >59 mL/min/1.73 Final   GFR  Date Value Ref Range Status  10/30/2012 83.74 >60.00 mL/min Final          Passed - HBA1C is between 0 and 7.9 and within 180 days    HbA1c, POC (controlled diabetic range)  Date Value Ref Range Status  01/17/2021 7.5 (A) 0.0 - 7.0 % Final          Passed - Valid encounter within last 6 months    Recent Outpatient Visits           1 month ago Type 2 diabetes mellitus with peripheral neuropathy [facility])   [place] Karle Plumber B, MD   8 months ago Type 2 diabetes mellitus with peripheral neuropathy [facility])   [place] Karle Plumber B, MD   1 year ago Uncontrolled type 2 diabetes mellitus with peripheral neuropathy [facility])   [place] Karle Plumber B, MD   1 year ago Uncontrolled type 2 diabetes mellitus with peripheral neuropathy [facility])   [place], RPH-CPP   1 year ago Uncontrolled type 2 diabetes mellitus with peripheral neuropathy ([place])  [place], RPH-CPP       Future Appointments             In 2 weeks Ladell Pier, MD [place]   In 1 month [place], Denice Bors, MD [facility] [place], [place]

## 2021-03-15 DIAGNOSIS — Z419 Encounter for procedure for purposes other than remedying health state, unspecified: Secondary | ICD-10-CM | POA: Diagnosis not present

## 2021-03-21 ENCOUNTER — Encounter: Payer: Self-pay | Admitting: Internal Medicine

## 2021-03-21 ENCOUNTER — Ambulatory Visit: Payer: Medicaid Other | Attending: Internal Medicine | Admitting: Internal Medicine

## 2021-03-21 VITALS — BP 130/77 | HR 97 | Resp 16 | Wt 201.4 lb

## 2021-03-21 DIAGNOSIS — E1169 Type 2 diabetes mellitus with other specified complication: Secondary | ICD-10-CM

## 2021-03-21 DIAGNOSIS — E785 Hyperlipidemia, unspecified: Secondary | ICD-10-CM | POA: Diagnosis not present

## 2021-03-21 DIAGNOSIS — Z1211 Encounter for screening for malignant neoplasm of colon: Secondary | ICD-10-CM

## 2021-03-21 DIAGNOSIS — E1142 Type 2 diabetes mellitus with diabetic polyneuropathy: Secondary | ICD-10-CM | POA: Diagnosis not present

## 2021-03-21 DIAGNOSIS — E559 Vitamin D deficiency, unspecified: Secondary | ICD-10-CM | POA: Diagnosis not present

## 2021-03-21 DIAGNOSIS — I10 Essential (primary) hypertension: Secondary | ICD-10-CM

## 2021-03-21 DIAGNOSIS — R5383 Other fatigue: Secondary | ICD-10-CM | POA: Diagnosis not present

## 2021-03-21 DIAGNOSIS — I420 Dilated cardiomyopathy: Secondary | ICD-10-CM

## 2021-03-21 LAB — GLUCOSE, POCT (MANUAL RESULT ENTRY): POC Glucose: 116 mg/dl — AB (ref 70–99)

## 2021-03-21 MED ORDER — HYDRALAZINE HCL 50 MG PO TABS: 50.0000 mg | ORAL_TABLET | Freq: Three times a day (TID) | ORAL | 3 refills | Status: DC

## 2021-03-21 MED ORDER — LANTUS SOLOSTAR 100 UNIT/ML ~~LOC~~ SOPN: 38.0000 [IU] | PEN_INJECTOR | Freq: Every day | SUBCUTANEOUS | 3 refills | Status: DC

## 2021-03-21 MED ORDER — DEXCOM G6 TRANSMITTER MISC: 1.0000 | Freq: Every day | 4 refills | Status: DC

## 2021-03-21 MED ORDER — ISOSORBIDE MONONITRATE ER 30 MG PO TB24: 30.0000 mg | ORAL_TABLET | Freq: Every day | ORAL | 3 refills | Status: DC

## 2021-03-21 MED ORDER — METFORMIN HCL ER 500 MG PO TB24: 500.0000 mg | ORAL_TABLET | Freq: Every day | ORAL | 3 refills | Status: DC

## 2021-03-21 MED ORDER — DEXCOM G6 SENSOR MISC: 1.0000 | Freq: Every day | 11 refills | Status: DC

## 2021-03-21 MED ORDER — DEXCOM G6 RECEIVER DEVI: 1.0000 | Freq: Every day | 0 refills | Status: DC

## 2021-03-21 MED ORDER — GABAPENTIN 100 MG PO CAPS: 200.0000 mg | ORAL_CAPSULE | Freq: Every day | ORAL | 3 refills | Status: DC

## 2021-03-21 MED ORDER — SPIRONOLACTONE 25 MG PO TABS: 25.0000 mg | ORAL_TABLET | Freq: Every day | ORAL | 3 refills | Status: DC

## 2021-03-21 MED ORDER — ROSUVASTATIN CALCIUM 40 MG PO TABS: 40.0000 mg | ORAL_TABLET | Freq: Every day | ORAL | 3 refills | Status: DC

## 2021-03-21 NOTE — Progress Notes (Signed)
Patient ID: [name], female    DOB: Jun 25, 1965  MRN: [id number]  CC: Diabetes, Hypertension, Peripheral Neuropathy, and Fatigue   Subjective: [name] is a 56 y.o. female who presents for chronic disease management. Her concerns today include:  DM with neuropathy, HL, HTN, DCM/sys and diastolic CHF with VE93-81% on 08/2019, lung nodules resolved, hx of LT common carotid dissection, CKD 2-3, mild anemia,  vit D def, anxiety/depression, mild intermittent asthma.   C/o being tired more with decrease energy. Others at work have noticed it.  Reducing her work hrs has helped. No longer gets menses. -feels she gets in about 7 hrs at nights and sleeps well.  Feels rested in the mornings but feels tired when she goes to work.  She thinks she snores a little bit but only if she lays flat on her back.  She normally sleeps on her side.  Now working first shift instead of 2nd shift.  She likes the 1st shift but it is more physically demanding.  She wonders whether she needs to be on any vitamins.  She has history of vitamin D deficiency.  Does not feel depressed.  DIABETES TYPE 2/obesity Last A1C:   Results for orders placed or performed in visit on 03/21/21  POCT glucose (manual entry)  Result Value Ref Range   POC Glucose 116 (A) 70 - 99 mg/dl    Lab Results  Component Value Date   HGBA1C 7.5 (A) 01/17/2021    Med Adherence:  [x]  Yes  -Lantus 38 units daily, Victoza and Metformin Medication side effects:  []  Yes    [x]  No Home Monitoring?  []  Yes    [x]  No; bothers her sticking her finger all the time Home glucose results range: Diet Adherence: [x]  Yes  doing better.  Cooking more.  Does a lot of walking at work and walks 2-3x/wk after work   Hypoglycemic episodes?: []  Yes    [x]  No Numbness of the feet? [x]  Yes  and tingling in feet RT>LT.  Sometimes not able to drive.  Goes and comes.   Retinopathy hx? []  Yes    []  No Last eye exam:  Comments:   HYPERTENSION/CHF with  dilated cardiomyopathy Currently taking: see medication list.  She does not have her medicines with her but endorses taking carvedilol, furosemide, spironolactone, isosorbide and hydralazine.  She has an appointment with her cardiologist Dr. Stanford Breed next month. Med Adherence: [x]  Yes    []  No Medication side effects: []  Yes    [x]  No Adherence with salt restriction: [x]  Yes    []  No Home Monitoring?: []  Yes    [x]  No Monitoring Frequency:  Home BP results range:  SOB? []  Yes    [x]  No Chest Pain?: []  Yes    [x]  No Leg swelling?: []  Yes    [x]  No Headaches?: []  Yes    [x]  No Dizziness? []  Yes    [x]  No Comments:   HL:  thinks she is out of Crestor  HM:  due for colon CA screen.  We never received results of cologuard Patient Active Problem List   Diagnosis Date Noted   Anxiety and depression 01/17/2021   Sinus tachycardia 10/15/2018   Vitamin D deficiency 03/26/2018   Homeless 03/24/2018   Microalbuminuria 08/11/2017   Anemia 08/08/2017   Physical deconditioning    Hyperlipidemia 02/21/2017   Diabetic polyneuropathy associated with type 2 diabetes mellitus ([place]) 02/21/2017   Lung nodules 07/16/2016   Uncontrolled type 2 diabetes  mellitus without complication, with long-term current use of insulin 07/16/2016   DCM (dilated cardiomyopathy) (HCC)    Acute on chronic combined systolic and diastolic CHF (congestive heart failure) ([place]) 06/10/2016   Essential hypertension 06/10/2016   Dyslipidemia associated with type 2 diabetes mellitus ([place]) 06/10/2016     Current Outpatient Medications on File Prior to Visit  Medication Sig Dispense Refill   Accu-Chek Softclix Lancets lancets Use to check blood sugar up to 3 times daily. E11.42 100 each 2   albuterol (PROVENTIL) (2.5 MG/3ML) 0.083% nebulizer solution USE 3 MILLILITERS IN NEBULIZER EVERY 6 HOURS AS NEEDED FOR WHEEZING FOR SHORTNESS OF BREATH 75 mL 0   albuterol (VENTOLIN HFA) 108 (90 Base) MCG/ACT inhaler Inhale 2 puffs into the  lungs every 6 (six) hours as needed for wheezing or shortness of breath. 18 g 6   aspirin 81 MG chewable tablet Chew 1 tablet (81 mg total) by mouth daily. 30 tablet 3   blood glucose meter kit and supplies Dispense based on patient and insurance preference. Use up to four times daily as directed. (FOR ICD-9 250.00, 250.01). 1 each 0   Blood Glucose Monitoring Suppl (ACCU-CHEK GUIDE ME) w/Device KIT 1 kit by Does not apply route 3 (three) times daily. Use to check blood sugar up to 3 times daily. 1 kit 0   carvedilol (COREG) 12.5 MG tablet TAKE 1 TABLET BY MOUTH TWICE DAILY WITH A MEAL 180 tablet 1   DULoxetine (CYMBALTA) 20 MG capsule Take 1 capsule (20 mg total) by mouth daily. 30 capsule 5   furosemide (LASIX) 40 MG tablet TAKE 1 TABLET BY MOUTH ONCE DAILY FOR FLUID/HEART FAILURE 90 tablet 2   glucose blood (ACCU-CHEK GUIDE) test strip Use as instructed to check blood sugar up to 3 times daily. 100 each 11   liraglutide (VICTOZA) 18 MG/3ML SOPN START WITH INJECTING 0.6MG SUBCUTANEOUSLY ONCE A D AY FOR 7 DAYS, THEN INCREASE TO 1.2MG DAILY FOR 7 DAYS, THEN 1.8MG DAILY THEREAFTER 6 mL 11   loratadine (CLARITIN) 10 MG tablet Take 1 tablet (10 mg total) by mouth daily. 30 tablet 0   TRUEPLUS PEN NEEDLES 31G X 6 MM MISC USE AS DIRECTED 100 each 1   No current facility-administered medications on file prior to visit.    Allergies  Allergen Reactions   Lisinopril Hives and Swelling    Facial    Sertraline Hives and Swelling    Facial    Tramadol Hives and Swelling    facial   Ibuprofen Hives and Swelling    Social History   Socioeconomic History   Marital status: Divorced    Spouse name: Not on file   Number of children: 2   Years of education: Not on file   Highest education level: Not on file  Occupational History   Not on file  Tobacco Use   Smoking status: Never   Smokeless tobacco: Never  Vaping Use   Vaping Use: Never used  Substance and Sexual Activity   Alcohol use: No    Drug use: No   Sexual activity: Yes    Birth control/protection: None  Other Topics Concern   Not on file  Social History Narrative   Lives in [place].  Presently unemployed but attends school full time   Social Determinants of Health   Financial Resource Strain: Not on file  Food Insecurity: Not on file  Transportation Needs: Not on file  Physical Activity: Not on file  Stress: Not on file  Social Connections: Not on file  Intimate Partner Violence: Not on file    Family History  Problem Relation Age of Onset   Diabetes Other        multiple   Heart failure Paternal Grandmother    Breast cancer Paternal Grandmother    Heart disease Other        multiple   Breast cancer Mother     Past Surgical History:  Procedure Laterality Date   CERVIX LESION DESTRUCTION     DILATION AND CURETTAGE OF UTERUS     LEFT HEART CATHETERIZATION WITH CORONARY ANGIOGRAM N/A 05/21/2011   Procedure: LEFT HEART CATHETERIZATION WITH CORONARY ANGIOGRAM;  Surgeon: Sherren Mocha, MD;  Location: [facility] CATH LAB;  Service: Cardiovascular;  Laterality: N/A;   TUBAL LIGATION  1996    ROS: Review of Systems Negative except as stated above  PHYSICAL EXAM: BP 130/77    Pulse 97    Resp 16    Wt 201 lb 6.4 oz (91.4 kg)    SpO2 98%    BMI 29.74 kg/m   Wt Readings from Last 3 Encounters:  03/21/21 201 lb 6.4 oz (91.4 kg)  01/17/21 204 lb 12.8 oz (92.9 kg)  06/23/20 199 lb (90.3 kg)    Physical Exam  General appearance - alert, well appearing, and in no distress Mental status - normal mood, behavior, speech, dress, motor activity, and thought processes Neck - supple, no significant adenopathy Chest - clear to auscultation, no wheezes, rales or rhonchi, symmetric air entry Heart - normal rate, regular rhythm, normal S1, S2, no murmurs, rubs, clicks or gallops Extremities - peripheral pulses normal, no pedal edema, no clubbing or cyanosis Diabetic Foot Exam - Simple   Simple Foot Form Diabetic Foot  exam was performed with the following findings: Yes 03/21/2021  4:52 PM  Visual Inspection No deformities, no ulcerations, no other skin breakdown bilaterally: Yes Sensation Testing Intact to touch and monofilament testing bilaterally: Yes Pulse Check Posterior Tibialis and Dorsalis pulse intact bilaterally: Yes Comments      Depression screen [facility] 2/9 03/21/2021 06/23/2020 01/28/2020  Decreased Interest 0 0 0  Down, Depressed, Hopeless 0 0 0  PHQ - 2 Score 0 0 0  Altered sleeping - - -  Tired, decreased energy - - -  Change in appetite - - -  Feeling bad or failure about yourself  - - -  Trouble concentrating - - -  Moving slowly or fidgety/restless - - -  Suicidal thoughts - - -  PHQ-9 Score - - -  Difficult doing work/chores - - -  Some recent data might be hidden    CMP Latest Ref Rng & Units 06/15/2020 02/09/2020 06/22/2019  Glucose 65 - 99 mg/dL - 144(H) 230(H)  BUN 6 - 24 mg/dL - 17 15  Creatinine 0.57 - 1.00 mg/dL - 1.07(H) 1.07(H)  Sodium 134 - 144 mmol/L - 139 136  Potassium 3.5 - 5.2 mmol/L - 4.2 4.5  Chloride 96 - 106 mmol/L - 101 101  CO2 20 - 29 mmol/L - 25 23  Calcium 8.7 - 10.2 mg/dL - 9.8 9.3  Total Protein 6.0 - 8.5 g/dL 6.6 6.8 6.5  Total Bilirubin 0.0 - 1.2 mg/dL 0.7 0.5 0.7  Alkaline Phos 44 - 121 IU/L 78 82 80  AST 0 - 40 IU/L 11 9 8   ALT 0 - 32 IU/L 6 8 6    Lipid Panel     Component Value Date/Time   CHOL 223 (H) 06/15/2020  0809   TRIG 96 06/15/2020 0809   HDL 44 06/15/2020 0809   CHOLHDL 5.1 (H) 06/15/2020 0809   CHOLHDL 6.4 (H) 05/05/2015 1645   VLDL 37 (H) 05/05/2015 1645   LDLCALC 162 (H) 06/15/2020 0809    CBC    Component Value Date/Time   WBC 7.4 10/22/2018 0923   WBC 8.9 07/25/2017 0837   RBC 5.06 10/22/2018 0923   RBC 3.71 (L) 07/25/2017 0837   HGB 14.5 10/22/2018 0923   HCT 44.6 10/22/2018 0923   PLT 337 10/22/2018 0923   MCV 88 10/22/2018 0923   MCH 28.7 10/22/2018 0923   MCH 26.4 07/25/2017 0837   MCHC 32.5 10/22/2018 0923    MCHC 30.6 07/25/2017 0837   RDW 13.7 10/22/2018 0923   LYMPHSABS 2.5 07/31/2017 1225   MONOABS 0.6 06/02/2017 0530   EOSABS 0.2 07/31/2017 1225   BASOSABS 0.1 07/31/2017 1225    ASSESSMENT AND PLAN: 1. Type 2 diabetes mellitus with peripheral neuropathy (HCC) We discussed trying to get her approved for a Dexcom or libre device.  She is agreeable to this provided is covered by her insurance.  If it is approved, I told patient she can make appointment with our clinical pharmacist to help her set up the device. Encouraged her to continue healthy eating habits and regular exercise. Continue Victoza, Lantus insulin 38 units daily and metformin 500 mg daily. Even though neuropathy symptoms are intermittent, she feels it is bothersome enough that she would like to try medication.  Discussed trying low dose Gabapentin - POCT glucose (manual entry) - CBC - Comprehensive metabolic panel - Vitamin W62 - Continuous Blood Gluc Receiver (DEXCOM G6 RECEIVER) DEVI; 1 Device by Does not apply route daily.  Dispense: 1 each; Refill: 0 - Continuous Blood Gluc Sensor (DEXCOM G6 SENSOR) MISC; 1 packet by Does not apply route daily.  Dispense: 3 each; Refill: 11 - Continuous Blood Gluc Transmit (DEXCOM G6 TRANSMITTER) MISC; 1 packet by Does not apply route daily.  Dispense: 1 each; Refill: 4 - LANTUS SOLOSTAR 100 UNIT/ML Solostar Pen; Inject 38 Units into the skin daily.  Dispense: 45 mL; Refill: 3 - gabapentin (NEURONTIN) 100 MG capsule; Take 2 capsules (200 mg total) by mouth at bedtime. For diabetic neuropathy symptoms  Dispense: 60 capsule; Refill: 3  2. Essential hypertension At goal.  Continue current medications and low-salt diet. - hydrALAZINE (APRESOLINE) 50 MG tablet; Take 1 tablet (50 mg total) by mouth 3 (three) times daily.  Dispense: 270 tablet; Refill: 3  3. Other fatigue Likely due to increased physical demand of her new work shift.  Encouraged her to try to get in at least 8 hours of sleep  at night. - Vitamin B12 - TSH+T4F+T3Free  4. Vitamin D deficiency - VITAMIN D 25 Hydroxy (Vit-D Deficiency, Fractures)  5. DCM (dilated cardiomyopathy) ([place]) Stable and compensated.  Continue current medications including spironolactone, isosorbide, hydralazine, and furosemide.  Keep upcoming appointment with her cardiologist. - isosorbide mononitrate (IMDUR) 30 MG 24 hr tablet; Take 1 tablet (30 mg total) by mouth daily.  Dispense: 90 tablet; Refill: 3  6. Dyslipidemia associated with type 2 diabetes mellitus (HCC) - Lipid panel - rosuvastatin (CRESTOR) 40 MG tablet; Take 1 tablet (40 mg total) by mouth daily.  Dispense: 90 tablet; Refill: 3  7. Screening for colon cancer - Cologuard     Patient was given the opportunity to ask questions.  Patient verbalized understanding of the plan and was able to repeat key elements of  the plan.   Orders Placed This Encounter  Procedures   CBC   Comprehensive metabolic panel   Lipid panel   Vitamin B12   TSH+T4F+T3Free   VITAMIN D 25 Hydroxy (Vit-D Deficiency, Fractures)   Cologuard   POCT glucose (manual entry)     Requested Prescriptions   Signed Prescriptions Disp Refills   Continuous Blood Gluc Receiver (DEXCOM G6 RECEIVER) DEVI 1 each 0    Sig: 1 Device by Does not apply route daily.   Continuous Blood Gluc Sensor (DEXCOM G6 SENSOR) MISC 3 each 11    Sig: 1 packet by Does not apply route daily.   Continuous Blood Gluc Transmit (DEXCOM G6 TRANSMITTER) MISC 1 each 4    Sig: 1 packet by Does not apply route daily.   LANTUS SOLOSTAR 100 UNIT/ML Solostar Pen 45 mL 3    Sig: Inject 38 Units into the skin daily.   hydrALAZINE (APRESOLINE) 50 MG tablet 270 tablet 3    Sig: Take 1 tablet (50 mg total) by mouth 3 (three) times daily.   isosorbide mononitrate (IMDUR) 30 MG 24 hr tablet 90 tablet 3    Sig: Take 1 tablet (30 mg total) by mouth daily.   rosuvastatin (CRESTOR) 40 MG tablet 90 tablet 3    Sig: Take 1 tablet (40 mg total)  by mouth daily.   metFORMIN (GLUCOPHAGE-XR) 500 MG 24 hr tablet 90 tablet 3    Sig: Take 1 tablet (500 mg total) by mouth daily with breakfast.   spironolactone (ALDACTONE) 25 MG tablet 90 tablet 3    Sig: Take 1 tablet (25 mg total) by mouth daily.   gabapentin (NEURONTIN) 100 MG capsule 60 capsule 3    Sig: Take 2 capsules (200 mg total) by mouth at bedtime. For diabetic neuropathy symptoms    Return in about 4 months (around 07/19/2021).  Karle Plumber, MD, FACP

## 2021-03-22 ENCOUNTER — Other Ambulatory Visit: Payer: Self-pay | Admitting: Internal Medicine

## 2021-03-22 DIAGNOSIS — N1831 Chronic kidney disease, stage 3a: Secondary | ICD-10-CM

## 2021-03-22 LAB — CBC
Hematocrit: 39.1 % (ref 34.0–46.6)
Hemoglobin: 12.7 g/dL (ref 11.1–15.9)
MCH: 28.1 pg (ref 26.6–33.0)
MCHC: 32.5 g/dL (ref 31.5–35.7)
MCV: 87 fL (ref 79–97)
Platelets: 311 10*3/uL (ref 150–450)
RBC: 4.52 x10E6/uL (ref 3.77–5.28)
RDW: 14.1 % (ref 11.7–15.4)
WBC: 8.6 10*3/uL (ref 3.4–10.8)

## 2021-03-22 LAB — LIPID PANEL
Chol/HDL Ratio: 4.8 ratio — ABNORMAL HIGH (ref 0.0–4.4)
Cholesterol, Total: 234 mg/dL — ABNORMAL HIGH (ref 100–199)
HDL: 49 mg/dL (ref >39)
LDL Chol Calc (NIH): 163 mg/dL — ABNORMAL HIGH (ref 0–99)
Triglycerides: 123 mg/dL (ref 0–149)
VLDL Cholesterol Cal: 22 mg/dL (ref 5–40)

## 2021-03-22 LAB — COMPREHENSIVE METABOLIC PANEL
ALT: 13 IU/L (ref 0–32)
AST: 16 IU/L (ref 0–40)
Albumin/Globulin Ratio: 1.5 (ref 1.2–2.2)
Albumin: 4.4 g/dL (ref 3.8–4.9)
Alkaline Phosphatase: 82 IU/L (ref 44–121)
BUN/Creatinine Ratio: 20 (ref 9–23)
BUN: 22 mg/dL (ref 6–24)
Bilirubin Total: 0.6 mg/dL (ref 0.0–1.2)
CO2: 24 mmol/L (ref 20–29)
Calcium: 10.1 mg/dL (ref 8.7–10.2)
Chloride: 102 mmol/L (ref 96–106)
Creatinine, Ser: 1.12 mg/dL — ABNORMAL HIGH (ref 0.57–1.00)
Globulin, Total: 3 g/dL (ref 1.5–4.5)
Glucose: 122 mg/dL — ABNORMAL HIGH (ref 70–99)
Potassium: 4.2 mmol/L (ref 3.5–5.2)
Sodium: 140 mmol/L (ref 134–144)
Total Protein: 7.4 g/dL (ref 6.0–8.5)
eGFR: 59 mL/min/{1.73_m2} — ABNORMAL LOW (ref >59)

## 2021-03-22 LAB — TSH+T4F+T3FREE
Free T4: 1.44 ng/dL (ref 0.82–1.77)
T3, Free: 2.8 pg/mL (ref 2.0–4.4)
TSH: 2.02 u[IU]/mL (ref 0.450–4.500)

## 2021-03-22 LAB — VITAMIN B12: Vitamin B-12: 573 pg/mL (ref 232–1245)

## 2021-03-22 LAB — VITAMIN D 25 HYDROXY (VIT D DEFICIENCY, FRACTURES): Vit D, 25-Hydroxy: 25.4 ng/mL — ABNORMAL LOW (ref 30.0–100.0)

## 2021-03-22 MED ORDER — EMPAGLIFLOZIN 10 MG PO TABS: 10.0000 mg | ORAL_TABLET | Freq: Every day | ORAL | 5 refills | Status: DC

## 2021-03-22 MED ORDER — VITAMIN D3 10 MCG (400 UNIT) PO TABS: 400.0000 [IU] | ORAL_TABLET | Freq: Every day | ORAL | 1 refills | Status: DC

## 2021-03-27 ENCOUNTER — Other Ambulatory Visit: Payer: Self-pay

## 2021-04-03 ENCOUNTER — Encounter: Payer: Self-pay | Admitting: Nurse Practitioner

## 2021-04-03 ENCOUNTER — Ambulatory Visit (INDEPENDENT_AMBULATORY_CARE_PROVIDER_SITE_OTHER): Payer: PRIVATE HEALTH INSURANCE

## 2021-04-03 ENCOUNTER — Ambulatory Visit: Payer: Self-pay

## 2021-04-03 ENCOUNTER — Ambulatory Visit (INDEPENDENT_AMBULATORY_CARE_PROVIDER_SITE_OTHER): Payer: Medicaid Other | Admitting: Nurse Practitioner

## 2021-04-03 ENCOUNTER — Other Ambulatory Visit: Payer: Self-pay

## 2021-04-03 ENCOUNTER — Telehealth: Payer: Self-pay | Admitting: *Deleted

## 2021-04-03 VITALS — BP 113/69 | HR 84 | Temp 97.8°F | Resp 16 | Ht 69.0 in | Wt 203.6 lb

## 2021-04-03 DIAGNOSIS — R051 Acute cough: Secondary | ICD-10-CM | POA: Insufficient documentation

## 2021-04-03 DIAGNOSIS — J069 Acute upper respiratory infection, unspecified: Secondary | ICD-10-CM

## 2021-04-03 MED ORDER — PREDNISONE 20 MG PO TABS: 20.0000 mg | ORAL_TABLET | Freq: Every day | ORAL | 0 refills | Status: AC

## 2021-04-03 MED ORDER — PREDNISONE 20 MG PO TABS: 20.0000 mg | ORAL_TABLET | Freq: Every day | ORAL | 0 refills | Status: DC

## 2021-04-03 NOTE — Telephone Encounter (Signed)
Patient scheduled with PCE today at 1300.

## 2021-04-03 NOTE — Patient Instructions (Addendum)
URI Cough:  Meds ordered this encounter  Medications   DISCONTD: predniSONE (DELTASONE) 20 MG tablet    Sig: Take 1 tablet (20 mg total) by mouth daily with breakfast for 5 days.    Dispense:  5 tablet    Refill:  0   predniSONE (DELTASONE) 20 MG tablet    Sig: Take 1 tablet (20 mg total) by mouth daily with breakfast for 5 days.    Dispense:  5 tablet    Refill:  0      Stay well hydrated  Stay active  Deep breathing exercises  May take tylenol or fever or pain  May take mucinex DM twice daily  Will order xray    Follow up:  Follow up if needed

## 2021-04-03 NOTE — Telephone Encounter (Signed)
-----   Message from Michelle Andrew, NP sent at 04/03/2021  4:29 PM EST ----- Could you please call patient to let her know that her xray showed bronchitis. I have sent in prednisone already. Thanks.

## 2021-04-03 NOTE — Progress Notes (Signed)
_0  ID: [name], female    DOB: [birth year], 56 y.o.   MRN: [id number]  Chief Complaint  Patient presents with   Cough    Referring provider: Ladell Pier, MD  HPI  Patient presents today for sick visit.  She was seen by Dr. Wynetta Emery on February 7 and was prescribed amoxicillin for the same issue.  She finished the medication on this past Friday and symptoms returned over the weekend.  She states that she has been having cough, chest congestion, wheezing.  She denies any significant fever.  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 Denies f/c/s, n/v/d, hemoptysis, PND, chest pain or edema.     Allergies  Allergen Reactions   Lisinopril Hives and Swelling    Facial    Sertraline Hives and Swelling    Facial    Tramadol Hives and Swelling    facial   Ibuprofen Hives and Swelling    Immunization History  Administered Date(s) Administered   Influenza,inj,Quad PF,6+ Mos 01/17/2021   PFIZER(Purple Top)SARS-COV-2 Vaccination 05/02/2019, 05/23/2019, 02/18/2020   Pneumococcal Conjugate (Pcv15) 01/17/2021   Pneumococcal Polysaccharide-23 06/20/2016   Tdap 06/20/2016    Past Medical History:  Diagnosis Date   Asthma 05/31/2017   Diabetes mellitus, type 2 ([place])    A1C 7.6 April '13   Hypertension    Left leg pain 07/21/2015   Nonischemic cardiomyopathy ([place])    Echo 05/19/11 EF 35% w/ mild-mod MR; R/L Heart Cath 05/21/11 - mildly elevated right heart pressures, normal left heart pressures, preserved cardiac output, widely patent coronary arteries without significant CAD and moderate global systolic LV dysfunction, EF 35-40%.   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   Accu-Chek Softclix Lancets lancets Use to check blood sugar up to 3 times daily. E11.42   albuterol (PROVENTIL) (2.5 MG/3ML) 0.083% nebulizer solution USE 3 MILLILITERS IN NEBULIZER EVERY 6 HOURS AS NEEDED FOR WHEEZING FOR SHORTNESS OF BREATH   albuterol (VENTOLIN HFA) 108 (90 Base) MCG/ACT inhaler Inhale 2 puffs into the lungs every 6 (six) hours as needed for wheezing or shortness of breath.   aspirin 81 MG chewable tablet Chew 1 tablet (81 mg total) by mouth daily.   blood glucose meter kit and supplies Dispense based on patient and insurance preference. Use up to four times daily as directed. (FOR ICD-9 250.00, 250.01).   Blood Glucose Monitoring Suppl (ACCU-CHEK GUIDE ME) w/Device KIT 1 kit by Does not apply route 3 (three) times daily. Use to check blood sugar up to 3 times daily.   carvedilol (COREG) 12.5 MG tablet TAKE 1 TABLET BY MOUTH TWICE DAILY WITH A MEAL   Cholecalciferol (VITAMIN D3) 10 MCG (400 UNIT) tablet Take 1 tablet (400 Units total) by mouth daily.   Continuous Blood Gluc Receiver (DEXCOM G6 RECEIVER) DEVI 1 Device by Does not apply route daily.   Continuous Blood Gluc Sensor (DEXCOM G6 SENSOR) MISC 1 packet by Does not apply route daily.   Continuous Blood Gluc  Transmit (DEXCOM G6 TRANSMITTER) MISC 1 packet by Does not apply route daily.   DULoxetine (CYMBALTA) 20 MG capsule Take 1 capsule (20 mg total) by mouth daily.   furosemide (LASIX) 40 MG tablet TAKE 1 TABLET BY MOUTH ONCE DAILY FOR FLUID/HEART FAILURE   gabapentin (NEURONTIN) 100 MG capsule Take 2 capsules (200 mg total) by mouth at bedtime. For diabetic neuropathy symptoms   glucose blood (ACCU-CHEK GUIDE) test strip Use as instructed to check blood sugar up to 3 times daily.   hydrALAZINE (APRESOLINE) 50 MG tablet  Take 1 tablet (50 mg total) by mouth 3 (three) times daily.   isosorbide mononitrate (IMDUR) 30 MG 24 hr tablet Take 1 tablet (30 mg total) by mouth daily.   LANTUS SOLOSTAR 100 UNIT/ML Solostar Pen Inject 38 Units into the skin daily.   liraglutide (VICTOZA) 18 MG/3ML SOPN START WITH INJECTING 0.6MG SUBCUTANEOUSLY ONCE A D AY FOR 7 DAYS, THEN INCREASE TO 1.2MG DAILY FOR 7 DAYS, THEN 1.8MG DAILY THEREAFTER   loratadine (CLARITIN) 10 MG tablet Take 1 tablet (10 mg total) by mouth daily.   metFORMIN (GLUCOPHAGE-XR) 500 MG 24 hr tablet Take 1 tablet (500 mg total) by mouth daily with breakfast.   predniSONE (DELTASONE) 20 MG tablet Take 1 tablet (20 mg total) by mouth daily with breakfast for 5 days.   rosuvastatin (CRESTOR) 40 MG tablet Take 1 tablet (40 mg total) by mouth daily.   spironolactone (ALDACTONE) 25 MG tablet Take 1 tablet (25 mg total) by mouth daily.   TRUEPLUS PEN NEEDLES 31G X 6 MM MISC USE AS DIRECTED   No facility-administered encounter medications on file as of 04/03/2021.     Review of Systems  Review of Systems  Constitutional: Negative.   HENT: Negative.    Respiratory:  Positive for cough and wheezing.   Cardiovascular: Negative.   Gastrointestinal: Negative.   Allergic/Immunologic: Negative.   Neurological: Negative.   Psychiatric/Behavioral: Negative.        Physical Exam  BP 113/69    Pulse 84    Temp 97.8 F (36.6 C) (Oral)    Resp 16    Ht _0  (1.753 m)    Wt 203 lb 9.6 oz (92.4 kg)    SpO2 98%    BMI 30.07 kg/m   Wt Readings from Last 5 Encounters:  04/03/21 203 lb 9.6 oz (92.4 kg)  03/21/21 201 lb 6.4 oz (91.4 kg)  01/17/21 204 lb 12.8 oz (92.9 kg)  06/23/20 199 lb (90.3 kg)  01/28/20 202 lb 3.2 oz (91.7 kg)     Physical Exam Vitals and nursing note reviewed.  Constitutional:      General: She is not in acute distress.    Appearance: She is well-developed.  Cardiovascular:     Rate and Rhythm: Normal rate and regular rhythm.  Pulmonary:      Effort: Pulmonary effort is normal.     Breath sounds: Wheezing present.  Neurological:     Mental Status: She is alert and oriented to person, place, and time.     Lab Results:   Assessment & Plan:   Acute cough  Meds ordered this encounter  Medications   DISCONTD: predniSONE (DELTASONE) 20 MG tablet    Sig: Take 1 tablet (20 mg total) by mouth daily with breakfast for 5 days.    Dispense:  5 tablet    Refill:  0   predniSONE (DELTASONE) 20 MG tablet    Sig: Take 1 tablet (20 mg  total) by mouth daily with breakfast for 5 days.    Dispense:  5 tablet    Refill:  0      Stay well hydrated  Stay active  Deep breathing exercises  May take tylenol or fever or pain  May take mucinex DM twice daily  Will order xray    Follow up:  Follow up if needed  Upper respiratory tract infection  Meds ordered this encounter  Medications   DISCONTD: predniSONE (DELTASONE) 20 MG tablet    Sig: Take 1 tablet (20 mg total) by mouth daily with breakfast for 5 days.    Dispense:  5 tablet    Refill:  0   predniSONE (DELTASONE) 20 MG tablet    Sig: Take 1 tablet (20 mg total) by mouth daily with breakfast for 5 days.    Dispense:  5 tablet    Refill:  0      Stay well hydrated  Stay active  Deep breathing exercises  May take tylenol or fever or pain  May take mucinex DM twice daily  Will order xray    Follow up:  Follow up if needed     Fenton Foy, NP 04/03/2021

## 2021-04-03 NOTE — Assessment & Plan Note (Signed)
°  Meds ordered this encounter  Medications   DISCONTD: predniSONE (DELTASONE) 20 MG tablet    Sig: Take 1 tablet (20 mg total) by mouth daily with breakfast for 5 days.    Dispense:  5 tablet    Refill:  0   predniSONE (DELTASONE) 20 MG tablet    Sig: Take 1 tablet (20 mg total) by mouth daily with breakfast for 5 days.    Dispense:  5 tablet    Refill:  0      Stay well hydrated  Stay active  Deep breathing exercises  May take tylenol or fever or pain  May take mucinex DM twice daily  Will order xray    Follow up:  Follow up if needed

## 2021-04-03 NOTE — Telephone Encounter (Signed)
Patient verified DOB Patient is aware of bronchitis being noted and to pickup prednisone and take as prescribed.

## 2021-04-03 NOTE — Assessment & Plan Note (Signed)
°  Meds ordered this encounter  °Medications  °• DISCONTD: predniSONE (DELTASONE) 20 MG tablet  °  Sig: Take 1 tablet (20 mg total) by mouth daily with breakfast for 5 days.  °  Dispense:  5 tablet  °  Refill:  0  °• predniSONE (DELTASONE) 20 MG tablet  °  Sig: Take 1 tablet (20 mg total) by mouth daily with breakfast for 5 days.  °  Dispense:  5 tablet  °  Refill:  0  ° ° ° ° °Stay well hydrated ° °Stay active ° °Deep breathing exercises ° °May take tylenol or fever or pain ° °May take mucinex DM twice daily ° °Will order xray ° ° ° °Follow up: ° °Follow up if needed °

## 2021-04-03 NOTE — Telephone Encounter (Signed)
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  The clinic nurse said she heard wheezing in her lungs. Advised her to call her dr and get chest xray.  Pt says she just saw the dr on 2/07.  No appts. Please advise.    Chief Complaint: Cough with wheezing Symptoms: Above Frequency: 2 weeks ago. States she was seen in office 03/21/21 and she gave "me some coughing pills that helped.The employee nurse thinks I need a chest xray." Requests cough medication go to [facility]. Please advise pt. Pertinent Negatives: Patient denies fever Disposition: [] ED /[] Urgent Care (no appt availability in office) / [] Appointment(In office/virtual)/ []  [place] [facility]/ [] Home Care/ [] Refused Recommended Disposition /[] [place] [facility]/ []  Follow-up with PCP Additional Notes:    Answer Assessment - Initial Assessment Questions 1. RESPIRATORY STATUS: "Describe your breathing?" (e.g., wheezing, shortness of breath, unable to speak, severe coughing)      Wheezing 2. ONSET: "When did this breathing problem begin?"      Today 3. PATTERN "Does the difficult breathing come and go, or has it been constant since it started?"      Comes and goes 4. SEVERITY: "How bad is your breathing?" (e.g., mild, moderate, severe)    - MILD: No SOB at rest, mild SOB with walking, speaks normally in sentences, can lie down, no retractions, pulse < 100.    - MODERATE: SOB at rest, SOB with minimal exertion and prefers to sit, cannot lie down flat, speaks in phrases, mild retractions, audible wheezing, pulse 100-120.    - SEVERE: Very SOB at rest, speaks in single words, struggling to breathe, sitting hunched forward, retractions, pulse > 120      Mild 5. RECURRENT SYMPTOM: "Have you had difficulty breathing before?" If Yes, ask: "When was the last time?" and "What happened that time?"      Yes 6. CARDIAC HISTORY: "Do you have any history of heart disease?" (e.g., heart attack, angina, bypass surgery,  angioplasty)      Yes 7. LUNG HISTORY: "Do you have any history of lung disease?"  (e.g., pulmonary embolus, asthma, emphysema)     No 8. CAUSE: "What do you think is causing the breathing problem?"      Cough 9. OTHER SYMPTOMS: "Do you have any other symptoms? (e.g., dizziness, runny nose, cough, chest pain, fever)     Cough 10. O2 SATURATION MONITOR:  "Do you use an oxygen saturation monitor (pulse oximeter) at home?" If Yes, "What is your reading (oxygen level) today?" "What is your usual oxygen saturation reading?" (e.g., 95%)       No 11. PREGNANCY: "Is there any chance you are pregnant?" "When was your last menstrual period?"       No 12. TRAVEL: "Have you traveled out of the country in the last month?" (e.g., travel history, exposures)       No  Protocols used: Breathing Difficulty-A-AH

## 2021-04-03 NOTE — Progress Notes (Signed)
Patient is her with c/o a continual cough  that has been present for x 2 weeks . Patient has a wheezing in her chest that is concerning to her as well. Patient was given medication by Dr. Laural Benes pcp

## 2021-04-08 ENCOUNTER — Other Ambulatory Visit: Payer: Self-pay | Admitting: Cardiology

## 2021-04-08 DIAGNOSIS — I5021 Acute systolic (congestive) heart failure: Secondary | ICD-10-CM

## 2021-04-08 DIAGNOSIS — I10 Essential (primary) hypertension: Secondary | ICD-10-CM

## 2021-04-08 DIAGNOSIS — I428 Other cardiomyopathies: Secondary | ICD-10-CM

## 2021-04-12 DIAGNOSIS — Z419 Encounter for procedure for purposes other than remedying health state, unspecified: Secondary | ICD-10-CM | POA: Diagnosis not present

## 2021-04-27 NOTE — Progress Notes (Signed)
? ? ? ? ?HPI: FU CM/CHF. Echocardiogram in April of 2013 showed an ejection fraction of 35%, moderate left ventricular enlargement, mild to moderate mitral regurgitation. HIV negative and TSH normal. Cardiac catheterization in April of 2013 showed normal coronary arteries and an ejection fraction of 35-40%. Patient was treated with medications and her cardiomyopathy was felt possibly secondary to hypertension. Echocardiogram repeated in September of 2013. Her ejection fraction was 30-35%. Patient referred to Dr. Rayann Heman for consideration of ICD but the patient decided not to pursue. Also with h/o carotid dissection healed on CTA 4/18. Echocardiogram repeated July 2021 and showed ejection fraction 35 to 40%, moderate left ventricular enlargement, moderate left atrial enlargement, mild to moderate mitral regurgitation.  Since last seen, the patient denies any dyspnea on exertion, orthopnea, PND, pedal edema, palpitations, syncope or chest pain. ? ? ?Current Outpatient Medications  ?Medication Sig Dispense Refill  ? Accu-Chek Softclix Lancets lancets Use to check blood sugar up to 3 times daily. E11.42 100 each 2  ? albuterol (PROVENTIL) (2.5 MG/3ML) 0.083% nebulizer solution USE 3 MILLILITERS IN NEBULIZER EVERY 6 HOURS AS NEEDED FOR WHEEZING FOR SHORTNESS OF BREATH 75 mL 0  ? albuterol (VENTOLIN HFA) 108 (90 Base) MCG/ACT inhaler Inhale 2 puffs into the lungs every 6 (six) hours as needed for wheezing or shortness of breath. 18 g 6  ? aspirin 81 MG chewable tablet Chew 1 tablet (81 mg total) by mouth daily. 30 tablet 3  ? blood glucose meter kit and supplies Dispense based on patient and insurance preference. Use up to four times daily as directed. (FOR ICD-9 250.00, 250.01). 1 each 0  ? Blood Glucose Monitoring Suppl (ACCU-CHEK GUIDE ME) w/Device KIT 1 kit by Does not apply route 3 (three) times daily. Use to check blood sugar up to 3 times daily. 1 kit 0  ? carvedilol (COREG) 12.5 MG tablet TAKE 1 TABLET BY MOUTH  TWICE DAILY WITH A MEAL 180 tablet 1  ? Cholecalciferol (VITAMIN D3) 10 MCG (400 UNIT) tablet Take 1 tablet (400 Units total) by mouth daily. 100 tablet 1  ? Continuous Blood Gluc Receiver (DEXCOM G6 RECEIVER) DEVI 1 Device by Does not apply route daily. 1 each 0  ? Continuous Blood Gluc Sensor (DEXCOM G6 SENSOR) MISC 1 packet by Does not apply route daily. 3 each 11  ? Continuous Blood Gluc Transmit (DEXCOM G6 TRANSMITTER) MISC 1 packet by Does not apply route daily. 1 each 4  ? DULoxetine (CYMBALTA) 20 MG capsule Take 1 capsule (20 mg total) by mouth daily. 30 capsule 5  ? furosemide (LASIX) 40 MG tablet TAKE 1 TABLET BY MOUTH ONCE DAILY FOR FLUID/HEART FAILURE 90 tablet 2  ? gabapentin (NEURONTIN) 100 MG capsule Take 2 capsules (200 mg total) by mouth at bedtime. For diabetic neuropathy symptoms 60 capsule 3  ? glucose blood (ACCU-CHEK GUIDE) test strip Use as instructed to check blood sugar up to 3 times daily. 100 each 11  ? hydrALAZINE (APRESOLINE) 50 MG tablet Take 1 tablet (50 mg total) by mouth 3 (three) times daily. 270 tablet 3  ? isosorbide mononitrate (IMDUR) 30 MG 24 hr tablet Take 1 tablet (30 mg total) by mouth daily. 90 tablet 3  ? LANTUS SOLOSTAR 100 UNIT/ML Solostar Pen Inject 38 Units into the skin daily. 45 mL 3  ? liraglutide (VICTOZA) 18 MG/3ML SOPN START WITH INJECTING 0.6MG SUBCUTANEOUSLY ONCE A D AY FOR 7 DAYS, THEN INCREASE TO 1.2MG DAILY FOR 7 DAYS, THEN 1.8MG DAILY THEREAFTER  6 mL 11  ? loratadine (CLARITIN) 10 MG tablet Take 1 tablet (10 mg total) by mouth daily. 30 tablet 0  ? metFORMIN (GLUCOPHAGE-XR) 500 MG 24 hr tablet Take 1 tablet (500 mg total) by mouth daily with breakfast. 90 tablet 3  ? rosuvastatin (CRESTOR) 40 MG tablet Take 1 tablet (40 mg total) by mouth daily. 90 tablet 3  ? spironolactone (ALDACTONE) 25 MG tablet Take 1 tablet (25 mg total) by mouth daily. 90 tablet 3  ? TRUEPLUS PEN NEEDLES 31G X 6 MM MISC USE AS DIRECTED 100 each 1  ? ?No current facility-administered  medications for this visit.  ? ? ? ?Past Medical History:  ?Diagnosis Date  ? Asthma 05/31/2017  ? Diabetes mellitus, type 2 ([place])   ? A1C 7.6 April '13  ? Hypertension   ? Left leg pain 07/21/2015  ? Nonischemic cardiomyopathy ([place])   ? Echo 05/19/11 EF 35% w/ mild-mod MR; R/L Heart Cath 05/21/11 - mildly elevated right heart pressures, normal left heart pressures, preserved cardiac output, widely patent coronary arteries without significant CAD and moderate global systolic LV dysfunction, EF 35-40%.  ? 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 right sided weakness, now resolved  ? Systolic CHF ([place])   ? Echo 05/19/11 EF 35% w/ mild-mod MR  ? ? ?Past Surgical History:  ?Procedure Laterality Date  ? CERVIX LESION DESTRUCTION    ? DILATION AND CURETTAGE OF UTERUS    ? LEFT HEART CATHETERIZATION WITH CORONARY ANGIOGRAM N/A 05/21/2011  ? Procedure: LEFT HEART CATHETERIZATION WITH CORONARY ANGIOGRAM;  Surgeon: Sherren Mocha, MD;  Location: [facility] CATH LAB;  Service: Cardiovascular;  Laterality: N/A;  ? TUBAL LIGATION  1996  ? ? ?Social History  ? ?Socioeconomic History  ? Marital status: Divorced  ?  Spouse name: Not on file  ? Number of children: 2  ? Years of education: Not on file  ? Highest education level: Not on file  ?Occupational History  ? Not on file  ?Tobacco Use  ? Smoking status: Never  ? Smokeless tobacco: Never  ?Vaping Use  ? Vaping Use: Never used  ?Substance and Sexual Activity  ? Alcohol use: No  ? Drug use: No  ? Sexual activity: Yes  ?  Birth control/protection: None  ?Other Topics Concern  ? Not on file  ?Social History Narrative  ? Lives in [place].  Presently unemployed but attends school full time  ? ?Social Determinants of Health  ? ?Financial Resource Strain: Not on file  ?Food Insecurity: Not on file  ?Transportation Needs: Not on file  ?Physical Activity: Not on file  ?Stress: Not on file  ?Social Connections: Not on file  ?Intimate Partner Violence: Not on file  ? ? ?Family History  ?Problem Relation Age of  Onset  ? Diabetes Other   ?     multiple  ? Heart failure Paternal Grandmother   ? Breast cancer Paternal Grandmother   ? Heart disease Other   ?     multiple  ? Breast cancer Mother   ? ? ?ROS: no fevers or chills, productive cough, hemoptysis, dysphasia, odynophagia, melena, hematochezia, dysuria, hematuria, rash, seizure activity, orthopnea, PND, pedal edema, claudication. Remaining systems are negative. ? ?Physical Exam: ?Well-developed well-nourished in no acute distress.  ?Skin is warm and dry.  ?HEENT is normal.  ?Neck is supple.  ?Chest is clear to auscultation with normal expansion.  ?Cardiovascular exam is regular rate and rhythm.  ?Abdominal exam nontender or distended. No masses palpated. ?  Extremities show no edema. ?neuro grossly intact ? ?ECG-normal sinus rhythm at a rate of 89, nonspecific ST changes.  Personally reviewed ? ?A/P ? ?1 nonischemic cardiomyopathy-patient has a history of angioedema with ACE inhibitors and ARB's; continue hydralazine/nitrates and beta-blocker (increase coreg to 25 mg BID).  Repeat echocardiogram in 3 months.  Patient has declined ICD use in the past.  If ejection fraction less than 35% will readdress. ? ?2 chronic combined systolic/diastolic congestive heart failure-patient is euvolemic on examination.  Continue diuretics at present dose. ? ?3 hypertension-blood pressure controlled.  Continue present medical regimen. ? ?4 hyperlipidemia-continue statin. ? ?5 history of left common carotid artery dissection-this was healed on most recent CT scan. ? ?6 history of noncompliance ? ?7 mitral regurgitation-we will reassess with follow-up echocardiogram. ? ?Kirk Ruths, MD ? ? ? ?

## 2021-05-05 ENCOUNTER — Ambulatory Visit (INDEPENDENT_AMBULATORY_CARE_PROVIDER_SITE_OTHER): Payer: PRIVATE HEALTH INSURANCE | Admitting: Cardiology

## 2021-05-05 ENCOUNTER — Encounter: Payer: Self-pay | Admitting: Cardiology

## 2021-05-05 ENCOUNTER — Other Ambulatory Visit: Payer: Self-pay

## 2021-05-05 VITALS — BP 130/66 | HR 89 | Ht 69.0 in | Wt 202.0 lb

## 2021-05-05 DIAGNOSIS — E1169 Type 2 diabetes mellitus with other specified complication: Secondary | ICD-10-CM

## 2021-05-05 DIAGNOSIS — I5022 Chronic systolic (congestive) heart failure: Secondary | ICD-10-CM

## 2021-05-05 DIAGNOSIS — E785 Hyperlipidemia, unspecified: Secondary | ICD-10-CM

## 2021-05-05 DIAGNOSIS — I428 Other cardiomyopathies: Secondary | ICD-10-CM | POA: Diagnosis not present

## 2021-05-05 DIAGNOSIS — I10 Essential (primary) hypertension: Secondary | ICD-10-CM

## 2021-05-05 MED ORDER — CARVEDILOL 25 MG PO TABS: 25.0000 mg | ORAL_TABLET | Freq: Two times a day (BID) | ORAL | 3 refills | Status: DC

## 2021-05-05 NOTE — Patient Instructions (Signed)
Medication Instructions:  ? ?INCREASE CARVEDILOL TO 25 MG TWICE DAILY= 2 OF THE 12.5 MG TABLETS TWICE DAILY ? ?*If you need a refill on your cardiac medications before your next appointment, please call your pharmacy* ? ? ?Testing/Procedures: ? ?Your physician has requested that you have an echocardiogram. Echocardiography is a painless test that uses sound waves to create images of your heart. It provides your doctor with information about the size and shape of your heart and how well your heart?s chambers and valves are working. This procedure takes approximately one hour. There are no restrictions for this procedure. [place] ?Follow-Up: ?At [facility], you and your health needs are our priority.  As part of our continuing mission to provide you with exceptional heart care, we have created designated Provider Care Teams.  These Care Teams include your primary Cardiologist (physician) and Advanced Practice Providers (APPs -  Physician Assistants and Nurse Practitioners) who all work together to provide you with the care you need, when you need it. ? ?We recommend signing up for the patient portal called "MyChart".  Sign up information is provided on this After Visit Summary.  MyChart is used to connect with patients for Virtual Visits (Telemedicine).  Patients are able to view lab/test results, encounter notes, upcoming appointments, etc.  Non-urgent messages can be sent to your provider as well.   ?To learn more about what you can do with MyChart, go to [email or web address].   ? ?Your next appointment:   ?6 month(s) ? ?The format for your next appointment:   ?In Person ? ?Provider:   ?Kirk Ruths MD ? ? ?

## 2021-05-12 ENCOUNTER — Other Ambulatory Visit: Payer: Self-pay | Admitting: Internal Medicine

## 2021-05-12 DIAGNOSIS — I5042 Chronic combined systolic (congestive) and diastolic (congestive) heart failure: Secondary | ICD-10-CM

## 2021-05-13 DIAGNOSIS — Z419 Encounter for procedure for purposes other than remedying health state, unspecified: Secondary | ICD-10-CM | POA: Diagnosis not present

## 2021-05-15 ENCOUNTER — Telehealth: Payer: Self-pay | Admitting: Internal Medicine

## 2021-05-15 NOTE — Telephone Encounter (Signed)
Copied from CRM [id number]. Topic: General - Other >> May 11, 2021  2:57 PM Pawlus, Maxine Glenn A wrote: Reason for CRM: Pt called in stating she needs a Doctors note for work / letter stating she needs further restrictions, Please advise.

## 2021-05-15 NOTE — Telephone Encounter (Signed)
Pt states she has been working. Pt wen she last seen Dr. Wynetta Emery provider wrote her a note. Pt states she is wanting her letter to reflect 20 flights of stairs. Pt states she is unable to go up to the third floor. Pt states she can up to the first and second floor but not the third because of neuropathy.  ?

## 2021-05-16 NOTE — Telephone Encounter (Signed)
Pls fu with pt no appt soon enough for her,Call after 2:30 !  [phone] ?

## 2021-05-16 NOTE — Telephone Encounter (Signed)
Pt has been scheduled a virtual for 4/6 at 130 ?

## 2021-05-16 NOTE — Telephone Encounter (Signed)
Contacted pt to schedule an virtual appt pt didn't answer lvm  ?

## 2021-05-18 ENCOUNTER — Ambulatory Visit: Payer: PRIVATE HEALTH INSURANCE | Attending: Internal Medicine | Admitting: Internal Medicine

## 2021-05-23 NOTE — Telephone Encounter (Signed)
Pt has been schedule a virtual appt for 4/13 at 810am  ?

## 2021-05-23 NOTE — Telephone Encounter (Signed)
Copied from CRM [id number]. Topic: Appointment Scheduling - Scheduling Inquiry for Clinic ?>> May 22, 2021  9:22 AM Gaetana Michaelis A wrote: ?Reason for CRM: The patient would like to schedule a telephone visit with Dr. Laural Benes on 05/25/21 ? ?The patient will also be leaving work at 2:30 PM the other 4 days they're working  ? ?Please contact further when possible to schedule ?>> May 23, 2021 10:59 AM Gwendel Hanson wrote: ?Returned pt call, no contact. Sending to CMA to schedule. ? ?Please schedule a 8:10 appt ?

## 2021-05-25 ENCOUNTER — Other Ambulatory Visit: Payer: Self-pay

## 2021-05-25 ENCOUNTER — Ambulatory Visit: Payer: PRIVATE HEALTH INSURANCE | Attending: Internal Medicine | Admitting: Internal Medicine

## 2021-05-25 DIAGNOSIS — F411 Generalized anxiety disorder: Secondary | ICD-10-CM

## 2021-05-25 DIAGNOSIS — Z029 Encounter for administrative examinations, unspecified: Secondary | ICD-10-CM | POA: Diagnosis not present

## 2021-05-25 DIAGNOSIS — I5042 Chronic combined systolic (congestive) and diastolic (congestive) heart failure: Secondary | ICD-10-CM

## 2021-05-25 DIAGNOSIS — E1142 Type 2 diabetes mellitus with diabetic polyneuropathy: Secondary | ICD-10-CM | POA: Diagnosis not present

## 2021-05-25 NOTE — Progress Notes (Signed)
Patient ID: [name], female   DOB: Nov 02, 1965, 56 y.o.   MRN: [id number] ?Virtual Visit via Telephone Note ? ?I connected with [name] on 05/25/2021 at 8:13 AM by telephone and verified that I am speaking with the correct person using two identifiers ? ?Location: ?Patient: home ?Provider: office ? ?Participants: ?Myself ?Patient ?  ?I discussed the limitations, risks, security and privacy concerns of performing an evaluation and management service by telephone and the availability of in person appointments. I also discussed with the patient that there may be a patient responsible charge related to this service. The patient expressed understanding and agreed to proceed. ? ? ?History of Present Illness: ?DM with neuropathy, HL, HTN, DCM/sys and diastolic CHF with OE32-12% on 08/2019, lung nodules resolved, hx of LT common carotid dissection, CKD 2-3, mild anemia,  vit D def, anxiety/depression, mild intermittent asthma.  This is an urgent care visit to request letter for work accommodation. ? ?On her visit with me in December, patient had complained of increased anxiety and depression caused by stress at work and her work schedule.  She also reported that her manager was not pleased with her having to use the bathroom frequently which she has to do because she is on furosemide and spironolactone.  We did a letter requesting that she be allowed to work no more than 40 hours a week and that she be allowed to use the restroom more frequently due to the fact that she is on diuretics.  Patient states that things went well until they got a new person in HR who requested that she needs more documentation. ?Between January to now, patient states she has been allowed to work on the first and second floors only where she is able to work most of the time independently by herself.  When she was having to go to the third floor, her anxiety increased because there were more people around and she had to work in groups.   This caused increased stress and anxiety for her.  She would have anxiety attacks at work when she was having to go to the third floor.  She also has diabetic neuropathy.  She tells me that when she has  to climb more than 2 flights of stairs, she has increased pain in her feet and legs from the neuropathy and increased back pain.  We had prescribed gabapentin for her on last visit.  However patient states that her insurance has not approved it.   ? ?She is due for diabetic eye exam.  Requests referral.  Has been having more increased blurred vision recently. ? ? ?Outpatient Encounter Medications as of 05/25/2021  ?Medication Sig  ? Accu-Chek Softclix Lancets lancets Use to check blood sugar up to 3 times daily. E11.42  ? albuterol (PROVENTIL) (2.5 MG/3ML) 0.083% nebulizer solution USE 3 MILLILITERS IN NEBULIZER EVERY 6 HOURS AS NEEDED FOR WHEEZING FOR SHORTNESS OF BREATH  ? albuterol (VENTOLIN HFA) 108 (90 Base) MCG/ACT inhaler Inhale 2 puffs into the lungs every 6 (six) hours as needed for wheezing or shortness of breath.  ? aspirin 81 MG chewable tablet Chew 1 tablet (81 mg total) by mouth daily.  ? blood glucose meter kit and supplies Dispense based on patient and insurance preference. Use up to four times daily as directed. (FOR ICD-9 250.00, 250.01).  ? Blood Glucose Monitoring Suppl (ACCU-CHEK GUIDE ME) w/Device KIT 1 kit by Does not apply route 3 (three) times daily. Use to check blood sugar up to  3 times daily.  ? carvedilol (COREG) 25 MG tablet Take 1 tablet (25 mg total) by mouth 2 (two) times daily with a meal.  ? Cholecalciferol (VITAMIN D3) 10 MCG (400 UNIT) tablet Take 1 tablet (400 Units total) by mouth daily.  ? Continuous Blood Gluc Receiver (DEXCOM G6 RECEIVER) DEVI 1 Device by Does not apply route daily.  ? Continuous Blood Gluc Sensor (DEXCOM G6 SENSOR) MISC 1 packet by Does not apply route daily.  ? Continuous Blood Gluc Transmit (DEXCOM G6 TRANSMITTER) MISC 1 packet by Does not apply route  daily.  ? DULoxetine (CYMBALTA) 20 MG capsule Take 1 capsule (20 mg total) by mouth daily.  ? furosemide (LASIX) 40 MG tablet TAKE 1 TABLET BY MOUTH ONCE DAILY FOR FLUID/HEART FAILURE  ? gabapentin (NEURONTIN) 100 MG capsule Take 2 capsules (200 mg total) by mouth at bedtime. For diabetic neuropathy symptoms  ? glucose blood (ACCU-CHEK GUIDE) test strip Use as instructed to check blood sugar up to 3 times daily.  ? hydrALAZINE (APRESOLINE) 50 MG tablet Take 1 tablet (50 mg total) by mouth 3 (three) times daily.  ? isosorbide mononitrate (IMDUR) 30 MG 24 hr tablet Take 1 tablet (30 mg total) by mouth daily.  ? LANTUS SOLOSTAR 100 UNIT/ML Solostar Pen Inject 38 Units into the skin daily.  ? liraglutide (VICTOZA) 18 MG/3ML SOPN START WITH INJECTING 0.6MG SUBCUTANEOUSLY ONCE A D AY FOR 7 DAYS, THEN INCREASE TO 1.2MG DAILY FOR 7 DAYS, THEN 1.8MG DAILY THEREAFTER  ? loratadine (CLARITIN) 10 MG tablet Take 1 tablet (10 mg total) by mouth daily.  ? metFORMIN (GLUCOPHAGE-XR) 500 MG 24 hr tablet Take 1 tablet (500 mg total) by mouth daily with breakfast.  ? rosuvastatin (CRESTOR) 40 MG tablet Take 1 tablet (40 mg total) by mouth daily.  ? spironolactone (ALDACTONE) 25 MG tablet Take 1 tablet (25 mg total) by mouth daily.  ? TRUEPLUS PEN NEEDLES 31G X 6 MM MISC USE AS DIRECTED  ? ?No facility-administered encounter medications on file as of 05/25/2021.  ? ? ?  ?Observations/Objective: ?No direct observation done as this was a telephone visit. ? ?Assessment and Plan: ?1. Administrative encounter ?2. Type 2 diabetes mellitus with peripheral neuropathy (HCC) ?- Ambulatory referral to Ophthalmology ?3. Generalized anxiety disorder ?4. Chronic combined systolic and diastolic CHF, NYHA class 1 ([place]) ? ?I will write a letter for the patient requesting some work accommodations to include allowing her to continue working on just the first and second floors due to increased pain from neuropathy and worsening anxiety when she has to walk  up 3 flights of stairs and be around more people.  We will also request that she still be allowed to work no more than 40 hours/week which she feels is best for her overall mental health and that she feels it is the restroom given the fact that she is on 2 diuretic medications for her CHF.  Patient is agreeable to this plan. ?-Message sent to our pharmacy tech to see if we can get a prior approval on gabapentin to help with her neuropathy symptoms. ? ?Follow Up Instructions: ?As previously scheduled ?  ?I discussed the assessment and treatment plan with the patient. The patient was provided an opportunity to ask questions and all were answered. The patient agreed with the plan and demonstrated an understanding of the instructions. ?  ?The patient was advised to call back or seek an in-person evaluation if the symptoms worsen or if the condition fails to  improve as anticipated. ? ?I  Spent 12 minutes on this telephone encounter ? ?This note has been created with Surveyor, quantity. Any transcriptional errors are unintentional. ? ?Karle Plumber, MD ? ?

## 2021-05-27 ENCOUNTER — Telehealth: Payer: Self-pay | Admitting: Internal Medicine

## 2021-05-27 ENCOUNTER — Other Ambulatory Visit: Payer: Self-pay | Admitting: Internal Medicine

## 2021-05-27 DIAGNOSIS — E1142 Type 2 diabetes mellitus with diabetic polyneuropathy: Secondary | ICD-10-CM

## 2021-05-27 MED ORDER — GABAPENTIN 100 MG PO CAPS: 200.0000 mg | ORAL_CAPSULE | Freq: Every day | ORAL | 3 refills | Status: DC

## 2021-05-27 NOTE — Telephone Encounter (Signed)
-----   Message from Weldon Picking, CPhT sent at 05/25/2021  3:13 PM EDT ----- ?I called [facility] and they were able to process her prescription through her updated ins at $0.  Pt is aware.  Pt was also advised that she would have to transfer to [facility] per ins preference after her second refill. ?----- Message ----- ?From: Marcine Matar, MD ?Sent: 05/25/2021  12:43 PM EDT ?To: Weldon Picking, CPhT ? ?Patient reports that gabapentin was not approved by her insurance.  She has peripheral neuropathy from diabetes.  Can we please try to get a prior approval on this medication?  Thanks. ? ? ?

## 2021-06-05 ENCOUNTER — Telehealth: Payer: Self-pay | Admitting: Internal Medicine

## 2021-06-12 DIAGNOSIS — Z419 Encounter for procedure for purposes other than remedying health state, unspecified: Secondary | ICD-10-CM | POA: Diagnosis not present

## 2021-07-13 DIAGNOSIS — Z419 Encounter for procedure for purposes other than remedying health state, unspecified: Secondary | ICD-10-CM | POA: Diagnosis not present

## 2021-07-21 ENCOUNTER — Encounter: Payer: Self-pay | Admitting: Internal Medicine

## 2021-07-21 ENCOUNTER — Ambulatory Visit: Payer: PRIVATE HEALTH INSURANCE | Attending: Internal Medicine | Admitting: Internal Medicine

## 2021-07-21 VITALS — BP 134/80 | HR 77 | Wt 207.6 lb

## 2021-07-21 DIAGNOSIS — Z23 Encounter for immunization: Secondary | ICD-10-CM | POA: Diagnosis not present

## 2021-07-21 DIAGNOSIS — E1142 Type 2 diabetes mellitus with diabetic polyneuropathy: Secondary | ICD-10-CM | POA: Diagnosis not present

## 2021-07-21 DIAGNOSIS — E785 Hyperlipidemia, unspecified: Secondary | ICD-10-CM

## 2021-07-21 DIAGNOSIS — E1159 Type 2 diabetes mellitus with other circulatory complications: Secondary | ICD-10-CM | POA: Diagnosis not present

## 2021-07-21 DIAGNOSIS — N1831 Chronic kidney disease, stage 3a: Secondary | ICD-10-CM

## 2021-07-21 DIAGNOSIS — E1169 Type 2 diabetes mellitus with other specified complication: Secondary | ICD-10-CM | POA: Diagnosis not present

## 2021-07-21 DIAGNOSIS — I5042 Chronic combined systolic (congestive) and diastolic (congestive) heart failure: Secondary | ICD-10-CM

## 2021-07-21 DIAGNOSIS — I152 Hypertension secondary to endocrine disorders: Secondary | ICD-10-CM

## 2021-07-21 LAB — POCT GLYCOSYLATED HEMOGLOBIN (HGB A1C): HbA1c, POC (controlled diabetic range): 8.3 % — AB (ref 0.0–7.0)

## 2021-07-21 LAB — GLUCOSE, POCT (MANUAL RESULT ENTRY): POC Glucose: 277 mg/dl — AB (ref 70–99)

## 2021-07-21 MED ORDER — ROSUVASTATIN CALCIUM 40 MG PO TABS: 40.0000 mg | ORAL_TABLET | Freq: Every day | ORAL | 3 refills | Status: DC

## 2021-07-21 MED ORDER — TRULICITY 0.75 MG/0.5ML ~~LOC~~ SOAJ: 0.7500 mg | SUBCUTANEOUS | 6 refills | Status: DC

## 2021-07-21 NOTE — Patient Instructions (Addendum)
We are changing the Victoza to Trulicity.  The Trulicity is a once weekly injection.  If your insurance pays for it, you should stop the Victoza.  However if your insurance does not pay for the Trulicity, you should continue the Victoza.  I have sent the prescription for your cholesterol medicine and the rosuvastatin to [facility].

## 2021-07-21 NOTE — Progress Notes (Signed)
Patient ID: [name], female    DOB: 1965/02/15  MRN: [id number]  CC: Chronic disease management  Subjective: [name] is a 56 y.o. female who presents for chronic disease management Her concerns today include:  DM with neuropathy, HL, HTN, DCM/sys and diastolic CHF with MB84-66% on 08/2019, lung nodules resolved, hx of LT common carotid dissection, CKD 2-3, mild anemia,  vit D def, anxiety/depression, mild intermittent asthma.  DM/Obesity: Results for orders placed or performed in visit on 07/21/21  POCT glucose (manual entry)  Result Value Ref Range   POC Glucose 277 (A) 70 - 99 mg/dl  POCT glycosylated hemoglobin (Hb A1C)  Result Value Ref Range   Hemoglobin A1C     HbA1c POC (<> result, manual entry)     HbA1c, POC (prediabetic range)     HbA1c, POC (controlled diabetic range) 8.3 (A) 0.0 - 7.0 %  Reports compliance with Lantus 38 units daily, Metformin XR 500 mg once a day. Admits that she has not taken the Victoza consistently every day over the past mth.  No S.E, just not taking -eating habits: has been eating more than she should since she was not taking the Victoza consistenly -still walking 3 days a wk and does a lot of walking at work Has eye appt with Dr. Schuyler Amor scheduled for 06/9933  HTN/systolic and diastolic CHF/NICM: compliant with meds and salt restrictions No CP/SOB/LE edema Still did not get Crestor 40 mg that I sent to [place] on her last in-person visit Last LDL was 163. Kidney function stable with last creatinine of 1.12 and GFR of 59.   Patient Active Problem List   Diagnosis Date Noted   Acute cough 04/03/2021   Upper respiratory tract infection 04/03/2021   Anxiety and depression 01/17/2021   Sinus tachycardia 10/15/2018   Vitamin D deficiency 03/26/2018   Homeless 03/24/2018   Microalbuminuria 08/11/2017   Anemia 08/08/2017   Physical deconditioning    Hyperlipidemia 02/21/2017   Diabetic polyneuropathy associated with type  2 diabetes mellitus ([place]) 02/21/2017   Lung nodules 07/16/2016   Uncontrolled type 2 diabetes mellitus without complication, with long-term current use of insulin 07/16/2016   DCM (dilated cardiomyopathy) ([place])    Acute on chronic combined systolic and diastolic CHF (congestive heart failure) ([place]) 06/10/2016   Essential hypertension 06/10/2016   Dyslipidemia associated with type 2 diabetes mellitus ([place]) 06/10/2016     Current Outpatient Medications on File Prior to Visit  Medication Sig Dispense Refill   Accu-Chek Softclix Lancets lancets Use to check blood sugar up to 3 times daily. E11.42 100 each 2   albuterol (PROVENTIL) (2.5 MG/3ML) 0.083% nebulizer solution USE 3 MILLILITERS IN NEBULIZER EVERY 6 HOURS AS NEEDED FOR WHEEZING FOR SHORTNESS OF BREATH 75 mL 0   albuterol (VENTOLIN HFA) 108 (90 Base) MCG/ACT inhaler Inhale 2 puffs into the lungs every 6 (six) hours as needed for wheezing or shortness of breath. 18 g 6   aspirin 81 MG chewable tablet Chew 1 tablet (81 mg total) by mouth daily. 30 tablet 3   blood glucose meter kit and supplies Dispense based on patient and insurance preference. Use up to four times daily as directed. (FOR ICD-9 250.00, 250.01). 1 each 0   Blood Glucose Monitoring Suppl (ACCU-CHEK GUIDE ME) w/Device KIT 1 kit by Does not apply route 3 (three) times daily. Use to check blood sugar up to 3 times daily. 1 kit 0   carvedilol (COREG) 25 MG tablet Take 1  tablet (25 mg total) by mouth 2 (two) times daily with a meal. 180 tablet 3   Cholecalciferol (VITAMIN D3) 10 MCG (400 UNIT) tablet Take 1 tablet (400 Units total) by mouth daily. 100 tablet 1   Continuous Blood Gluc Receiver (DEXCOM G6 RECEIVER) DEVI 1 Device by Does not apply route daily. 1 each 0   Continuous Blood Gluc Sensor (DEXCOM G6 SENSOR) MISC 1 packet by Does not apply route daily. 3 each 11   Continuous Blood Gluc Transmit (DEXCOM G6 TRANSMITTER) MISC 1 packet by Does not apply route daily. 1 each 4    DULoxetine (CYMBALTA) 20 MG capsule Take 1 capsule (20 mg total) by mouth daily. 30 capsule 5   furosemide (LASIX) 40 MG tablet TAKE 1 TABLET BY MOUTH ONCE DAILY FOR FLUID/HEART FAILURE 90 tablet 0   gabapentin (NEURONTIN) 100 MG capsule Take 2 capsules (200 mg total) by mouth at bedtime. For diabetic neuropathy symptoms 60 capsule 3   glucose blood (ACCU-CHEK GUIDE) test strip Use as instructed to check blood sugar up to 3 times daily. 100 each 11   hydrALAZINE (APRESOLINE) 50 MG tablet Take 1 tablet (50 mg total) by mouth 3 (three) times daily. 270 tablet 3   isosorbide mononitrate (IMDUR) 30 MG 24 hr tablet Take 1 tablet (30 mg total) by mouth daily. 90 tablet 3   LANTUS SOLOSTAR 100 UNIT/ML Solostar Pen Inject 38 Units into the skin daily. 45 mL 3   liraglutide (VICTOZA) 18 MG/3ML SOPN START WITH INJECTING 0.6MG SUBCUTANEOUSLY ONCE A D AY FOR 7 DAYS, THEN INCREASE TO 1.2MG DAILY FOR 7 DAYS, THEN 1.8MG DAILY THEREAFTER 6 mL 11   loratadine (CLARITIN) 10 MG tablet Take 1 tablet (10 mg total) by mouth daily. 30 tablet 0   metFORMIN (GLUCOPHAGE-XR) 500 MG 24 hr tablet Take 1 tablet (500 mg total) by mouth daily with breakfast. 90 tablet 3   spironolactone (ALDACTONE) 25 MG tablet Take 1 tablet (25 mg total) by mouth daily. 90 tablet 3   TRUEPLUS PEN NEEDLES 31G X 6 MM MISC USE AS DIRECTED 100 each 1   No current facility-administered medications on file prior to visit.    Allergies  Allergen Reactions   Lisinopril Hives and Swelling    Facial    Sertraline Hives and Swelling    Facial    Tramadol Hives and Swelling    facial   Ibuprofen Hives and Swelling    Social History   Socioeconomic History   Marital status: Divorced    Spouse name: Not on file   Number of children: 2   Years of education: Not on file   Highest education level: Not on file  Occupational History   Not on file  Tobacco Use   Smoking status: Never   Smokeless tobacco: Never  Vaping Use   Vaping Use: Never  used  Substance and Sexual Activity   Alcohol use: No   Drug use: No   Sexual activity: Yes    Birth control/protection: None  Other Topics Concern   Not on file  Social History Narrative   Lives in [place].  Presently unemployed but attends school full time   Social Determinants of Radio broadcast assistant Strain: Not on file  Food Insecurity: Not on file  Transportation Needs: Not on file  Physical Activity: Not on file  Stress: Not on file  Social Connections: Not on file  Intimate Partner Violence: Not on file    Family History  Problem Relation Age of Onset   Diabetes Other        multiple   Heart failure Paternal Grandmother    Breast cancer Paternal Grandmother    Heart disease Other        multiple   Breast cancer Mother     Past Surgical History:  Procedure Laterality Date   CERVIX LESION DESTRUCTION     DILATION AND CURETTAGE OF UTERUS     LEFT HEART CATHETERIZATION WITH CORONARY ANGIOGRAM N/A 05/21/2011   Procedure: LEFT HEART CATHETERIZATION WITH CORONARY ANGIOGRAM;  Surgeon: Sherren Mocha, MD;  Location: [facility] CATH LAB;  Service: Cardiovascular;  Laterality: N/A;   TUBAL LIGATION  1996    ROS: Review of Systems Negative except as stated above  PHYSICAL EXAM: BP 134/80   Pulse 77   Wt 207 lb 9.6 oz (94.2 kg)   SpO2 93%   BMI 30.66 kg/m   Wt Readings from Last 3 Encounters:  07/21/21 207 lb 9.6 oz (94.2 kg)  05/05/21 202 lb (91.6 kg)  04/03/21 203 lb 9.6 oz (92.4 kg)    Physical Exam  General appearance - alert, well appearing, middle-aged African-American female and in no distress Mental status - normal mood, behavior, speech, dress, motor activity, and thought processes Neck - supple, no significant adenopathy Chest - clear to auscultation, no wheezes, rales or rhonchi, symmetric air entry Heart - normal rate, regular rhythm, normal S1, S2, no murmurs, rubs, clicks or gallops Extremities - peripheral pulses normal, no pedal edema, no  clubbing or cyanosis      Latest Ref Rng & Units 03/21/2021    4:04 PM 06/15/2020    8:09 AM 02/09/2020    8:19 AM  CMP  Glucose 70 - 99 mg/dL 122   144   BUN 6 - 24 mg/dL 22   17   Creatinine 0.57 - 1.00 mg/dL 1.12   1.07   Sodium 134 - 144 mmol/L 140   139   Potassium 3.5 - 5.2 mmol/L 4.2   4.2   Chloride 96 - 106 mmol/L 102   101   CO2 20 - 29 mmol/L 24   25   Calcium 8.7 - 10.2 mg/dL 10.1   9.8   Total Protein 6.0 - 8.5 g/dL 7.4  6.6  6.8   Total Bilirubin 0.0 - 1.2 mg/dL 0.6  0.7  0.5   Alkaline Phos 44 - 121 IU/L 82  78  82   AST 0 - 40 IU/L _0 ALT 0 - 32 IU/L _1 Lipid Panel     Component Value Date/Time   CHOL 234 (H) 03/21/2021 1604   TRIG 123 03/21/2021 1604   HDL 49 03/21/2021 1604   CHOLHDL 4.8 (H) 03/21/2021 1604   CHOLHDL 6.4 (H) 05/05/2015 1645   VLDL 37 (H) 05/05/2015 1645   LDLCALC 163 (H) 03/21/2021 1604    CBC    Component Value Date/Time   WBC 8.6 03/21/2021 1604   WBC 8.9 07/25/2017 0837   RBC 4.52 03/21/2021 1604   RBC 3.71 (L) 07/25/2017 0837   HGB 12.7 03/21/2021 1604   HCT 39.1 03/21/2021 1604   PLT 311 03/21/2021 1604   MCV 87 03/21/2021 1604   MCH 28.1 03/21/2021 1604   MCH 26.4 07/25/2017 0837   MCHC 32.5 03/21/2021 1604   MCHC 30.6 07/25/2017 0837   RDW 14.1 03/21/2021 1604   LYMPHSABS 2.5 07/31/2017 1225  MONOABS 0.6 06/02/2017 0530   EOSABS 0.2 07/31/2017 1225   BASOSABS 0.1 07/31/2017 1225    ASSESSMENT AND PLAN: 1. Type 2 diabetes mellitus with peripheral neuropathy (HCC) Not at goal due to not taking Victoza consistently.  She denies any side effects from the medication.  I recommend changing from Victoza to Trulicity so that injections will be once a week rather than daily.   -Went over possible side effects of Trulicity including the fact that it can cause nausea.  Advised that if she develops any vomiting or pain in the upper abdomen she should stop the medication and give me a call.  If her insurance does  not pay for the Trulicity, then she should continue the Victoza and take it daily as prescribed. Continue Lantus insulin 38 units daily and metformin 500 mg once a day. Keep upcoming appointment for eye exam in August. Encourage healthy eating habits. Continue regular exercise. - POCT glucose (manual entry) - POCT glycosylated hemoglobin (Hb A1C) - Dulaglutide (TRULICITY) 8.18 EX/9.3ZJ SOPN; Inject 0.75 mg into the skin once a week.  Dispense: 2 mL; Refill: 6  2. Dyslipidemia associated with type 2 diabetes mellitus ([place]) Informed patient that she needs to take the Crestor.  I will send the prescription again to her pharmacy.  She requested that it be sent to [facility]. - rosuvastatin (CRESTOR) 40 MG tablet; Take 1 tablet (40 mg total) by mouth daily.  Dispense: 90 tablet; Refill: 3  3. Hypertension associated with diabetes ([place]) Close to goal.  Continue carvedilol, spironolactone, isosorbide, hydralazine  4. Chronic combined systolic and diastolic CHF, NYHA class 1 (HCC) Stable and compensated.  Continue furosemide, spironolactone, carvedilol and hydralazine  5. Stage 3a chronic kidney disease (HCC) Stable.  We will continue to monitor.  Avoid NSAIDs.  6. Need for shingles vaccine Patient agreeable to receiving first shot of Shingrix today.  This was given.     Patient was given the opportunity to ask questions.  Patient verbalized understanding of the plan and was able to repeat key elements of the plan.   This documentation was completed using Radio producer.  Any transcriptional errors are unintentional.  Orders Placed This Encounter  Procedures   Varicella-zoster vaccine IM (Shingrix)   POCT glucose (manual entry)   POCT glycosylated hemoglobin (Hb A1C)     Requested Prescriptions   Signed Prescriptions Disp Refills   Dulaglutide (TRULICITY) 6.96 VE/9.3YB SOPN 2 mL 6    Sig: Inject 0.75 mg into the skin once a week.   rosuvastatin (CRESTOR) 40 MG  tablet 90 tablet 3    Sig: Take 1 tablet (40 mg total) by mouth daily.    Return in about 4 months (around 11/20/2021).  Karle Plumber, MD, FACP

## 2021-07-24 ENCOUNTER — Ambulatory Visit (HOSPITAL_COMMUNITY): Payer: PRIVATE HEALTH INSURANCE | Attending: Cardiovascular Disease

## 2021-07-24 DIAGNOSIS — I428 Other cardiomyopathies: Secondary | ICD-10-CM | POA: Diagnosis not present

## 2021-07-24 DIAGNOSIS — I5022 Chronic systolic (congestive) heart failure: Secondary | ICD-10-CM

## 2021-07-24 LAB — ECHOCARDIOGRAM COMPLETE
Area-P 1/2: 5.38 cm2
MV M vel: 5.4 m/s
MV Peak grad: 116.6 mmHg
Radius: 0.7 cm
S' Lateral: 4.5 cm

## 2021-07-28 ENCOUNTER — Encounter: Payer: Self-pay | Admitting: *Deleted

## 2021-08-12 ENCOUNTER — Other Ambulatory Visit: Payer: Self-pay | Admitting: Internal Medicine

## 2021-08-12 DIAGNOSIS — Z419 Encounter for procedure for purposes other than remedying health state, unspecified: Secondary | ICD-10-CM | POA: Diagnosis not present

## 2021-08-12 DIAGNOSIS — I5042 Chronic combined systolic (congestive) and diastolic (congestive) heart failure: Secondary | ICD-10-CM

## 2021-08-14 ENCOUNTER — Other Ambulatory Visit: Payer: Self-pay | Admitting: Internal Medicine

## 2021-08-14 NOTE — Telephone Encounter (Signed)
Requested Prescriptions  Pending Prescriptions Disp Refills  . furosemide (LASIX) 40 MG tablet [Pharmacy Med Name: Furosemide 40 MG Oral Tablet] 90 tablet 0    Sig: TAKE 1 TABLET BY MOUTH ONCE DAILY FOR  FLUID/HEART  FAILURE     Cardiovascular:  Diuretics - Loop Failed - 08/12/2021  9:39 AM      Failed - Cr in normal range and within 180 days    Creat  Date Value Ref Range Status  06/29/2016 0.92 0.50 - 1.10 mg/dL Final   Creatinine, Ser  Date Value Ref Range Status  03/21/2021 1.12 (H) 0.57 - 1.00 mg/dL Final         Failed - Mg Level in normal range and within 180 days    Magnesium  Date Value Ref Range Status  07/25/2017 1.8 1.7 - 2.4 mg/dL Final    Comment:    Performed at [facility], [address] [place], [place] [address]         Passed - K in normal range and within 180 days    Potassium  Date Value Ref Range Status  03/21/2021 4.2 3.5 - 5.2 mmol/L Final         Passed - Ca in normal range and within 180 days    Calcium  Date Value Ref Range Status  03/21/2021 10.1 8.7 - 10.2 mg/dL Final         Passed - Na in normal range and within 180 days    Sodium  Date Value Ref Range Status  03/21/2021 140 134 - 144 mmol/L Final         Passed - Cl in normal range and within 180 days    Chloride  Date Value Ref Range Status  03/21/2021 102 96 - 106 mmol/L Final         Passed - Last BP in normal range    BP Readings from Last 1 Encounters:  07/21/21 134/80         Passed - Valid encounter within last 6 months    Recent Outpatient Visits          3 weeks ago Type 2 diabetes mellitus with peripheral neuropathy (HCC)   [facility] Marcine Matar, MD   2 months ago Administrative encounter   [facility] Marcine Matar, MD   4 months ago Acute cough   [facility] at [facility], Gildardo Pounds, NP   4 months ago Type 2 diabetes mellitus with peripheral neuropathy [facility])   [facility] Jonah Blue B, MD   6 months ago Type 2 diabetes mellitus with peripheral neuropathy [facility])   [facility] Marcine Matar, MD      Future Appointments            In 2 months Crenshaw, Madolyn Frieze, MD [facility], [facility]

## 2021-09-12 ENCOUNTER — Other Ambulatory Visit: Payer: Self-pay | Admitting: Internal Medicine

## 2021-09-12 DIAGNOSIS — I5042 Chronic combined systolic (congestive) and diastolic (congestive) heart failure: Secondary | ICD-10-CM

## 2021-09-12 DIAGNOSIS — Z419 Encounter for procedure for purposes other than remedying health state, unspecified: Secondary | ICD-10-CM | POA: Diagnosis not present

## 2021-09-12 NOTE — Telephone Encounter (Signed)
Medication Refill - Medication: furosemide (LASIX) 40 MG tablet  Has the patient contacted their pharmacy? Yes.   Pt switched pharmacies and needs Rx sent to new pharmacy   Preferred Pharmacy (with phone number or street name):  [id number] Ginette Otto, [place] - [address] AT [facility] & [address] Phone:  [phone]  Fax:  [phone]     Has the patient been seen for an appointment in the last year OR does the patient have an upcoming appointment? Yes.    Agent: Please be advised that RX refills may take up to 3 business days. We ask that you follow-up with your pharmacy.

## 2021-09-13 MED ORDER — FUROSEMIDE 40 MG PO TABS: ORAL_TABLET | ORAL | 0 refills | Status: DC

## 2021-09-13 NOTE — Telephone Encounter (Signed)
Requested Prescriptions  Pending Prescriptions Disp Refills  . furosemide (LASIX) 40 MG tablet 90 tablet 0     Cardiovascular:  Diuretics - Loop Failed - 09/12/2021  1:18 PM      Failed - Cr in normal range and within 180 days    Creat  Date Value Ref Range Status  06/29/2016 0.92 0.50 - 1.10 mg/dL Final   Creatinine, Ser  Date Value Ref Range Status  03/21/2021 1.12 (H) 0.57 - 1.00 mg/dL Final         Failed - Mg Level in normal range and within 180 days    Magnesium  Date Value Ref Range Status  07/25/2017 1.8 1.7 - 2.4 mg/dL Final    Comment:    Performed at [facility], [address], [place], [place] [address]         Passed - K in normal range and within 180 days    Potassium  Date Value Ref Range Status  03/21/2021 4.2 3.5 - 5.2 mmol/L Final         Passed - Ca in normal range and within 180 days    Calcium  Date Value Ref Range Status  03/21/2021 10.1 8.7 - 10.2 mg/dL Final         Passed - Na in normal range and within 180 days    Sodium  Date Value Ref Range Status  03/21/2021 140 134 - 144 mmol/L Final         Passed - Cl in normal range and within 180 days    Chloride  Date Value Ref Range Status  03/21/2021 102 96 - 106 mmol/L Final         Passed - Last BP in normal range    BP Readings from Last 1 Encounters:  07/21/21 134/80         Passed - Valid encounter within last 6 months    Recent Outpatient Visits          1 month ago Type 2 diabetes mellitus with peripheral neuropathy (HCC)   [facility] Marcine Matar, MD   3 months ago Administrative encounter   [facility] Marcine Matar, MD   5 months ago Acute cough   [facility] at [facility], Gildardo Pounds, NP   5 months ago Type 2 diabetes mellitus with peripheral neuropathy [facility])   [facility] Jonah Blue B, MD   7 months ago Type 2 diabetes mellitus with peripheral  neuropathy [facility])   [facility] Marcine Matar, MD      Future Appointments            In 1 month Crenshaw, Madolyn Frieze, MD [facility], [facility]

## 2021-09-21 LAB — HM DIABETES EYE EXAM

## 2021-10-04 ENCOUNTER — Encounter: Payer: Self-pay | Admitting: Internal Medicine

## 2021-10-04 DIAGNOSIS — E113299 Type 2 diabetes mellitus with mild nonproliferative diabetic retinopathy without macular edema, unspecified eye: Secondary | ICD-10-CM | POA: Insufficient documentation

## 2021-10-10 NOTE — Progress Notes (Signed)
HPI: FU CM/CHF. Echocardiogram in April of 2013 showed an ejection fraction of 35%, moderate left ventricular enlargement, mild to moderate mitral regurgitation. HIV negative and TSH normal. Cardiac catheterization in April of 2013 showed normal coronary arteries and an ejection fraction of 35-40%. Patient was treated with medications and her cardiomyopathy was felt possibly secondary to hypertension. Echocardiogram repeated in September of 2013. Her ejection fraction was 30-35%. Patient referred to Dr. Rayann Heman for consideration of ICD but the patient decided not to pursue. Also with h/o carotid dissection healed on CTA 4/18. Follow-up echocardiogram June 2023 showed ejection fraction 45 to 50%, mild to moderate left ventricular enlargement, moderate mitral regurgitation.  Since last seen, she denies dyspnea, chest pain, palpitations or syncope.  She lost her sister recently due to a myocardial infarction.  Current Outpatient Medications  Medication Sig Dispense Refill   Accu-Chek Softclix Lancets lancets Use to check blood sugar up to 3 times daily. E11.42 100 each 2   albuterol (PROVENTIL) (2.5 MG/3ML) 0.083% nebulizer solution USE 3 MILLILITERS IN NEBULIZER EVERY 6 HOURS AS NEEDED FOR WHEEZING FOR SHORTNESS OF BREATH 75 mL 0   albuterol (VENTOLIN HFA) 108 (90 Base) MCG/ACT inhaler Inhale 2 puffs into the lungs every 6 (six) hours as needed for wheezing or shortness of breath. 18 g 6   aspirin 81 MG chewable tablet Chew 1 tablet (81 mg total) by mouth daily. 30 tablet 3   blood glucose meter kit and supplies Dispense based on patient and insurance preference. Use up to four times daily as directed. (FOR ICD-9 250.00, 250.01). 1 each 0   Blood Glucose Monitoring Suppl (ACCU-CHEK GUIDE ME) w/Device KIT 1 kit by Does not apply route 3 (three) times daily. Use to check blood sugar up to 3 times daily. 1 kit 0   carvedilol (COREG) 25 MG tablet Take 1 tablet (25 mg total) by mouth 2 (two) times daily  with a meal. 180 tablet 3   Dulaglutide (TRULICITY) 0.05 RT/0.2TR SOPN Inject 0.75 mg into the skin once a week. 2 mL 6   DULoxetine (CYMBALTA) 20 MG capsule Take 1 capsule (20 mg total) by mouth daily. 30 capsule 5   furosemide (LASIX) 40 MG tablet TAKE 1 TABLET BY MOUTH ONCE DAILY FOR  FLUID/HEART  FAILURE 90 tablet 0   glucose blood (ACCU-CHEK GUIDE) test strip Use as instructed to check blood sugar up to 3 times daily. 100 each 11   hydrALAZINE (APRESOLINE) 50 MG tablet Take 1 tablet (50 mg total) by mouth 3 (three) times daily. 270 tablet 3   isosorbide mononitrate (IMDUR) 30 MG 24 hr tablet Take 1 tablet (30 mg total) by mouth daily. 90 tablet 3   LANTUS SOLOSTAR 100 UNIT/ML Solostar Pen Inject 38 Units into the skin daily. 45 mL 3   liraglutide (VICTOZA) 18 MG/3ML SOPN START WITH INJECTING 0.6MG SUBCUTANEOUSLY ONCE A D AY FOR 7 DAYS, THEN INCREASE TO 1.2MG DAILY FOR 7 DAYS, THEN 1.8MG DAILY THEREAFTER 6 mL 11   loratadine (CLARITIN) 10 MG tablet Take 1 tablet (10 mg total) by mouth daily. 30 tablet 0   metFORMIN (GLUCOPHAGE-XR) 500 MG 24 hr tablet Take 1 tablet by mouth once daily with breakfast 90 tablet 1   spironolactone (ALDACTONE) 25 MG tablet Take 1 tablet (25 mg total) by mouth daily. 90 tablet 3   TRUEPLUS PEN NEEDLES 31G X 6 MM MISC USE AS DIRECTED 100 each 1   Cholecalciferol (VITAMIN D3) 10 MCG (400 UNIT)  tablet Take 1 tablet (400 Units total) by mouth daily. (Patient not taking: Reported on 10/23/2021) 100 tablet 1   Continuous Blood Gluc Receiver (DEXCOM G6 RECEIVER) DEVI 1 Device by Does not apply route daily. (Patient not taking: Reported on 10/23/2021) 1 each 0   Continuous Blood Gluc Sensor (DEXCOM G6 SENSOR) MISC 1 packet by Does not apply route daily. (Patient not taking: Reported on 10/23/2021) 3 each 11   Continuous Blood Gluc Transmit (DEXCOM G6 TRANSMITTER) MISC 1 packet by Does not apply route daily. (Patient not taking: Reported on 10/23/2021) 1 each 4   gabapentin  (NEURONTIN) 100 MG capsule Take 2 capsules (200 mg total) by mouth at bedtime. For diabetic neuropathy symptoms (Patient not taking: Reported on 10/23/2021) 60 capsule 3   rosuvastatin (CRESTOR) 40 MG tablet Take 1 tablet (40 mg total) by mouth daily. (Patient not taking: Reported on 10/23/2021) 90 tablet 3   No current facility-administered medications for this visit.     Past Medical History:  Diagnosis Date   Asthma 05/31/2017   Cataracts, bilateral    Age-related   Diabetes mellitus, type 2 ([place])    A1C 7.6 April '13   Hypertension    Hypertensive retinopathy    Left leg pain 07/21/2015   Nonischemic cardiomyopathy ([place])    Echo 05/19/11 EF 35% w/ mild-mod MR; R/L Heart Cath 05/21/11 - mildly elevated right heart pressures, normal left heart pressures, preserved cardiac output, widely patent coronary arteries without significant CAD and moderate global systolic LV dysfunction, EF 35-40%.   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   right sided weakness, now resolved   Systolic CHF ([place])    Echo 05/19/11 EF 35% w/ mild-mod MR    Past Surgical History:  Procedure Laterality Date   CERVIX LESION DESTRUCTION     DILATION AND CURETTAGE OF UTERUS     LEFT HEART CATHETERIZATION WITH CORONARY ANGIOGRAM N/A 05/21/2011   Procedure: LEFT HEART CATHETERIZATION WITH CORONARY ANGIOGRAM;  Surgeon: Sherren Mocha, MD;  Location: [facility] CATH LAB;  Service: Cardiovascular;  Laterality: N/A;   TUBAL LIGATION  1996    Social History   Socioeconomic History   Marital status: Divorced    Spouse name: Not on file   Number of children: 2   Years of education: Not on file   Highest education level: Not on file  Occupational History   Not on file  Tobacco Use   Smoking status: Never   Smokeless tobacco: Never  Vaping Use   Vaping Use: Never used  Substance and Sexual Activity   Alcohol use: No   Drug use: No   Sexual activity: Yes    Birth control/protection: None  Other Topics Concern   Not on file   Social History Narrative   Lives in [place].  Presently unemployed but attends school full time   Social Determinants of Radio broadcast assistant Strain: Not on file  Food Insecurity: Not on file  Transportation Needs: Not on file  Physical Activity: Not on file  Stress: Not on file  Social Connections: Not on file  Intimate Partner Violence: Not on file    Family History  Problem Relation Age of Onset   Diabetes Other        multiple   Heart failure Paternal Grandmother    Breast cancer Paternal Grandmother    Heart disease Other        multiple   Breast cancer Mother     ROS: no fevers or chills, productive  cough, hemoptysis, dysphasia, odynophagia, melena, hematochezia, dysuria, hematuria, rash, seizure activity, orthopnea, PND, pedal edema, claudication. Remaining systems are negative.  Physical Exam: Well-developed well-nourished in no acute distress.  Skin is warm and dry.  HEENT is normal.  Neck is supple.  Chest is clear to auscultation with normal expansion.  Cardiovascular exam is regular rate and rhythm.  Abdominal exam nontender or distended. No masses palpated. Extremities show no edema. neuro grossly intact  A/P  1 NICM-most recent ejection fraction 45 to 50%.  She has a history of angioedema with ACE inhibitors.  We will therefore continue carvedilol and hydralazine/nitrates.  2 chronic combined systolic/diastolic congestive heart failure-she remains euvolemic on examination.  Continue diuretic at present dose.  I have asked her to discuss an SGLT2 inhibitor with her diabetes doctor.  3 hypertension-blood pressure controlled.  Continue present medications.  4 hyperlipidemia-continue statin.  Check lipids and liver.  Goal LDL less than 50.  5 status post left common carotid artery dissection-this was healed on most recent CT scan.  6 mitral regurgitation-moderate on most recent echocardiogram.  She will need follow-up studies in the future.  7  history of noncompliance  Kirk Ruths, MD

## 2021-10-13 DIAGNOSIS — Z419 Encounter for procedure for purposes other than remedying health state, unspecified: Secondary | ICD-10-CM | POA: Diagnosis not present

## 2021-10-23 ENCOUNTER — Ambulatory Visit: Payer: PRIVATE HEALTH INSURANCE | Attending: Cardiology | Admitting: Cardiology

## 2021-10-23 ENCOUNTER — Encounter: Payer: Self-pay | Admitting: Cardiology

## 2021-10-23 VITALS — BP 132/76 | HR 83 | Ht 69.0 in | Wt 210.4 lb

## 2021-10-23 DIAGNOSIS — I5022 Chronic systolic (congestive) heart failure: Secondary | ICD-10-CM

## 2021-10-23 DIAGNOSIS — I340 Nonrheumatic mitral (valve) insufficiency: Secondary | ICD-10-CM

## 2021-10-23 DIAGNOSIS — E1169 Type 2 diabetes mellitus with other specified complication: Secondary | ICD-10-CM | POA: Diagnosis not present

## 2021-10-23 DIAGNOSIS — I428 Other cardiomyopathies: Secondary | ICD-10-CM | POA: Diagnosis not present

## 2021-10-23 DIAGNOSIS — E785 Hyperlipidemia, unspecified: Secondary | ICD-10-CM

## 2021-10-23 DIAGNOSIS — I10 Essential (primary) hypertension: Secondary | ICD-10-CM | POA: Diagnosis not present

## 2021-10-23 NOTE — Patient Instructions (Signed)
  Follow-Up: At [facility], you and your health needs are our priority.  As part of our continuing mission to provide you with exceptional heart care, we have created designated Provider Care Teams.  These Care Teams include your primary Cardiologist (physician) and Advanced Practice Providers (APPs -  Physician Assistants and Nurse Practitioners) who all work together to provide you with the care you need, when you need it.  We recommend signing up for the patient portal called "MyChart".  Sign up information is provided on this After Visit Summary.  MyChart is used to connect with patients for Virtual Visits (Telemedicine).  Patients are able to view lab/test results, encounter notes, upcoming appointments, etc.  Non-urgent messages can be sent to your provider as well.   To learn more about what you can do with MyChart, go to [email or web address].    Your next appointment:   6 month(s)  The format for your next appointment:   In Person  Provider:  Brian Crenshaw MD     

## 2021-10-24 LAB — HEPATIC FUNCTION PANEL
ALT: 14 IU/L (ref 0–32)
AST: 11 IU/L (ref 0–40)
Albumin: 4.5 g/dL (ref 3.8–4.9)
Alkaline Phosphatase: 99 IU/L (ref 44–121)
Bilirubin Total: 0.6 mg/dL (ref 0.0–1.2)
Bilirubin, Direct: 0.14 mg/dL (ref 0.00–0.40)
Total Protein: 7 g/dL (ref 6.0–8.5)

## 2021-10-24 LAB — LIPID PANEL
Chol/HDL Ratio: 3.4 ratio (ref 0.0–4.4)
Cholesterol, Total: 177 mg/dL (ref 100–199)
HDL: 52 mg/dL (ref >39)
LDL Chol Calc (NIH): 109 mg/dL — ABNORMAL HIGH (ref 0–99)
Triglycerides: 88 mg/dL (ref 0–149)
VLDL Cholesterol Cal: 16 mg/dL (ref 5–40)

## 2021-10-30 ENCOUNTER — Telehealth: Payer: Self-pay | Admitting: Cardiology

## 2021-10-30 ENCOUNTER — Other Ambulatory Visit: Payer: Self-pay | Admitting: *Deleted

## 2021-10-30 ENCOUNTER — Other Ambulatory Visit: Payer: Self-pay

## 2021-10-30 DIAGNOSIS — E785 Hyperlipidemia, unspecified: Secondary | ICD-10-CM

## 2021-10-30 MED ORDER — EZETIMIBE 10 MG PO TABS: 10.0000 mg | ORAL_TABLET | Freq: Every day | ORAL | 3 refills | Status: DC

## 2021-10-30 NOTE — Telephone Encounter (Signed)
Called patient, gave lab results.   Patient aware to start Zetia 10 mg - sent to pharmacy.   Labs ordered for 8 weeks. Patient aware to come back to get these completed.   Thanks!

## 2021-10-30 NOTE — Telephone Encounter (Signed)
Patient returned RN's call. 

## 2021-11-12 DIAGNOSIS — Z419 Encounter for procedure for purposes other than remedying health state, unspecified: Secondary | ICD-10-CM | POA: Diagnosis not present

## 2021-12-13 DIAGNOSIS — Z419 Encounter for procedure for purposes other than remedying health state, unspecified: Secondary | ICD-10-CM | POA: Diagnosis not present

## 2021-12-20 LAB — LIPID PANEL
Chol/HDL Ratio: 4.3 ratio (ref 0.0–4.4)
Cholesterol, Total: 207 mg/dL — ABNORMAL HIGH (ref 100–199)
HDL: 48 mg/dL (ref >39)
LDL Chol Calc (NIH): 145 mg/dL — ABNORMAL HIGH (ref 0–99)
Triglycerides: 76 mg/dL (ref 0–149)
VLDL Cholesterol Cal: 14 mg/dL (ref 5–40)

## 2021-12-20 LAB — HEPATIC FUNCTION PANEL
ALT: 15 IU/L (ref 0–32)
AST: 15 IU/L (ref 0–40)
Albumin: 4.3 g/dL (ref 3.8–4.9)
Alkaline Phosphatase: 90 IU/L (ref 44–121)
Bilirubin Total: 0.9 mg/dL (ref 0.0–1.2)
Bilirubin, Direct: 0.18 mg/dL (ref 0.00–0.40)
Total Protein: 6.7 g/dL (ref 6.0–8.5)

## 2021-12-26 ENCOUNTER — Telehealth: Payer: Self-pay | Admitting: *Deleted

## 2021-12-26 NOTE — Telephone Encounter (Signed)
-----   Message from Lewayne Bunting, MD sent at 12/20/2021  7:27 AM EST ----- Add zetia 10 mg daily; lipids and liver 8 weeks Olga Millers

## 2022-01-01 ENCOUNTER — Other Ambulatory Visit: Payer: Self-pay | Admitting: *Deleted

## 2022-01-01 DIAGNOSIS — E785 Hyperlipidemia, unspecified: Secondary | ICD-10-CM

## 2022-01-12 DIAGNOSIS — Z419 Encounter for procedure for purposes other than remedying health state, unspecified: Secondary | ICD-10-CM | POA: Diagnosis not present

## 2022-01-15 ENCOUNTER — Telehealth: Payer: Self-pay | Admitting: Cardiology

## 2022-01-15 MED ORDER — EZETIMIBE 10 MG PO TABS: 10.0000 mg | ORAL_TABLET | Freq: Every day | ORAL | 3 refills | Status: DC

## 2022-01-15 NOTE — Telephone Encounter (Signed)
Pt c/o medication issue:  1. Name of Medication: a cholesterol medication was supposed to be added  2. How are you currently taking this medication (dosage and times per day)? Was not received by pharmacy  3. Are you having a reaction (difficulty breathing--STAT)? no  4. What is your medication issue? Patient states her lab results said she needed a cholesterol medication, but her pharmacy has not received anything.

## 2022-01-15 NOTE — Telephone Encounter (Signed)
Spoke to patient stated she never received Zetia.Refill sent to pharmacy.She will have fasting lipid and hepatic panels in 8 weeks.

## 2022-01-24 ENCOUNTER — Other Ambulatory Visit: Payer: Self-pay | Admitting: Internal Medicine

## 2022-01-24 DIAGNOSIS — E1169 Type 2 diabetes mellitus with other specified complication: Secondary | ICD-10-CM

## 2022-01-24 MED ORDER — ROSUVASTATIN CALCIUM 40 MG PO TABS: 40.0000 mg | ORAL_TABLET | Freq: Every day | ORAL | 1 refills | Status: DC

## 2022-02-12 DIAGNOSIS — Z419 Encounter for procedure for purposes other than remedying health state, unspecified: Secondary | ICD-10-CM | POA: Diagnosis not present

## 2022-02-26 ENCOUNTER — Encounter: Payer: Self-pay | Admitting: Internal Medicine

## 2022-02-26 ENCOUNTER — Ambulatory Visit: Payer: PRIVATE HEALTH INSURANCE | Attending: Internal Medicine | Admitting: Internal Medicine

## 2022-02-26 VITALS — BP 130/68 | HR 77 | Ht 69.0 in | Wt 214.2 lb

## 2022-02-26 DIAGNOSIS — E1142 Type 2 diabetes mellitus with diabetic polyneuropathy: Secondary | ICD-10-CM | POA: Diagnosis not present

## 2022-02-26 DIAGNOSIS — I5042 Chronic combined systolic (congestive) and diastolic (congestive) heart failure: Secondary | ICD-10-CM

## 2022-02-26 DIAGNOSIS — Z1231 Encounter for screening mammogram for malignant neoplasm of breast: Secondary | ICD-10-CM

## 2022-02-26 DIAGNOSIS — E669 Obesity, unspecified: Secondary | ICD-10-CM | POA: Diagnosis not present

## 2022-02-26 DIAGNOSIS — E1159 Type 2 diabetes mellitus with other circulatory complications: Secondary | ICD-10-CM

## 2022-02-26 DIAGNOSIS — N1831 Chronic kidney disease, stage 3a: Secondary | ICD-10-CM | POA: Diagnosis not present

## 2022-02-26 DIAGNOSIS — Z23 Encounter for immunization: Secondary | ICD-10-CM | POA: Diagnosis not present

## 2022-02-26 DIAGNOSIS — I152 Hypertension secondary to endocrine disorders: Secondary | ICD-10-CM

## 2022-02-26 DIAGNOSIS — E785 Hyperlipidemia, unspecified: Secondary | ICD-10-CM

## 2022-02-26 DIAGNOSIS — E1169 Type 2 diabetes mellitus with other specified complication: Secondary | ICD-10-CM | POA: Diagnosis not present

## 2022-02-26 LAB — GLUCOSE, POCT (MANUAL RESULT ENTRY): POC Glucose: 207 mg/dl — AB (ref 70–99)

## 2022-02-26 LAB — POCT GLYCOSYLATED HEMOGLOBIN (HGB A1C): HbA1c, POC (controlled diabetic range): 8.5 % — AB (ref 0.0–7.0)

## 2022-02-26 MED ORDER — FREESTYLE LIBRE SENSOR SYSTEM MISC: 12 refills | Status: DC

## 2022-02-26 MED ORDER — ISOSORBIDE MONONITRATE ER 30 MG PO TB24: 30.0000 mg | ORAL_TABLET | Freq: Every day | ORAL | 3 refills | Status: DC

## 2022-02-26 MED ORDER — SPIRONOLACTONE 25 MG PO TABS: 25.0000 mg | ORAL_TABLET | Freq: Every day | ORAL | 3 refills | Status: DC

## 2022-02-26 MED ORDER — GABAPENTIN 100 MG PO CAPS: 200.0000 mg | ORAL_CAPSULE | Freq: Every day | ORAL | 2 refills | Status: DC

## 2022-02-26 MED ORDER — EZETIMIBE 10 MG PO TABS: 10.0000 mg | ORAL_TABLET | Freq: Every day | ORAL | 3 refills | Status: DC

## 2022-02-26 MED ORDER — METFORMIN HCL ER 500 MG PO TB24: 500.0000 mg | ORAL_TABLET | Freq: Every day | ORAL | 1 refills | Status: DC

## 2022-02-26 MED ORDER — FREESTYLE LIBRE READER DEVI: 0 refills | Status: DC

## 2022-02-26 MED ORDER — FUROSEMIDE 40 MG PO TABS: ORAL_TABLET | ORAL | 1 refills | Status: DC

## 2022-02-26 MED ORDER — ROSUVASTATIN CALCIUM 40 MG PO TABS: 40.0000 mg | ORAL_TABLET | Freq: Every day | ORAL | 1 refills | Status: DC

## 2022-02-26 MED ORDER — TRULICITY 0.75 MG/0.5ML ~~LOC~~ SOAJ: 0.7500 mg | SUBCUTANEOUS | 6 refills | Status: DC

## 2022-02-26 MED ORDER — HYDRALAZINE HCL 50 MG PO TABS: 50.0000 mg | ORAL_TABLET | Freq: Three times a day (TID) | ORAL | 3 refills | Status: DC

## 2022-02-26 MED ORDER — CARVEDILOL 25 MG PO TABS: 25.0000 mg | ORAL_TABLET | Freq: Two times a day (BID) | ORAL | 3 refills | Status: DC

## 2022-02-26 NOTE — Progress Notes (Signed)
Patient ID: [name], female    DOB: December 29, 1965  MRN: [id number]  CC: Diabetes and Medication Refill   Subjective: [name] is a 57 y.o. female who presents for chronic ds management Her concerns today include:  DM with neuropathy, HL, HTN, DCM/sys and diastolic CHF with EF35-40% on 05/979, lung nodules resolved, hx of LT common carotid dissection, CKD 2-3, mild anemia,  vit D def, anxiety/depression, mild intermittent asthma.   Pt did not bring med bottles but has printed list with her.Marland Kitchen HM: She is due for flu vaccine, second Shingrix vaccine and mammogram.  Also due for Pap smear.  DM: Results for orders placed or performed in visit on 02/26/22  POCT glucose (manual entry)  Result Value Ref Range   POC Glucose 207 (A) 70 - 99 mg/dl  POCT glycosylated hemoglobin (Hb A1C)  Result Value Ref Range   Hemoglobin A1C     HbA1c POC (<> result, manual entry)     HbA1c, POC (prediabetic range)     HbA1c, POC (controlled diabetic range) 8.5 (A) 0.0 - 7.0 %  Should be on Trulicity 0.75 mg/wk, Lantus insulin 38 units daily (tells me she takes 40 units/day) and Metformin XR 500 mg daily.  Told me she got Trulicity one time then had to go back to Victoza 1.8 mg daily; not sure why.  I spoke with the clinical pharmacist today and he told me that she likely needs prior approval. -never received Dexcom device.  Lancets not working for manual check so she has not been checking blood sugars. -dietary indiscretions during the holidays and stress eating -walks daily at the park for 30 mins   HTN/HL/CHF:  should be on Crestor 40 mg.  Rxn sent on last visit.  She tells me she is taking it.  Saw cardiology 2 mths ago and LDL was 145.  Zetia added.  Reports compliance with medication.  Other medications that she says yes to taking are carvedilol 25 mg twice a day, spironolactone 25 mg daily, isosorbide 30 mg daily, hydralazine 50 mg 3 times a day. NO CP/SOB/LE edema/HA/dizziness  CKD 3a:  Last GFR in February of last year was 57 with creatinine of 1.12.  Prior to that, GFR was in the 60s.   Patient Active Problem List   Diagnosis Date Noted   NPDR (nonproliferative diabetic retinopathy) (HCC) 10/04/2021   Acute cough 04/03/2021   Upper respiratory tract infection 04/03/2021   Anxiety and depression 01/17/2021   Sinus tachycardia 10/15/2018   Vitamin D deficiency 03/26/2018   Homeless 03/24/2018   Microalbuminuria 08/11/2017   Anemia 08/08/2017   Physical deconditioning    Hyperlipidemia 02/21/2017   Diabetic polyneuropathy associated with type 2 diabetes mellitus (HCC) 02/21/2017   Lung nodules 07/16/2016   Uncontrolled type 2 diabetes mellitus without complication, with long-term current use of insulin 07/16/2016   DCM (dilated cardiomyopathy) (HCC)    Acute on chronic combined systolic and diastolic CHF (congestive heart failure) (HCC) 06/10/2016   Essential hypertension 06/10/2016   Dyslipidemia associated with type 2 diabetes mellitus (HCC) 06/10/2016     Current Outpatient Medications on File Prior to Visit  Medication Sig Dispense Refill   Accu-Chek Softclix Lancets lancets Use to check blood sugar up to 3 times daily. E11.42 100 each 2   albuterol (PROVENTIL) (2.5 MG/3ML) 0.083% nebulizer solution USE 3 MILLILITERS IN NEBULIZER EVERY 6 HOURS AS NEEDED FOR WHEEZING FOR SHORTNESS OF BREATH 75 mL 0   albuterol (VENTOLIN HFA) 108 (90  Base) MCG/ACT inhaler Inhale 2 puffs into the lungs every 6 (six) hours as needed for wheezing or shortness of breath. 18 g 6   aspirin 81 MG chewable tablet Chew 1 tablet (81 mg total) by mouth daily. 30 tablet 3   blood glucose meter kit and supplies Dispense based on patient and insurance preference. Use up to four times daily as directed. (FOR ICD-9 250.00, 250.01). 1 each 0   Blood Glucose Monitoring Suppl (ACCU-CHEK GUIDE ME) w/Device KIT 1 kit by Does not apply route 3 (three) times daily. Use to check blood sugar up to 3 times  daily. 1 kit 0   DULoxetine (CYMBALTA) 20 MG capsule Take 1 capsule (20 mg total) by mouth daily. 30 capsule 5   glucose blood (ACCU-CHEK GUIDE) test strip Use as instructed to check blood sugar up to 3 times daily. 100 each 11   LANTUS SOLOSTAR 100 UNIT/ML Solostar Pen Inject 38 Units into the skin daily. 45 mL 3   TRUEPLUS PEN NEEDLES 31G X 6 MM MISC USE AS DIRECTED 100 each 1   Cholecalciferol (VITAMIN D3) 10 MCG (400 UNIT) tablet Take 1 tablet (400 Units total) by mouth daily. (Patient not taking: Reported on 10/23/2021) 100 tablet 1   Continuous Blood Gluc Transmit (DEXCOM G6 TRANSMITTER) MISC 1 packet by Does not apply route daily. (Patient not taking: Reported on 10/23/2021) 1 each 4   loratadine (CLARITIN) 10 MG tablet Take 1 tablet (10 mg total) by mouth daily. (Patient not taking: Reported on 02/26/2022) 30 tablet 0   No current facility-administered medications on file prior to visit.    Allergies  Allergen Reactions   Lisinopril Hives and Swelling    Facial    Sertraline Hives and Swelling    Facial    Tramadol Hives and Swelling    facial   Ibuprofen Hives and Swelling    Social History   Socioeconomic History   Marital status: Divorced    Spouse name: Not on file   Number of children: 2   Years of education: Not on file   Highest education level: Not on file  Occupational History   Not on file  Tobacco Use   Smoking status: Never   Smokeless tobacco: Never  Vaping Use   Vaping Use: Never used  Substance and Sexual Activity   Alcohol use: No   Drug use: No   Sexual activity: Yes    Birth control/protection: None  Other Topics Concern   Not on file  Social History Narrative   Lives in [place].  Presently unemployed but attends school full time   Social Determinants of Radio broadcast assistant Strain: Not on file  Food Insecurity: Not on file  Transportation Needs: Not on file  Physical Activity: Not on file  Stress: Not on file  Social  Connections: Not on file  Intimate Partner Violence: Not on file    Family History  Problem Relation Age of Onset   Diabetes Other        multiple   Heart failure Paternal Grandmother    Breast cancer Paternal Grandmother    Heart disease Other        multiple   Breast cancer Mother     Past Surgical History:  Procedure Laterality Date   CERVIX LESION DESTRUCTION     DILATION AND CURETTAGE OF UTERUS     LEFT HEART CATHETERIZATION WITH CORONARY ANGIOGRAM N/A 05/21/2011   Procedure: LEFT HEART CATHETERIZATION WITH CORONARY ANGIOGRAM;  Surgeon: Tonny Bollman, MD;  Location: [facility] CATH LAB;  Service: Cardiovascular;  Laterality: N/A;   TUBAL LIGATION  1996    ROS: Review of Systems Ear:  ears feel clogged at times x 1 wk  PHYSICAL EXAM: BP 130/68 (BP Location: Left Arm, Patient Position: Sitting, Cuff Size: Normal)   Pulse 77   Ht 5\' 9"  (1.753 m)   Wt 214 lb 3.2 oz (97.2 kg)   SpO2 100%   BMI 31.63 kg/m   Wt Readings from Last 3 Encounters:  02/26/22 214 lb 3.2 oz (97.2 kg)  10/23/21 210 lb 6.4 oz (95.4 kg)  07/21/21 207 lb 9.6 oz (94.2 kg)    Physical Exam  General appearance - alert, well appearing, middle-age African-American female and in no distress Mental status - normal mood, behavior, speech, dress, motor activity, and thought processes Neck - supple, no significant adenopathy Chest - clear to auscultation, no wheezes, rales or rhonchi, symmetric air entry Heart - normal rate, regular rhythm, normal S1, S2, no murmurs, rubs, clicks or gallops Extremities - peripheral pulses normal, no pedal edema, no clubbing or cyanosis Diabetic Foot Exam - Simple   Simple Foot Form Diabetic Foot exam was performed with the following findings: Yes 02/26/2022 12:45 PM  Visual Inspection See comments: Yes Sensation Testing Intact to touch and monofilament testing bilaterally: Yes Pulse Check Posterior Tibialis and Dorsalis pulse intact bilaterally: Yes Comments Toenails are  thick.         Latest Ref Rng & Units 12/19/2021    8:43 AM 10/23/2021    3:53 PM 03/21/2021    4:04 PM  CMP  Glucose 70 - 99 mg/dL   05/19/2021   BUN 6 - 24 mg/dL   22   Creatinine 295 - 1.00 mg/dL   1.88   Sodium 4.16 - 606 mmol/L   140   Potassium 3.5 - 5.2 mmol/L   4.2   Chloride 96 - 106 mmol/L   102   CO2 20 - 29 mmol/L   24   Calcium 8.7 - 10.2 mg/dL   301   Total Protein 6.0 - 8.5 g/dL 6.7  7.0  7.4   Total Bilirubin 0.0 - 1.2 mg/dL 0.9  0.6  0.6   Alkaline Phos 44 - 121 IU/L 90  99  82   AST 0 - 40 IU/L 15  11  16    ALT 0 - 32 IU/L 15  14  13     Lipid Panel     Component Value Date/Time   CHOL 207 (H) 12/19/2021 0843   TRIG 76 12/19/2021 0843   HDL 48 12/19/2021 0843   CHOLHDL 4.3 12/19/2021 0843   CHOLHDL 6.4 (H) 05/05/2015 1645   VLDL 37 (H) 05/05/2015 1645   LDLCALC 145 (H) 12/19/2021 0843    CBC    Component Value Date/Time   WBC 8.6 03/21/2021 1604   WBC 8.9 07/25/2017 0837   RBC 4.52 03/21/2021 1604   RBC 3.71 (L) 07/25/2017 0837   HGB 12.7 03/21/2021 1604   HCT 39.1 03/21/2021 1604   PLT 311 03/21/2021 1604   MCV 87 03/21/2021 1604   MCH 28.1 03/21/2021 1604   MCH 26.4 07/25/2017 0837   MCHC 32.5 03/21/2021 1604   MCHC 30.6 07/25/2017 0837   RDW 14.1 03/21/2021 1604   LYMPHSABS 2.5 07/31/2017 1225   MONOABS 0.6 06/02/2017 0530   EOSABS 0.2 07/31/2017 1225   BASOSABS 0.1 07/31/2017 1225    ASSESSMENT AND PLAN:  1.  Type 2 diabetes mellitus with obesity ([place]) Not at goal. Discussed changing Victoza to Trulicity again.  She is agreeable to this.  Clinical pharmacist sent a message to our pharmacy tech so that she can work on getting prior approval for Trulicity and continuous glucose monitor.  I went over possible side effects again of Trulicity with her including nausea, vomiting, diarrhea, palpitations, bowel blockage and pancreatitis.  Advised that if she develops any significant diarrhea, any vomiting or pain in the upper abdomen and unable to pass  bowel, she should stop the medication and give [place] a call. Seen by clinical pharmacist today to be shown Trulicity injection - POCT glucose (manual entry) - POCT glycosylated hemoglobin (Hb A1C) - Microalbumin / creatinine urine ratio - CBC - Basic metabolic panel - Continuous Blood Gluc Receiver (FREESTYLE LIBRE READER) DEVI; UAD.  E11.69, E66.9  Dispense: 1 each; Refill: 0 - gabapentin (NEURONTIN) 100 MG capsule; Take 2 capsules (200 mg total) by mouth at bedtime. For diabetic neuropathy symptoms  Dispense: 180 capsule; Refill: 2 - metFORMIN (GLUCOPHAGE-XR) 500 MG 24 hr tablet; Take 1 tablet (500 mg total) by mouth daily with breakfast.  Dispense: 90 tablet; Refill: 1 - Dulaglutide (TRULICITY) 9.02 IO/9.7DZ SOPN; Inject 0.75 mg into the skin once a week. E11.69/E66.9  Dispense: 2 mL; Refill: 6 - Continuous Blood Gluc Sensor (FREESTYLE LIBRE SENSOR SYSTEM) MISC; Change sensor Q 2 wks.  E11.69/E66.9  Dispense: 2 each; Refill: 12  2. Hypertension associated with diabetes ([place]) At goal.  Continue carvedilol, hydralazine, isosorbide - carvedilol (COREG) 25 MG tablet; Take 1 tablet (25 mg total) by mouth 2 (two) times daily with a meal.  Dispense: 180 tablet; Refill: 3 - hydrALAZINE (APRESOLINE) 50 MG tablet; Take 1 tablet (50 mg total) by mouth 3 (three) times daily.  Dispense: 270 tablet; Refill: 3 - isosorbide mononitrate (IMDUR) 30 MG 24 hr tablet; Take 1 tablet (30 mg total) by mouth daily.  Dispense: 90 tablet; Refill: 3  3. Dyslipidemia associated with type 2 diabetes mellitus (HCC) Continue Zetia and Crestor.  Check lipid profile on subsequent visit. - ezetimibe (ZETIA) 10 MG tablet; Take 1 tablet (10 mg total) by mouth daily.  Dispense: 90 tablet; Refill: 3 - rosuvastatin (CRESTOR) 40 MG tablet; Take 1 tablet (40 mg total) by mouth daily.  Dispense: 30 tablet; Refill: 1  4. Chronic combined systolic and diastolic CHF, NYHA class 1 (HCC) Stable and compensated.  Continue Coreg,  hydralazine, isosorbide and spironolactone. - carvedilol (COREG) 25 MG tablet; Take 1 tablet (25 mg total) by mouth 2 (two) times daily with a meal.  Dispense: 180 tablet; Refill: 3 - furosemide (LASIX) 40 MG tablet; TAKE 1 TABLET BY MOUTH ONCE DAILY FOR  FLUID/HEART  FAILURE  Dispense: 90 tablet; Refill: 1 - hydrALAZINE (APRESOLINE) 50 MG tablet; Take 1 tablet (50 mg total) by mouth 3 (three) times daily.  Dispense: 270 tablet; Refill: 3 - isosorbide mononitrate (IMDUR) 30 MG 24 hr tablet; Take 1 tablet (30 mg total) by mouth daily.  Dispense: 90 tablet; Refill: 3 - spironolactone (ALDACTONE) 25 MG tablet; Take 1 tablet (25 mg total) by mouth daily.  Dispense: 90 tablet; Refill: 3  5. Stage 3a chronic kidney disease (HCC) Check BMP today.  Avoid NSAIDs.  6. Encounter for screening mammogram for malignant neoplasm of breast Mammogram ordered.  7. Need for immunization against influenza - Flu Vaccine QUAD 47mo+IM (Fluarix, Fluzone & Alfiuria Quad PF)  8. Need for shingles vaccine Given second Shingrix today.  Patient was given the opportunity to ask questions.  Patient verbalized understanding of the plan and was able to repeat key elements of the plan.   This documentation was completed using Radio producer.  Any transcriptional errors are unintentional.  Orders Placed This Encounter  Procedures   Flu Vaccine QUAD 84mo+IM (Fluarix, Fluzone & Alfiuria Quad PF)   Varicella-zoster vaccine IM   Microalbumin / creatinine urine ratio   CBC   Basic metabolic panel   POCT glucose (manual entry)   POCT glycosylated hemoglobin (Hb A1C)     Requested Prescriptions   Signed Prescriptions Disp Refills   Continuous Blood Gluc Receiver (FREESTYLE LIBRE READER) DEVI 1 each 0    Sig: UAD.  E11.69, E66.9   carvedilol (COREG) 25 MG tablet 180 tablet 3    Sig: Take 1 tablet (25 mg total) by mouth 2 (two) times daily with a meal.   ezetimibe (ZETIA) 10 MG tablet 90 tablet  3    Sig: Take 1 tablet (10 mg total) by mouth daily.   furosemide (LASIX) 40 MG tablet 90 tablet 1    Sig: TAKE 1 TABLET BY MOUTH ONCE DAILY FOR  FLUID/HEART  FAILURE   gabapentin (NEURONTIN) 100 MG capsule 180 capsule 2    Sig: Take 2 capsules (200 mg total) by mouth at bedtime. For diabetic neuropathy symptoms   hydrALAZINE (APRESOLINE) 50 MG tablet 270 tablet 3    Sig: Take 1 tablet (50 mg total) by mouth 3 (three) times daily.   isosorbide mononitrate (IMDUR) 30 MG 24 hr tablet 90 tablet 3    Sig: Take 1 tablet (30 mg total) by mouth daily.   metFORMIN (GLUCOPHAGE-XR) 500 MG 24 hr tablet 90 tablet 1    Sig: Take 1 tablet (500 mg total) by mouth daily with breakfast.   rosuvastatin (CRESTOR) 40 MG tablet 30 tablet 1    Sig: Take 1 tablet (40 mg total) by mouth daily.   spironolactone (ALDACTONE) 25 MG tablet 90 tablet 3    Sig: Take 1 tablet (25 mg total) by mouth daily.   Dulaglutide (TRULICITY) 0.27 XA/1.2IN SOPN 2 mL 6    Sig: Inject 0.75 mg into the skin once a week. E11.69/E66.9   Continuous Blood Gluc Sensor (FREESTYLE LIBRE SENSOR SYSTEM) MISC 2 each 12    Sig: Change sensor Q 2 wks.  E11.69/E66.9    Return in about 5 weeks (around 04/02/2022) for PAP.  Karle Plumber, MD, FACP

## 2022-02-27 LAB — CBC
Hematocrit: 39.3 % (ref 34.0–46.6)
Hemoglobin: 12.4 g/dL (ref 11.1–15.9)
MCH: 27.4 pg (ref 26.6–33.0)
MCHC: 31.6 g/dL (ref 31.5–35.7)
MCV: 87 fL (ref 79–97)
Platelets: 208 10*3/uL (ref 150–450)
RBC: 4.52 x10E6/uL (ref 3.77–5.28)
RDW: 13.1 % (ref 11.7–15.4)
WBC: 6.9 10*3/uL (ref 3.4–10.8)

## 2022-02-27 LAB — MICROALBUMIN / CREATININE URINE RATIO
Creatinine, Urine: 79.7 mg/dL
Microalb/Creat Ratio: 8 mg/g creat (ref 0–29)
Microalbumin, Urine: 6.3 ug/mL

## 2022-02-27 LAB — BASIC METABOLIC PANEL
BUN/Creatinine Ratio: 16 (ref 9–23)
BUN: 19 mg/dL (ref 6–24)
CO2: 25 mmol/L (ref 20–29)
Calcium: 9.7 mg/dL (ref 8.7–10.2)
Chloride: 97 mmol/L (ref 96–106)
Creatinine, Ser: 1.16 mg/dL — ABNORMAL HIGH (ref 0.57–1.00)
Glucose: 287 mg/dL — ABNORMAL HIGH (ref 70–99)
Potassium: 4.6 mmol/L (ref 3.5–5.2)
Sodium: 138 mmol/L (ref 134–144)
eGFR: 56 mL/min/{1.73_m2} — ABNORMAL LOW (ref >59)

## 2022-03-15 DIAGNOSIS — Z419 Encounter for procedure for purposes other than remedying health state, unspecified: Secondary | ICD-10-CM | POA: Diagnosis not present

## 2022-03-16 ENCOUNTER — Other Ambulatory Visit: Payer: Self-pay | Admitting: Internal Medicine

## 2022-03-16 ENCOUNTER — Telehealth: Payer: Self-pay | Admitting: Internal Medicine

## 2022-03-16 DIAGNOSIS — E669 Obesity, unspecified: Secondary | ICD-10-CM

## 2022-03-16 DIAGNOSIS — Z23 Encounter for immunization: Secondary | ICD-10-CM

## 2022-03-16 DIAGNOSIS — E1169 Type 2 diabetes mellitus with other specified complication: Secondary | ICD-10-CM

## 2022-03-16 DIAGNOSIS — Z1231 Encounter for screening mammogram for malignant neoplasm of breast: Secondary | ICD-10-CM

## 2022-03-16 DIAGNOSIS — E1159 Type 2 diabetes mellitus with other circulatory complications: Secondary | ICD-10-CM

## 2022-03-16 DIAGNOSIS — N1831 Chronic kidney disease, stage 3a: Secondary | ICD-10-CM

## 2022-03-16 DIAGNOSIS — I5042 Chronic combined systolic (congestive) and diastolic (congestive) heart failure: Secondary | ICD-10-CM

## 2022-03-16 DIAGNOSIS — I152 Hypertension secondary to endocrine disorders: Secondary | ICD-10-CM

## 2022-03-16 NOTE — Telephone Encounter (Signed)
Routing to PCP for review.

## 2022-03-16 NOTE — Telephone Encounter (Signed)
Called & spoke to the patient. Verified name & DOB. Asked patient if she has taken Oxycodone (Percocet) or Hydrocodone (Vidcodine) and patient stated she is unsure. Informed patient to take Tylenol 650 mg per Dr.Johnson instead. Patient expressed verbal understanding.

## 2022-03-16 NOTE — Telephone Encounter (Signed)
Pt is having a tooth extracted on Monday and was prescribed and antibiotic and Tramadol by the dentist/ pt is allergic to tramadol and they advised her to reach out to Dr. Wynetta Emery to see what pain med she can take and if this can be prescribed and sent into the pharmacy today by Dr. Wynetta Emery

## 2022-03-29 ENCOUNTER — Encounter: Payer: Self-pay | Admitting: *Deleted

## 2022-04-13 DIAGNOSIS — Z419 Encounter for procedure for purposes other than remedying health state, unspecified: Secondary | ICD-10-CM | POA: Diagnosis not present

## 2022-04-19 NOTE — Progress Notes (Signed)
HPI: FU CM/CHF. Echocardiogram in April of 2013 showed an ejection fraction of 35%, moderate left ventricular enlargement, mild to moderate mitral regurgitation. HIV negative and TSH normal. Cardiac catheterization in April of 2013 showed normal coronary arteries and an ejection fraction of 35-40%. Patient was treated with medications and her cardiomyopathy was felt possibly secondary to hypertension. Echocardiogram repeated in September of 2013. Her ejection fraction was 30-35%. Patient referred to Dr. Rayann Meyer for consideration of ICD but the patient decided not to pursue. Also with h/o carotid dissection healed on CTA 4/18. Follow-up echocardiogram June 2023 showed ejection fraction 45 to 50%, mild to moderate left ventricular enlargement, moderate mitral regurgitation.  Since last seen, she denies dyspnea on exertion, orthopnea, PND, pedal edema, chest pain, palpitations or syncope.  Current Outpatient Medications  Medication Sig Dispense Refill   Accu-Chek Softclix Lancets lancets Use to check blood sugar up to 3 times daily. E11.42 100 each 2   albuterol (PROVENTIL) (2.5 MG/3ML) 0.083% nebulizer solution USE 3 MILLILITERS IN NEBULIZER EVERY 6 HOURS AS NEEDED FOR WHEEZING FOR SHORTNESS OF BREATH 75 mL 0   albuterol (VENTOLIN HFA) 108 (90 Base) MCG/ACT inhaler Inhale 2 puffs into the lungs every 6 (six) hours as needed for wheezing or shortness of breath. 18 g 6   aspirin 81 MG chewable tablet Chew 1 tablet (81 mg total) by mouth daily. 30 tablet 3   blood glucose meter kit and supplies Dispense based on patient and insurance preference. Use up to four times daily as directed. (FOR ICD-9 250.00, 250.01). 1 each 0   Blood Glucose Monitoring Suppl (ACCU-CHEK GUIDE ME) w/Device KIT 1 kit by Does not apply route 3 (three) times daily. Use to check blood sugar up to 3 times daily. 1 kit 0   carvedilol (COREG) 25 MG tablet Take 1 tablet (25 mg total) by mouth 2 (two) times daily with a meal. 180 tablet  3   Cholecalciferol (VITAMIN D3) 10 MCG (400 UNIT) tablet Take 1 tablet (400 Units total) by mouth daily. 100 tablet 1   Continuous Blood Gluc Transmit (DEXCOM G6 TRANSMITTER) MISC 1 packet by Does not apply route daily. 1 each 4   Dulaglutide (TRULICITY) 7.34 KA/7.6OT SOPN Inject 0.75 mg into the skin once a week. E11.69/E66.9 2 mL 6   DULoxetine (CYMBALTA) 20 MG capsule Take 1 capsule (20 mg total) by mouth daily. 30 capsule 5   ezetimibe (ZETIA) 10 MG tablet Take 1 tablet (10 mg total) by mouth daily. 90 tablet 3   furosemide (LASIX) 40 MG tablet TAKE 1 TABLET BY MOUTH ONCE DAILY FOR  FLUID/HEART  FAILURE 90 tablet 1   gabapentin (NEURONTIN) 100 MG capsule Take 2 capsules (200 mg total) by mouth at bedtime. For diabetic neuropathy symptoms 180 capsule 2   glucose blood (ACCU-CHEK GUIDE) test strip Use as instructed to check blood sugar up to 3 times daily. 100 each 11   hydrALAZINE (APRESOLINE) 50 MG tablet Take 1 tablet (50 mg total) by mouth 3 (three) times daily. 270 tablet 3   isosorbide mononitrate (IMDUR) 30 MG 24 hr tablet Take 1 tablet (30 mg total) by mouth daily. 90 tablet 3   LANTUS SOLOSTAR 100 UNIT/ML Solostar Pen Inject 38 Units into the skin daily. 45 mL 3   metFORMIN (GLUCOPHAGE-XR) 500 MG 24 hr tablet Take 1 tablet (500 mg total) by mouth daily with breakfast. 90 tablet 1   spironolactone (ALDACTONE) 25 MG tablet Take 1 tablet (25 mg total)  by mouth daily. 90 tablet 3   TRUEPLUS PEN NEEDLES 31G X 6 MM MISC USE AS DIRECTED 100 each 1   No current facility-administered medications for this visit.     Past Medical History:  Diagnosis Date   Asthma 05/31/2017   Cataracts, bilateral    Age-related   Diabetes mellitus, type 2 ([place])    A1C 7.6 April '13   Hypertension    Hypertensive retinopathy    Left leg pain 07/21/2015   Nonischemic cardiomyopathy ([place])    Echo 05/19/11 EF 35% w/ mild-mod MR; R/L Heart Cath 05/21/11 - mildly elevated right heart pressures, normal left  heart pressures, preserved cardiac output, widely patent coronary arteries without significant CAD and moderate global systolic LV dysfunction, EF 35-40%.   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   right sided weakness, now resolved   Systolic CHF ([place])    Echo 05/19/11 EF 35% w/ mild-mod MR    Past Surgical History:  Procedure Laterality Date   CERVIX LESION DESTRUCTION     DILATION AND CURETTAGE OF UTERUS     LEFT HEART CATHETERIZATION WITH CORONARY ANGIOGRAM N/A 05/21/2011   Procedure: LEFT HEART CATHETERIZATION WITH CORONARY ANGIOGRAM;  Surgeon: Sherren Mocha, MD;  Location[facility] CATH LAB;  Service: Cardiovascular;  Laterality: N/A;   TUBAL LIGATION  1996    Social History   Socioeconomic History   Marital status: Divorced    Spouse name: Not on file   Number of children: 2   Years of education: Not on file   Highest education level: Not on file  Occupational History   Not on file  Tobacco Use   Smoking status: Never   Smokeless tobacco: Never  Vaping Use   Vaping Use: Never used  Substance and Sexual Activity   Alcohol use: No   Drug use: No   Sexual activity: Yes    Birth control/protection: None  Other Topics Concern   Not on file  Social History Narrative   Lives in [place].  Presently unemployed but attends school full time   Social Determinants of Radio broadcast assistant Strain: Not on file  Food Insecurity: Not on file  Transportation Needs: Not on file  Physical Activity: Not on file  Stress: Not on file  Social Connections: Not on file  Intimate Partner Violence: Not on file    Family History  Problem Relation Age of Onset   Diabetes Other        multiple   Heart failure Paternal Grandmother    Breast cancer Paternal Grandmother    Heart disease Other        multiple   Breast cancer Mother     ROS: no fevers or chills, productive cough, hemoptysis, dysphasia, odynophagia, melena, hematochezia, dysuria, hematuria, rash, seizure activity,  orthopnea, PND, pedal edema, claudication. Remaining systems are negative.  Physical Exam: Well-developed well-nourished in no acute distress.  Skin is warm and dry.  HEENT is normal.  Neck is supple.  Chest is clear to auscultation with normal expansion.  Cardiovascular exam is regular rate and rhythm.  Abdominal exam nontender or distended. No masses palpated. Extremities show no edema. neuro grossly intact  ECG-normal sinus rhythm at a rate of 90, nonspecific ST changes, mildly prolonged QT interval.  Personally reviewed  A/P  1.  Cardiomyopathy-most recent ejection fraction 45 to 50%.  Continue hydralazine/nitrates (previous angioedema with ACE inhibitors) and carvedilol.  2 chronic combined systolic/diastolic congestive-patient is euvolemic.  Continue diuretic at present dose.  I  have asked her to discuss SGLT2 inhibitor with primary care who manages her diabetes mellitus.  3 hypertension-patient's blood pressure is controlled.  Continue present medical regimen.  4 hyperlipidemia-continue Zetia.  Most recent LDL not at goal.  Add Crestor 40 mg daily.  Check lipids and liver in 8 weeks.  5 mitral regurgitation-plan follow-up echocardiogram June 2024.  6 status post left common carotid artery dissection-resolved on most recent CT scan.  Kirk Ruths, MD

## 2022-04-20 ENCOUNTER — Ambulatory Visit
Admission: RE | Admit: 2022-04-20 | Discharge: 2022-04-20 | Disposition: A | Discharge status: Home / Self Care | Payer: PRIVATE HEALTH INSURANCE | Source: Ambulatory Visit | Attending: Internal Medicine | Admitting: Internal Medicine

## 2022-04-20 DIAGNOSIS — Z1231 Encounter for screening mammogram for malignant neoplasm of breast: Secondary | ICD-10-CM

## 2022-04-25 ENCOUNTER — Other Ambulatory Visit: Payer: Self-pay | Admitting: Internal Medicine

## 2022-04-25 DIAGNOSIS — R928 Other abnormal and inconclusive findings on diagnostic imaging of breast: Secondary | ICD-10-CM

## 2022-04-26 ENCOUNTER — Ambulatory Visit: Payer: PRIVATE HEALTH INSURANCE | Attending: Cardiology | Admitting: Cardiology

## 2022-04-26 ENCOUNTER — Encounter: Payer: Self-pay | Admitting: Cardiology

## 2022-04-26 VITALS — BP 134/76 | HR 90 | Ht 69.0 in | Wt 216.0 lb

## 2022-04-26 DIAGNOSIS — I428 Other cardiomyopathies: Secondary | ICD-10-CM | POA: Diagnosis not present

## 2022-04-26 DIAGNOSIS — I10 Essential (primary) hypertension: Secondary | ICD-10-CM

## 2022-04-26 DIAGNOSIS — I340 Nonrheumatic mitral (valve) insufficiency: Secondary | ICD-10-CM

## 2022-04-26 DIAGNOSIS — I5022 Chronic systolic (congestive) heart failure: Secondary | ICD-10-CM | POA: Diagnosis not present

## 2022-04-26 DIAGNOSIS — E785 Hyperlipidemia, unspecified: Secondary | ICD-10-CM

## 2022-04-26 MED ORDER — ROSUVASTATIN CALCIUM 40 MG PO TABS: 40.0000 mg | ORAL_TABLET | Freq: Every day | ORAL | 3 refills | Status: DC

## 2022-04-26 NOTE — Patient Instructions (Signed)
Medication Instructions:    START ROSUVASTATIN 40 MG ONCE DAILY  *If you need a refill on your cardiac medications before your next appointment, please call your pharmacy*   Lab Work:  Your physician recommends that you return for lab work in: [place]  If you have labs (blood work) drawn today and your tests are completely normal, you will receive your results only by: [place] (if you have MyChart) OR A paper copy in the mail If you have any lab test that is abnormal or we need to change your treatment, we will call you to review the results.   Testing/Procedures:  Your physician has requested that you have an echocardiogram. Echocardiography is a painless test that uses sound waves to create images of your heart. It provides your doctor with information about the size and shape of your heart and how well your heart's chambers and valves are working. This procedure takes approximately one hour. There are no restrictions for this procedure. Please do NOT wear cologne, perfume, aftershave, or lotions (deodorant is allowed). Please arrive 15 minutes prior to your appointment time. [place]   Follow-Up: At [facility], you and your health needs are our priority.  As part of our continuing mission to provide you with exceptional heart care, we have created designated Provider Care Teams.  These Care Teams include your primary Cardiologist (physician) and Advanced Practice Providers (APPs -  Physician Assistants and Nurse Practitioners) who all work together to provide you with the care you need, when you need it.  We recommend signing up for the patient portal called "MyChart".  Sign up information is provided on this After Visit Summary.  MyChart is used to connect with patients for Virtual Visits (Telemedicine).  Patients are able to view lab/test results, encounter notes, upcoming appointments, etc.  Non-urgent messages can be sent  to your provider as well.   To learn more about what you can do with MyChart, go to [email or web address].    Your next appointment:   6 month(s)  Provider:   Kirk Ruths, MD

## 2022-05-09 ENCOUNTER — Ambulatory Visit
Admission: RE | Admit: 2022-05-09 | Discharge: 2022-05-09 | Disposition: A | Discharge status: Home / Self Care | Payer: PRIVATE HEALTH INSURANCE | Source: Ambulatory Visit | Attending: Internal Medicine | Admitting: Internal Medicine

## 2022-05-09 DIAGNOSIS — R928 Other abnormal and inconclusive findings on diagnostic imaging of breast: Secondary | ICD-10-CM

## 2022-05-10 ENCOUNTER — Ambulatory Visit: Payer: PRIVATE HEALTH INSURANCE | Attending: Internal Medicine | Admitting: Internal Medicine

## 2022-05-10 ENCOUNTER — Encounter: Payer: Self-pay | Admitting: Internal Medicine

## 2022-05-10 ENCOUNTER — Other Ambulatory Visit: Payer: Self-pay | Admitting: Internal Medicine

## 2022-05-10 ENCOUNTER — Other Ambulatory Visit (HOSPITAL_COMMUNITY)
Admission: RE | Admit: 2022-05-10 | Discharge: 2022-05-10 | Disposition: A | Discharge status: Home / Self Care | Payer: PRIVATE HEALTH INSURANCE | Source: Ambulatory Visit | Attending: Pulmonary Disease | Admitting: Pulmonary Disease

## 2022-05-10 VITALS — BP 132/74 | HR 85 | Temp 98.6°F | Ht 69.0 in | Wt 217.0 lb

## 2022-05-10 DIAGNOSIS — Z124 Encounter for screening for malignant neoplasm of cervix: Secondary | ICD-10-CM | POA: Diagnosis not present

## 2022-05-10 DIAGNOSIS — R921 Mammographic calcification found on diagnostic imaging of breast: Secondary | ICD-10-CM

## 2022-05-10 MED ORDER — FLUCONAZOLE 150 MG PO TABS: 150.0000 mg | ORAL_TABLET | Freq: Every day | ORAL | 0 refills | Status: DC

## 2022-05-10 NOTE — Progress Notes (Addendum)
Patient ID: [name], female    DOB: 1966-01-28  MRN: [id number]  CC: Gynecologic Exam (PAP. Neoma Laming recived flu vax. )   Subjective: [name] is a 57 y.o. female who presents for PAP Her concerns today include:  DM with neuropathy, HL, HTN, DCM/sys and diastolic CHF with 99991111 on 08/2019, lung nodules resolved, hx of LT common carotid dissection, CKD 2-3, mild anemia,  vit D def, anxiety/depression, mild intermittent asthma.   GYN History:  Pt is G2P2 Any hx of abn paps?: yes, cyrosurgery Menses regular or irregular?: postmenopausal x 2 yrs How long does menses last?  NA Menstrual flow light or heavy?: NA Method of birth control?:  NA Any vaginal dischg at this time?:  no Dysuria?: no Any hx of STI?: no Sexually active with how many partners: one female partner Desires STI screen:  yes Last MMG: 05/09/2022 Family hx of uterine, cervical or breast cancer?:  paternal GM with breast CA  Patient Active Problem List   Diagnosis Date Noted   NPDR (nonproliferative diabetic retinopathy) ([place]) 10/04/2021   Acute cough 04/03/2021   Upper respiratory tract infection 04/03/2021   Anxiety and depression 01/17/2021   Sinus tachycardia 10/15/2018   Vitamin D deficiency 03/26/2018   Homeless 03/24/2018   Microalbuminuria 08/11/2017   Anemia 08/08/2017   Physical deconditioning    Hyperlipidemia 02/21/2017   Diabetic polyneuropathy associated with type 2 diabetes mellitus ([place]) 02/21/2017   Lung nodules 07/16/2016   Uncontrolled type 2 diabetes mellitus without complication, with long-term current use of insulin 07/16/2016   DCM (dilated cardiomyopathy) ([place])    Acute on chronic combined systolic and diastolic CHF (congestive heart failure) ([place]) 06/10/2016   Essential hypertension 06/10/2016   Dyslipidemia associated with type 2 diabetes mellitus ([place]) 06/10/2016     Current Outpatient Medications on File Prior to Visit  Medication Sig Dispense Refill   Accu-Chek  Softclix Lancets lancets Use to check blood sugar up to 3 times daily. E11.42 100 each 2   albuterol (PROVENTIL) (2.5 MG/3ML) 0.083% nebulizer solution USE 3 MILLILITERS IN NEBULIZER EVERY 6 HOURS AS NEEDED FOR WHEEZING FOR SHORTNESS OF BREATH 75 mL 0   albuterol (VENTOLIN HFA) 108 (90 Base) MCG/ACT inhaler Inhale 2 puffs into the lungs every 6 (six) hours as needed for wheezing or shortness of breath. 18 g 6   aspirin 81 MG chewable tablet Chew 1 tablet (81 mg total) by mouth daily. 30 tablet 3   blood glucose meter kit and supplies Dispense based on patient and insurance preference. Use up to four times daily as directed. (FOR ICD-9 250.00, 250.01). 1 each 0   Blood Glucose Monitoring Suppl (ACCU-CHEK GUIDE ME) w/Device KIT 1 kit by Does not apply route 3 (three) times daily. Use to check blood sugar up to 3 times daily. 1 kit 0   carvedilol (COREG) 25 MG tablet Take 1 tablet (25 mg total) by mouth 2 (two) times daily with a meal. 180 tablet 3   Cholecalciferol (VITAMIN D3) 10 MCG (400 UNIT) tablet Take 1 tablet (400 Units total) by mouth daily. 100 tablet 1   Continuous Blood Gluc Transmit (DEXCOM G6 TRANSMITTER) MISC 1 packet by Does not apply route daily. 1 each 4   Dulaglutide (TRULICITY) [id number] 0000000 SOPN Inject 0.75 mg into the skin once a week. E11.69/E66.9 2 mL 6   DULoxetine (CYMBALTA) 20 MG capsule Take 1 capsule (20 mg total) by mouth daily. 30 capsule 5   furosemide (LASIX) 40 MG tablet  TAKE 1 TABLET BY MOUTH ONCE DAILY FOR  FLUID/HEART  FAILURE 90 tablet 1   gabapentin (NEURONTIN) 100 MG capsule Take 2 capsules (200 mg total) by mouth at bedtime. For diabetic neuropathy symptoms 180 capsule 2   glucose blood (ACCU-CHEK GUIDE) test strip Use as instructed to check blood sugar up to 3 times daily. 100 each 11   hydrALAZINE (APRESOLINE) 50 MG tablet Take 1 tablet (50 mg total) by mouth 3 (three) times daily. 270 tablet 3   isosorbide mononitrate (IMDUR) 30 MG 24 hr tablet Take 1 tablet  (30 mg total) by mouth daily. 90 tablet 3   LANTUS SOLOSTAR 100 UNIT/ML Solostar Pen Inject 38 Units into the skin daily. 45 mL 3   metFORMIN (GLUCOPHAGE-XR) 500 MG 24 hr tablet Take 1 tablet (500 mg total) by mouth daily with breakfast. 90 tablet 1   rosuvastatin (CRESTOR) 40 MG tablet Take 1 tablet (40 mg total) by mouth daily. 90 tablet 3   spironolactone (ALDACTONE) 25 MG tablet Take 1 tablet (25 mg total) by mouth daily. 90 tablet 3   TRUEPLUS PEN NEEDLES 31G X 6 MM MISC USE AS DIRECTED 100 each 1   ezetimibe (ZETIA) 10 MG tablet Take 1 tablet (10 mg total) by mouth daily. (Patient not taking: Reported on 05/10/2022) 90 tablet 3   No current facility-administered medications on file prior to visit.    Allergies  Allergen Reactions   Lisinopril Hives and Swelling    Facial    Sertraline Hives and Swelling    Facial    Tramadol Hives and Swelling    facial   Ibuprofen Hives and Swelling    Social History   Socioeconomic History   Marital status: Divorced    Spouse name: Not on file   Number of children: 2   Years of education: Not on file   Highest education level: Associate degree: academic program  Occupational History   Not on file  Tobacco Use   Smoking status: Never   Smokeless tobacco: Never  Vaping Use   Vaping Use: Never used  Substance and Sexual Activity   Alcohol use: No   Drug use: No   Sexual activity: Yes    Birth control/protection: None  Other Topics Concern   Not on file  Social History Narrative   Lives in [place].  Presently unemployed but attends school full time   Social Determinants of Health   Financial Resource Strain: Low Risk  (05/06/2022)   Overall Financial Resource Strain (CARDIA)    Difficulty of Paying Living Expenses: Not hard at all  Food Insecurity: No Food Insecurity (05/06/2022)   Hunger Vital Sign    Worried About Running Out of Food in the Last Year: Never true    [place] in the Last Year: Never true   Transportation Needs: No Transportation Needs (05/06/2022)   PRAPARE - Hydrologist (Medical): No    Lack of Transportation (Non-Medical): No  Physical Activity: Insufficiently Active (05/06/2022)   Exercise Vital Sign    Days of Exercise per Week: 3 days    Minutes of Exercise per Session: 30 min  Stress: No Stress Concern Present (05/06/2022)   [place]    Feeling of Stress : Not at all  Social Connections: Moderately Integrated (05/06/2022)   Social Connection and Isolation Panel [NHANES]    Frequency of Communication with Friends and Family: Twice a week  Frequency of Social Gatherings with Friends and Family: Once a week    Attends Religious Services: 1 to 4 times per year    Active Member of [employer] or Organizations: Yes    Attends Archivist Meetings: 1 to 4 times per year    Marital Status: Divorced  Human resources officer Violence: Not on file    Family History  Problem Relation Age of Onset   Diabetes Other        multiple   Heart failure Paternal Grandmother    Breast cancer Paternal Grandmother    Heart disease Other        multiple   Breast cancer Mother     Past Surgical History:  Procedure Laterality Date   CERVIX LESION DESTRUCTION     DILATION AND CURETTAGE OF UTERUS     LEFT HEART CATHETERIZATION WITH CORONARY ANGIOGRAM N/A 05/21/2011   Procedure: LEFT HEART CATHETERIZATION WITH CORONARY ANGIOGRAM;  Surgeon: Sherren Mocha, MD;  Location[facility] LAB;  Service: Cardiovascular;  Laterality: N/A;   TUBAL LIGATION  1996    ROS: Review of Systems Negative except as stated above  PHYSICAL EXAM: BP 132/74 (BP Location: Left Arm, Patient Position: Sitting, Cuff Size: Large)   Pulse 85   Temp 98.6 F (37 C) (Oral)   Ht 5\' 9"  (1.753 m)   Wt 217 lb (98.4 kg)   SpO2 99%   BMI 32.05 kg/m   Physical Exam  General appearance - alert, well appearing, middle-aged  African-American female and in no distress Mental status - normal mood, behavior, speech, dress, motor activity, and thought processes Pelvic - normal external genitalia, vulva, vagina, cervix, uterus and adnexa      Latest Ref Rng & Units 02/26/2022    9:55 AM 12/19/2021    8:43 AM 10/23/2021    3:53 PM  CMP  Glucose 70 - 99 mg/dL 287     BUN 6 - 24 mg/dL 19     Creatinine 0.57 - 1.00 mg/dL 1.16     Sodium 134 - 144 mmol/L 138     Potassium 3.5 - 5.2 mmol/L 4.6     Chloride 96 - 106 mmol/L 97     CO2 20 - 29 mmol/L 25     Calcium 8.7 - 10.2 mg/dL 9.7     Total Protein 6.0 - 8.5 g/dL  6.7  7.0   Total Bilirubin 0.0 - 1.2 mg/dL  0.9  0.6   Alkaline Phos 44 - 121 IU/L  90  99   AST 0 - 40 IU/L  15  11   ALT 0 - 32 IU/L  15  14    Lipid Panel     Component Value Date/Time   CHOL 207 (H) 12/19/2021 0843   TRIG 76 12/19/2021 0843   HDL 48 12/19/2021 0843   CHOLHDL 4.3 12/19/2021 0843   CHOLHDL 6.4 (H) 05/05/2015 1645   VLDL 37 (H) 05/05/2015 1645   LDLCALC 145 (H) 12/19/2021 0843    CBC    Component Value Date/Time   WBC 6.9 02/26/2022 0955   WBC 8.9 07/25/2017 0837   RBC 4.52 02/26/2022 0955   RBC 3.71 (L) 07/25/2017 0837   HGB 12.4 02/26/2022 0955   HCT 39.3 02/26/2022 0955   PLT 208 02/26/2022 0955   MCV 87 02/26/2022 0955   MCH 27.4 02/26/2022 0955   MCH 26.4 07/25/2017 0837   MCHC 31.6 02/26/2022 0955   MCHC 30.6 07/25/2017 0837   RDW 13.1  02/26/2022 0955   LYMPHSABS 2.5 07/31/2017 1225   MONOABS 0.6 06/02/2017 0530   EOSABS 0.2 07/31/2017 1225   BASOSABS 0.1 07/31/2017 1225    ASSESSMENT AND PLAN:  1. Pap smear for cervical cancer screening - Cytology - PAP - Cervicovaginal ancillary only - fluconazole (DIFLUCAN) 150 MG tablet; Take 1 tablet (150 mg total) by mouth daily.  Dispense: 1 tablet; Refill: 0  Addendum 05/11/22:  CMA Dorena Dew was my chaperon for pelvic exam  Patient was given the opportunity to ask questions.  Patient verbalized  understanding of the plan and was able to repeat key elements of the plan.   This documentation was completed using Radio producer.  Any transcriptional errors are unintentional.  No orders of the defined types were placed in this encounter.    Requested Prescriptions    No prescriptions requested or ordered in this encounter    No follow-ups on file.  Karle Plumber, MD, FACP

## 2022-05-11 LAB — CERVICOVAGINAL ANCILLARY ONLY
Bacterial Vaginitis (gardnerella): NEGATIVE
Candida Glabrata: NEGATIVE
Candida Vaginitis: NEGATIVE
Chlamydia: NEGATIVE
Comment: NEGATIVE
Comment: NEGATIVE
Comment: NEGATIVE
Comment: NEGATIVE
Comment: NEGATIVE
Comment: NORMAL
Neisseria Gonorrhea: NEGATIVE
Trichomonas: NEGATIVE

## 2022-05-14 DIAGNOSIS — Z419 Encounter for procedure for purposes other than remedying health state, unspecified: Secondary | ICD-10-CM | POA: Diagnosis not present

## 2022-05-14 DIAGNOSIS — D242 Benign neoplasm of left breast: Secondary | ICD-10-CM

## 2022-05-14 HISTORY — DX: Benign neoplasm of left breast: D24.2

## 2022-05-14 LAB — CYTOLOGY - PAP
Adequacy: ABSENT
Comment: NEGATIVE
Diagnosis: NEGATIVE
High risk HPV: NEGATIVE

## 2022-05-15 ENCOUNTER — Other Ambulatory Visit: Payer: Self-pay | Admitting: Internal Medicine

## 2022-05-15 DIAGNOSIS — E669 Obesity, unspecified: Secondary | ICD-10-CM

## 2022-05-15 DIAGNOSIS — I5042 Chronic combined systolic (congestive) and diastolic (congestive) heart failure: Secondary | ICD-10-CM

## 2022-05-16 NOTE — Telephone Encounter (Signed)
Unable to refill per protocol, Rx request is too soon. Last refill 02/16/22 for 90 and 1 refill.  Requested Prescriptions  Pending Prescriptions Disp Refills   spironolactone (ALDACTONE) 25 MG tablet [Pharmacy Med Name: SPIRONOLACTONE 25MG  TABLETS] 300 tablet     Sig: TAKE 1 TABLET BY MOUTH EVERY DAY     Cardiovascular: Diuretics - Aldosterone Antagonist Failed - 05/15/2022  6:26 PM      Failed - Cr in normal range and within 180 days    Creat  Date Value Ref Range Status  06/29/2016 0.92 0.50 - 1.10 mg/dL Final   Creatinine, Ser  Date Value Ref Range Status  02/26/2022 1.16 (H) 0.57 - 1.00 mg/dL Final         Passed - K in normal range and within 180 days    Potassium  Date Value Ref Range Status  02/26/2022 4.6 3.5 - 5.2 mmol/L Final         Passed - Na in normal range and within 180 days    Sodium  Date Value Ref Range Status  02/26/2022 138 134 - 144 mmol/L Final         Passed - eGFR is 30 or above and within 180 days    GFR, Est African American  Date Value Ref Range Status  05/05/2015 80 >=60 mL/min Final   GFR calc Af Amer  Date Value Ref Range Status  02/09/2020 69 >59 mL/min/1.73 Final    Comment:    **In accordance with recommendations from the NKF-ASN Task force,**   [facility] is in the process of updating its eGFR calculation to the   2021 CKD-EPI creatinine equation that estimates kidney function   without a race variable.    GFR, Est Non African American  Date Value Ref Range Status  05/05/2015 70 >=60 mL/min Final    Comment:      The estimated GFR is a calculation valid for adults (>=47 years old) that uses the CKD-EPI algorithm to adjust for age and sex. It is   not to be used for children, pregnant women, hospitalized patients,    patients on dialysis, or with rapidly changing kidney function. According to the NKDEP, eGFR >89 is normal, 60-89 shows mild impairment, 30-59 shows moderate impairment, 15-29 shows severe impairment and <15 is ESRD.       GFR calc non Af Amer  Date Value Ref Range Status  02/09/2020 60 >59 mL/min/1.73 Final   GFR  Date Value Ref Range Status  10/30/2012 83.74 >60.00 mL/min Final   eGFR  Date Value Ref Range Status  02/26/2022 56 (L) >59 mL/min/1.73 Final         Passed - Last BP in normal range    BP Readings from Last 1 Encounters:  05/10/22 132/74         Passed - Valid encounter within last 6 months    Recent Outpatient Visits           6 days ago Pap smear for cervical cancer screening   [place], MD   2 months ago Type 2 diabetes mellitus with obesity [facility])   [place] Karle Plumber B, MD   9 months ago Type 2 diabetes mellitus with peripheral neuropathy [facility])   [place] Ladell Pier, MD   11 months ago Administrative encounter   [place], Neoma Laming  B, MD   1 year ago Acute cough   [place] [facility], Kriste Basque, NP       Future Appointments             In 2 months Ladell Pier, MD [place]   In 5 months Crenshaw, Denice Bors, MD [place] [facility]             metFORMIN (GLUCOPHAGE-XR) 500 MG 24 hr tablet [Pharmacy Med Name: METFORMIN ER 500MG  24HR TABS] 300 tablet     Sig: TAKE 1 TABLET BY [place]     Endocrinology:  Diabetes - Biguanides Failed - 05/15/2022  6:26 PM      Failed - Cr in normal range and within 360 days    Creat  Date Value Ref Range Status  06/29/2016 0.92 0.50 - 1.10 mg/dL Final   Creatinine, Ser  Date Value Ref Range Status  02/26/2022 1.16 (H) 0.57 - 1.00 mg/dL Final         Failed - HBA1C is between 0 and 7.9 and within 180 days    HbA1c, POC (controlled diabetic range)  Date Value Ref Range Status  02/26/2022 8.5 (A) 0.0 - 7.0 % Final          Failed - eGFR in normal range and within 360 days    GFR, Est African American  Date Value Ref Range Status  05/05/2015 80 >=60 mL/min Final   GFR calc Af Amer  Date Value Ref Range Status  02/09/2020 69 >59 mL/min/1.73 Final    Comment:    **In accordance with recommendations from the NKF-ASN Task force,**   [facility] is in the process of updating its eGFR calculation to the   2021 CKD-EPI creatinine equation that estimates kidney function   without a race variable.    GFR, Est Non African American  Date Value Ref Range Status  05/05/2015 70 >=60 mL/min Final    Comment:      The estimated GFR is a calculation valid for adults (>=53 years old) that uses the CKD-EPI algorithm to adjust for age and sex. It is   not to be used for children, pregnant women, hospitalized patients,    patients on dialysis, or with rapidly changing kidney function. According to the NKDEP, eGFR >89 is normal, 60-89 shows mild impairment, 30-59 shows moderate impairment, 15-29 shows severe impairment and <15 is ESRD.      GFR calc non Af Amer  Date Value Ref Range Status  02/09/2020 60 >59 mL/min/1.73 Final   GFR  Date Value Ref Range Status  10/30/2012 83.74 >60.00 mL/min Final   eGFR  Date Value Ref Range Status  02/26/2022 56 (L) >59 mL/min/1.73 Final         Failed - CBC within normal limits and completed in the last 12 months    WBC  Date Value Ref Range Status  02/26/2022 6.9 3.4 - 10.8 x10E3/uL Final  07/25/2017 8.9 4.0 - 10.5 K/uL Final   RBC  Date Value Ref Range Status  02/26/2022 4.52 3.77 - 5.28 x10E6/uL Final  07/25/2017 3.71 (L) 3.87 - 5.11 MIL/uL Final   Hemoglobin  Date Value Ref Range Status  02/26/2022 12.4 11.1 - 15.9 g/dL Final   Total hemoglobin  Date Value Ref Range Status  02/23/2015 13.5 12.0 - 16.0 g/dL Final   Hematocrit  Date Value Ref Range Status  02/26/2022  39.3 34.0 - 46.6 % Final   MCHC  Date Value Ref Range Status  02/26/2022 31.6 31.5 - 35.7  g/dL Final  07/25/2017 30.6 30.0 - 36.0 g/dL Final   [facility]  Date Value Ref Range Status  02/26/2022 27.4 26.6 - 33.0 pg Final  07/25/2017 26.4 26.0 - 34.0 pg Final   MCV  Date Value Ref Range Status  02/26/2022 87 79 - 97 fL Final   No results found for: "PLTCOUNTKUC", "LABPLAT", "POCPLA" RDW  Date Value Ref Range Status  02/26/2022 13.1 11.7 - 15.4 % Final         Passed - B12 Level in normal range and within 720 days    Vitamin B-12  Date Value Ref Range Status  03/21/2021 573 232 - 1,245 pg/mL Final         Passed - Valid encounter within last 6 months    Recent Outpatient Visits           6 days ago Pap smear for cervical cancer screening   [place], MD   2 months ago Type 2 diabetes mellitus with obesity [facility])   [place] Karle Plumber B, MD   9 months ago Type 2 diabetes mellitus with peripheral neuropathy [facility])   [place] Ladell Pier, MD   11 months ago Administrative encounter   [place] Ladell Pier, MD   1 year ago Acute cough   [place] [facility], Kriste Basque, NP       Future Appointments             In 2 months [place] Emery, Dalbert Batman, MD [place]   In 5 months Crenshaw, Denice Bors, MD [place] [facility]

## 2022-05-19 ENCOUNTER — Other Ambulatory Visit: Payer: Self-pay | Admitting: Internal Medicine

## 2022-05-19 DIAGNOSIS — E1142 Type 2 diabetes mellitus with diabetic polyneuropathy: Secondary | ICD-10-CM

## 2022-05-21 NOTE — Telephone Encounter (Signed)
Requested Prescriptions  Pending Prescriptions Disp Refills   LANTUS SOLOSTAR 100 UNIT/ML Solostar Pen [Pharmacy Med Name: LANTUS SOLOSTAR PEN INJ 3ML] 45 mL 0    Sig: INJECT 38 UNITS INTO THE SKIN DAILY     Endocrinology:  Diabetes - Insulins Failed - 05/19/2022  3:44 AM      Failed - HBA1C is between 0 and 7.9 and within 180 days    HbA1c, POC (controlled diabetic range)  Date Value Ref Range Status  02/26/2022 8.5 (A) 0.0 - 7.0 % Final         Passed - Valid encounter within last 6 months    Recent Outpatient Visits           1 week ago Pap smear for cervical cancer screening   [place] [facility] Jonah Blue B, MD   2 months ago Type 2 diabetes mellitus with obesity [facility])   [place] [facility] Jonah Blue B, MD   10 months ago Type 2 diabetes mellitus with peripheral neuropathy [facility])   [place] [facility] Marcine Matar, MD   12 months ago Administrative encounter   [place] [facility] Marcine Matar, MD   1 year ago Acute cough   [place] [facility], Gildardo Pounds, NP       Future Appointments             In 1 month [place], Binnie Rail, MD [facility]   In 5 months Crenshaw, Madolyn Frieze, MD [facility]

## 2022-05-24 ENCOUNTER — Ambulatory Visit: Payer: Self-pay | Admitting: *Deleted

## 2022-05-24 ENCOUNTER — Ambulatory Visit
Admission: RE | Admit: 2022-05-24 | Discharge: 2022-05-24 | Disposition: A | Discharge status: Home / Self Care | Payer: PRIVATE HEALTH INSURANCE | Source: Ambulatory Visit | Attending: Internal Medicine | Admitting: Internal Medicine

## 2022-05-24 DIAGNOSIS — R921 Mammographic calcification found on diagnostic imaging of breast: Secondary | ICD-10-CM

## 2022-05-24 HISTORY — PX: BREAST BIOPSY: SHX20

## 2022-05-24 NOTE — Telephone Encounter (Signed)
  Chief Complaint: left ear discomfort- patient feels like something is in her ear Symptoms: muffled sound, some ringing at times, "uncomfortable"- feels like something is in her ear Frequency: started last night Pertinent Negatives: Patient denies pain,dizziness Disposition: [] ED /[] Urgent Care (no appt availability in office) / [] Appointment(In office/virtual)/ []  [facility]/ [] Home Care/ [] Refused Recommended Disposition /[] [place] [facility]/ []  Follow-up with PCP Additional Notes: Patient advised no open appointment- will need to check with scheduling to see if she can be scheduled at office. Patient does not want to go to UC due to cost. [facility] at 2:30- so appointment would need to be late or better tomorrow. Patient advised office is closed due to lunch and I will send her request- patient gives permission to leave a VM if she can not answer.

## 2022-05-24 NOTE — Telephone Encounter (Signed)
Patient has c/o left ear discomfort , voice feels as tho the something in it. Appointment for 04/15/ given

## 2022-05-24 NOTE — Telephone Encounter (Signed)
Summary: Left ear discomfort   Pt says left ear is bothering and that she wants to speak to a nurse  Best contact: [phone]      Reason for Disposition  Recurrent episodes of hearing loss, dizziness, and ringing in the ear  Answer Assessment - Initial Assessment Questions 1. DESCRIPTION: "What type of hearing problem are you having? Describe it for me." (e.g., complete hearing loss, partial loss)     Muffled sound- uncomfortable 2. LOCATION: "One or both ears?" If one, ask: "Which ear?"     Left ear 3. SEVERITY: "Can you hear anything?" If Yes, ask: "What can you hear?" (e.g., ticking watch, whisper, talking)   - MILD:  Difficulty hearing soft speech, quiet library sounds, or speech from a distance or over background noise.   - MODERATE: Difficulty hearing normal speech even at closed distances.   - SEVERE: Unable to hear most normal conversation and talking; only able to hear loud sounds such as an alarm clock.     mild 4. ONSET: "When did this begin?" "Did it start suddenly or come on gradually?"     Started last night 5. PATTERN: "Does this come and go, or has it been constant since it started?"     constant 6. PAIN: "Is there any pain in your ear(s)?"  (Scale 1-10; or mild, moderate, severe)   - NONE (0): no pain   - MILD (1-3): doesn't interfere with normal activities    - MODERATE (4-7): interferes with normal activities or awakens from sleep    - SEVERE (8-10): excruciating pain, unable to do any normal activities      No pain 7. CAUSE: "What do you think is causing this hearing problem?"     unsure 8. OTHER SYMPTOMS: "Do you have any other symptoms?" (e.g., dizziness, ringing in ears)     Some ringing, muffled sound, feels like something is in the ear  Protocols used: Hearing Loss or Change-A-AH

## 2022-05-25 NOTE — Progress Notes (Signed)
Patient ID: [name], female    DOB: 1966/01/20  MRN: [id number]  CC: Left Ear Discomfort   Subjective: [name] is a 57 y.o. female who presents for left ear discomfort.   Her concerns today include:  05/24/2022 per triage RN call note: Chief Complaint: left ear discomfort- patient feels like something is in her ear Symptoms: muffled sound, some ringing at times, "uncomfortable"- feels like something is in her ear Frequency: started last night Pertinent Negatives: Patient denies pain,dizziness Disposition: [] ED /[] Urgent Care (no appt availability in office) / [] Appointment(In office/virtual)/ []  [facility]/ [] Home Care/ [] Refused Recommended Disposition /[] [facility]/ []  Follow-up with PCP Additional Notes: Patient advised no open appointment- will need to check with scheduling to see if she can be scheduled at office. Patient does not want to go to UC due to cost. [facility] at 2:30- so appointment would need to be late or better tomorrow. Patient advised office is closed due to lunch and I will send her request- patient gives permission to leave a VM if she can not answer.  Summary: Left ear discomfort    Pt says left ear is bothering and that she wants to speak to a nurse  Best contact: [phone]      Reason for Disposition  Recurrent episodes of hearing loss, dizziness, and ringing in the ear  Answer Assessment - Initial Assessment Questions 1. DESCRIPTION: "What type of hearing problem are you having? Describe it for me." (e.g., complete hearing loss, partial loss)     Muffled sound- uncomfortable 2. LOCATION: "One or both ears?" If one, ask: "Which ear?"     Left ear 3. SEVERITY: "Can you hear anything?" If Yes, ask: "What can you hear?" (e.g., ticking watch, whisper, talking)   - MILD:  Difficulty hearing soft speech, quiet library sounds, or speech from a distance or over background  noise.   - MODERATE: Difficulty hearing normal speech even at closed distances.   - SEVERE: Unable to hear most normal conversation and talking; only able to hear loud sounds such as an alarm clock.     mild 4. ONSET: "When did this begin?" "Did it start suddenly or come on gradually?"     Started last night 5. PATTERN: "Does this come and go, or has it been constant since it started?"     constant 6. PAIN: "Is there any pain in your ear(s)?"  (Scale 1-10; or mild, moderate, severe)   - NONE (0): no pain   - MILD (1-3): doesn't interfere with normal activities    - MODERATE (4-7): interferes with normal activities or awakens from sleep    - SEVERE (8-10): excruciating pain, unable to do any normal activities      No pain 7. CAUSE: "What do you think is causing this hearing problem?"     unsure 8. OTHER SYMPTOMS: "Do you have any other symptoms?" (e.g., dizziness, ringing in ears)     Some ringing, muffled sound, feels like something is in the ear  Protocols used: Hearing Loss or Change-A-AH  Today's visit 05/28/2022: Today patient reports left ear pain and associated symptoms have subsided. She denies associated red flag symptoms. Reports she has normal hearing of the left ear. She does have occasional itching of ears with no additional symptoms. Reports she sometimes she uses "sweet oil" to help. No further issues/concerns for discussion today.  Patient Active Problem List   Diagnosis Date  Noted   NPDR (nonproliferative diabetic retinopathy) 10/04/2021   Acute cough 04/03/2021   Upper respiratory tract infection 04/03/2021   Anxiety and depression 01/17/2021   Sinus tachycardia 10/15/2018   Vitamin D deficiency 03/26/2018   Homeless 03/24/2018   Microalbuminuria 08/11/2017   Anemia 08/08/2017   Physical deconditioning    Hyperlipidemia 02/21/2017   Diabetic polyneuropathy associated with type 2 diabetes mellitus 02/21/2017   Lung nodules 07/16/2016   Uncontrolled type 2  diabetes mellitus without complication, with long-term current use of insulin 07/16/2016   DCM (dilated cardiomyopathy)    Acute on chronic combined systolic and diastolic CHF (congestive heart failure) 06/10/2016   Essential hypertension 06/10/2016   Dyslipidemia associated with type 2 diabetes mellitus 06/10/2016     Current Outpatient Medications on File Prior to Visit  Medication Sig Dispense Refill   carvedilol (COREG) 12.5 MG tablet Take by mouth.     furosemide (LASIX) 40 MG tablet Take by mouth.     metFORMIN (GLUCOPHAGE-XR) 500 MG 24 hr tablet Take 500 mg by mouth.     Accu-Chek Softclix Lancets lancets Use to check blood sugar up to 3 times daily. E11.42 100 each 2   albuterol (PROVENTIL) (2.5 MG/3ML) 0.083% nebulizer solution USE 3 MILLILITERS IN NEBULIZER EVERY 6 HOURS AS NEEDED FOR WHEEZING FOR SHORTNESS OF BREATH 75 mL 0   albuterol (VENTOLIN HFA) 108 (90 Base) MCG/ACT inhaler Inhale 2 puffs into the lungs every 6 (six) hours as needed for wheezing or shortness of breath. 18 g 6   aspirin 81 MG chewable tablet Chew 1 tablet (81 mg total) by mouth daily. 30 tablet 3   blood glucose meter kit and supplies Dispense based on patient and insurance preference. Use up to four times daily as directed. (FOR ICD-9 250.00, 250.01). 1 each 0   Blood Glucose Monitoring Suppl (ACCU-CHEK GUIDE ME) w/Device KIT 1 kit by Does not apply route 3 (three) times daily. Use to check blood sugar up to 3 times daily. 1 kit 0   Cholecalciferol (VITAMIN D3) 10 MCG (400 UNIT) tablet Take 1 tablet (400 Units total) by mouth daily. 100 tablet 1   Continuous Blood Gluc Transmit (DEXCOM G6 TRANSMITTER) MISC 1 packet by Does not apply route daily. 1 each 4   Dulaglutide (TRULICITY) 0.75 MG/0.5ML SOPN Inject 0.75 mg into the skin once a week. E11.69/E66.9 2 mL 6   DULoxetine (CYMBALTA) 20 MG capsule Take 1 capsule (20 mg total) by mouth daily. 30 capsule 5   ezetimibe (ZETIA) 10 MG tablet Take 1 tablet (10 mg  total) by mouth daily. (Patient not taking: Reported on 05/10/2022) 90 tablet 3   fluconazole (DIFLUCAN) 150 MG tablet Take 1 tablet (150 mg total) by mouth daily. 1 tablet 0   gabapentin (NEURONTIN) 100 MG capsule Take 2 capsules (200 mg total) by mouth at bedtime. For diabetic neuropathy symptoms 180 capsule 2   glucose blood (ACCU-CHEK GUIDE) test strip Use as instructed to check blood sugar up to 3 times daily. 100 each 11   hydrALAZINE (APRESOLINE) 50 MG tablet Take 1 tablet (50 mg total) by mouth 3 (three) times daily. 270 tablet 3   isosorbide mononitrate (IMDUR) 30 MG 24 hr tablet Take 1 tablet (30 mg total) by mouth daily. 90 tablet 3   LANTUS SOLOSTAR 100 UNIT/ML Solostar Pen INJECT 38 UNITS INTO THE SKIN DAILY 45 mL 0   rosuvastatin (CRESTOR) 40 MG tablet Take 1 tablet (40 mg total) by mouth daily. 90 tablet  3   spironolactone (ALDACTONE) 25 MG tablet Take 1 tablet (25 mg total) by mouth daily. 90 tablet 3   TRUEPLUS PEN NEEDLES 31G X 6 MM MISC USE AS DIRECTED 100 each 1   No current facility-administered medications on file prior to visit.    Allergies  Allergen Reactions   Lisinopril Hives and Swelling    Facial    Sertraline Hives and Swelling    Facial    Tramadol Hives and Swelling    facial   Ibuprofen Hives and Swelling    Social History   Socioeconomic History   Marital status: Divorced    Spouse name: Not on file   Number of children: 2   Years of education: Not on file   Highest education level: Associate degree: academic program  Occupational History   Not on file  Tobacco Use   Smoking status: Never   Smokeless tobacco: Never  Vaping Use   Vaping Use: Never used  Substance and Sexual Activity   Alcohol use: No   Drug use: No   Sexual activity: Yes    Birth control/protection: None  Other Topics Concern   Not on file  Social History Narrative   Lives in [place].  Presently unemployed but attends school full time   Social Determinants of Health    Financial Resource Strain: Low Risk  (05/06/2022)   Overall Financial Resource Strain (CARDIA)    Difficulty of Paying Living Expenses: Not hard at all  Food Insecurity: No Food Insecurity (05/06/2022)   Hunger Vital Sign    Worried About Running Out of Food in the Last Year: Never true    Ran Out of Food in the Last Year: Never true  Transportation Needs: No Transportation Needs (05/06/2022)   PRAPARE - Administrator, Civil Service (Medical): No    Lack of Transportation (Non-Medical): No  Physical Activity: Insufficiently Active (05/06/2022)   Exercise Vital Sign    Days of Exercise per Week: 3 days    Minutes of Exercise per Session: 30 min  Stress: No Stress Concern Present (05/06/2022)   [employer] of Occupational Health - Occupational Stress Questionnaire    Feeling of Stress : Not at all  Social Connections: Moderately Integrated (05/06/2022)   Social Connection and Isolation Panel [NHANES]    Frequency of Communication with Friends and Family: Twice a week    Frequency of Social Gatherings with Friends and Family: Once a week    Attends Religious Services: 1 to 4 times per year    Active Member of [employer] or Organizations: Yes    Attends Engineer, structural: 1 to 4 times per year    Marital Status: Divorced  Catering manager Violence: Not on file    Family History  Problem Relation Age of Onset   Diabetes Other        multiple   Heart failure Paternal Grandmother    Breast cancer Paternal Grandmother    Heart disease Other        multiple   Breast cancer Mother     Past Surgical History:  Procedure Laterality Date   BREAST BIOPSY Left 05/24/2022   MM LT BREAST BX W LOC DEV 1ST LESION IMAGE BX SPEC STEREO GUIDE 05/24/2022 [facility]   CERVIX LESION DESTRUCTION     DILATION AND CURETTAGE OF UTERUS     LEFT HEART CATHETERIZATION WITH CORONARY ANGIOGRAM N/A 05/21/2011   Procedure: LEFT HEART CATHETERIZATION WITH CORONARY ANGIOGRAM;   Surgeon:  Tonny Bollman, MD;  Location: [facility] CATH LAB;  Service: Cardiovascular;  Laterality: N/A;   TUBAL LIGATION  1996    ROS: Review of Systems Negative except as stated above  PHYSICAL EXAM: BP 128/78 (BP Location: Right Arm, Patient Position: Sitting, Cuff Size: Normal)   Pulse 79   Temp 97.9 F (36.6 C)   Resp 14   Ht 5\' 9"  (1.753 m)   Wt 211 lb (95.7 kg)   SpO2 98%   BMI 31.16 kg/m   Physical Exam HENT:     Head: Normocephalic and atraumatic.     Right Ear: Tympanic membrane, ear canal and external ear normal.     Left Ear: Tympanic membrane, ear canal and external ear normal.     Nose: Nose normal.     Mouth/Throat:     Mouth: Mucous membranes are moist.     Pharynx: Oropharynx is clear.  Eyes:     Extraocular Movements: Extraocular movements intact.     Conjunctiva/sclera: Conjunctivae normal.     Pupils: Pupils are equal, round, and reactive to light.  Cardiovascular:     Rate and Rhythm: Normal rate and regular rhythm.     Pulses: Normal pulses.     Heart sounds: Normal heart sounds.  Pulmonary:     Effort: Pulmonary effort is normal.     Breath sounds: Normal breath sounds.  Musculoskeletal:     Cervical back: Normal range of motion and neck supple.  Neurological:     General: No focal deficit present.     Mental Status: She is alert and oriented to person, place, and time.  Psychiatric:        Mood and Affect: Mood normal.        Behavior: Behavior normal.    ASSESSMENT AND PLAN: 1. Left ear pain - Resolved prior to today's appointment.  - Follow-up with Jonah Blue, MD as scheduled.    Patient was given the opportunity to ask questions.  Patient verbalized understanding of the plan and was able to repeat key elements of the plan. Patient was given clear instructions to go to Emergency Department or return to medical center if symptoms don't improve, worsen, or new problems develop.The patient verbalized understanding.  Return in about 4 weeks  (around 06/25/2022) for Follow-Up or next available Jonah Blue, MD .  Rema Fendt, NP

## 2022-05-28 ENCOUNTER — Encounter: Payer: Self-pay | Admitting: Family

## 2022-05-28 ENCOUNTER — Ambulatory Visit (INDEPENDENT_AMBULATORY_CARE_PROVIDER_SITE_OTHER): Payer: PRIVATE HEALTH INSURANCE | Admitting: Family

## 2022-05-28 VITALS — BP 128/78 | HR 79 | Temp 97.9°F | Resp 14 | Ht 69.0 in | Wt 211.0 lb

## 2022-05-28 DIAGNOSIS — H9202 Otalgia, left ear: Secondary | ICD-10-CM | POA: Diagnosis not present

## 2022-05-28 NOTE — Progress Notes (Signed)
Pt here for ear pain   Complaining of left ear pain, pressure, feels like water in her ear per patient, Pain started X5 days ago

## 2022-07-16 ENCOUNTER — Ambulatory Visit: Payer: PRIVATE HEALTH INSURANCE | Admitting: Internal Medicine

## 2022-07-23 ENCOUNTER — Ambulatory Visit (HOSPITAL_COMMUNITY): Payer: PRIVATE HEALTH INSURANCE | Attending: Internal Medicine

## 2022-07-23 DIAGNOSIS — I340 Nonrheumatic mitral (valve) insufficiency: Secondary | ICD-10-CM | POA: Insufficient documentation

## 2022-07-23 LAB — ECHOCARDIOGRAM COMPLETE
Area-P 1/2: 3.72 cm2
S' Lateral: 4.3 cm

## 2022-07-25 ENCOUNTER — Encounter: Payer: Self-pay | Admitting: *Deleted

## 2022-09-19 ENCOUNTER — Other Ambulatory Visit: Payer: Self-pay | Admitting: Internal Medicine

## 2022-09-19 DIAGNOSIS — E1142 Type 2 diabetes mellitus with diabetic polyneuropathy: Secondary | ICD-10-CM

## 2022-10-04 ENCOUNTER — Encounter: Payer: Self-pay | Admitting: Internal Medicine

## 2022-10-04 ENCOUNTER — Ambulatory Visit: Payer: PRIVATE HEALTH INSURANCE | Attending: Internal Medicine | Admitting: Internal Medicine

## 2022-10-04 VITALS — BP 145/82 | HR 85 | Ht 69.0 in | Wt 213.0 lb

## 2022-10-04 DIAGNOSIS — Z1211 Encounter for screening for malignant neoplasm of colon: Secondary | ICD-10-CM

## 2022-10-04 DIAGNOSIS — I5042 Chronic combined systolic (congestive) and diastolic (congestive) heart failure: Secondary | ICD-10-CM

## 2022-10-04 DIAGNOSIS — I152 Hypertension secondary to endocrine disorders: Secondary | ICD-10-CM

## 2022-10-04 DIAGNOSIS — E1169 Type 2 diabetes mellitus with other specified complication: Secondary | ICD-10-CM

## 2022-10-04 DIAGNOSIS — E1159 Type 2 diabetes mellitus with other circulatory complications: Secondary | ICD-10-CM

## 2022-10-04 DIAGNOSIS — J4520 Mild intermittent asthma, uncomplicated: Secondary | ICD-10-CM

## 2022-10-04 DIAGNOSIS — E669 Obesity, unspecified: Secondary | ICD-10-CM

## 2022-10-04 DIAGNOSIS — Z7985 Long-term (current) use of injectable non-insulin antidiabetic drugs: Secondary | ICD-10-CM

## 2022-10-04 DIAGNOSIS — E785 Hyperlipidemia, unspecified: Secondary | ICD-10-CM

## 2022-10-04 LAB — POCT GLYCOSYLATED HEMOGLOBIN (HGB A1C): HbA1c, POC (controlled diabetic range): 9.6 % — AB (ref 0.0–7.0)

## 2022-10-04 LAB — GLUCOSE, POCT (MANUAL RESULT ENTRY): POC Glucose: 197 mg/dl — AB (ref 70–99)

## 2022-10-04 MED ORDER — METFORMIN HCL ER 500 MG PO TB24
500.0000 mg | ORAL_TABLET | Freq: Every day | ORAL | 1 refills | Status: DC
Start: 2022-10-04 — End: 2022-12-12

## 2022-10-04 MED ORDER — TRULICITY 1.5 MG/0.5ML ~~LOC~~ SOAJ
1.5000 mg | SUBCUTANEOUS | 6 refills | Status: DC
Start: 2022-10-04 — End: 2023-05-22

## 2022-10-04 MED ORDER — ALBUTEROL SULFATE HFA 108 (90 BASE) MCG/ACT IN AERS
2.0000 | INHALATION_SPRAY | Freq: Four times a day (QID) | RESPIRATORY_TRACT | 6 refills | Status: AC | PRN
Start: 2022-10-04 — End: ?

## 2022-10-04 NOTE — Patient Instructions (Addendum)
I recommend self from med box and therapy medications weekly to help remind yourself to take them daily. Increase Trulicity to 1.5 mg once a week as discussed today.  Stop the medicine if you develop any vomiting, abdominal pain, severe diarrhea or not able to pass your bowel.  Healthy Eating, Adult Healthy eating may help you get and keep a healthy body weight, reduce the risk of chronic disease, and live a long and productive life. It is important to follow a healthy eating pattern. Your nutritional and calorie needs should be met mainly by different nutrient-rich foods. What are tips for following this plan? Reading food labels Read labels and choose the following: Reduced or low sodium products. Juices with 100% fruit juice. Foods with low saturated fats (<3 g per serving) and high polyunsaturated and monounsaturated fats. Foods with whole grains, such as whole wheat, cracked wheat, brown rice, and wild rice. Whole grains that are fortified with folic acid. This is recommended for females who are pregnant or who want to become pregnant. Read labels and do not eat or drink the following: Foods or drinks with added sugars. These include foods that contain brown sugar, corn sweetener, corn syrup, dextrose, fructose, glucose, high-fructose corn syrup, honey, invert sugar, lactose, malt syrup, maltose, molasses, raw sugar, sucrose, trehalose, or turbinado sugar. Limit your intake of added sugars to less than 10% of your total daily calories. Do not eat more than the following amounts of added sugar per day: 6 teaspoons (25 g) for females. 9 teaspoons (38 g) for males. Foods that contain processed or refined starches and grains. Refined grain products, such as white flour, degermed cornmeal, white bread, and white rice. Shopping Choose nutrient-rich snacks, such as vegetables, whole fruits, and nuts. Avoid high-calorie and high-sugar snacks, such as potato chips, fruit snacks, and candy. Use  oil-based dressings and spreads on foods instead of solid fats such as butter, margarine, sour cream, or cream cheese. Limit pre-made sauces, mixes, and "instant" products such as flavored rice, instant noodles, and ready-made pasta. Try more plant-protein sources, such as tofu, tempeh, black beans, edamame, lentils, nuts, and seeds. Explore eating plans such as the Mediterranean diet or vegetarian diet. Try heart-healthy dips made with beans and healthy fats like hummus and guacamole. Vegetables go great with these. Cooking Use oil to saut or stir-fry foods instead of solid fats such as butter, margarine, or lard. Try baking, boiling, grilling, or broiling instead of frying. Remove the fatty part of meats before cooking. Steam vegetables in water or broth. Meal planning  At meals, imagine dividing your plate into fourths: One-half of your plate is fruits and vegetables. One-fourth of your plate is whole grains. One-fourth of your plate is protein, especially lean meats, poultry, eggs, tofu, beans, or nuts. Include low-fat dairy as part of your daily diet. Lifestyle Choose healthy options in all settings, including home, work, school, restaurants, or stores. Prepare your food safely: Wash your hands after handling raw meats. Where you prepare food, keep surfaces clean by regularly washing with hot, soapy water. Keep raw meats separate from ready-to-eat foods, such as fruits and vegetables. Cook seafood, meat, poultry, and eggs to the recommended temperature. Get a food thermometer. Store foods at safe temperatures. In general: Keep cold foods at 85F (4.4C) or below. Keep hot foods at 185F (60C) or above. Keep your freezer at [facility] (-17.8C) or below. Foods are not safe to eat if they have been between the temperatures of 40-185F (4.4-60C) for more than  2 hours. What foods should I eat? Fruits Aim to eat 1-2 cups of fresh, canned (in natural juice), or frozen fruits each day. One  cup of fruit equals 1 small apple, 1 large banana, 8 large strawberries, 1 cup (237 g) canned fruit,  cup (82 g) dried fruit, or 1 cup (240 mL) 100% juice. Vegetables Aim to eat 2-4 cups of fresh and frozen vegetables each day, including different varieties and colors. One cup of vegetables equals 1 cup (91 g) broccoli or cauliflower florets, 2 medium carrots, 2 cups (150 g) raw, leafy greens, 1 large tomato, 1 large bell pepper, 1 large sweet potato, or 1 medium white potato. Grains Aim to eat 5-10 ounce-equivalents of whole grains each day. Examples of 1 ounce-equivalent of grains include 1 slice of bread, 1 cup (40 g) ready-to-eat cereal, 3 cups (24 g) popcorn, or  cup (93 g) cooked rice. Meats and other proteins Try to eat 5-7 ounce-equivalents of protein each day. Examples of 1 ounce-equivalent of protein include 1 egg,  oz nuts (12 almonds, 24 pistachios, or 7 walnut halves), 1/4 cup (90 g) cooked beans, 6 tablespoons (90 g) hummus or 1 tablespoon (16 g) peanut butter. A cut of meat or fish that is the size of a deck of cards is about 3-4 ounce-equivalents (85 g). Of the protein you eat each week, try to have at least 8 sounce (227 g) of seafood. This is about 2 servings per week. This includes salmon, trout, herring, sardines, and anchovies. Dairy Aim to eat 3 cup-equivalents of fat-free or low-fat dairy each day. Examples of 1 cup-equivalent of dairy include 1 cup (240 mL) milk, 8 ounces (250 g) yogurt, 1 ounces (44 g) natural cheese, or 1 cup (240 mL) fortified soy milk. Fats and oils Aim for about 5 teaspoons (21 g) of fats and oils per day. Choose monounsaturated fats, such as canola and olive oils, mayonnaise made with olive oil or avocado oil, avocados, peanut butter, and most nuts, or polyunsaturated fats, such as sunflower, corn, and soybean oils, walnuts, pine nuts, sesame seeds, sunflower seeds, and flaxseed. Beverages Aim for 6 eight-ounce glasses of water per day. Limit coffee to  3-5 eight-ounce cups per day. Limit caffeinated beverages that have added calories, such as soda and energy drinks. If you drink alcohol: Limit how much you have to: 0-1 drink a day if you are female. 0-2 drinks a day if you are female. Know how much alcohol is in your drink. In the [place] one drink is one 12 oz bottle of beer (355 mL), one 5 oz glass of wine (148 mL), or one 1 oz glass of hard liquor (44 mL). Seasoning and other foods Try not to add too much salt to your food. Try using herbs and spices instead of salt. Try not to add sugar to food. This information is based on [place] nutrition guidelines. To learn more, visit [employer]. Exact amounts may vary. You may need different amounts. This information is not intended to replace advice given to you by your health care provider. Make sure you discuss any questions you have with your health care provider. Document Revised: 10/30/2021 Document Reviewed: 10/30/2021 Elsevier Patient Education  2024 [employer].

## 2022-10-04 NOTE — Progress Notes (Signed)
Patient ID: [name], female    DOB: 1965/10/15  MRN: [id number]  CC: Diabetes (DM f/u. Med refills./Discuss aspirin and Vit D 3. )   Subjective: [name] is a 57 y.o. female who presents for chronic ds management Her concerns today include:  DM with neuropathy, HL, HTN, DCM/sys and diastolic CHF with EF35-40% on 09/1189, lung nodules resolved, hx of LT common carotid dissection, CKD 2-3, mild anemia,  vit D def, anxiety/depression, mild intermittent asthma.   DM: Results for orders placed or performed in visit on 10/04/22  POCT glycosylated hemoglobin (Hb A1C)  Result Value Ref Range   Hemoglobin A1C     HbA1c POC (<> result, manual entry)     HbA1c, POC (prediabetic range)     HbA1c, POC (controlled diabetic range) 9.6 (A) 0.0 - 7.0 %  POCT glucose (manual entry)  Result Value Ref Range   POC Glucose 197 (A) 70 - 99 mg/dl  On Trulicity 4.78 mg/wk, Lantus insulin 40 units/day and Metformin XR 500 mg daily. -tries to be compliant but some days she forgets to take all of her meds.  Took last dose of Trulicity earlier this wk.  Tolerating Trulicity without significant S.E -feels she needs to do better with eating habits.  Would like to see a nutritionist. Checks BS but not consistently.  Has Dexcom and not using it, prefers to do finger sticks -poor vision.  Due for eye exam.  HTN/HL/CHF:  should be on Crestor 40 mg, Zetia 10 mg, carvedilol 25 mg twice a day, spironolactone 25 mg daily, Lasix 40 mg daily, isosorbide 30 mg daily, hydralazine 50 mg 3 times a day.  Forgot to take meds this a.m Checks BP 2x/wk.  Reports good readings NO CP/LE edema/HA/dizziness Some SOB.  Request RF on Albuterol inhaler  CKD 3a: Last GFR 59  Patient Active Problem List   Diagnosis Date Noted   NPDR (nonproliferative diabetic retinopathy) (HCC) 10/04/2021   Acute cough 04/03/2021   Upper respiratory tract infection 04/03/2021   Anxiety and depression 01/17/2021   Sinus tachycardia  10/15/2018   Vitamin D deficiency 03/26/2018   Homeless 03/24/2018   Microalbuminuria 08/11/2017   Anemia 08/08/2017   Physical deconditioning    Hyperlipidemia 02/21/2017   Diabetic polyneuropathy associated with type 2 diabetes mellitus (HCC) 02/21/2017   Lung nodules 07/16/2016   Uncontrolled type 2 diabetes mellitus without complication, with long-term current use of insulin 07/16/2016   DCM (dilated cardiomyopathy) (HCC)    Essential hypertension 06/10/2016   Dyslipidemia associated with type 2 diabetes mellitus (HCC) 06/10/2016     Current Outpatient Medications on File Prior to Visit  Medication Sig Dispense Refill   Accu-Chek Softclix Lancets lancets Use to check blood sugar up to 3 times daily. E11.42 100 each 2   aspirin 81 MG chewable tablet Chew 1 tablet (81 mg total) by mouth daily. 30 tablet 3   blood glucose meter kit and supplies Dispense based on patient and insurance preference. Use up to four times daily as directed. (FOR ICD-9 250.00, 250.01). 1 each 0   Blood Glucose Monitoring Suppl (ACCU-CHEK GUIDE ME) w/Device KIT 1 kit by Does not apply route 3 (three) times daily. Use to check blood sugar up to 3 times daily. 1 kit 0   carvedilol (COREG) 12.5 MG tablet Take by mouth.     DULoxetine (CYMBALTA) 20 MG capsule Take 1 capsule (20 mg total) by mouth daily. 30 capsule 5   ezetimibe (ZETIA) 10 MG  tablet Take 1 tablet (10 mg total) by mouth daily. 90 tablet 3   furosemide (LASIX) 40 MG tablet Take by mouth.     glucose blood (ACCU-CHEK GUIDE) test strip Use as instructed to check blood sugar up to 3 times daily. 100 each 11   hydrALAZINE (APRESOLINE) 50 MG tablet Take 1 tablet (50 mg total) by mouth 3 (three) times daily. 270 tablet 3   LANTUS SOLOSTAR 100 UNIT/ML Solostar Pen INJECT 38 UNITS INTO THE SKIN DAILY 12 mL 0   rosuvastatin (CRESTOR) 40 MG tablet Take 1 tablet (40 mg total) by mouth daily. 90 tablet 3   spironolactone (ALDACTONE) 25 MG tablet Take 1 tablet (25  mg total) by mouth daily. 90 tablet 3   TRUEPLUS PEN NEEDLES 31G X 6 MM MISC USE AS DIRECTED 100 each 1   albuterol (PROVENTIL) (2.5 MG/3ML) 0.083% nebulizer solution USE 3 MILLILITERS IN NEBULIZER EVERY 6 HOURS AS NEEDED FOR WHEEZING FOR SHORTNESS OF BREATH (Patient not taking: Reported on 10/04/2022) 75 mL 0   Cholecalciferol (VITAMIN D3) 10 MCG (400 UNIT) tablet Take 1 tablet (400 Units total) by mouth daily. (Patient not taking: Reported on 10/04/2022) 100 tablet 1   Continuous Blood Gluc Transmit (DEXCOM G6 TRANSMITTER) MISC 1 packet by Does not apply route daily. (Patient not taking: Reported on 10/04/2022) 1 each 4   gabapentin (NEURONTIN) 100 MG capsule Take 2 capsules (200 mg total) by mouth at bedtime. For diabetic neuropathy symptoms (Patient not taking: Reported on 10/04/2022) 180 capsule 2   isosorbide mononitrate (IMDUR) 30 MG 24 hr tablet Take 1 tablet (30 mg total) by mouth daily. (Patient not taking: Reported on 10/04/2022) 90 tablet 3   No current facility-administered medications on file prior to visit.    Allergies  Allergen Reactions   Lisinopril Hives and Swelling    Facial    Sertraline Hives and Swelling    Facial    Tramadol Hives and Swelling    facial   Ibuprofen Hives and Swelling    Social History   Socioeconomic History   Marital status: Single    Spouse name: Not on file   Number of children: 2   Years of education: Not on file   Highest education level: Associate degree: academic program  Occupational History   Not on file  Tobacco Use   Smoking status: Never   Smokeless tobacco: Never  Vaping Use   Vaping status: Never Used  Substance and Sexual Activity   Alcohol use: No   Drug use: No   Sexual activity: Yes    Birth control/protection: None  Other Topics Concern   Not on file  Social History Narrative   Lives in [place].  Presently unemployed but attends school full time   Social Determinants of Health   Financial Resource Strain: Low  Risk  (05/06/2022)   Overall Financial Resource Strain (CARDIA)    Difficulty of Paying Living Expenses: Not hard at all  Food Insecurity: No Food Insecurity (05/06/2022)   Hunger Vital Sign    Worried About Running Out of Food in the Last Year: Never true    Ran Out of Food in the Last Year: Never true  Transportation Needs: No Transportation Needs (05/06/2022)   PRAPARE - Administrator, Civil Service (Medical): No    Lack of Transportation (Non-Medical): No  Physical Activity: Insufficiently Active (05/06/2022)   Exercise Vital Sign    Days of Exercise per Week: 3 days  Minutes of Exercise per Session: 30 min  Stress: No Stress Concern Present (05/06/2022)   [employer] of Occupational Health - Occupational Stress Questionnaire    Feeling of Stress : Not at all  Social Connections: Moderately Integrated (05/06/2022)   Social Connection and Isolation Panel [NHANES]    Frequency of Communication with Friends and Family: Twice a week    Frequency of Social Gatherings with Friends and Family: Once a week    Attends Religious Services: 1 to 4 times per year    Active Member of [employer] or Organizations: Yes    Attends Engineer, structural: 1 to 4 times per year    Marital Status: Divorced  Catering manager Violence: Not on file    Family History  Problem Relation Age of Onset   Diabetes Other        multiple   Heart failure Paternal Grandmother    Breast cancer Paternal Grandmother    Heart disease Other        multiple   Breast cancer Mother     Past Surgical History:  Procedure Laterality Date   BREAST BIOPSY Left 05/24/2022   MM LT BREAST BX W LOC DEV 1ST LESION IMAGE BX SPEC STEREO GUIDE 05/24/2022 [facility]   CERVIX LESION DESTRUCTION     DILATION AND CURETTAGE OF UTERUS     LEFT HEART CATHETERIZATION WITH CORONARY ANGIOGRAM N/A 05/21/2011   Procedure: LEFT HEART CATHETERIZATION WITH CORONARY ANGIOGRAM;  Surgeon: Tonny Bollman, MD;  Location:  [facility] CATH LAB;  Service: Cardiovascular;  Laterality: N/A;   TUBAL LIGATION  1996    ROS: Review of Systems Negative except as stated above  PHYSICAL EXAM: BP (!) 145/82 (BP Location: Left Arm, Patient Position: Sitting, Cuff Size: Large)   Pulse 85   Ht 5\' 9"  (1.753 m)   Wt 213 lb (96.6 kg)   SpO2 97%   BMI 31.45 kg/m   Wt Readings from Last 3 Encounters:  10/04/22 213 lb (96.6 kg)  05/28/22 211 lb (95.7 kg)  05/10/22 217 lb (98.4 kg)  .  Blood pressure 148/86  Physical Exam  General appearance - alert, well appearing, and in no distress Mental status - normal mood, behavior, speech, dress, motor activity, and thought processes Neck - supple, no significant adenopathy Chest - clear to auscultation, no wheezes, rales or rhonchi, symmetric air entry Heart - normal rate, regular rhythm, normal S1, S2, no murmurs, rubs, clicks or gallops Extremities - peripheral pulses normal, no pedal edema, no clubbing or cyanosis      Latest Ref Rng & Units 02/26/2022    9:55 AM 12/19/2021    8:43 AM 10/23/2021    3:53 PM  CMP  Glucose 70 - 99 mg/dL 161     BUN 6 - 24 mg/dL 19     Creatinine 0.96 - 1.00 mg/dL 0.45     Sodium 409 - 811 mmol/L 138     Potassium 3.5 - 5.2 mmol/L 4.6     Chloride 96 - 106 mmol/L 97     CO2 20 - 29 mmol/L 25     Calcium 8.7 - 10.2 mg/dL 9.7     Total Protein 6.0 - 8.5 g/dL  6.7  7.0   Total Bilirubin 0.0 - 1.2 mg/dL  0.9  0.6   Alkaline Phos 44 - 121 IU/L  90  99   AST 0 - 40 IU/L  15  11   ALT 0 - 32 IU/L  15  14    Lipid Panel     Component Value Date/Time   CHOL 207 (H) 12/19/2021 0843   TRIG 76 12/19/2021 0843   HDL 48 12/19/2021 0843   CHOLHDL 4.3 12/19/2021 0843   CHOLHDL 6.4 (H) 05/05/2015 1645   VLDL 37 (H) 05/05/2015 1645   LDLCALC 145 (H) 12/19/2021 0843    CBC    Component Value Date/Time   WBC 6.9 02/26/2022 0955   WBC 8.9 07/25/2017 0837   RBC 4.52 02/26/2022 0955   RBC 3.71 (L) 07/25/2017 0837   HGB 12.4 02/26/2022 0955    HCT 39.3 02/26/2022 0955   PLT 208 02/26/2022 0955   MCV 87 02/26/2022 0955   MCH 27.4 02/26/2022 0955   MCH 26.4 07/25/2017 0837   MCHC 31.6 02/26/2022 0955   MCHC 30.6 07/25/2017 0837   RDW 13.1 02/26/2022 0955   LYMPHSABS 2.5 07/31/2017 1225   MONOABS 0.6 06/02/2017 0530   EOSABS 0.2 07/31/2017 1225   BASOSABS 0.1 07/31/2017 1225    ASSESSMENT AND PLAN: 1. Type 2 diabetes mellitus with obesity (HCC) Not at goal. Discussed and encourage healthy eating habits.  Referred to nutritionist. Increase Trulicity to 1.5 mg once a week.  Went over with her again possible side effects to look out for.  Advised to stop the medicine and be seen if she develops vomiting, pain in the abdomen, pain in the abdomen and unable to move the bowels, significant diarrhea.  Continue metformin 500 mg once a day and Lantus insulin 40 units daily. - POCT glycosylated hemoglobin (Hb A1C) - POCT glucose (manual entry) - Ambulatory referral to Ophthalmology - Amb ref to Medical Nutrition Therapy-MNT - Dulaglutide (TRULICITY) 1.5 MG/0.5ML SOPN; Inject 1.5 mg into the skin once a week.  Dispense: 2 mL; Refill: 6 - metFORMIN (GLUCOPHAGE-XR) 500 MG 24 hr tablet; Take 1 tablet (500 mg total) by mouth daily with breakfast.  Dispense: 90 tablet; Refill: 1 - Lipid panel - Comprehensive metabolic panel  2. Hypertension associated with diabetes (HCC) Not at goal.  Patient forgot to take her medications today.  Encouraged her to get a weekly med box and set up her meds.  Continue carvedilol (med list has 12.5 mg but pt states she is on 25 mg BID.  Did not bring meds with her today), hydralazine 50 mg 3 times a day, isosorbide 30 mg daily, spironolactone 25 mg daily.  Advised to bring medicines with her on next visit.  3. Dyslipidemia associated with type 2 diabetes mellitus (HCC) Continue Crestor and Zetia.  Overdue for lipid profile recheck - Lipid panel  4. Chronic combined systolic and diastolic CHF, NYHA class 1  (HCC) Stable.  Continue carvedilol, hydralazine and isosorbide.  5. Mild intermittent asthma without complication - albuterol (VENTOLIN HFA) 108 (90 Base) MCG/ACT inhaler; Inhale 2 puffs into the lungs every 6 (six) hours as needed for wheezing or shortness of breath.  Dispense: 18 g; Refill: 6  6. Screening for colon cancer - Cologuard     Patient was given the opportunity to ask questions.  Patient verbalized understanding of the plan and was able to repeat key elements of the plan.   This documentation was completed using Paediatric nurse.  Any transcriptional errors are unintentional.  Orders Placed This Encounter  Procedures   Cologuard   Lipid panel   Comprehensive metabolic panel   Ambulatory referral to Ophthalmology   Amb ref to Medical Nutrition Therapy-MNT   POCT glycosylated hemoglobin (Hb A1C)  POCT glucose (manual entry)     Requested Prescriptions   Signed Prescriptions Disp Refills   albuterol (VENTOLIN HFA) 108 (90 Base) MCG/ACT inhaler 18 g 6    Sig: Inhale 2 puffs into the lungs every 6 (six) hours as needed for wheezing or shortness of breath.   Dulaglutide (TRULICITY) 1.5 MG/0.5ML SOPN 2 mL 6    Sig: Inject 1.5 mg into the skin once a week.   metFORMIN (GLUCOPHAGE-XR) 500 MG 24 hr tablet 90 tablet 1    Sig: Take 1 tablet (500 mg total) by mouth daily with breakfast.    Return in about 4 months (around 02/03/2023).  Jonah Blue, MD, FACP

## 2022-10-05 LAB — COMPREHENSIVE METABOLIC PANEL
ALT: 35 IU/L — ABNORMAL HIGH (ref 0–32)
AST: 29 IU/L (ref 0–40)
Albumin: 4.2 g/dL (ref 3.8–4.9)
Alkaline Phosphatase: 100 IU/L (ref 44–121)
BUN/Creatinine Ratio: 15 (ref 9–23)
BUN: 17 mg/dL (ref 6–24)
Bilirubin Total: 0.7 mg/dL (ref 0.0–1.2)
CO2: 26 mmol/L (ref 20–29)
Calcium: 9.7 mg/dL (ref 8.7–10.2)
Chloride: 100 mmol/L (ref 96–106)
Creatinine, Ser: 1.11 mg/dL — ABNORMAL HIGH (ref 0.57–1.00)
Globulin, Total: 2.4 g/dL (ref 1.5–4.5)
Glucose: 160 mg/dL — ABNORMAL HIGH (ref 70–99)
Potassium: 3.7 mmol/L (ref 3.5–5.2)
Sodium: 139 mmol/L (ref 134–144)
Total Protein: 6.6 g/dL (ref 6.0–8.5)
eGFR: 59 mL/min/{1.73_m2} — ABNORMAL LOW (ref >59)

## 2022-10-05 LAB — LIPID PANEL
Chol/HDL Ratio: 3.1 ratio (ref 0.0–4.4)
Cholesterol, Total: 123 mg/dL (ref 100–199)
HDL: 40 mg/dL (ref >39)
LDL Chol Calc (NIH): 65 mg/dL (ref 0–99)
Triglycerides: 91 mg/dL (ref 0–149)
VLDL Cholesterol Cal: 18 mg/dL (ref 5–40)

## 2022-10-20 ENCOUNTER — Other Ambulatory Visit: Payer: Self-pay | Admitting: Internal Medicine

## 2022-10-20 DIAGNOSIS — E1142 Type 2 diabetes mellitus with diabetic polyneuropathy: Secondary | ICD-10-CM

## 2022-10-23 ENCOUNTER — Other Ambulatory Visit: Payer: Self-pay | Admitting: Internal Medicine

## 2022-10-23 ENCOUNTER — Telehealth: Payer: Self-pay | Admitting: Cardiology

## 2022-10-23 DIAGNOSIS — E1142 Type 2 diabetes mellitus with diabetic polyneuropathy: Secondary | ICD-10-CM

## 2022-10-23 DIAGNOSIS — E1159 Type 2 diabetes mellitus with other circulatory complications: Secondary | ICD-10-CM

## 2022-10-23 NOTE — Telephone Encounter (Signed)
*  STAT* If patient is at the pharmacy, call can be transferred to refill team.   1. Which medications need to be refilled? (please list name of each medication and dose if known)   furosemide (LASIX) 40 MG tablet     2. Would you like to learn more about the convenience, safety, & potential cost savings by using the [facility]? No   3. Are you open to using the [facility] No  4. Which pharmacy/location (including street and city if local pharmacy) is medication to be sent to? [id number] - [place], [place] - [address] AT [facility] OF [address]     5. Do they need a 30 day or 90 day supply? 90 Day Supply

## 2022-10-24 ENCOUNTER — Other Ambulatory Visit: Payer: Self-pay | Admitting: Internal Medicine

## 2022-10-24 DIAGNOSIS — E1142 Type 2 diabetes mellitus with diabetic polyneuropathy: Secondary | ICD-10-CM

## 2022-10-24 MED ORDER — LANTUS SOLOSTAR 100 UNIT/ML ~~LOC~~ SOPN: 40.0000 [IU] | PEN_INJECTOR | Freq: Every day | SUBCUTANEOUS | 1 refills | Status: DC

## 2022-10-24 NOTE — Telephone Encounter (Signed)
Looks like another MD sent refill in 10/23/22.

## 2022-10-25 ENCOUNTER — Telehealth: Payer: Self-pay

## 2022-10-25 DIAGNOSIS — E1142 Type 2 diabetes mellitus with diabetic polyneuropathy: Secondary | ICD-10-CM

## 2022-10-25 MED ORDER — LANTUS SOLOSTAR 100 UNIT/ML ~~LOC~~ SOPN: 40.0000 [IU] | PEN_INJECTOR | Freq: Every day | SUBCUTANEOUS | 6 refills | Status: DC

## 2022-10-25 NOTE — Telephone Encounter (Signed)
Refill sent on mantis to her pharmacy.

## 2022-10-25 NOTE — Addendum Note (Signed)
Addended by: Jonah Blue B on: 10/25/2022 06:35 PM   Modules accepted: Orders

## 2022-10-25 NOTE — Telephone Encounter (Signed)
Copied from CRM [id number]. Topic: General - Inquiry >> Oct 25, 2022  3:30 PM [name] wrote: Reason for CRM: pt called saying the pharmacy told her she needed to call the office and tell them she needs a 90 day supply of the [employer].  She is completely out of her insulin. She usually gets it at [facility] and they are the ones who told her she would have to get the 90 days   She seen the provider last week so she does not need to see the provider again.  CB@  [phone]

## 2022-10-26 NOTE — Telephone Encounter (Signed)
Called & spoke to the patient. Verified name & DOB. Informed that refill of Lantus has been sent to her pharmacy. Patient is very grateful and expressed verbal understanding. No further questions at this time.

## 2022-10-26 NOTE — Progress Notes (Signed)
HPI: FU CM/CHF. Echocardiogram in April of 2013 showed an ejection fraction of 35%, moderate left ventricular enlargement, mild to moderate mitral regurgitation. HIV negative and TSH normal. Cardiac catheterization in April of 2013 showed normal coronary arteries and an ejection fraction of 35-40%. Patient was treated with medications and her cardiomyopathy was felt possibly secondary to hypertension. Echocardiogram repeated in September of 2013. Her ejection fraction was 30-35%. Patient referred to Dr. Johney Frame for consideration of ICD but the patient decided not to pursue. Also with h/o carotid dissection healed on CTA 4/18. Echocardiogram June 2024 showed ejection fraction 35 to 40%, mild left ventricular hypertrophy, grade 1 diastolic dysfunction, mild to moderate left atrial enlargement, moderate mitral regurgitation.  Since last seen, she occasionally has dyspnea that improves with her inhaler.  No orthopnea, PND, pedal edema, chest pain or syncope.  Current Outpatient Medications  Medication Sig Dispense Refill   albuterol (VENTOLIN HFA) 108 (90 Base) MCG/ACT inhaler Inhale 2 puffs into the lungs every 6 (six) hours as needed for wheezing or shortness of breath. 18 g 6   carvedilol (COREG) 25 MG tablet Take 25 mg by mouth 2 (two) times daily.     Dulaglutide (TRULICITY) 1.5 MG/0.5ML SOPN Inject 1.5 mg into the skin once a week. 2 mL 6   ezetimibe (ZETIA) 10 MG tablet Take 1 tablet (10 mg total) by mouth daily. 90 tablet 3   furosemide (LASIX) 40 MG tablet TAKE 1 TABLET BY MOUTH EVERY DAY 90 tablet 0   hydrALAZINE (APRESOLINE) 50 MG tablet Take 1 tablet (50 mg total) by mouth 3 (three) times daily. 270 tablet 3   isosorbide mononitrate (IMDUR) 30 MG 24 hr tablet Take 1 tablet (30 mg total) by mouth daily. 90 tablet 3   LANTUS SOLOSTAR 100 UNIT/ML Solostar Pen Inject 40 Units into the skin daily. 45 mL 6   metFORMIN (GLUCOPHAGE-XR) 500 MG 24 hr tablet Take 1 tablet (500 mg total) by mouth  daily with breakfast. 90 tablet 1   rosuvastatin (CRESTOR) 40 MG tablet Take 1 tablet (40 mg total) by mouth daily. 90 tablet 3   spironolactone (ALDACTONE) 25 MG tablet Take 1 tablet (25 mg total) by mouth daily. 90 tablet 3   Accu-Chek Softclix Lancets lancets Use to check blood sugar up to 3 times daily. E11.42 (Patient not taking: Reported on 11/06/2022) 100 each 2   albuterol (PROVENTIL) (2.5 MG/3ML) 0.083% nebulizer solution USE 3 MILLILITERS IN NEBULIZER EVERY 6 HOURS AS NEEDED FOR WHEEZING FOR SHORTNESS OF BREATH (Patient not taking: Reported on 10/04/2022) 75 mL 0   aspirin 81 MG chewable tablet Chew 1 tablet (81 mg total) by mouth daily. (Patient not taking: Reported on 11/06/2022) 30 tablet 3   blood glucose meter kit and supplies Dispense based on patient and insurance preference. Use up to four times daily as directed. (FOR ICD-9 250.00, 250.01). (Patient not taking: Reported on 11/06/2022) 1 each 0   Blood Glucose Monitoring Suppl (ACCU-CHEK GUIDE ME) w/Device KIT 1 kit by Does not apply route 3 (three) times daily. Use to check blood sugar up to 3 times daily. (Patient not taking: Reported on 11/06/2022) 1 kit 0   carvedilol (COREG) 12.5 MG tablet Take 12.5 mg by mouth 2 (two) times daily with a meal. (Patient not taking: Reported on 11/06/2022)     Cholecalciferol (VITAMIN D3) 10 MCG (400 UNIT) tablet Take 1 tablet (400 Units total) by mouth daily. (Patient not taking: Reported on 10/04/2022) 100 tablet 1  Continuous Blood Gluc Transmit (DEXCOM G6 TRANSMITTER) MISC 1 packet by Does not apply route daily. (Patient not taking: Reported on 10/04/2022) 1 each 4   DULoxetine (CYMBALTA) 20 MG capsule Take 1 capsule (20 mg total) by mouth daily. (Patient not taking: Reported on 11/06/2022) 30 capsule 5   gabapentin (NEURONTIN) 100 MG capsule Take 2 capsules (200 mg total) by mouth at bedtime. For diabetic neuropathy symptoms (Patient not taking: Reported on 10/04/2022) 180 capsule 2   glucose blood  (ACCU-CHEK GUIDE) test strip Use as instructed to check blood sugar up to 3 times daily. (Patient not taking: Reported on 11/06/2022) 100 each 11   TRUEPLUS PEN NEEDLES 31G X 6 MM MISC USE AS DIRECTED (Patient not taking: Reported on 11/06/2022) 100 each 1   No current facility-administered medications for this visit.     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, now resolved   Systolic CHF (HCC)    Echo 05/19/11 EF 35% w/ mild-mod MR    Past Surgical History:  Procedure Laterality Date   BREAST BIOPSY Left 05/24/2022   MM LT BREAST BX W LOC DEV 1ST LESION IMAGE BX SPEC STEREO GUIDE 05/24/2022 [facility]   CERVIX LESION DESTRUCTION     DILATION AND CURETTAGE OF UTERUS     LEFT HEART CATHETERIZATION WITH CORONARY ANGIOGRAM N/A 05/21/2011   Procedure: LEFT HEART CATHETERIZATION WITH CORONARY ANGIOGRAM;  Surgeon: Tonny Bollman, MD;  Location: [facility] CATH LAB;  Service: Cardiovascular;  Laterality: N/A;   TUBAL LIGATION  1996    Social History   Socioeconomic History   Marital status: Single    Spouse name: Not on file   Number of children: 2   Years of education: Not on file   Highest education level: Associate degree: academic program  Occupational History   Not on file  Tobacco Use   Smoking status: Never   Smokeless tobacco: Never  Vaping Use   Vaping status: Never Used  Substance and Sexual Activity    Alcohol use: No   Drug use: No   Sexual activity: Yes    Birth control/protection: None  Other Topics Concern   Not on file  Social History Narrative   Lives in [place].  Presently unemployed but attends school full time   Social Determinants of Health   Financial Resource Strain: Low Risk  (05/06/2022)   Overall Financial Resource Strain (CARDIA)    Difficulty of Paying Living Expenses: Not hard at all  Food Insecurity: No Food Insecurity (05/06/2022)   Hunger Vital Sign    Worried About Running Out of Food in the Last Year: Never true    Ran Out of Food in the Last Year: Never true  Transportation Needs: No Transportation Needs (05/06/2022)   PRAPARE - Administrator, Civil Service (Medical): No    Lack of Transportation (Non-Medical): No  Physical Activity: Insufficiently  Active (05/06/2022)   Exercise Vital Sign    Days of Exercise per Week: 3 days    Minutes of Exercise per Session: 30 min  Stress: No Stress Concern Present (05/06/2022)   [employer] of Occupational Health - Occupational Stress Questionnaire    Feeling of Stress : Not at all  Social Connections: Moderately Integrated (05/06/2022)   Social Connection and Isolation Panel [NHANES]    Frequency of Communication with Friends and Family: Twice a week    Frequency of Social Gatherings with Friends and Family: Once a week    Attends Religious Services: 1 to 4 times per year    Active Member of [employer] or Organizations: Yes    Attends Banker Meetings: 1 to 4 times per year    Marital Status: Divorced  Catering manager Violence: Not on file    Family History  Problem Relation Age of Onset   Diabetes Other        multiple   Heart failure Paternal Grandmother    Breast cancer Paternal Grandmother    Heart disease Other        multiple   Breast cancer Mother     ROS: no fevers or chills, productive cough, hemoptysis, dysphasia, odynophagia, melena, hematochezia, dysuria,  hematuria, rash, seizure activity, orthopnea, PND, pedal edema, claudication. Remaining systems are negative.  Physical Exam: Well-developed well-nourished in no acute distress.  Skin is warm and dry.  HEENT is normal.  Neck is supple.  Chest is clear to auscultation with normal expansion.  Cardiovascular exam is regular rate and rhythm.  Abdominal exam nontender or distended. No masses palpated. Extremities show no edema. neuro grossly intact  A/P  1 cardiomyopathy-continue hydralazine/nitrates (previous angioedema with ACE inhibitors) as well as carvedilol.  We will plan cardiac MRI June 2025 to reassess LV function and mitral regurgitation.  This will also rule out infiltrative cardiomyopathy.  2 chronic combined systolic/diastolic congestive heart failure-continue Lasix and spironolactone.   3 mitral regurgitation-we will plan follow-up cardiac MRI June 2025 as outlined above both to reassess mitral regurgitation and rule out infiltrative cardiomyopathy.  4 hypertension-blood pressure controlled.  Continue present medications.  5 hyperlipidemia-continue Crestor and Zetia.  6 history of left common carotid artery dissection-improved/resolved on most recent CT scan.  Olga Millers, MD

## 2022-11-06 ENCOUNTER — Ambulatory Visit: Payer: PRIVATE HEALTH INSURANCE | Attending: Cardiology | Admitting: Cardiology

## 2022-11-06 ENCOUNTER — Encounter: Payer: Self-pay | Admitting: Cardiology

## 2022-11-06 VITALS — BP 134/68 | HR 92 | Ht 69.0 in | Wt 210.6 lb

## 2022-11-06 DIAGNOSIS — I5022 Chronic systolic (congestive) heart failure: Secondary | ICD-10-CM

## 2022-11-06 DIAGNOSIS — I428 Other cardiomyopathies: Secondary | ICD-10-CM

## 2022-11-06 DIAGNOSIS — I10 Essential (primary) hypertension: Secondary | ICD-10-CM

## 2022-11-06 DIAGNOSIS — I340 Nonrheumatic mitral (valve) insufficiency: Secondary | ICD-10-CM

## 2022-11-06 DIAGNOSIS — E785 Hyperlipidemia, unspecified: Secondary | ICD-10-CM

## 2022-11-06 NOTE — Patient Instructions (Signed)

## 2022-12-11 ENCOUNTER — Observation Stay (HOSPITAL_COMMUNITY)
Admission: EM | Admit: 2022-12-11 | Discharge: 2022-12-12 | Disposition: A | Discharge status: Home / Self Care | Payer: PRIVATE HEALTH INSURANCE | Attending: Internal Medicine | Admitting: Internal Medicine

## 2022-12-11 ENCOUNTER — Encounter (HOSPITAL_COMMUNITY): Payer: Self-pay

## 2022-12-11 ENCOUNTER — Other Ambulatory Visit: Payer: Self-pay

## 2022-12-11 ENCOUNTER — Ambulatory Visit: Payer: Self-pay

## 2022-12-11 ENCOUNTER — Observation Stay (HOSPITAL_COMMUNITY): Payer: PRIVATE HEALTH INSURANCE

## 2022-12-11 ENCOUNTER — Emergency Department (HOSPITAL_COMMUNITY): Payer: PRIVATE HEALTH INSURANCE

## 2022-12-11 ENCOUNTER — Telehealth: Payer: Self-pay

## 2022-12-11 ENCOUNTER — Telehealth: Payer: PRIVATE HEALTH INSURANCE | Admitting: Physician Assistant

## 2022-12-11 DIAGNOSIS — E1165 Type 2 diabetes mellitus with hyperglycemia: Secondary | ICD-10-CM | POA: Insufficient documentation

## 2022-12-11 DIAGNOSIS — J45909 Unspecified asthma, uncomplicated: Secondary | ICD-10-CM | POA: Insufficient documentation

## 2022-12-11 DIAGNOSIS — Z79899 Other long term (current) drug therapy: Secondary | ICD-10-CM | POA: Insufficient documentation

## 2022-12-11 DIAGNOSIS — I6320 Cerebral infarction due to unspecified occlusion or stenosis of unspecified precerebral arteries: Secondary | ICD-10-CM

## 2022-12-11 DIAGNOSIS — R42 Dizziness and giddiness: Principal | ICD-10-CM

## 2022-12-11 DIAGNOSIS — I639 Cerebral infarction, unspecified: Secondary | ICD-10-CM | POA: Diagnosis not present

## 2022-12-11 DIAGNOSIS — M79605 Pain in left leg: Secondary | ICD-10-CM

## 2022-12-11 DIAGNOSIS — Z7901 Long term (current) use of anticoagulants: Secondary | ICD-10-CM | POA: Insufficient documentation

## 2022-12-11 DIAGNOSIS — R2689 Other abnormalities of gait and mobility: Secondary | ICD-10-CM

## 2022-12-11 DIAGNOSIS — I5022 Chronic systolic (congestive) heart failure: Secondary | ICD-10-CM | POA: Diagnosis not present

## 2022-12-11 DIAGNOSIS — E119 Type 2 diabetes mellitus without complications: Secondary | ICD-10-CM

## 2022-12-11 DIAGNOSIS — Z7984 Long term (current) use of oral hypoglycemic drugs: Secondary | ICD-10-CM | POA: Diagnosis not present

## 2022-12-11 DIAGNOSIS — E1169 Type 2 diabetes mellitus with other specified complication: Secondary | ICD-10-CM | POA: Diagnosis present

## 2022-12-11 DIAGNOSIS — I63542 Cerebral infarction due to unspecified occlusion or stenosis of left cerebellar artery: Secondary | ICD-10-CM | POA: Diagnosis not present

## 2022-12-11 DIAGNOSIS — Z7982 Long term (current) use of aspirin: Secondary | ICD-10-CM | POA: Insufficient documentation

## 2022-12-11 DIAGNOSIS — I152 Hypertension secondary to endocrine disorders: Secondary | ICD-10-CM | POA: Diagnosis present

## 2022-12-11 DIAGNOSIS — E669 Obesity, unspecified: Secondary | ICD-10-CM

## 2022-12-11 DIAGNOSIS — I110 Hypertensive heart disease with heart failure: Secondary | ICD-10-CM | POA: Insufficient documentation

## 2022-12-11 DIAGNOSIS — I4581 Long QT syndrome: Secondary | ICD-10-CM | POA: Insufficient documentation

## 2022-12-11 DIAGNOSIS — Z794 Long term (current) use of insulin: Secondary | ICD-10-CM | POA: Diagnosis not present

## 2022-12-11 DIAGNOSIS — E876 Hypokalemia: Secondary | ICD-10-CM | POA: Insufficient documentation

## 2022-12-11 DIAGNOSIS — I5042 Chronic combined systolic (congestive) and diastolic (congestive) heart failure: Secondary | ICD-10-CM

## 2022-12-11 DIAGNOSIS — E1159 Type 2 diabetes mellitus with other circulatory complications: Secondary | ICD-10-CM | POA: Diagnosis present

## 2022-12-11 LAB — URINALYSIS, ROUTINE W REFLEX MICROSCOPIC
Bilirubin Urine: NEGATIVE
Glucose, UA: NEGATIVE mg/dL
Hgb urine dipstick: NEGATIVE
Ketones, ur: NEGATIVE mg/dL
Leukocytes,Ua: NEGATIVE
Nitrite: NEGATIVE
Protein, ur: NEGATIVE mg/dL
Specific Gravity, Urine: 1.013 (ref 1.005–1.030)
pH: 5 (ref 5.0–8.0)

## 2022-12-11 LAB — BASIC METABOLIC PANEL
Anion gap: 9 (ref 5–15)
BUN: 16 mg/dL (ref 6–20)
CO2: 28 mmol/L (ref 22–32)
Calcium: 9.1 mg/dL (ref 8.9–10.3)
Chloride: 99 mmol/L (ref 98–111)
Creatinine, Ser: 0.99 mg/dL (ref 0.44–1.00)
GFR, Estimated: >60 mL/min (ref >60)
Glucose, Bld: 286 mg/dL — ABNORMAL HIGH (ref 70–99)
Potassium: 3.8 mmol/L (ref 3.5–5.1)
Sodium: 136 mmol/L (ref 135–145)

## 2022-12-11 LAB — I-STAT CHEM 8, ED
BUN: 17 mg/dL (ref 6–20)
Calcium, Ion: 1.18 mmol/L (ref 1.15–1.40)
Chloride: 98 mmol/L (ref 98–111)
Creatinine, Ser: 1.1 mg/dL — ABNORMAL HIGH (ref 0.44–1.00)
Glucose, Bld: 284 mg/dL — ABNORMAL HIGH (ref 70–99)
HCT: 37 % (ref 36.0–46.0)
Hemoglobin: 12.6 g/dL (ref 12.0–15.0)
Potassium: 3.6 mmol/L (ref 3.5–5.1)
Sodium: 137 mmol/L (ref 135–145)
TCO2: 27 mmol/L (ref 22–32)

## 2022-12-11 LAB — CBG MONITORING, ED
Glucose-Capillary: 281 mg/dL — ABNORMAL HIGH (ref 70–99)
Glucose-Capillary: 102 mg/dL — ABNORMAL HIGH (ref 70–99)

## 2022-12-11 LAB — CBC
HCT: 37.9 % (ref 36.0–46.0)
Hemoglobin: 12.1 g/dL (ref 12.0–15.0)
MCH: 27.7 pg (ref 26.0–34.0)
MCHC: 31.9 g/dL (ref 30.0–36.0)
MCV: 86.7 fL (ref 80.0–100.0)
Platelets: 329 10*3/uL (ref 150–400)
RBC: 4.37 MIL/uL (ref 3.87–5.11)
RDW: 14.4 % (ref 11.5–15.5)
WBC: 7.7 10*3/uL (ref 4.0–10.5)
nRBC: 0 % (ref 0.0–0.2)

## 2022-12-11 LAB — MAGNESIUM: Magnesium: 1.7 mg/dL (ref 1.7–2.4)

## 2022-12-11 LAB — GLUCOSE, CAPILLARY: Glucose-Capillary: 162 mg/dL — ABNORMAL HIGH (ref 70–99)

## 2022-12-11 LAB — HCG, SERUM, QUALITATIVE: Preg, Serum: NEGATIVE

## 2022-12-11 MED ORDER — EZETIMIBE 10 MG PO TABS
10.0000 mg | ORAL_TABLET | Freq: Every day | ORAL | Status: DC
  Administered 2022-12-12: 10 mg via ORAL
  Filled 2022-12-11: qty 1

## 2022-12-11 MED ORDER — INSULIN ASPART 100 UNIT/ML IJ SOLN: 0.0000 [IU] | Freq: Every day | INTRAMUSCULAR | Status: DC

## 2022-12-11 MED ORDER — ACETAMINOPHEN 160 MG/5ML PO SOLN: 650.0000 mg | ORAL | Status: DC | PRN

## 2022-12-11 MED ORDER — SENNOSIDES-DOCUSATE SODIUM 8.6-50 MG PO TABS: 1.0000 | ORAL_TABLET | Freq: Every evening | ORAL | Status: DC | PRN

## 2022-12-11 MED ORDER — LORAZEPAM 2 MG/ML IJ SOLN
1.0000 mg | Freq: Every day | INTRAMUSCULAR | Status: DC | PRN
  Administered 2022-12-11: 1 mg via INTRAVENOUS
  Filled 2022-12-11: qty 1

## 2022-12-11 MED ORDER — IOHEXOL 350 MG/ML SOLN: 75.0000 mL | Freq: Once | INTRAVENOUS | Status: DC | PRN

## 2022-12-11 MED ORDER — ENOXAPARIN SODIUM 40 MG/0.4ML IJ SOSY
40.0000 mg | PREFILLED_SYRINGE | INTRAMUSCULAR | Status: DC
Start: 2022-12-11 — End: 2022-12-12
  Administered 2022-12-11: 40 mg via SUBCUTANEOUS
  Filled 2022-12-11: qty 0.4

## 2022-12-11 MED ORDER — GADOBUTROL 1 MMOL/ML IV SOLN
10.0000 mL | Freq: Once | INTRAVENOUS | Status: AC | PRN
  Administered 2022-12-11: 10 mL via INTRAVENOUS

## 2022-12-11 MED ORDER — LIVING WELL WITH DIABETES BOOK
Freq: Once | Status: AC
  Filled 2022-12-11: qty 1

## 2022-12-11 MED ORDER — CARVEDILOL 12.5 MG PO TABS: 12.5000 mg | ORAL_TABLET | Freq: Two times a day (BID) | ORAL | Status: DC

## 2022-12-11 MED ORDER — ROSUVASTATIN CALCIUM 20 MG PO TABS
40.0000 mg | ORAL_TABLET | Freq: Every day | ORAL | Status: DC
  Administered 2022-12-12: 40 mg via ORAL
  Filled 2022-12-11: qty 2

## 2022-12-11 MED ORDER — ACETAMINOPHEN 325 MG PO TABS: 650.0000 mg | ORAL_TABLET | ORAL | Status: DC | PRN

## 2022-12-11 MED ORDER — ACETAMINOPHEN 650 MG RE SUPP: 650.0000 mg | RECTAL | Status: DC | PRN

## 2022-12-11 MED ORDER — FUROSEMIDE 40 MG PO TABS
40.0000 mg | ORAL_TABLET | Freq: Every day | ORAL | Status: DC
  Administered 2022-12-12: 40 mg via ORAL
  Filled 2022-12-11: qty 1

## 2022-12-11 MED ORDER — INSULIN ASPART 100 UNIT/ML IJ SOLN
0.0000 [IU] | Freq: Three times a day (TID) | INTRAMUSCULAR | Status: DC
  Administered 2022-12-12: 3 [IU] via SUBCUTANEOUS
  Administered 2022-12-12: 5 [IU] via SUBCUTANEOUS
  Administered 2022-12-12: 3 [IU] via SUBCUTANEOUS

## 2022-12-11 MED ORDER — STROKE: EARLY STAGES OF RECOVERY BOOK
Freq: Once | Status: AC
Start: 2022-12-12 — End: 2022-12-12
  Filled 2022-12-11: qty 1

## 2022-12-11 MED ORDER — INSULIN GLARGINE-YFGN 100 UNIT/ML ~~LOC~~ SOLN
20.0000 [IU] | Freq: Every day | SUBCUTANEOUS | Status: DC
  Filled 2022-12-11 (×4): qty 0.2

## 2022-12-11 NOTE — Telephone Encounter (Signed)
  Chief Complaint: dizziness, headache, right leg pain Symptoms: CBG 338 BP 138/72 Frequency: High sugars since this weekend Disposition: [] ED /[] Urgent Care (no appt availability in office) / [] Appointment(In office/virtual)/ []  [facility]/ [] Home Care/ [] Refused Recommended Disposition /[] [place] [facility]/ []  Follow-up with PCP Additional Notes: pt forgot to take her weekly shot of Trulicity- made pt an appt for VV for leg pain no appts noted Reason for Disposition  [1] Blood glucose > 300 mg/dL (16.1 mmol/L) AND [0] two or more times in a row  Answer Assessment - Initial Assessment Questions 1. BLOOD GLUCOSE: "What is your blood glucose level?"      338 2. ONSET: "When did you check the blood glucose?"     This am  3. USUAL RANGE: "What is your glucose level usually?" (e.g., usual fasting morning value, usual evening value)     Running high 228-300 4. KETONES: "Do you check for ketones (urine or blood test strips)?" If Yes, ask: "What does the test show now?"      N/a 5. TYPE 1 or 2:  "Do you know what type of diabetes you have?"  (e.g., Type 1, Type 2, Gestational; doesn't know)      Type 2  6. INSULIN: "Do you take insulin?" "What type of insulin(s) do you use? What is the mode of delivery? (syringe, pen; injection or pump)?"      Lantus  no trulicity this week 7. DIABETES PILLS: "Do you take any pills for your diabetes?" If Yes, ask: "Have you missed taking any pills recently?"     Yes metformin 8. OTHER SYMPTOMS: "Do you have any symptoms?" (e.g., fever, frequent urination, difficulty breathing, dizziness, weakness, vomiting)     Dizziness, headache 9. PREGNANCY: "Is there any chance you are pregnant?" "When was your last menstrual period?"     N/a  Protocols used: Diabetes - High Blood Sugar-A-AH

## 2022-12-11 NOTE — ED Provider Notes (Cosign Needed Addendum)
[place] EMERGENCY DEPARTMENT AT [facility] Provider Note   CSN: [id number] Arrival date & time: 12/11/22  1117     History  Chief Complaint  Patient presents with   Dizziness    [name] is a 57 y.o. female history of stroke, asthma, diabetes, hypertension, nonischemic cardiomyopathy, CHF presented for dizziness and headache that is been present for the past 2 days.  Patient does note that before she started having symptoms she forgot to take her Trulicity on Sunday and believes this could be related to what is going on.  Patient notes that the past few days at work she has a left-sided headache that ultimately resolved after taking Tylenol and will last from 9 AM to 12 PM at work but does not experience at all supplies.  Patient notes that she did not take pain meds earlier today and said that she was drifting when she was walking.  Patient states this feels very different than when she did have a stroke and denies any other weakness, chest pain, shortness of breath, leg swelling, neck pain, vision changes, new onset weakness.  Patient is currently asymptomatic. Previous stroke was acute nonhemorrhagic left MCA territory ischemic infarcts in a watershed distribution and probable tiny remote bilateral cerebellar infarcts.  Last known normal: 8 AM today  Last echo: 07/23/2022 LVEF 35 to 40%, moderate mitral valve regurgitation  Home Medications Prior to Admission medications   Medication Sig Start Date End Date Taking? Authorizing Provider  Accu-Chek Softclix Lancets lancets Use to check blood sugar up to 3 times daily. E11.42 Patient not taking: Reported on 11/06/2022 12/15/19   Marcine Matar, MD  albuterol (PROVENTIL) (2.5 MG/3ML) 0.083% nebulizer solution USE 3 MILLILITERS IN NEBULIZER EVERY 6 HOURS AS NEEDED FOR WHEEZING FOR SHORTNESS OF BREATH Patient not taking: Reported on 10/04/2022 05/19/20   Marcine Matar, MD  albuterol (VENTOLIN HFA) 108 (90 Base)  MCG/ACT inhaler Inhale 2 puffs into the lungs every 6 (six) hours as needed for wheezing or shortness of breath. 10/04/22   Marcine Matar, MD  aspirin 81 MG chewable tablet Chew 1 tablet (81 mg total) by mouth daily. Patient not taking: Reported on 11/06/2022 07/26/17   Shon Hale, MD  blood glucose meter kit and supplies Dispense based on patient and insurance preference. Use up to four times daily as directed. (FOR ICD-9 250.00, 250.01). Patient not taking: Reported on 11/06/2022 07/31/17   Storm Frisk, MD  Blood Glucose Monitoring Suppl (ACCU-CHEK GUIDE ME) w/Device KIT 1 kit by Does not apply route 3 (three) times daily. Use to check blood sugar up to 3 times daily. Patient not taking: Reported on 11/06/2022 11/16/19   Marcine Matar, MD  carvedilol (COREG) 12.5 MG tablet Take 12.5 mg by mouth 2 (two) times daily with a meal. Patient not taking: Reported on 11/06/2022 07/18/20   [provider]  carvedilol (COREG) 25 MG tablet Take 25 mg by mouth 2 (two) times daily. 10/21/22   [provider]  Cholecalciferol (VITAMIN D3) 10 MCG (400 UNIT) tablet Take 1 tablet (400 Units total) by mouth daily. Patient not taking: Reported on 10/04/2022 03/22/21   Marcine Matar, MD  Continuous Blood Gluc Transmit (DEXCOM G6 TRANSMITTER) MISC 1 packet by Does not apply route daily. Patient not taking: Reported on 10/04/2022 03/21/21   Marcine Matar, MD  Dulaglutide (TRULICITY) 1.5 MG/0.5ML SOPN Inject 1.5 mg into the skin once a week. 10/04/22   Marcine Matar, MD  DULoxetine (CYMBALTA) 20 MG capsule Take 1 capsule (20 mg total) by mouth daily. Patient not taking: Reported on 11/06/2022 11/28/18   Marcine Matar, MD  ezetimibe (ZETIA) 10 MG tablet Take 1 tablet (10 mg total) by mouth daily. 02/26/22 02/21/23  Marcine Matar, MD  furosemide (LASIX) 40 MG tablet TAKE 1 TABLET BY MOUTH EVERY DAY 10/23/22   Marcine Matar, MD  gabapentin (NEURONTIN) 100 MG capsule Take 2  capsules (200 mg total) by mouth at bedtime. For diabetic neuropathy symptoms Patient not taking: Reported on 10/04/2022 02/26/22   Marcine Matar, MD  glucose blood (ACCU-CHEK GUIDE) test strip Use as instructed to check blood sugar up to 3 times daily. Patient not taking: Reported on 11/06/2022 11/16/19   Marcine Matar, MD  hydrALAZINE (APRESOLINE) 50 MG tablet Take 1 tablet (50 mg total) by mouth 3 (three) times daily. 02/26/22   Marcine Matar, MD  isosorbide mononitrate (IMDUR) 30 MG 24 hr tablet Take 1 tablet (30 mg total) by mouth daily. 02/26/22   Marcine Matar, MD  LANTUS SOLOSTAR 100 UNIT/ML Solostar Pen Inject 40 Units into the skin daily. 10/25/22   Marcine Matar, MD  metFORMIN (GLUCOPHAGE-XR) 500 MG 24 hr tablet Take 1 tablet (500 mg total) by mouth daily with breakfast. 10/04/22   Marcine Matar, MD  rosuvastatin (CRESTOR) 40 MG tablet Take 1 tablet (40 mg total) by mouth daily. 04/26/22   Lewayne Bunting, MD  spironolactone (ALDACTONE) 25 MG tablet Take 1 tablet (25 mg total) by mouth daily. 02/26/22   Marcine Matar, MD  TRUEPLUS PEN NEEDLES 31G X 6 MM MISC USE AS DIRECTED Patient not taking: Reported on 11/06/2022 12/11/17   Marcine Matar, MD      Allergies    Lisinopril, Sertraline, Tramadol, and Ibuprofen    Review of Systems   Review of Systems  Neurological:  Positive for dizziness.    Physical Exam Updated Vital Signs BP (!) 152/78 (BP Location: Right Arm)   Pulse 93   Temp (!) 97.5 F (36.4 C)   Resp 20   Ht 5\' 9"  (1.753 m)   Wt 96.6 kg   LMP  (LMP Unknown) Comment: over a year ago  SpO2 100%   BMI 31.45 kg/m  Physical Exam Vitals reviewed.  Constitutional:      General: She is not in acute distress. HENT:     Head: Normocephalic and atraumatic.  Eyes:     Extraocular Movements: Extraocular movements intact.     Conjunctiva/sclera: Conjunctivae normal.     Pupils: Pupils are equal, round, and reactive to light.   Cardiovascular:     Rate and Rhythm: Normal rate and regular rhythm.     Pulses: Normal pulses.     Heart sounds: Normal heart sounds.     Comments: 2+ bilateral radial/dorsalis pedis pulses with regular rate Pulmonary:     Effort: Pulmonary effort is normal. No respiratory distress.     Breath sounds: Normal breath sounds.  Abdominal:     Palpations: Abdomen is soft.     Tenderness: There is no abdominal tenderness. There is no guarding or rebound.  Musculoskeletal:        General: Normal range of motion.     Cervical back: Normal range of motion and neck supple.     Comments: 5 out of 5 bilateral grip/leg extension strength  Skin:    General: Skin is warm and dry.  Capillary Refill: Capillary refill takes less than 2 seconds.  Neurological:     General: No focal deficit present.     Mental Status: She is alert and oriented to person, place, and time.     Sensory: Sensation is intact.     Motor: Motor function is intact.     Coordination: Coordination is intact.     Gait: Gait is intact.     Comments: Sensation intact in all 4 limbs Cranial nerves III through XII intact Vision grossly intact Hints exam was unremarkable  Psychiatric:        Mood and Affect: Mood normal.     ED Results / Procedures / Treatments   Labs (all labs ordered are listed, but only abnormal results are displayed) Labs Reviewed  CBG MONITORING, ED - Abnormal; Notable for the following components:      Result Value   Glucose-Capillary 281 (*)    All other components within normal limits  I-STAT CHEM 8, ED - Abnormal; Notable for the following components:   Creatinine, Ser 1.10 (*)    Glucose, Bld 284 (*)    All other components within normal limits  CBC  HCG, SERUM, QUALITATIVE  URINALYSIS, ROUTINE W REFLEX MICROSCOPIC    EKG EKG Interpretation Date/Time:  Tuesday December 11 2022 11:49:40 EDT Ventricular Rate:  87 PR Interval:  163 QRS Duration:  79 QT Interval:  479 QTC  Calculation: 577 R Axis:   67  Text Interpretation: Sinus rhythm Nonspecific T abnormalities, lateral leads Prolonged QT interval Confirmed by Alvester Chou [phone]) on 12/11/2022 12:12:54 PM  Radiology No results found.  Procedures .Critical Care  Performed by: Netta Corrigan, PA-C Authorized by: Netta Corrigan, PA-C   Critical care provider statement:    Critical care time (minutes):  30   Critical care time was exclusive of:  Separately billable procedures and treating other patients   Critical care was necessary to treat or prevent imminent or life-threatening deterioration of the following conditions:  CNS failure or compromise   Critical care was time spent personally by me on the following activities:  Blood draw for specimens, development of treatment plan with patient or surrogate, discussions with consultants, examination of patient, obtaining history from patient or surrogate, review of old charts, re-evaluation of patient's condition, pulse oximetry, ordering and review of radiographic studies, ordering and review of laboratory studies and ordering and performing treatments and interventions   I assumed direction of critical care for this patient from another provider in my specialty: no     Care discussed with comment:  Neurology     Medications Ordered in ED Medications - No data to display  ED Course/ Medical Decision Making/ A&P Clinical Course as of 12/11/22 1602  Tue Dec 11, 2022  1556 Discussed with radiology. Concern for acute CVA.  [CA]    Clinical Course User Index [CA] Mannie Stabile, PA-C                                 Medical Decision Making Amount and/or Complexity of Data Reviewed Labs: ordered. Radiology: ordered.  Risk Prescription drug management.   Lawrence Santiago 57 y.o. presented today for dizziness. Working DDx that I considered at this time includes, but not limited to, CVA/TIA, arrhythmia, CNS lesion, BPPV, vestibular  labyrinthitis, Meniere's, Ramsey-Hunt, polypharmacy, orthostatic hypotension, electrolyte abnormalities.  R/o DDx: pending  Review of prior external notes: 12/11/2022 video visit  Unique Tests and My Interpretation:  EKG: Tachycardia 73 medical: QT, nausea lesions or depressions noted CBC: Unremarkable BMP: Unremarkable UA: Unremarkable CT Head w/o Contrast: Pending  Social Determinants of Health: none  Discussion with Independent Historian: None  Discussion of Management of Tests:  Bhagat, MD Neurology  Risk: Medium: prescription drug management  Risk Stratification Score: None  Staffed with Trifan, MD  Plan: On exam patient was in no acute distress stable vitals.  Patient's physical was unremarkable and patient had stable vitals.  Patient's labs do show hyperglycemia of 284 however patient states that this is been normal for this week as she did miss her Trulicity which she thinks is related to her symptoms.  Patient's headache is described as left-sided and she is unable to say whether or not this is the same as previous headaches however with lack of other red flag symptoms this may be a migraine as he gets better with Tylenol at home and only occurs at work.  Patient stated that she was drifting this morning she was walking however was able to walk in a straight line here forward and backward and states this feels very different than when she did have a stroke and does not feel she is having a stroke.  Will obtain urine and scan head to rule out any intracranial abnormalities however given the fact the patient is asymptomatic and had reassuring physical exam and labs thus far do not believe patient is having stroke and will be safe to be discharged once everything is back.  Upon my independent interpretation of the CT scan there does appear to be an abnormality in the left anterior lobe that is different than when patient had her stroke previously in 2017.  I spoke to the  neurologist and they recommend doing an MRI with and without contrast.  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  Patient signed out to [place], [place].  Please review their note for the continuation of patient's care.  The plan at this point is follow-up on MRI and if abnormal contact neurology.   This chart was dictated using voice recognition software.  Despite best efforts to proofread,  errors can occur which can change the documentation meaning.  Final Clinical Impression(s) / ED Diagnoses Final diagnoses:  None    Rx / DC Orders ED Discharge Orders     None         Remi Deter 12/11/22 1604    Terald Sleeper, MD 12/12/22 [phone]

## 2022-12-11 NOTE — Telephone Encounter (Signed)
Noted  

## 2022-12-11 NOTE — Telephone Encounter (Signed)
Per Haley-Called patient to advise her to go to hospital due to her elevated blood sugar and multiple complaints. Pt voice understanding and stated she will head to [facility].

## 2022-12-11 NOTE — ED Notes (Signed)
ED TO INPATIENT HANDOFF REPORT  ED Nurse Name and Phone #:  Theadora Rama RN  S Name/Age/Gender [name] 57 y.o. female Room/Bed: 043C/043C  Code Status   Code Status: Full Code  Home/SNF/Other Home Patient oriented to: self, place, time, and situation Is this baseline? Yes   Triage Complete: Triage complete  Chief Complaint New cerebellar infarct [facility]) [I63.9]  Triage Note Pt states she was at work earlier today and felt dizzy and was staggering around. 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 Pt was told to come to the ED. Pt denies dizziness at this time. Pt denies pain, nausea, or vomiting at this time.    Allergies Allergies  Allergen Reactions   Lisinopril Hives and Swelling    Facial    Sertraline Hives and Swelling    Facial    Tramadol Hives and Swelling    facial   Ibuprofen Hives and Swelling    Level of Care/Admitting Diagnosis ED Disposition     ED Disposition  Admit   Condition  --   Comment  Hospital Area: [facility] [[id number]]  Level of Care: Telemetry Medical [104]  May place patient in observation at [facility] or [facility] if equivalent level of care is available:: No  Covid Evaluation: Asymptomatic - no recent exposure (last 10 days) testing not required  Diagnosis: New cerebellar infarct [facility]) [[id number]]  Admitting Physician: Charlsie Quest [[id number]]  Attending Physician: Charlsie Quest [[id number]]          B Medical/Surgery History Past Medical History:  Diagnosis Date   Asthma 05/31/2017   Cataracts, bilateral    Age-related   Diabetes mellitus, type 2 (HCC)    A1C 7.6 April '13   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, now resolved   Systolic CHF (HCC)    Echo 05/19/11 EF 35% w/ mild-mod MR   Past Surgical History:  Procedure Laterality Date   BREAST BIOPSY Left 05/24/2022   MM LT BREAST BX W LOC DEV 1ST LESION IMAGE BX SPEC STEREO GUIDE 05/24/2022 [facility] MAMMOGRAPHY   CERVIX LESION DESTRUCTION     DILATION AND CURETTAGE OF UTERUS     LEFT HEART CATHETERIZATION WITH CORONARY ANGIOGRAM N/A 05/21/2011   Procedure: LEFT HEART CATHETERIZATION WITH CORONARY ANGIOGRAM;  Surgeon: Tonny Bollman, MD;  Location: [facility] CATH LAB;  Service: Cardiovascular;  Laterality: N/A;   TUBAL LIGATION  1996     A IV Location/Drains/Wounds Patient Lines/Drains/Airways Status     Active Line/Drains/Airways     Name Placement date Placement time Site Days   Peripheral IV 12/11/22 22 G Right Antecubital 12/11/22  1615  Antecubital  less than 1            Intake/Output Last 24 hours No intake or output data in the 24 hours ending 12/11/22 2143  Labs/Imaging Results for orders placed or performed during the hospital encounter of 12/11/22 (from the past 48 hour(s))  Urinalysis, Routine w reflex microscopic -Urine, Clean Catch     Status: None  Collection Time: 12/11/22 11:25 AM  Result Value Ref Range   Color, Urine YELLOW YELLOW   APPearance CLEAR CLEAR   Specific Gravity, Urine 1.013 1.005 - 1.030   pH 5.0 5.0 - 8.0   Glucose, UA NEGATIVE NEGATIVE mg/dL   Hgb urine dipstick NEGATIVE NEGATIVE   Bilirubin Urine NEGATIVE NEGATIVE   Ketones, ur NEGATIVE NEGATIVE mg/dL   Protein, ur NEGATIVE NEGATIVE mg/dL   Nitrite NEGATIVE NEGATIVE   Leukocytes,Ua NEGATIVE NEGATIVE    Comment: Performed at [facility], [address], [place], [place] [address]  CBC     Status: None   Collection Time: 12/11/22 11:27 AM  Result Value Ref Range    WBC 7.7 4.0 - 10.5 K/uL   RBC 4.37 3.87 - 5.11 MIL/uL   Hemoglobin 12.1 12.0 - 15.0 g/dL   HCT 60.4 54.0 - 98.1 %   MCV 86.7 80.0 - 100.0 fL   MCH 27.7 26.0 - 34.0 pg   MCHC 31.9 30.0 - 36.0 g/dL   RDW 19.1 47.8 - 29.5 %   Platelets 329 150 - 400 K/uL   nRBC 0.0 0.0 - 0.2 %    Comment: Performed at [facility], [address] [place], [place] [address]  CBG monitoring, ED     Status: Abnormal   Collection Time: 12/11/22 11:27 AM  Result Value Ref Range   Glucose-Capillary 281 (H) 70 - 99 mg/dL    Comment: Glucose reference range applies only to samples taken after fasting for at least 8 hours.  hCG, serum, qualitative     Status: None   Collection Time: 12/11/22 11:27 AM  Result Value Ref Range   Preg, Serum NEGATIVE NEGATIVE    Comment:        THE SENSITIVITY OF THIS METHODOLOGY IS >10 mIU/mL. Performed at [facility], [address], [place], [place] [address]   Basic metabolic panel     Status: Abnormal   Collection Time: 12/11/22 11:27 AM  Result Value Ref Range   Sodium 136 135 - 145 mmol/L   Potassium 3.8 3.5 - 5.1 mmol/L   Chloride 99 98 - 111 mmol/L   CO2 28 22 - 32 mmol/L   Glucose, Bld 286 (H) 70 - 99 mg/dL    Comment: Glucose reference range applies only to samples taken after fasting for at least 8 hours.   BUN 16 6 - 20 mg/dL   Creatinine, Ser 4.69 0.44 - 1.00 mg/dL   Calcium 9.1 8.9 - 62.9 mg/dL   GFR, Estimated >52 >84 mL/min    Comment: (NOTE) Calculated using the CKD-EPI Creatinine Equation (2021)    Anion gap 9 5 - 15    Comment: Performed at [facility], [address], [place], [place] [address]  Magnesium     Status: None   Collection Time: 12/11/22 11:27 AM  Result Value Ref Range   Magnesium 1.7 1.7 - 2.4 mg/dL    Comment: Performed at [facility], [address] [place], [place] [address]  I-stat chem 8, ed     Status: Abnormal   Collection Time: 12/11/22 11:41 AM  Result Value Ref Range   Sodium 137 135 - 145  mmol/L   Potassium 3.6 3.5 - 5.1 mmol/L   Chloride 98 98 - 111 mmol/L   BUN 17 6 - 20 mg/dL   Creatinine, Ser 2.53 (H) 0.44 - 1.00 mg/dL   Glucose, Bld 664 (H) 70 - 99  mg/dL    Comment: Glucose reference range applies only to samples taken after fasting for at least 8 hours.   Calcium, Ion 1.18 1.15 - 1.40 mmol/L   TCO2 27 22 - 32 mmol/L   Hemoglobin 12.6 12.0 - 15.0 g/dL   HCT 40.9 81.1 - 91.4 %  CBG monitoring, ED     Status: Abnormal   Collection Time: 12/11/22  4:13 PM  Result Value Ref Range   Glucose-Capillary 102 (H) 70 - 99 mg/dL    Comment: Glucose reference range applies only to samples taken after fasting for at least 8 hours.   MR Brain W and Wo Contrast  Result Date: 12/11/2022 CLINICAL DATA:  Dizzy, previous history of stroke, possible infarct on CT EXAM: MRI HEAD WITHOUT AND WITH CONTRAST TECHNIQUE: Multiplanar, multiecho pulse sequences of the brain and surrounding structures were obtained without and with intravenous contrast. CONTRAST:  10mL GADAVIST GADOBUTROL 1 MMOL/ML IV SOLN COMPARISON:  MRI head 02/24/2015, correlation is made with same day CT head FINDINGS: Brain: Restricted diffusion with ADC correlate in the inferomedial left cerebellum (series 5, image 62 and series 7, image 51), which is associated with increased T2 hyperintense signal and correlates with the hypodense focus noted on the same-day CT. Additional peripheral focus of restricted diffusion with ADC correlate lateral left cerebellum (series 5, image 65), which is also associated with increased T2 hyperintense signal but is not particularly hypodense on the same-day CT. Enhancing 7 mm focus in the left cerebellum (series 16, image 10). No other abnormal parenchymal or meningeal enhancement. No acute hemorrhage, mass, mass effect, or midline shift. No hydrocephalus or extra-axial collection. Pituitary and craniocervical junction within normal limits. Remote infarcts in the left anterior frontal lobe and left  frontoparietal region. Additional remote lacunar infarcts in the bilateral cerebellar hemispheres Vascular: Normal arterial flow voids. Skull and upper cervical spine: Normal marrow signal. Sinuses/Orbits: Clear paranasal sinuses. No acute finding in the orbits. Other: Fluid in the right mastoid air cells. IMPRESSION: 1. Acute infarct in the inferomedial left cerebellum, which correlates with the hypodense focus noted on the same-day CT. Additional smaller acute infarct in the lateral left cerebellum. 2. Enhancing focus in the left cerebellum is favored to represent a subacute infarct, although an enhancing neoplastic lesion could appear similar. Attention on follow-up. These results were called by telephone at the time of interpretation on 12/11/2022 at 7:47 pm to provider Claudette Stapler, PA, who verbally acknowledged these results. Electronically Signed   By: Wiliam Ke M.D.   On: 12/11/2022 19:49   CT Head Wo Contrast  Result Date: 12/11/2022 CLINICAL DATA:  dizzy, HA, h/o stroke EXAM: CT HEAD WITHOUT CONTRAST TECHNIQUE: Contiguous axial images were obtained from the base of the skull through the vertex without intravenous contrast. RADIATION DOSE REDUCTION: This exam was performed according to the departmental dose-optimization program which includes automated exposure control, adjustment of the mA and/or kV according to patient size and/or use of iterative reconstruction technique. COMPARISON:  CT head 02/25/15 FINDINGS: Brain: No hemorrhage. No hydrocephalus. No extra-axial fluid collection. No mass effect. No mass lesion. Chronic infarcts in the left frontal lobe. Mineralization of the basal ganglia bilaterally. Possible acute infarcts in the medial aspect of the left cerebellum (series 5, image 20). Vascular: No hyperdense vessel or unexpected calcification. Skull: Normal. Negative for fracture or focal lesion. Sinuses/Orbits: No middle ear or mastoid effusion. Paranasal sinuses are clear. Orbits  are unremarkable. Other: None IMPRESSION: 1. Possible acute infarct in the medial aspect  of the left cerebellum. Recommend brain MRI for further evaluation. 2. Chronic infarcts in the left frontal lobe. Electronically Signed   By: Lorenza Cambridge M.D.   On: 12/11/2022 15:48    Pending Labs Unresulted Labs (From admission, onward)     Start     Ordered   12/12/22 0500  HIV Antibody (routine testing w rflx)  (HIV Antibody (Routine testing w reflex) panel)  Tomorrow morning,   R        12/11/22 2134   12/12/22 0500  Lipid panel  (Labs)  Tomorrow morning,   R       Comments: Fasting    12/11/22 2134   12/12/22 0500  Hemoglobin A1c  (Labs)  Tomorrow morning,   R       Comments: To assess prior glycemic control    12/11/22 2134   12/12/22 0500  CBC  (Labs)  Tomorrow morning,   R        12/11/22 2134   12/12/22 0500  Basic metabolic panel  Tomorrow morning,   R        12/11/22 2134            Vitals/Pain Today's Vitals   12/11/22 1200 12/11/22 1600 12/11/22 2005 12/11/22 2143  BP: 136/73 136/74 (!) 151/71   Pulse: 85 78 82 80  Resp: 20 16 18    Temp:  98 F (36.7 C) 97.9 F (36.6 C)   TempSrc:  Oral Oral   SpO2: 99% 98% 100% 96%  Weight:      Height:      PainSc:   0-No pain     Isolation Precautions No active isolations  Medications Medications  LORazepam (ATIVAN) injection 1 mg (1 mg Intravenous Given 12/11/22 1703)   stroke: early stages of recovery book (has no administration in time range)  acetaminophen (TYLENOL) tablet 650 mg (has no administration in time range)    Or  acetaminophen (TYLENOL) 160 MG/5ML solution 650 mg (has no administration in time range)    Or  acetaminophen (TYLENOL) suppository 650 mg (has no administration in time range)  senna-docusate (Senokot-S) tablet 1 tablet (has no administration in time range)  enoxaparin (LOVENOX) injection 40 mg (has no administration in time range)  insulin glargine-yfgn (SEMGLEE) injection 20 Units (has no  administration in time range)  insulin aspart (novoLOG) injection 0-15 Units (has no administration in time range)  insulin aspart (novoLOG) injection 0-5 Units (has no administration in time range)  living well with diabetes book MISC ( Does not apply Given 12/11/22 1656)  gadobutrol (GADAVIST) 1 MMOL/ML injection 10 mL (10 mLs Intravenous Contrast Given 12/11/22 1739)    Mobility walks     Focused Assessments Cardiac Assessment Handoff:  Cardiac Rhythm: Normal sinus rhythm Lab Results  Component Value Date   CKTOTAL 207 (H) 05/19/2011   CKMB 1.3 05/19/2011   TROPONINI 0.05 (HH) 06/01/2017   Lab Results  Component Value Date   DDIMER 3.15 (H) 07/19/2017   Does the Patient currently have chest pain? No   , Pulmonary Assessment Handoff:  Lung sounds:   O2 Device: Room Air      R Recommendations: See Admitting Provider Note  Report given to:   Additional Notes:

## 2022-12-11 NOTE — ED Notes (Signed)
Patient transported to MRI 

## 2022-12-11 NOTE — ED Provider Notes (Signed)
Assumed from Michelle Kanner, PA-C at shift change pending CT head and MRI brain.  See his note for full HPI.  In short, patient is a 57 year old female with history of previous CVA who presents to the ED due to dizziness and drifting to the right that started around 6 AM.  She also admitted to a left-sided headache.  Symptoms resolved after 1 to 2 hours.  Plan from previous provider: follow-up CT head. Physical Exam  BP 136/73   Pulse 85   Temp (!) 97.5 F (36.4 C)   Resp 20   Ht 5\' 9"  (1.753 m)   Wt 96.6 kg   LMP  (LMP Unknown) Comment: over a year ago  SpO2 99%   BMI 31.45 kg/m   Physical Exam Vitals and nursing note reviewed.  Constitutional:      General: She is not in acute distress.    Appearance: She is not ill-appearing.  HENT:     Head: Normocephalic.  Eyes:     Pupils: Pupils are equal, round, and reactive to light.  Cardiovascular:     Rate and Rhythm: Normal rate and regular rhythm.     Pulses: Normal pulses.     Heart sounds: Normal heart sounds. No murmur heard.    No friction rub. No gallop.  Pulmonary:     Effort: Pulmonary effort is normal.     Breath sounds: Normal breath sounds.  Abdominal:     General: Abdomen is flat. There is no distension.     Palpations: Abdomen is soft.     Tenderness: There is no abdominal tenderness. There is no guarding or rebound.  Musculoskeletal:        General: Normal range of motion.     Cervical back: Neck supple.  Skin:    General: Skin is warm and dry.  Neurological:     General: No focal deficit present.     Mental Status: She is alert.     Comments: Speech is clear, able to follow commands CN III-XII intact Normal strength in upper and lower extremities bilaterally including dorsiflexion and plantar flexion, strong and equal grip strength Sensation grossly intact throughout Moves extremities without ataxia, coordination intact No pronator drift  Psychiatric:        Mood and Affect: Mood normal.         Behavior: Behavior normal.     Procedures  .Critical Care  Performed by: Mannie Stabile, PA-C Authorized by: Mannie Stabile, PA-C   Critical care provider statement:    Critical care time (minutes):  31   Critical care was necessary to treat or prevent imminent or life-threatening deterioration of the following conditions:  CNS failure or compromise   Critical care was time spent personally by me on the following activities:  Development of treatment plan with patient or surrogate, discussions with consultants, evaluation of patient's response to treatment, examination of patient, ordering and review of laboratory studies, ordering and review of radiographic studies, ordering and performing treatments and interventions, pulse oximetry, re-evaluation of patient's condition and review of old charts   I assumed direction of critical care for this patient from another provider in my specialty: no     Care discussed with: admitting provider     ED Course / MDM   Clinical Course as of 12/11/22 2009  Tue Dec 11, 2022  1556 Discussed with radiology. Concern for acute CVA.  [CA]  1946 Discussed with radiology. MRI positive for acute CVA.  [CA]  Clinical Course User Index [CA] Mannie Stabile, PA-C   Medical Decision Making Amount and/or Complexity of Data Reviewed Labs: ordered. Decision-making details documented in ED Course. Radiology: ordered and independent interpretation performed. Decision-making details documented in ED Course.  Risk Prescription drug management. Decision regarding hospitalization.   4:04 PM reassessed patient at bedside.  Patient notes symptoms started around 6 AM this morning lasted roughly 1 to 2 hours.  She notes she woke up at 4 AM feeling her normal self.  Patient states around 6 AM started having dizziness and drifting to the right when walking.  Also admitted to a headache at that time.  No visual or speech changes.  Patient notes symptoms  resolved within 1 to 2 hours.  Denies any current symptoms.  Patient has a history of previous CVA with some left lower extremity weakness.  CT head personally reviewed and interpreted which demonstrates acute CVA. Agree with radiology. Discussed results with radiology as well. MRI ordered by previous provider.  Will discuss with neurology.   MRI personally reviewed inter which demonstrates an acute infarct in the left cerebellum.  Patient require admission for CVA workup.  Discussed with Dr. Otelia Limes with neurology who will follow.  8:11 PM Discussed with Dr. Allena Katz with [facility] who agrees to admit patient.   Co morbidities that complicate the patient evaluation  HTN Cardiac Monitoring: / EKG:  The patient was maintained on a cardiac monitor.  I personally viewed and interpreted the cardiac monitored which showed an underlying rhythm of: NSR  Social Determinants of Health:  Hx homelessness        [name] 12/11/22 2017    [name] T, DO 12/12/22 0001

## 2022-12-11 NOTE — Plan of Care (Signed)

## 2022-12-11 NOTE — Inpatient Diabetes Management (Signed)
Inpatient Diabetes Program Recommendations  AACE/ADA: New Consensus Statement on Inpatient Glycemic Control (2015)  Target Ranges:  Prepandial:   less than 140 mg/dL      Peak postprandial:   less than 180 mg/dL (1-2 hours)      Critically ill patients:  140 - 180 mg/dL   Lab Results  Component Value Date   GLUCAP 281 (H) 12/11/2022   HGBA1C 9.6 (A) 10/04/2022    Review of Glycemic Control  Latest Reference Range & Units 12/11/22 11:27  Glucose-Capillary 70 - 99 mg/dL 478 (H)  (H): Data is abnormally high  Diabetes history: DM2 Outpatient Diabetes medications:  Trulicity 1.5 mg weekly Lantus 40 units every day Metformin 500 mg every day Current orders for Inpatient glycemic control: None  Spoke with patient at bedside.  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  Her glucose was 338 at that time and was told to come to the ED by her occupational health nurse at work.  She checks her glucose occasionally.  She has been out of her Trulicity for 2 weeks.  Was planning on picking it up from the pharmacy today.  She states her pharmacy has been out of Trulicity.  She has noticed her glucose trending up over the last couple of weeks.  She does not wear a Dexcom and is not interested in a CGM.  Her last A1C was 9.6% in August.  Educated on The Plate Method, CHO's, portion control, CBGs at home fasting and mid afternoon, F/U with PCP every 3 months, bring meter to PCP office, long and short term complications of uncontrolled BG, and importance of exercise.  She has suffered a stroke in the past.  Asked her to speak with her PCP about changing Trulicity to Ozempic as Ozempic is more protective against stroke, CV events and CV death.  She states she has never really had good education regarding DM2.  Ordered her the Living Well with DM booklet.  Asked RN to give it to her if she is DC'd.    Will continue to follow while inpatient.  Thank you, Dulce Sellar, MSN, CDCES Diabetes Coordinator Inpatient Diabetes Program [phone] (team pager from 8a-5p)

## 2022-12-11 NOTE — Consult Note (Signed)
NEURO HOSPITALIST CONSULT NOTE   Requesting physician: Dr. Allena Katz  Reason for Consult: Early subacute left medial cerebellar stroke  History obtained from:  Patient and Chart     HPI:                                                                                                                                          [name] is an 57 y.o. female with a PMHx of DM2, HTN, nonischemic cardiomyopathy, stroke in 2017 with right sided weakness and systolic CHF who presented on Tuesday with acute onset of dizziness (non-vertiginous, described as feeling off balance) and staggering gait. MRI brain revealed a small medial left cerebellar acute ischemic infarction.   Home medications include ASA and rosuvastatin.   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, now resolved   Systolic CHF (HCC)    Echo 05/19/11 EF 35% w/ mild-mod MR    Past Surgical History:  Procedure Laterality Date   BREAST BIOPSY Left 05/24/2022   MM LT BREAST BX W LOC DEV 1ST LESION IMAGE BX SPEC STEREO GUIDE 05/24/2022 [facility] MAMMOGRAPHY   CERVIX LESION DESTRUCTION     DILATION AND CURETTAGE OF UTERUS     LEFT HEART CATHETERIZATION WITH CORONARY ANGIOGRAM N/A 05/21/2011   Procedure: LEFT HEART CATHETERIZATION WITH CORONARY ANGIOGRAM;  Surgeon: Tonny Bollman, MD;  Location: [facility] CATH LAB;  Service: Cardiovascular;  Laterality:  N/A;   TUBAL LIGATION  1996    Family History  Problem Relation Age of Onset   Diabetes Other        multiple   Heart failure Paternal Grandmother    Breast cancer Paternal Grandmother    Heart disease Other        multiple   Breast cancer Mother              Social History:  reports that she has never smoked. She has never used smokeless tobacco. She reports that she does not drink alcohol and does not use drugs.  Allergies  Allergen Reactions   Lisinopril Hives and Swelling    Facial    Sertraline Hives and Swelling    Facial  Tramadol Hives and Swelling    facial   Ibuprofen Hives and Swelling    MEDICATIONS:                                                                                                                     Prior to Admission:  Medications Prior to Admission  Medication Sig Dispense Refill Last Dose   Accu-Chek Softclix Lancets lancets Use to check blood sugar up to 3 times daily. E11.42 (Patient not taking: Reported on 11/06/2022) 100 each 2    albuterol (PROVENTIL) (2.5 MG/3ML) 0.083% nebulizer solution USE 3 MILLILITERS IN NEBULIZER EVERY 6 HOURS AS NEEDED FOR WHEEZING FOR SHORTNESS OF BREATH (Patient taking differently: Take 2.5 mg by nebulization every 4 (four) hours as needed for wheezing or shortness of breath.) 75 mL 0    albuterol (VENTOLIN HFA) 108 (90 Base) MCG/ACT inhaler Inhale 2 puffs into the lungs every 6 (six) hours as needed for wheezing or shortness of breath. 18 g 6    aspirin 81 MG chewable tablet Chew 1 tablet (81 mg total) by mouth daily. (Patient not taking: Reported on 11/06/2022) 30 tablet 3    blood glucose meter kit and supplies Dispense based on patient and insurance preference. Use up to four times daily as directed. (FOR ICD-9 250.00, 250.01). (Patient not taking: Reported on 11/06/2022) 1 each 0    Blood Glucose Monitoring Suppl (ACCU-CHEK GUIDE ME) w/Device KIT 1 kit by Does not apply route 3 (three) times daily. Use to check  blood sugar up to 3 times daily. (Patient not taking: Reported on 11/06/2022) 1 kit 0    carvedilol (COREG) 12.5 MG tablet Take 12.5 mg by mouth 2 (two) times daily with a meal. (Patient not taking: Reported on 11/06/2022)      carvedilol (COREG) 25 MG tablet Take 25 mg by mouth 2 (two) times daily.      Cholecalciferol (VITAMIN D3) 10 MCG (400 UNIT) tablet Take 1 tablet (400 Units total) by mouth daily. (Patient not taking: Reported on 10/04/2022) 100 tablet 1    Continuous Blood Gluc Transmit (DEXCOM G6 TRANSMITTER) MISC 1 packet by Does not apply route daily. (Patient not taking: Reported on 10/04/2022) 1 each 4    Dulaglutide (TRULICITY) 1.5 MG/0.5ML SOPN Inject 1.5 mg into the skin once a week. 2 mL 6    DULoxetine (CYMBALTA) 20 MG capsule Take 1 capsule (20 mg total) by mouth daily. (Patient not taking: Reported on 11/06/2022) 30 capsule 5    ezetimibe (ZETIA) 10 MG tablet Take 1 tablet (10 mg total) by mouth daily. 90 tablet 3    furosemide (LASIX) 40 MG tablet TAKE 1 TABLET BY MOUTH EVERY DAY 90 tablet 0    gabapentin (NEURONTIN) 100 MG capsule Take 2 capsules (200 mg total) by mouth at bedtime. For diabetic neuropathy symptoms (Patient not taking: Reported on 10/04/2022) 180 capsule 2    glucose blood (ACCU-CHEK GUIDE) test strip Use as instructed to check blood sugar up to 3  times daily. (Patient not taking: Reported on 11/06/2022) 100 each 11    hydrALAZINE (APRESOLINE) 50 MG tablet Take 1 tablet (50 mg total) by mouth 3 (three) times daily. 270 tablet 3    isosorbide mononitrate (IMDUR) 30 MG 24 hr tablet Take 1 tablet (30 mg total) by mouth daily. 90 tablet 3    LANTUS SOLOSTAR 100 UNIT/ML Solostar Pen Inject 40 Units into the skin daily. 45 mL 6    metFORMIN (GLUCOPHAGE-XR) 500 MG 24 hr tablet Take 1 tablet (500 mg total) by mouth daily with breakfast. 90 tablet 1    rosuvastatin (CRESTOR) 40 MG tablet Take 1 tablet (40 mg total) by mouth daily. 90 tablet 3    spironolactone (ALDACTONE) 25 MG  tablet Take 1 tablet (25 mg total) by mouth daily. 90 tablet 3    TRUEPLUS PEN NEEDLES 31G X 6 MM MISC USE AS DIRECTED (Patient not taking: Reported on 11/06/2022) 100 each 1    Scheduled:   stroke: early stages of recovery book   Does not apply Once   aspirin  81 mg Oral Daily   clopidogrel  75 mg Oral Daily   enoxaparin (LOVENOX) injection  40 mg Subcutaneous Q24H   ezetimibe  10 mg Oral Daily   furosemide  40 mg Oral Daily   insulin aspart  0-15 Units Subcutaneous TID WC   insulin aspart  0-5 Units Subcutaneous QHS   insulin glargine-yfgn  20 Units Subcutaneous QHS   rosuvastatin  40 mg Oral Daily   Continuous:   ROS:                                                                                                                                       As per HPI. The patient states that her gait unsteadiness and dizziness are now resolved.    Blood pressure (!) 151/71, pulse 82, temperature 97.9 F (36.6 C), temperature source Oral, resp. rate 18, height 5\' 9"  (1.753 m), weight 96.6 kg, SpO2 100%.   General Examination:                                                                                                       Physical Exam  HEENT-  [place]/AT    Lungs- Respirations unlabored Extremities- No edema  Neurological Examination Mental Status: Alert, fully oriented, thought content appropriate.  Speech fluent without evidence of aphasia.  Able to follow all commands without difficulty. Cranial Nerves: II: Temporal visual fields intact with  no extinction to DSS. PERRL  III,IV, VI: No ptosis. EOMI.  V: Temp sensation equal bilaterally  VII: Smile symmetric VIII: Hearing intact to voice IX,X: No hoarseness XI: Symmetric shoulder shrug XII: Midline tongue extension Motor: BUE 5/5 proximally and distally BLE 5/5 proximally and distally  No pronator drift.  Sensory: Temp and light touch intact throughout, bilaterally. No extinction to DSS.  Deep Tendon Reflexes: 2+ and  symmetric throughout Cerebellar: No ataxia with FNF bilaterally  Gait: Normal gait and station    Lab Results: Basic Metabolic Panel: Recent Labs  Lab 12/11/22 1127 12/11/22 1141  NA 136 137  K 3.8 3.6  CL 99 98  CO2 28  --   GLUCOSE 286* 284*  BUN 16 17  CREATININE 0.99 1.10*  CALCIUM 9.1  --     CBC: Recent Labs  Lab 12/11/22 1127 12/11/22 1141  WBC 7.7  --   HGB 12.1 12.6  HCT 37.9 37.0  MCV 86.7  --   PLT 329  --     Cardiac Enzymes: No results for input(s): "CKTOTAL", "CKMB", "CKMBINDEX", "TROPONINI" in the last 168 hours.  Lipid Panel: No results for input(s): "CHOL", "TRIG", "HDL", "CHOLHDL", "VLDL", "LDLCALC" in the last 168 hours.  Imaging: MR Brain W and Wo Contrast  Result Date: 12/11/2022 CLINICAL DATA:  Dizzy, previous history of stroke, possible infarct on CT EXAM: MRI HEAD WITHOUT AND WITH CONTRAST TECHNIQUE: Multiplanar, multiecho pulse sequences of the brain and surrounding structures were obtained without and with intravenous contrast. CONTRAST:  10mL GADAVIST GADOBUTROL 1 MMOL/ML IV SOLN COMPARISON:  MRI head 02/24/2015, correlation is made with same day CT head FINDINGS: Brain: Restricted diffusion with ADC correlate in the inferomedial left cerebellum (series 5, image 62 and series 7, image 51), which is associated with increased T2 hyperintense signal and correlates with the hypodense focus noted on the same-day CT. Additional peripheral focus of restricted diffusion with ADC correlate lateral left cerebellum (series 5, image 65), which is also associated with increased T2 hyperintense signal but is not particularly hypodense on the same-day CT. Enhancing 7 mm focus in the left cerebellum (series 16, image 10). No other abnormal parenchymal or meningeal enhancement. No acute hemorrhage, mass, mass effect, or midline shift. No hydrocephalus or extra-axial collection. Pituitary and craniocervical junction within normal limits. Remote infarcts in the left  anterior frontal lobe and left frontoparietal region. Additional remote lacunar infarcts in the bilateral cerebellar hemispheres Vascular: Normal arterial flow voids. Skull and upper cervical spine: Normal marrow signal. Sinuses/Orbits: Clear paranasal sinuses. No acute finding in the orbits. Other: Fluid in the right mastoid air cells. IMPRESSION: 1. Acute infarct in the inferomedial left cerebellum, which correlates with the hypodense focus noted on the same-day CT. Additional smaller acute infarct in the lateral left cerebellum. 2. Enhancing focus in the left cerebellum is favored to represent a subacute infarct, although an enhancing neoplastic lesion could appear similar. Attention on follow-up. These results were called by telephone at the time of interpretation on 12/11/2022 at 7:47 pm to provider Claudette Stapler, PA, who verbally acknowledged these results. Electronically Signed   By: Wiliam Ke M.D.   On: 12/11/2022 19:49   CT Head Wo Contrast  Result Date: 12/11/2022 CLINICAL DATA:  dizzy, HA, h/o stroke EXAM: CT HEAD WITHOUT CONTRAST TECHNIQUE: Contiguous axial images were obtained from the base of the skull through the vertex without intravenous contrast. RADIATION DOSE REDUCTION: This exam was performed according to the departmental dose-optimization program which includes automated  exposure control, adjustment of the mA and/or kV according to patient size and/or use of iterative reconstruction technique. COMPARISON:  CT head 02/25/15 FINDINGS: Brain: No hemorrhage. No hydrocephalus. No extra-axial fluid collection. No mass effect. No mass lesion. Chronic infarcts in the left frontal lobe. Mineralization of the basal ganglia bilaterally. Possible acute infarcts in the medial aspect of the left cerebellum (series 5, image 20). Vascular: No hyperdense vessel or unexpected calcification. Skull: Normal. Negative for fracture or focal lesion. Sinuses/Orbits: No middle ear or mastoid effusion.  Paranasal sinuses are clear. Orbits are unremarkable. Other: None IMPRESSION: 1. Possible acute infarct in the medial aspect of the left cerebellum. Recommend brain MRI for further evaluation. 2. Chronic infarcts in the left frontal lobe. Electronically Signed   By: Lorenza Cambridge M.D.   On: 12/11/2022 15:48     Assessment: 57 year old female who presented on Tuesday with acute onset of dizziness (non-vertiginous, described as feeling off balance) and staggering gait. MRI brain revealed a small medial left cerebellar acute ischemic infarction.  - Exam is nonfocal - MRI brain w/wo contrast: Acute infarct in the inferomedial left cerebellum, which correlates with the hypodense focus noted on the same-day CT. Additional smaller acute infarct in the lateral left cerebellum. Enhancing focus in the left cerebellum is favored to represent a subacute infarct, although an enhancing neoplastic lesion could appear similar.  - CTA of head and neck: The patient's left cerebellar infarcts are associated with left vertebral dissection with diffusely and highly attenuated left vertebral artery that is new from a 2018 CTA. Atherosclerosis without flow reducing atheromatous stenosis. - Of note, she has not been taking ASA regularly  - Overall impression: Subacute left cerebellar ischemic strokes, most likely secondary to the acute left vertebral dissection seen on CTA.    Recommendations: 1. HgbA1c, fasting lipid panel 2. Cardiac telemetry 3. PT consult, OT consult, Speech consult 4. Echocardiogram 5. Continue rosuvastatin 6. Agree with adding Plavix to ASA.  Encourage full compliance. Continue DAPT for at least 6 months, at which time a repeat CTA should be obtained to evaluate for resolution of the left vertebral dissection. If CTA is negative at that time, then can consider stopping Plavix. ASA should be continued indefinitely.  7. Risk factor modification 8. BP management per standard protocol. Now out of the  permissive HTN time window.  9. Frequent neuro checks 10 NPO until passes stroke swallow screen      Electronically signed: Dr. Caryl Pina 12/11/2022, 8:29 PM

## 2022-12-11 NOTE — Progress Notes (Signed)
Virtual Visit Consent   [name], you are scheduled for a virtual visit with a [facility] provider today. Just as with appointments in the office, your consent must be obtained to participate. Your consent will be active for this visit and any virtual visit you may have with one of our providers in the next 365 days. If you have a MyChart account, a copy of this consent can be sent to you electronically.  As this is a virtual visit, video technology does not allow for your provider to perform a traditional examination. This may limit your provider's ability to fully assess your condition. If your provider identifies any concerns that need to be evaluated in person or the need to arrange testing (such as labs, EKG, etc.), we will make arrangements to do so. Although advances in technology are sophisticated, we cannot ensure that it will always work on either your end or our end. If the connection with a video visit is poor, the visit may have to be switched to a telephone visit. With either a video or telephone visit, we are not always able to ensure that we have a secure connection.  By engaging in this virtual visit, you consent to the provision of healthcare and authorize for your insurance to be billed (if applicable) for the services provided during this visit. Depending on your insurance coverage, you may receive a charge related to this service.  I need to obtain your verbal consent now. Are you willing to proceed with your visit today? [name] has provided verbal consent on 12/11/2022 for a virtual visit (video or telephone). [name], [place]  Date: 12/11/2022 9:46 AM  Virtual Visit via Video Note   I, [name], connected with  [name]  ([id number], Apr 10, 1965) on 12/11/22 at  9:30 AM EDT by a video-enabled telemedicine application and verified that I am speaking with the correct person using two identifiers.  Location: Patient: Virtual Visit  Location Patient: Home Provider: Virtual Visit Location Provider: Home Office   I discussed the limitations of evaluation and management by telemedicine and the availability of in person appointments. The patient expressed understanding and agreed to proceed.    History of Present Illness: [name] is a 57 y.o. who identifies as a female who was assigned female at birth, and is being seen today for multiple complaints.  Endorses she started feeling bad yesterday with headache. Glucose ranging 200 to low 300s yesterday. This morning at work was feeling bad again with headache and feeling off-balance. Denies true vertigo or syncope. As such went across the hall to the [facility] where she was triaged by RN (see note in chart). Glucose at 338 with BP borderline elevated. Patient states headache improved. Still feels off balance when moving around but fine at rest. Denies fever, chills, URI symptoms.  Last dose for Trulicity was missed (last Tuesday). Due again today but out of medication for past 2 weeks. Spoke with pharmacy but has not heard back. Continues to take her Lantus 40 units daily along with daily Metformin 500 mg.  Denies recent change to diet or activity level. Some increased job stressors as a potential contributor.   Patient also c/o left leg pain starting Friday of last week described as mainly in hip and back of her thigh. Worse with walking or sitting in prolonged periods. Now limping due to pain with walking. Pain intermittent when resting but constant with walking. Denies trauma or injury. Left leg more easily irritated  since she had her prior stroke.   HPI: HPI  Problems:  Patient Active Problem List   Diagnosis Date Noted   NPDR (nonproliferative diabetic retinopathy) (HCC) 10/04/2021   Acute cough 04/03/2021   Upper respiratory tract infection 04/03/2021   Anxiety and depression 01/17/2021   Sinus tachycardia 10/15/2018   Vitamin D deficiency 03/26/2018   Homeless  03/24/2018   Microalbuminuria 08/11/2017   Anemia 08/08/2017   Physical deconditioning    Hyperlipidemia 02/21/2017   Diabetic polyneuropathy associated with type 2 diabetes mellitus (HCC) 02/21/2017   Lung nodules 07/16/2016   Uncontrolled type 2 diabetes mellitus without complication, with long-term current use of insulin 07/16/2016   DCM (dilated cardiomyopathy) (HCC)    Essential hypertension 06/10/2016   Dyslipidemia associated with type 2 diabetes mellitus (HCC) 06/10/2016    Allergies:  Allergies  Allergen Reactions   Lisinopril Hives and Swelling    Facial    Sertraline Hives and Swelling    Facial    Tramadol Hives and Swelling    facial   Ibuprofen Hives and Swelling   Medications:  Current Outpatient Medications:    Accu-Chek Softclix Lancets lancets, Use to check blood sugar up to 3 times daily. E11.42 (Patient not taking: Reported on 11/06/2022), Disp: 100 each, Rfl: 2   albuterol (PROVENTIL) (2.5 MG/3ML) 0.083% nebulizer solution, USE 3 MILLILITERS IN NEBULIZER EVERY 6 HOURS AS NEEDED FOR WHEEZING FOR SHORTNESS OF BREATH (Patient not taking: Reported on 10/04/2022), Disp: 75 mL, Rfl: 0   albuterol (VENTOLIN HFA) 108 (90 Base) MCG/ACT inhaler, Inhale 2 puffs into the lungs every 6 (six) hours as needed for wheezing or shortness of breath., Disp: 18 g, Rfl: 6   aspirin 81 MG chewable tablet, Chew 1 tablet (81 mg total) by mouth daily. (Patient not taking: Reported on 11/06/2022), Disp: 30 tablet, Rfl: 3   blood glucose meter kit and supplies, Dispense based on patient and insurance preference. Use up to four times daily as directed. (FOR ICD-9 250.00, 250.01). (Patient not taking: Reported on 11/06/2022), Disp: 1 each, Rfl: 0   Blood Glucose Monitoring Suppl (ACCU-CHEK GUIDE ME) w/Device KIT, 1 kit by Does not apply route 3 (three) times daily. Use to check blood sugar up to 3 times daily. (Patient not taking: Reported on 11/06/2022), Disp: 1 kit, Rfl: 0   carvedilol (COREG)  12.5 MG tablet, Take 12.5 mg by mouth 2 (two) times daily with a meal. (Patient not taking: Reported on 11/06/2022), Disp: , Rfl:    carvedilol (COREG) 25 MG tablet, Take 25 mg by mouth 2 (two) times daily., Disp: , Rfl:    Cholecalciferol (VITAMIN D3) 10 MCG (400 UNIT) tablet, Take 1 tablet (400 Units total) by mouth daily. (Patient not taking: Reported on 10/04/2022), Disp: 100 tablet, Rfl: 1   Continuous Blood Gluc Transmit (DEXCOM G6 TRANSMITTER) MISC, 1 packet by Does not apply route daily. (Patient not taking: Reported on 10/04/2022), Disp: 1 each, Rfl: 4   Dulaglutide (TRULICITY) 1.5 MG/0.5ML SOPN, Inject 1.5 mg into the skin once a week., Disp: 2 mL, Rfl: 6   DULoxetine (CYMBALTA) 20 MG capsule, Take 1 capsule (20 mg total) by mouth daily. (Patient not taking: Reported on 11/06/2022), Disp: 30 capsule, Rfl: 5   ezetimibe (ZETIA) 10 MG tablet, Take 1 tablet (10 mg total) by mouth daily., Disp: 90 tablet, Rfl: 3   furosemide (LASIX) 40 MG tablet, TAKE 1 TABLET BY MOUTH EVERY DAY, Disp: 90 tablet, Rfl: 0   gabapentin (NEURONTIN) 100 MG  capsule, Take 2 capsules (200 mg total) by mouth at bedtime. For diabetic neuropathy symptoms (Patient not taking: Reported on 10/04/2022), Disp: 180 capsule, Rfl: 2   glucose blood (ACCU-CHEK GUIDE) test strip, Use as instructed to check blood sugar up to 3 times daily. (Patient not taking: Reported on 11/06/2022), Disp: 100 each, Rfl: 11   hydrALAZINE (APRESOLINE) 50 MG tablet, Take 1 tablet (50 mg total) by mouth 3 (three) times daily., Disp: 270 tablet, Rfl: 3   isosorbide mononitrate (IMDUR) 30 MG 24 hr tablet, Take 1 tablet (30 mg total) by mouth daily., Disp: 90 tablet, Rfl: 3   LANTUS SOLOSTAR 100 UNIT/ML Solostar Pen, Inject 40 Units into the skin daily., Disp: 45 mL, Rfl: 6   metFORMIN (GLUCOPHAGE-XR) 500 MG 24 hr tablet, Take 1 tablet (500 mg total) by mouth daily with breakfast., Disp: 90 tablet, Rfl: 1   rosuvastatin (CRESTOR) 40 MG tablet, Take 1 tablet (40  mg total) by mouth daily., Disp: 90 tablet, Rfl: 3   spironolactone (ALDACTONE) 25 MG tablet, Take 1 tablet (25 mg total) by mouth daily., Disp: 90 tablet, Rfl: 3   TRUEPLUS PEN NEEDLES 31G X 6 MM MISC, USE AS DIRECTED (Patient not taking: Reported on 11/06/2022), Disp: 100 each, Rfl: 1  Observations/Objective: Patient is well-developed, well-nourished in no acute distress.  Resting comfortably  at home.  Head is normocephalic, atraumatic.  No labored breathing. Speech is clear and coherent with logical content.  Patient is alert and oriented at baseline.   Assessment and Plan: 1. Uncontrolled type 2 diabetes mellitus with hyperglycemia (HCC)  2. Balance problem  3. Left leg pain  Giving glucose > 300, headache and feeling off-balance, discussed the need for an in person evaluation as she needs detailed exam, recheck of CBG/labs, short-acting insulin and/or fluids. Needs to discuss Trulicity Rx with PCP and close follow-up for ongoing management. Discussed leg can be assessed at same time as hard to evaluate via video, especially when she is sitting in a parked car.   Follow Up Instructions: I discussed the assessment and treatment plan with the patient. The patient was provided an opportunity to ask questions and all were answered. The patient agreed with the plan and demonstrated an understanding of the instructions.  A copy of instructions were sent to the patient via MyChart unless otherwise noted below.   The patient was advised to call back or seek an in-person evaluation if the symptoms worsen or if the condition fails to improve as anticipated.    Michelle Climes, PA-C

## 2022-12-11 NOTE — ED Triage Notes (Signed)
Pt states she was at work earlier today and felt dizzy and was staggering around. 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 Pt was told to come to the ED. Pt denies dizziness at this time. Pt denies pain, nausea, or vomiting at this time.

## 2022-12-11 NOTE — Patient Instructions (Signed)
[name], thank you for joining Piedad Climes, PA-C for today's virtual visit.  While this provider is not your primary care provider (PCP), if your PCP is located in our provider database this encounter information will be shared with them immediately following your visit.   A [facility] MyChart account gives you access to today's visit and all your visits, tests, and labs performed at [facility] " click here if you don't have a [facility] MyChart account or go to [email or web address]  Consent: (Patient) [name] provided verbal consent for this virtual visit at the beginning of the encounter.  Current Medications:  Current Outpatient Medications:    Accu-Chek Softclix Lancets lancets, Use to check blood sugar up to 3 times daily. E11.42 (Patient not taking: Reported on 11/06/2022), Disp: 100 each, Rfl: 2   albuterol (PROVENTIL) (2.5 MG/3ML) 0.083% nebulizer solution, USE 3 MILLILITERS IN NEBULIZER EVERY 6 HOURS AS NEEDED FOR WHEEZING FOR SHORTNESS OF BREATH (Patient not taking: Reported on 10/04/2022), Disp: 75 mL, Rfl: 0   albuterol (VENTOLIN HFA) 108 (90 Base) MCG/ACT inhaler, Inhale 2 puffs into the lungs every 6 (six) hours as needed for wheezing or shortness of breath., Disp: 18 g, Rfl: 6   aspirin 81 MG chewable tablet, Chew 1 tablet (81 mg total) by mouth daily. (Patient not taking: Reported on 11/06/2022), Disp: 30 tablet, Rfl: 3   blood glucose meter kit and supplies, Dispense based on patient and insurance preference. Use up to four times daily as directed. (FOR ICD-9 250.00, 250.01). (Patient not taking: Reported on 11/06/2022), Disp: 1 each, Rfl: 0   Blood Glucose Monitoring Suppl (ACCU-CHEK GUIDE ME) w/Device KIT, 1 kit by Does not apply route 3 (three) times daily. Use to check blood sugar up to 3 times daily. (Patient not taking: Reported on 11/06/2022), Disp: 1 kit, Rfl: 0   carvedilol (COREG) 12.5 MG tablet, Take 12.5 mg by mouth 2 (two) times  daily with a meal. (Patient not taking: Reported on 11/06/2022), Disp: , Rfl:    carvedilol (COREG) 25 MG tablet, Take 25 mg by mouth 2 (two) times daily., Disp: , Rfl:    Cholecalciferol (VITAMIN D3) 10 MCG (400 UNIT) tablet, Take 1 tablet (400 Units total) by mouth daily. (Patient not taking: Reported on 10/04/2022), Disp: 100 tablet, Rfl: 1   Continuous Blood Gluc Transmit (DEXCOM G6 TRANSMITTER) MISC, 1 packet by Does not apply route daily. (Patient not taking: Reported on 10/04/2022), Disp: 1 each, Rfl: 4   Dulaglutide (TRULICITY) 1.5 MG/0.5ML SOPN, Inject 1.5 mg into the skin once a week., Disp: 2 mL, Rfl: 6   DULoxetine (CYMBALTA) 20 MG capsule, Take 1 capsule (20 mg total) by mouth daily. (Patient not taking: Reported on 11/06/2022), Disp: 30 capsule, Rfl: 5   ezetimibe (ZETIA) 10 MG tablet, Take 1 tablet (10 mg total) by mouth daily., Disp: 90 tablet, Rfl: 3   furosemide (LASIX) 40 MG tablet, TAKE 1 TABLET BY MOUTH EVERY DAY, Disp: 90 tablet, Rfl: 0   gabapentin (NEURONTIN) 100 MG capsule, Take 2 capsules (200 mg total) by mouth at bedtime. For diabetic neuropathy symptoms (Patient not taking: Reported on 10/04/2022), Disp: 180 capsule, Rfl: 2   glucose blood (ACCU-CHEK GUIDE) test strip, Use as instructed to check blood sugar up to 3 times daily. (Patient not taking: Reported on 11/06/2022), Disp: 100 each, Rfl: 11   hydrALAZINE (APRESOLINE) 50 MG tablet, Take 1 tablet (50 mg total) by mouth 3 (three) times daily., Disp: 270 tablet, Rfl:  3   isosorbide mononitrate (IMDUR) 30 MG 24 hr tablet, Take 1 tablet (30 mg total) by mouth daily., Disp: 90 tablet, Rfl: 3   LANTUS SOLOSTAR 100 UNIT/ML Solostar Pen, Inject 40 Units into the skin daily., Disp: 45 mL, Rfl: 6   metFORMIN (GLUCOPHAGE-XR) 500 MG 24 hr tablet, Take 1 tablet (500 mg total) by mouth daily with breakfast., Disp: 90 tablet, Rfl: 1   rosuvastatin (CRESTOR) 40 MG tablet, Take 1 tablet (40 mg total) by mouth daily., Disp: 90 tablet, Rfl: 3    spironolactone (ALDACTONE) 25 MG tablet, Take 1 tablet (25 mg total) by mouth daily., Disp: 90 tablet, Rfl: 3   TRUEPLUS PEN NEEDLES 31G X 6 MM MISC, USE AS DIRECTED (Patient not taking: Reported on 11/06/2022), Disp: 100 each, Rfl: 1   Medications ordered in this encounter:  No orders of the defined types were placed in this encounter.    *If you need refills on other medications prior to your next appointment, please contact your pharmacy*  Follow-Up: Call back or seek an in-person evaluation if the symptoms worsen or if the condition fails to improve as anticipated.  [facility] [phone]  Other Instructions Please seek evaluation in person ASAP as discussed.  If you have been instructed to have an in-person evaluation today at a local Urgent Care facility, please use the link below. It will take you to a list of all of our available [facility], including address, phone number and hours of operation. Please do not delay care.  [facility]  If you or a family member do not have a primary care provider, use the link below to schedule a visit and establish care. When you choose a [facility] primary care physician or advanced practice provider, you gain a long-term partner in health. Find a Primary Care Provider  Learn more about [facility] in-office and virtual care options:  - Get Care Now

## 2022-12-11 NOTE — Hospital Course (Signed)
[name] is a 57 y.o. female with medical history significant for chronic HFrEF (EF 35-40%), history of CVA, T2DM, HTN, HLD, asthma who is admitted with acute inferomedial left cerebellar infarct.  Neurology to consult.

## 2022-12-11 NOTE — H&P (Signed)
History and Physical    [name] ZOX:[id number] DOB: Jun 22, 1965 DOA: 12/11/2022  PCP: Marcine Matar, MD  Patient coming from: Home  I have personally briefly reviewed patient's old medical records in [facility] Link  Chief Complaint: Dizziness, drifting to the right when walking  HPI: [name] is a 57 y.o. female with medical history significant for chronic HFrEF (EF 35-40%), history of CVA, T2DM, HTN, HLD, asthma who presented to the ED for evaluation of dizziness and rightward drift.  Patient states that yesterday morning she felt dizzy when walking and off balance, staggering towards her right side.  She says this resolved after short period of time.  This morning she had a similar episode where she felt dizzy, described as feeling off balance but not room spinning sensation.  Again when walking she would drift towards her right.  She also experienced left-sided headache.  She did not have any new weakness in her extremities but does report intermittent left lower leg pain that has been on and off for some time.  The symptoms resolved after a couple hours and she has returned to her baseline.  She says she actually drove herself to the ED on advice of her primary care team for evaluation.  Patient states that she has not been taking aspirin regularly despite prior history of stroke.  She reports feeling palpitations once in a while but none recently.  Denies chest pain, dyspnea, nausea, vomiting, abdominal pain.  ED Course  Labs/Imaging on admission: I have personally reviewed following labs and imaging studies.  Initial vitals showed BP 151/71, pulse 82, RR 18, temp 97.9 F, SpO2 100% on room air.  Labs show sodium 136, potassium 3.8, bicarb 28, BUN 16, creatinine 0.99, serum glucose 286, WBC 7.7, hemoglobin 12.1, platelets 329,000.  Serum hCG negative.  UA negative for UTI.  CT head without contrast showed possible acute infarct in the medial aspect of the left  cerebellum and chronic infarcts of the left frontal lobe.  MRI brain with and without contrast showed acute infarct in the inferomedial left cerebellum and additional smaller acute infarct in the lateral left cerebellum.  Enhancing focus in the left cerebellum is favored to represent a subacute infarct although an enhancing neoplastic lesion could appear similar.  Neurology were consulted and will evaluate.  Medical admission was recommended and the hospital service was consulted to admit.  Review of Systems: All systems reviewed and are negative except as documented in history of present illness above.   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, now resolved   Systolic CHF (HCC)    Echo 05/19/11 EF 35% w/ mild-mod MR    Past Surgical History:  Procedure Laterality Date   BREAST BIOPSY Left 05/24/2022   MM LT BREAST BX W LOC DEV 1ST LESION IMAGE BX SPEC STEREO GUIDE 05/24/2022 [facility] MAMMOGRAPHY   CERVIX LESION DESTRUCTION  DILATION AND CURETTAGE OF UTERUS     LEFT HEART CATHETERIZATION WITH CORONARY ANGIOGRAM N/A 05/21/2011   Procedure: LEFT HEART CATHETERIZATION WITH CORONARY ANGIOGRAM;  Surgeon: Tonny Bollman, MD;  Location: [facility] CATH LAB;  Service: Cardiovascular;  Laterality: N/A;   TUBAL LIGATION  1996    Social History:  reports that she has never smoked. She has never used smokeless tobacco. She reports  that she does not drink alcohol and does not use drugs.  Allergies  Allergen Reactions   Lisinopril Hives and Swelling    Facial    Sertraline Hives and Swelling    Facial    Tramadol Hives and Swelling    facial   Ibuprofen Hives and Swelling    Family History  Problem Relation Age of Onset   Diabetes Other        multiple   Heart failure Paternal Grandmother    Breast cancer Paternal Grandmother    Heart disease Other        multiple   Breast cancer Mother      Prior to Admission medications   Medication Sig Start Date End Date Taking? Authorizing Provider  Accu-Chek Softclix Lancets lancets Use to check blood sugar up to 3 times daily. E11.42 Patient not taking: Reported on 11/06/2022 12/15/19   Marcine Matar, MD  albuterol (PROVENTIL) (2.5 MG/3ML) 0.083% nebulizer solution USE 3 MILLILITERS IN NEBULIZER EVERY 6 HOURS AS NEEDED FOR WHEEZING FOR SHORTNESS OF BREATH Patient not taking: Reported on 10/04/2022 05/19/20   Marcine Matar, MD  albuterol (VENTOLIN HFA) 108 (90 Base) MCG/ACT inhaler Inhale 2 puffs into the lungs every 6 (six) hours as needed for wheezing or shortness of breath. 10/04/22   Marcine Matar, MD  aspirin 81 MG chewable tablet Chew 1 tablet (81 mg total) by mouth daily. Patient not taking: Reported on 11/06/2022 07/26/17   Shon Hale, MD  blood glucose meter kit and supplies Dispense based on patient and insurance preference. Use up to four times daily as directed. (FOR ICD-9 250.00, 250.01). Patient not taking: Reported on 11/06/2022 07/31/17   Storm Frisk, MD  Blood Glucose Monitoring Suppl (ACCU-CHEK GUIDE ME) w/Device KIT 1 kit by Does not apply route 3 (three) times daily. Use to check blood sugar up to 3 times daily. Patient not taking: Reported on 11/06/2022 11/16/19   Marcine Matar, MD  carvedilol (COREG) 12.5 MG tablet Take 12.5 mg by mouth 2 (two) times daily with a meal. Patient not taking: Reported on 11/06/2022 07/18/20    [provider]  carvedilol (COREG) 25 MG tablet Take 25 mg by mouth 2 (two) times daily. 10/21/22   [provider]  Cholecalciferol (VITAMIN D3) 10 MCG (400 UNIT) tablet Take 1 tablet (400 Units total) by mouth daily. Patient not taking: Reported on 10/04/2022 03/22/21   Marcine Matar, MD  Continuous Blood Gluc Transmit (DEXCOM G6 TRANSMITTER) MISC 1 packet by Does not apply route daily. Patient not taking: Reported on 10/04/2022 03/21/21   Marcine Matar, MD  Dulaglutide (TRULICITY) 1.5 MG/0.5ML SOPN Inject 1.5 mg into the skin once a week. 10/04/22   Marcine Matar, MD  DULoxetine (CYMBALTA) 20 MG capsule Take 1 capsule (20 mg total) by mouth daily. Patient not taking: Reported on 11/06/2022 11/28/18   Marcine Matar, MD  ezetimibe (ZETIA) 10 MG tablet Take 1 tablet (10 mg total) by mouth daily. 02/26/22 02/21/23  Marcine Matar, MD  furosemide (LASIX) 40 MG tablet TAKE  1 TABLET BY MOUTH EVERY DAY 10/23/22   Marcine Matar, MD  gabapentin (NEURONTIN) 100 MG capsule Take 2 capsules (200 mg total) by mouth at bedtime. For diabetic neuropathy symptoms Patient not taking: Reported on 10/04/2022 02/26/22   Marcine Matar, MD  glucose blood (ACCU-CHEK GUIDE) test strip Use as instructed to check blood sugar up to 3 times daily. Patient not taking: Reported on 11/06/2022 11/16/19   Marcine Matar, MD  hydrALAZINE (APRESOLINE) 50 MG tablet Take 1 tablet (50 mg total) by mouth 3 (three) times daily. 02/26/22   Marcine Matar, MD  isosorbide mononitrate (IMDUR) 30 MG 24 hr tablet Take 1 tablet (30 mg total) by mouth daily. 02/26/22   Marcine Matar, MD  LANTUS SOLOSTAR 100 UNIT/ML Solostar Pen Inject 40 Units into the skin daily. 10/25/22   Marcine Matar, MD  metFORMIN (GLUCOPHAGE-XR) 500 MG 24 hr tablet Take 1 tablet (500 mg total) by mouth daily with breakfast. 10/04/22   Marcine Matar, MD  rosuvastatin (CRESTOR) 40 MG tablet Take 1 tablet (40 mg total)  by mouth daily. 04/26/22   Lewayne Bunting, MD  spironolactone (ALDACTONE) 25 MG tablet Take 1 tablet (25 mg total) by mouth daily. 02/26/22   Marcine Matar, MD  TRUEPLUS PEN NEEDLES 31G X 6 MM MISC USE AS DIRECTED Patient not taking: Reported on 11/06/2022 12/11/17   Marcine Matar, MD    Physical Exam: Vitals:   12/11/22 1200 12/11/22 1600 12/11/22 2005 12/11/22 2143  BP: 136/73 136/74 (!) 151/71   Pulse: 85 78 82 80  Resp: 20 16 18    Temp:  98 F (36.7 C) 97.9 F (36.6 C)   TempSrc:  Oral Oral   SpO2: 99% 98% 100% 96%  Weight:      Height:       Constitutional: Resting in bed, NAD, calm, comfortable Eyes: EOMI, lids and conjunctivae normal ENMT: Mucous membranes are moist. Posterior pharynx clear of any exudate or lesions.Normal dentition.  Neck: normal, supple, no masses. Respiratory: clear to auscultation bilaterally, no wheezing, no crackles. Normal respiratory effort. No accessory muscle use.  Cardiovascular: Regular rate and rhythm, no murmurs / rubs / gallops. No extremity edema. 2+ pedal pulses. Abdomen: no tenderness, no masses palpated.  Musculoskeletal: no clubbing / cyanosis. No joint deformity upper and lower extremities. Good ROM, no contractures. Normal muscle tone.  Skin: no rashes, lesions, ulcers. No induration Neurologic: Sensation intact. Strength 5/5 in all 4.  Psychiatric: Normal judgment and insight. Alert and oriented x 3. Normal mood.   EKG: Personally reviewed. Sinus rhythm, rate 87, QTc 577, no acute ischemic changes.  QT is more prolonged when compared to previous.  Assessment/Plan Principal Problem:   New cerebellar infarct (HCC) Active Problems:   Hypertension associated with diabetes (HCC)   Dyslipidemia associated with type 2 diabetes mellitus (HCC)   Chronic HFrEF (heart failure with reduced ejection fraction) (HCC)   Insulin dependent type 2 diabetes mellitus (HCC)   [name] is a 57 y.o. female with medical history  significant for chronic HFrEF (EF 35-40%), history of CVA, T2DM, HTN, HLD, asthma who is admitted with acute inferomedial left cerebellar infarct.  Neurology to consult.  Assessment and Plan: Acute left cerebellar CVA: MRI brain shows acute infarct in the inferior medial left cerebellum, small acute infarct in the lateral left cerebellum, and suspected subacute infarct in the left cerebellum although enhancing neoplastic lesion could appear similar.  Patient states she is not  taking aspirin regularly. -Neurology to consult -CTA head/neck -Obtain echocardiogram -Antiplatelet regimen as per neurology -Keep on telemetry, continue neurochecks -PT/OT/SLP eval -Check A1c and lipid panel  Chronic HFrEF: EF 35-40% by TTE 07/2022.  Appears euvolemic on admission. -Plan to restart home Lasix and Coreg tomorrow -Hold spironolactone and Imdur for now  Hypertension: Continue Coreg and Lasix tomorrow, holding spironolactone and Imdur.  Type 2 diabetes: Holding home meds.  Placed on Semglee 20 units qhs and SSI.  Hyperlipidemia: Continue rosuvastatin and Zetia.  QT prolongation: Noted on EKG.  Monitor on telemetry, limit QT prolonging agents.   DVT prophylaxis: enoxaparin (LOVENOX) injection 40 mg Start: 12/11/22 2145 Code Status: Full code, confirmed with patient on admission Family Communication: Discussed with patient, she has discussed with family Disposition Plan: From home, dispo pending clinical progress Consults called: Neurology Severity of Illness: The appropriate patient status for this patient is OBSERVATION. Observation status is judged to be reasonable and necessary in order to provide the required intensity of service to ensure the patient's safety. The patient's presenting symptoms, physical exam findings, and initial radiographic and laboratory data in the context of their medical condition is felt to place them at decreased risk for further clinical deterioration. Furthermore,  it is anticipated that the patient will be medically stable for discharge from the hospital within 2 midnights of admission.   Darreld Mclean MD [facility]  If 7PM-7AM, please contact night-coverage [email or web address]  12/11/2022, 9:59 PM

## 2022-12-12 ENCOUNTER — Observation Stay (HOSPITAL_BASED_OUTPATIENT_CLINIC_OR_DEPARTMENT_OTHER): Payer: PRIVATE HEALTH INSURANCE

## 2022-12-12 ENCOUNTER — Observation Stay (HOSPITAL_COMMUNITY): Payer: PRIVATE HEALTH INSURANCE

## 2022-12-12 ENCOUNTER — Other Ambulatory Visit (HOSPITAL_COMMUNITY): Payer: Self-pay

## 2022-12-12 DIAGNOSIS — I6389 Other cerebral infarction: Secondary | ICD-10-CM | POA: Diagnosis not present

## 2022-12-12 DIAGNOSIS — I639 Cerebral infarction, unspecified: Secondary | ICD-10-CM | POA: Diagnosis not present

## 2022-12-12 LAB — ECHOCARDIOGRAM COMPLETE
Area-P 1/2: 4.17 cm2
Calc EF: 50.4 %
Height: 69 in
MV M vel: 5.66 m/s
MV Peak grad: 128.1 mm[Hg]
Radius: 0.3 cm
S' Lateral: 4.8 cm
Single Plane A2C EF: 43.9 %
Single Plane A4C EF: 55.6 %
Weight: 3408 [oz_av]

## 2022-12-12 LAB — CBC
HCT: 34.1 % — ABNORMAL LOW (ref 36.0–46.0)
Hemoglobin: 10.8 g/dL — ABNORMAL LOW (ref 12.0–15.0)
MCH: 27.3 pg (ref 26.0–34.0)
MCHC: 31.7 g/dL (ref 30.0–36.0)
MCV: 86.1 fL (ref 80.0–100.0)
Platelets: 222 10*3/uL (ref 150–400)
RBC: 3.96 MIL/uL (ref 3.87–5.11)
RDW: 14.3 % (ref 11.5–15.5)
WBC: 6.7 10*3/uL (ref 4.0–10.5)
nRBC: 0 % (ref 0.0–0.2)

## 2022-12-12 LAB — RAPID URINE DRUG SCREEN, HOSP PERFORMED
Amphetamines: NOT DETECTED
Barbiturates: NOT DETECTED
Benzodiazepines: NOT DETECTED
Cocaine: NOT DETECTED
Opiates: NOT DETECTED
Tetrahydrocannabinol: NOT DETECTED

## 2022-12-12 LAB — BASIC METABOLIC PANEL
Anion gap: 9 (ref 5–15)
BUN: 15 mg/dL (ref 6–20)
CO2: 26 mmol/L (ref 22–32)
Calcium: 8.8 mg/dL — ABNORMAL LOW (ref 8.9–10.3)
Chloride: 101 mmol/L (ref 98–111)
Creatinine, Ser: 0.94 mg/dL (ref 0.44–1.00)
GFR, Estimated: >60 mL/min (ref >60)
Glucose, Bld: 200 mg/dL — ABNORMAL HIGH (ref 70–99)
Potassium: 3.1 mmol/L — ABNORMAL LOW (ref 3.5–5.1)
Sodium: 136 mmol/L (ref 135–145)

## 2022-12-12 LAB — GLUCOSE, CAPILLARY
Glucose-Capillary: 229 mg/dL — ABNORMAL HIGH (ref 70–99)
Glucose-Capillary: 175 mg/dL — ABNORMAL HIGH (ref 70–99)
Glucose-Capillary: 149 mg/dL — ABNORMAL HIGH (ref 70–99)

## 2022-12-12 LAB — HIV ANTIBODY (ROUTINE TESTING W REFLEX): HIV Screen 4th Generation wRfx: NONREACTIVE

## 2022-12-12 LAB — LIPID PANEL
Cholesterol: 187 mg/dL (ref 0–200)
HDL: 35 mg/dL — ABNORMAL LOW (ref >40)
LDL Cholesterol: 128 mg/dL — ABNORMAL HIGH (ref 0–99)
Total CHOL/HDL Ratio: 5.3 {ratio}
Triglycerides: 119 mg/dL (ref <150)
VLDL: 24 mg/dL (ref 0–40)

## 2022-12-12 LAB — HEMOGLOBIN A1C
Hgb A1c MFr Bld: 8.3 % — ABNORMAL HIGH (ref 4.8–5.6)
Mean Plasma Glucose: 191.51 mg/dL

## 2022-12-12 MED ORDER — ASPIRIN 81 MG PO CHEW
81.0000 mg | CHEWABLE_TABLET | Freq: Every day | ORAL | Status: DC
  Administered 2022-12-12: 81 mg via ORAL
  Filled 2022-12-12: qty 1

## 2022-12-12 MED ORDER — METFORMIN HCL ER 500 MG PO TB24: 500.0000 mg | ORAL_TABLET | Freq: Every day | ORAL | Status: DC

## 2022-12-12 MED ORDER — ISOSORBIDE MONONITRATE ER 30 MG PO TB24: 30.0000 mg | ORAL_TABLET | Freq: Every day | ORAL | Status: DC

## 2022-12-12 MED ORDER — PANTOPRAZOLE SODIUM 40 MG PO TBEC
40.0000 mg | DELAYED_RELEASE_TABLET | Freq: Every day | ORAL | 0 refills | Status: DC
  Filled 2022-12-12: qty 30, 30d supply, fill #0

## 2022-12-12 MED ORDER — CLOPIDOGREL BISULFATE 75 MG PO TABS
75.0000 mg | ORAL_TABLET | Freq: Every day | ORAL | 2 refills | Status: DC
  Filled 2022-12-12: qty 30, 30d supply, fill #0

## 2022-12-12 MED ORDER — POTASSIUM CHLORIDE CRYS ER 20 MEQ PO TBCR
60.0000 meq | EXTENDED_RELEASE_TABLET | Freq: Once | ORAL | Status: AC
  Administered 2022-12-12: 60 meq via ORAL
  Filled 2022-12-12: qty 3

## 2022-12-12 MED ORDER — CLOPIDOGREL BISULFATE 75 MG PO TABS
75.0000 mg | ORAL_TABLET | Freq: Every day | ORAL | Status: DC
  Administered 2022-12-12: 75 mg via ORAL
  Filled 2022-12-12: qty 1

## 2022-12-12 MED ORDER — HYDRALAZINE HCL 50 MG PO TABS: 50.0000 mg | ORAL_TABLET | Freq: Three times a day (TID) | ORAL | Status: DC

## 2022-12-12 MED ORDER — STUDY - OCEANIC-STROKE - ASUNDEXIAN 50 MG OR PLACEBO TABLET (PI-SETHI)
1.0000 | ORAL_TABLET | Freq: Every day | ORAL | Status: DC
  Administered 2022-12-12: 50 mg via ORAL
  Filled 2022-12-12: qty 1

## 2022-12-12 MED ORDER — EZETIMIBE 10 MG PO TABS
10.0000 mg | ORAL_TABLET | Freq: Every day | ORAL | 2 refills | Status: DC
  Filled 2022-12-12: qty 30, 30d supply, fill #0

## 2022-12-12 MED ORDER — IOHEXOL 350 MG/ML SOLN
75.0000 mL | Freq: Once | INTRAVENOUS | Status: AC | PRN
  Administered 2022-12-12: 75 mL via INTRAVENOUS

## 2022-12-12 NOTE — Evaluation (Signed)
Physical Therapy Evaluation Patient Details Name: [name] MRN: [id number] DOB: 08-25-1965 Today's Date: 12/12/2022  History of Present Illness  57 y.o. female who presented to the ED for evaluation of dizziness and rightward drift. MRI brain with and without contrast showed acute infarct in the inferomedial left cerebellum and additional smaller acute infarct in the lateral left cerebellum. Past medical history significant for chronic HFrEF (EF 35-40%), history of CVA, T2DM, HTN, HLD, asthma, cataracts, DM II  Clinical Impression  Pt is presenting at baseline level of functioning. Pt has no noted deficits from recent or prior CVA. Good balance overall with 56/56 on BERG Balance test. Pt will be discharged from skilled physical therapy services at this time and no recommendations for skilled physical therapy services on discharge from acute care hospital setting. Please re-consult if further needs arise.               Equipment Recommendations None recommended by PT     Functional Status Assessment Patient has not had a recent decline in their functional status     Precautions / Restrictions Precautions Precautions: None Restrictions Weight Bearing Restrictions: No      Mobility  Bed Mobility Overal bed mobility: Independent      Transfers Overall transfer level: Independent Equipment used: None      Ambulation/Gait Ambulation/Gait assistance: Independent Gait Distance (Feet): 200 Feet Assistive device: None Gait Pattern/deviations: WFL(Within Functional Limits) Gait velocity: WNL Gait velocity interpretation: >4.37 ft/sec, indicative of normal walking speed   General Gait Details: no noted deficits  Stairs Stairs:  (pt has ramp)             Balance Overall balance assessment: Independent         Standardized Balance Assessment Standardized Balance Assessment : Berg Balance Test Berg Balance Test Sit to Stand: Able to stand without using hands  and stabilize independently Standing Unsupported: Able to stand safely 2 minutes Sitting with Back Unsupported but Feet Supported on Floor or Stool: Able to sit safely and securely 2 minutes Stand to Sit: Sits safely with minimal use of hands Transfers: Able to transfer safely, minor use of hands Standing Unsupported with Eyes Closed: Able to stand 10 seconds safely Standing Ubsupported with Feet Together: Able to place feet together independently and stand 1 minute safely From Standing, Reach Forward with Outstretched Arm: Can reach confidently >25 cm (10") From Standing Position, Pick up Object from Floor: Able to pick up shoe safely and easily From Standing Position, Turn to Look Behind Over each Shoulder: Looks behind from both sides and weight shifts well Turn 360 Degrees: Able to turn 360 degrees safely in 4 seconds or less Standing Unsupported, Alternately Place Feet on Step/Stool: Able to stand independently and safely and complete 8 steps in 20 seconds Standing Unsupported, One Foot in Front: Able to place foot tandem independently and hold 30 seconds Standing on One Leg: Able to lift leg independently and hold > 10 seconds Total Score: 56         Pertinent Vitals/Pain Pain Assessment Pain Assessment: No/denies pain    Home Living Family/patient expects to be discharged to:: Private residence Living Arrangements: Non-relatives/Friends Jonny Ruiz) Available Help at Discharge: Available 24 hours/day;Friend(s) Type of Home: House Home Access: Stairs to enter   [employer] of Steps: 1 small   Home Layout: One level Home Equipment: Shower seat - built Char[employer] fundraiser (2 wheels);Cane - single point;Crutches;Grab bars - tub/shower;Grab bars - toilet Additional Comments: Order processor at [employer]  Prior Function Prior Level of Function : Independent/Modified Independent;Driving;Working/employed             Mobility Comments: ind no AD ADLs Comments: ind      Extremity/Trunk Assessment   Upper Extremity Assessment Upper Extremity Assessment: Defer to OT evaluation    Lower Extremity Assessment Lower Extremity Assessment: Overall WFL for tasks assessed    Cervical / Trunk Assessment Cervical / Trunk Assessment: Normal  Communication   Communication Communication: No apparent difficulties  Cognition Arousal: Alert Behavior During Therapy: WFL for tasks assessed/performed Overall Cognitive Status: Within Functional Limits for tasks assessed        General Comments General comments (skin integrity, edema, etc.): Pt was able to perform SLS, tandem standing, rhomberg standing with EO/EC without difficulty. Pt performed toe tapping, heel-shin slide, finger opposition and RAMPS with decreased speed but good accuracy equal bil        Assessment/Plan    PT Assessment Patient does not need any further PT services         PT Goals (Current goals can be found in the Care Plan section)  Acute Rehab PT Goals PT Goal Formulation: All assessment and education complete, DC therapy            AM-PAC PT "6 Clicks" Mobility  Outcome Measure Help needed turning from your back to your side while in a flat bed without using bedrails?: None Help needed moving from lying on your back to sitting on the side of a flat bed without using bedrails?: None Help needed moving to and from a bed to a chair (including a wheelchair)?: None Help needed standing up from a chair using your arms (e.g., wheelchair or bedside chair)?: None Help needed to walk in hospital room?: None Help needed climbing 3-5 steps with a railing? : None 6 Click Score: 24    End of Session   Activity Tolerance: Patient tolerated treatment well Patient left: in bed;with call bell/phone within reach Nurse Communication: Mobility status      Time: 1610-9604 PT Time Calculation (min) (ACUTE ONLY): 15 min   Charges:   PT Evaluation $PT Eval Low Complexity: 1 Low   PT  General Charges $$ ACUTE PT VISIT: 1 Visit         Harrel Carina, DPT, CLT  Acute Rehabilitation Services Office: [phone] (Secure chat preferred)   Claudia Desanctis 12/12/2022, 10:04 AM

## 2022-12-12 NOTE — Progress Notes (Signed)
STROKE TEAM PROGRESS NOTE   SUBJECTIVE (INTERVAL HISTORY) No family is at the bedside.  Overall her condition is completely resolved. Pt now back to baseline and neuro intact. Discussed with her about DAPT and also OCEANIC trial and she is interested.    OBJECTIVE Temp:  [97.8 F (36.6 C)-98 F (36.7 C)] 98 F (36.7 C) (10/30 0748) Pulse Rate:  [78-82] 81 (10/30 0316) Cardiac Rhythm: Normal sinus rhythm (10/30 0820) Resp:  [11-33] 22 (10/30 1005) BP: (136-166)/(63-81) 159/81 (10/30 0932) SpO2:  [95 %-100 %] 95 % (10/30 0316)  Recent Labs  Lab 12/11/22 1127 12/11/22 1613 12/11/22 2215 12/12/22 0803 12/12/22 1137  GLUCAP 281* 102* 162* 229* 175*   Recent Labs  Lab 12/11/22 1127 12/11/22 1141 12/12/22 0442  NA 136 137 136  K 3.8 3.6 3.1*  CL 99 98 101  CO2 28  --  26  GLUCOSE 286* 284* 200*  BUN 16 17 15   CREATININE 0.99 1.10* 0.94  CALCIUM 9.1  --  8.8*  MG 1.7  --   --    No results for input(s): "AST", "ALT", "ALKPHOS", "BILITOT", "PROT", "ALBUMIN" in the last 168 hours. Recent Labs  Lab 12/11/22 1127 12/11/22 1141 12/12/22 0442  WBC 7.7  --  6.7  HGB 12.1 12.6 10.8*  HCT 37.9 37.0 34.1*  MCV 86.7  --  86.1  PLT 329  --  222   No results for input(s): "CKTOTAL", "CKMB", "CKMBINDEX", "TROPONINI" in the last 168 hours. No results for input(s): "LABPROT", "INR" in the last 72 hours. Recent Labs    12/11/22 1125  COLORURINE YELLOW  LABSPEC 1.013  PHURINE 5.0  GLUCOSEU NEGATIVE  HGBUR NEGATIVE  BILIRUBINUR NEGATIVE  KETONESUR NEGATIVE  PROTEINUR NEGATIVE  NITRITE NEGATIVE  LEUKOCYTESUR NEGATIVE       Component Value Date/Time   CHOL 187 12/12/2022 0442   CHOL 123 10/04/2022 1636   TRIG 119 12/12/2022 0442   HDL 35 (L) 12/12/2022 0442   HDL 40 10/04/2022 1636   CHOLHDL 5.3 12/12/2022 0442   VLDL 24 12/12/2022 0442   LDLCALC 128 (H) 12/12/2022 0442   LDLCALC 65 10/04/2022 1636   Lab Results  Component Value Date   HGBA1C 8.3 (H) 12/12/2022       Component Value Date/Time   LABOPIA NONE DETECTED 12/12/2022 1125   COCAINSCRNUR NONE DETECTED 12/12/2022 1125   LABBENZ NONE DETECTED 12/12/2022 1125   AMPHETMU NONE DETECTED 12/12/2022 1125   THCU NONE DETECTED 12/12/2022 1125   LABBARB NONE DETECTED 12/12/2022 1125    No results for input(s): "ETH" in the last 168 hours.  I have personally reviewed the radiological images below and agree with the radiology interpretations.  ECHOCARDIOGRAM COMPLETE  Result Date: 12/12/2022    ECHOCARDIOGRAM REPORT   Patient Name:   [name] Date of Exam: 12/12/2022 Medical Rec #:  [id number]       Height:       69.0 in Accession #:    [id number]      Weight:       213.0 lb Date of Birth:  Jan 13, 1966        BSA:          2.122 m Patient Age:    57 years        BP:           166/79 mmHg Patient Gender: F               HR:  78 bpm. Exam Location:  Inpatient Procedure: 2D Echo, Cardiac Doppler and Color Doppler Indications:     Stroke  History:         Patient has prior history of Echocardiogram examinations, most                  recent 07/23/2022. CHF and Cardiomyopathy, Stroke, Mitral Valve                  Disease; Risk Factors:Hypertension, Diabetes and Dyslipidemia.  Sonographer:     Milda Smart Referring Phys:  Charlsie Quest Diagnosing Phys: Lennie Odor MD  Sonographer Comments: Image acquisition challenging due to patient body habitus. IMPRESSIONS  1. Left ventricular ejection fraction, by estimation, is 35 to 40%. The left ventricle has moderately decreased function. The left ventricle demonstrates global hypokinesis. The left ventricular internal cavity size was mildly dilated. Left ventricular diastolic function could not be evaluated.  2. Right ventricular systolic function is normal. The right ventricular size is mildly enlarged. Tricuspid regurgitation signal is inadequate for assessing PA pressure.  3. The mitral valve is grossly normal. Moderate to severe mitral valve  regurgitation. No evidence of mitral stenosis.  4. The aortic valve is tricuspid. Aortic valve regurgitation is not visualized. No aortic stenosis is present.  5. The inferior vena cava is dilated in size with >50% respiratory variability, suggesting right atrial pressure of 8 mmHg. Comparison(s): No significant change from prior study. FINDINGS  Left Ventricle: Left ventricular ejection fraction, by estimation, is 35 to 40%. The left ventricle has moderately decreased function. The left ventricle demonstrates global hypokinesis. The left ventricular internal cavity size was mildly dilated. There is no left ventricular hypertrophy. Left ventricular diastolic function could not be evaluated due to mitral regurgitation (moderate or greater). Left ventricular diastolic function could not be evaluated. Right Ventricle: The right ventricular size is mildly enlarged. No increase in right ventricular wall thickness. Right ventricular systolic function is normal. Tricuspid regurgitation signal is inadequate for assessing PA pressure. Left Atrium: Left atrial size was normal in size. Right Atrium: Right atrial size was normal in size. Pericardium: There is no evidence of pericardial effusion. Mitral Valve: The mitral valve is grossly normal. Moderate to severe mitral valve regurgitation. No evidence of mitral valve stenosis. Tricuspid Valve: The tricuspid valve is grossly normal. Tricuspid valve regurgitation is trivial. No evidence of tricuspid stenosis. Aortic Valve: The aortic valve is tricuspid. Aortic valve regurgitation is not visualized. No aortic stenosis is present. Pulmonic Valve: The pulmonic valve was grossly normal. Pulmonic valve regurgitation is not visualized. No evidence of pulmonic stenosis. Aorta: The aortic root and ascending aorta are structurally normal, with no evidence of dilitation. Venous: The inferior vena cava is dilated in size with greater than 50% respiratory variability, suggesting right  atrial pressure of 8 mmHg. IAS/Shunts: The atrial septum is grossly normal.  LEFT VENTRICLE PLAX 2D LVIDd:         5.80 cm      Diastology LVIDs:         4.80 cm      LV e' medial:    7.62 cm/s LV PW:         1.40 cm      LV E/e' medial:  11.6 LV IVS:        0.90 cm      LV e' lateral:   6.00 cm/s LVOT diam:     2.10 cm      LV E/e' lateral: 14.8 LV  SV:         71 LV SV Index:   34 LVOT Area:     3.46 cm  LV Volumes (MOD) LV vol d, MOD A2C: 101.0 ml LV vol d, MOD A4C: 119.0 ml LV vol s, MOD A2C: 56.7 ml LV vol s, MOD A4C: 52.8 ml LV SV MOD A2C:     44.3 ml LV SV MOD A4C:     119.0 ml LV SV MOD BP:      56.6 ml RIGHT VENTRICLE             IVC RV Basal diam:  2.70 cm     IVC diam: 2.10 cm RV S prime:     10.60 cm/s TAPSE (M-mode): 1.9 cm LEFT ATRIUM             Index        RIGHT ATRIUM           Index LA diam:        4.60 cm 2.17 cm/m   RA Area:     13.70 cm LA Vol (A2C):   54.8 ml 25.82 ml/m  RA Volume:   34.70 ml  16.35 ml/m LA Vol (A4C):   64.8 ml 30.54 ml/m LA Biplane Vol: 59.6 ml 28.09 ml/m  AORTIC VALVE LVOT Vmax:   92.00 cm/s LVOT Vmean:  73.500 cm/s LVOT VTI:    0.206 m  AORTA Ao Root diam: 3.20 cm Ao Asc diam:  3.50 cm MITRAL VALVE MV Area (PHT): 4.17 cm       SHUNTS MV Decel Time: 182 msec       Systemic VTI:  0.21 m MR Peak grad:    128.1 mmHg   Systemic Diam: 2.10 cm MR Mean grad:    85.0 mmHg MR Vmax:         566.00 cm/s MR Vmean:        430.0 cm/s MR PISA:         0.57 cm MR PISA Eff ROA: 4 mm MR PISA Radius:  0.30 cm MV E velocity: 88.70 cm/s MV A velocity: 45.00 cm/s MV E/A ratio:  1.97 Lennie Odor MD Electronically signed by Lennie Odor MD Signature Date/Time: 12/12/2022/2:06:19 PM    Final (Updated)    CT ANGIO HEAD NECK W WO CM  Result Date: 12/12/2022 CLINICAL DATA:  Stroke, determine embolic source EXAM: CT ANGIOGRAPHY HEAD AND NECK WITH AND WITHOUT CONTRAST TECHNIQUE: Multidetector CT imaging of the head and neck was performed using the standard protocol during bolus  administration of intravenous contrast. Multiplanar CT image reconstructions and MIPs were obtained to evaluate the vascular anatomy. Carotid stenosis measurements (when applicable) are obtained utilizing NASCET criteria, using the distal internal carotid diameter as the denominator. RADIATION DOSE REDUCTION: This exam was performed according to the departmental dose-optimization program which includes automated exposure control, adjustment of the mA and/or kV according to patient size and/or use of iterative reconstruction technique. CONTRAST:  75mL OMNIPAQUE IOHEXOL 350 MG/ML SOLN COMPARISON:  Brain MRI from yesterday.  CTA of the neck 06/11/2016 FINDINGS: CT HEAD FINDINGS Brain: Recent infarcts in the left cerebellum confirmed by MRI. Small chronic infarcts in the superior left frontal lobe in 2 separate locations. No acute hemorrhage, hydrocephalus, or mass. Vascular: No hyperdense vessel or unexpected calcification. Skull: Normal. Negative for fracture or focal lesion. Sinuses/Orbits: No acute finding. Review of the MIP images confirms the above findings CTA NECK FINDINGS Aortic arch: Atheromatous plaque with 3 vessel branching Right  carotid system: Atheromatous calcification scratch the atheromatous wall thickening with greatest and partially calcified plaque at the bifurcation. No stenosis or ulceration. Left carotid system: Low-density atheromatous wall thickening especially of the common carotid. No stenosis or ulceration. Vertebral arteries: Diffusely attenuated left vertebral artery with thready flow, a change since 2018. This is a presumed dissection. The patient sustained a left carotid dissection previously. No underlying vasculopathy is detected. Skeleton: No acute finding Other neck: No acute finding Upper chest: Clear apical lungs Review of the MIP images confirms the above findings CTA HEAD FINDINGS Anterior circulation: Atheromatous calcification of the cavernous carotids. No branch occlusion,  beading, or aneurysm. No proximal flow reducing stenosis. Posterior circulation: Attenuated left vertebral artery continues to the basilar, a change from prior. Fetal type right PCA flow. No branch occlusion, beading, or aneurysm. Venous sinuses: Unremarkable Anatomic variants: None significant Review of the MIP images confirms the above findings IMPRESSION: 1. The patient's left cerebellar infarcts are associated with left vertebral dissection with diffusely and highly attenuated left vertebral artery that is new from a 2018 CTA. 2. Atherosclerosis without flow reducing atheromatous stenosis. Electronically Signed   By: Tiburcio Pea M.D.   On: 12/12/2022 07:40   MR Brain W and Wo Contrast  Result Date: 12/11/2022 CLINICAL DATA:  Dizzy, previous history of stroke, possible infarct on CT EXAM: MRI HEAD WITHOUT AND WITH CONTRAST TECHNIQUE: Multiplanar, multiecho pulse sequences of the brain and surrounding structures were obtained without and with intravenous contrast. CONTRAST:  10mL GADAVIST GADOBUTROL 1 MMOL/ML IV SOLN COMPARISON:  MRI head 02/24/2015, correlation is made with same day CT head FINDINGS: Brain: Restricted diffusion with ADC correlate in the inferomedial left cerebellum (series 5, image 62 and series 7, image 51), which is associated with increased T2 hyperintense signal and correlates with the hypodense focus noted on the same-day CT. Additional peripheral focus of restricted diffusion with ADC correlate lateral left cerebellum (series 5, image 65), which is also associated with increased T2 hyperintense signal but is not particularly hypodense on the same-day CT. Enhancing 7 mm focus in the left cerebellum (series 16, image 10). No other abnormal parenchymal or meningeal enhancement. No acute hemorrhage, mass, mass effect, or midline shift. No hydrocephalus or extra-axial collection. Pituitary and craniocervical junction within normal limits. Remote infarcts in the left anterior frontal  lobe and left frontoparietal region. Additional remote lacunar infarcts in the bilateral cerebellar hemispheres Vascular: Normal arterial flow voids. Skull and upper cervical spine: Normal marrow signal. Sinuses/Orbits: Clear paranasal sinuses. No acute finding in the orbits. Other: Fluid in the right mastoid air cells. IMPRESSION: 1. Acute infarct in the inferomedial left cerebellum, which correlates with the hypodense focus noted on the same-day CT. Additional smaller acute infarct in the lateral left cerebellum. 2. Enhancing focus in the left cerebellum is favored to represent a subacute infarct, although an enhancing neoplastic lesion could appear similar. Attention on follow-up. These results were called by telephone at the time of interpretation on 12/11/2022 at 7:47 pm to provider Claudette Stapler, PA, who verbally acknowledged these results. Electronically Signed   By: Wiliam Ke M.D.   On: 12/11/2022 19:49   CT Head Wo Contrast  Result Date: 12/11/2022 CLINICAL DATA:  dizzy, HA, h/o stroke EXAM: CT HEAD WITHOUT CONTRAST TECHNIQUE: Contiguous axial images were obtained from the base of the skull through the vertex without intravenous contrast. RADIATION DOSE REDUCTION: This exam was performed according to the departmental dose-optimization program which includes automated exposure control, adjustment of the mA  and/or kV according to patient size and/or use of iterative reconstruction technique. COMPARISON:  CT head 02/25/15 FINDINGS: Brain: No hemorrhage. No hydrocephalus. No extra-axial fluid collection. No mass effect. No mass lesion. Chronic infarcts in the left frontal lobe. Mineralization of the basal ganglia bilaterally. Possible acute infarcts in the medial aspect of the left cerebellum (series 5, image 20). Vascular: No hyperdense vessel or unexpected calcification. Skull: Normal. Negative for fracture or focal lesion. Sinuses/Orbits: No middle ear or mastoid effusion. Paranasal sinuses are  clear. Orbits are unremarkable. Other: None IMPRESSION: 1. Possible acute infarct in the medial aspect of the left cerebellum. Recommend brain MRI for further evaluation. 2. Chronic infarcts in the left frontal lobe. Electronically Signed   By: Lorenza Cambridge M.D.   On: 12/11/2022 15:48     PHYSICAL EXAM  Temp:  [97.8 F (36.6 C)-98 F (36.7 C)] 98 F (36.7 C) (10/30 0748) Pulse Rate:  [78-82] 81 (10/30 0316) Resp:  [11-33] 22 (10/30 1005) BP: (136-166)/(63-81) 159/81 (10/30 0932) SpO2:  [95 %-100 %] 95 % (10/30 0316)  General - Well nourished, well developed, in no apparent distress.  Ophthalmologic - fundi not visualized due to noncooperation.  Cardiovascular - Regular rhythm and rate.  Mental Status -  Level of arousal and orientation to time, place, and person were intact. Language including expression, naming, repetition, comprehension was assessed and found intact. Fund of Knowledge was assessed and was intact.  Cranial Nerves II - XII - II - Visual field intact OU. III, IV, VI - Extraocular movements intact. V - Facial sensation intact bilaterally. VII - Facial movement intact bilaterally. VIII - Hearing & vestibular intact bilaterally. X - Palate elevates symmetrically. XI - Chin turning & shoulder shrug intact bilaterally. XII - Tongue protrusion intact.  Motor Strength - The patient's strength was normal in all extremities and pronator drift was absent.  Bulk was normal and fasciculations were absent.   Motor Tone - Muscle tone was assessed at the neck and appendages and was normal.  Reflexes - The patient's reflexes were symmetrical in all extremities and she had no pathological reflexes.  Sensory - Light touch, temperature/pinprick were assessed and were symmetrical.    Coordination - The patient had normal movements in the hands and feet with no ataxia or dysmetria.  Tremor was absent.  Gait and Station - deferred.   ASSESSMENT/PLAN [name] is a  57 y.o. female with history of cardiomyopathy, diabetes, hypertension, hyperlipidemia, asthma, obesity and stroke in 2017 admitted for dizziness, imbalance, right side leaning and left-sided headache. No tPA given due to also window.    Stroke:  left vermis and small PICA infarct, likely secondary to large vessel disease source with left VA occlusion CT left cerebellar infarct CTA head and neck left VA occlusion new from 2018, atherosclerosis at right ICA and left ICA siphon MRI small left vermis and left PICA infarct EF 35 to 40% LDL 128 HgbA1c 8.3 UDS negative Lovenox for VTE prophylaxis No antithrombotic (noncompliant with aspirin) prior to admission, now on aspirin 81 mg daily and clopidogrel 75 mg daily DAPT for 3 months and then as alone.  Also enrolled to [facility] trial Patient counseled to be compliant with her antithrombotic medications Ongoing aggressive stroke risk factor management Therapy recommendations: None Disposition: Home  History of stroke  02/2015 admitted for left MCA watershed infarcts.  CTA head and neck showed left CCA small mural thrombus, questionable dissection.  Carotid Doppler negative.  A1c 13.2, LDL 192.  Discharged on DAPT and Lipitor 80.  Diabetes HgbA1c 8.3 goal < 7.0 Uncontrolled Currently on insulin CBG monitoring SSI DM education and close PCP follow up  Hypertension Stable Gradually normalize BP in 2 to 3 days Long term BP goal normotensive  Hyperlipidemia Home meds: Crestor 40 LDL 128, goal < 70 Now on Crestor 40 and Zetia 10 Continue statin at discharge  Other Stroke Risk Factors Obesity, Body mass index is 31.45 kg/m.  Cardiomyopathy, EF 35 to 40%, follow with Dr. Jens Som at cardiology  Other Active Problems   Hospital day # 0  Neurology will sign off. Please call with questions. Pt will follow up with stroke clinic NP at [facility] in about 4 weeks. Thanks for the consult.   Marvel Plan, MD PhD Stroke Neurology 12/12/2022 3:30  PM    To contact Stroke Continuity provider, please refer to [email or web address]. After hours, contact General Neurology

## 2022-12-12 NOTE — Evaluation (Signed)
Speech Language Pathology Evaluation Patient Details Name: [name] MRN: [id number] DOB: [birth year] Today's Date: 12/12/2022 Time:  -     Problem List:  Patient Active Problem List   Diagnosis Date Noted   New cerebellar infarct (HCC) 12/11/2022   Chronic HFrEF (heart failure with reduced ejection fraction) (HCC) 12/11/2022   Insulin dependent type 2 diabetes mellitus (HCC) 12/11/2022   NPDR (nonproliferative diabetic retinopathy) (HCC) 10/04/2021   Acute cough 04/03/2021   Upper respiratory tract infection 04/03/2021   Anxiety and depression 01/17/2021   Sinus tachycardia 10/15/2018   Vitamin D deficiency 03/26/2018   Homeless 03/24/2018   Microalbuminuria 08/11/2017   Anemia 08/08/2017   Physical deconditioning    Hyperlipidemia 02/21/2017   Diabetic polyneuropathy associated with type 2 diabetes mellitus (HCC) 02/21/2017   Lung nodules 07/16/2016   Uncontrolled type 2 diabetes mellitus without complication, with long-term current use of insulin 07/16/2016   DCM (dilated cardiomyopathy) (HCC)    Hypertension associated with diabetes (HCC) 06/10/2016   Dyslipidemia associated with type 2 diabetes mellitus (HCC) 06/10/2016   Past Medical History:  Past Medical History:  Diagnosis Date   Asthma 05/31/2017   Cataracts, bilateral    Age-related   Diabetes mellitus, type 2 (HCC)    A1C 7.6 April '13   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, now resolved   Systolic CHF (HCC)    Echo 05/19/11 EF 35% w/ mild-mod MR   Past Surgical History:  Past  Surgical History:  Procedure Laterality Date   BREAST BIOPSY Left 05/24/2022   MM LT BREAST BX W LOC DEV 1ST LESION IMAGE BX SPEC STEREO GUIDE 05/24/2022 [facility]   CERVIX LESION DESTRUCTION     DILATION AND CURETTAGE OF UTERUS     LEFT HEART CATHETERIZATION WITH CORONARY ANGIOGRAM N/A 05/21/2011   Procedure: LEFT HEART CATHETERIZATION WITH CORONARY ANGIOGRAM;  Surgeon: Tonny Bollman, MD;  Location: [facility] CATH LAB;  Service: Cardiovascular;  Laterality: N/A;   TUBAL LIGATION  1996   HPI:  [name] is a 57 y.o. female who presented to the ED for evaluation of dizziness and rightward drift. MRI brain with and without contrast showed acute infarct in the inferomedial left cerebellum and additional smaller acute infarct in the lateral left cerebellum. Past medical history significant for chronic HFrEF (EF 35-40%), history of CVA, T2DM, HTN, HLD, asthma, cataracts, DM II   Assessment / Plan / Recommendation Clinical Impression  Pt presents with mild deficits in memory/short term recall as assessed by using the COGNISTAT (see below for additional information).  Pt performed within the average range on all subests administere except for memory which showed moderate impairment.  She benefited from multiple choice cues on 3 of 4 items and recalled one independently.  Pt did state that this was more difficult than expected, but that she does not have concerns regarding oingoing memory deficits or functional impairments. Visuospatial ability was assessed using clock drawing.  The only error was reversal in the length of the hands.  She feels she has returned to baesline and can resume her daily acitivies  and job functions without difficulty.  Pt's speech is clear and without dysarthria.  She did not exhibit any word finding difficulty.  Counseled pt to reach out to her provider if she notices any ongoing difficulties with memory for additional testing.  Pt has no further ST needs.  SLP will sign  off.  COGNISTAT: All subtests are within the average range, except where otherwise specified.  Orientation:  12/12 Attention: 7/8 Comprehension: 6/6 Repetition: 12/12 Naming: 8/8 Construction: clock drawing Memory: 5/12, moderate-severe impairment Calculations: 3/4 Similarities: 7/8 Judgment: 6/6     SLP Assessment  SLP Recommendation/Assessment: Patient does not need any further Speech Lanaguage Pathology Services SLP Visit Diagnosis: Cognitive communication deficit (R41.841)    Recommendations for follow up therapy are one component of a multi-disciplinary discharge planning process, led by the attending physician.  Recommendations may be updated based on patient status, additional functional criteria and insurance authorization.    Follow Up Recommendations  No SLP follow up    Assistance Recommended at Discharge     Functional Status Assessment Patient has not had a recent decline in their functional status  Frequency and Duration  (N/A)         SLP Evaluation Cognition  Overall Cognitive Status: Within Functional Limits for tasks assessed Orientation Level: Oriented X4 Year: 2024 Month: October Day of Week: Correct Attention: Focused;Sustained Focused Attention: Appears intact Sustained Attention: Appears intact Memory: Impaired Memory Impairment: Decreased short term memory Awareness: Appears intact Problem Solving: Appears intact Executive Function: Reasoning;Organizing Reasoning: Appears intact Organizing: Appears intact       Comprehension  Auditory Comprehension Overall Auditory Comprehension: Appears within functional limits for tasks assessed Commands: Within Functional Limits Conversation: Complex Visual Recognition/Discrimination Discrimination: Not tested Reading Comprehension Reading Status: Not tested    Expression Expression Primary Mode of Expression: Verbal Verbal Expression Overall Verbal Expression: Appears within functional limits  for tasks assessed Initiation: No impairment Level of Generative/Spontaneous Verbalization: Conversation Repetition: No impairment Naming: No impairment Pragmatics: No impairment Written Expression Dominant Hand: Right Written Expression: Not tested   Oral / Motor  Motor Speech Overall Motor Speech: Appears within functional limits for tasks assessed Respiration: Within functional limits Phonation: Normal Resonance: Within functional limits Articulation: Within functional limitis Intelligibility: Intelligible Motor Planning: Witnin functional limits Motor Speech Errors: Not applicable            Kerrie Pleasure, MA, CCC-SLP Acute Rehabilitation Services Office: [phone] 12/12/2022, 11:59 AM

## 2022-12-12 NOTE — Plan of Care (Signed)
No deficits noted patient ambulatory.

## 2022-12-12 NOTE — Research (Signed)
PATIENT: [name] DOB: 07-Oct-1965  [name] is a 57 y.o. female who meets inclusion and exclusion criteria and may be a potential candidate for OCEANIC study.  A brief description of the study's purpose and duration was provided to see if the patient was interested in participating. The patient was told that study participation is voluntary, and if she choose not to participate, she will continue receiving the same treatment as any other patient.   Since the patient seemed interested in the trial, a copy of the informed consent was provided to her at  12:33pm today and I said I would come back later to discuss the study in more detail after she had time to read the informed consent.  Detailed discussion  I returned at 2pm today to discuss the study in detail.  I requested her to ask any questions she may have as we reviewed the informed consent so I could answer them as we went along. I discussed the standard-of-care treatments, appropriate alternative treatments, procedures or devices that might be helpful to the patients, the clinical trial, and each option's relative risks and benefits and alternatives. We discussed the research participant's bill of rights. Then, we also reviewed the ICF page by page and discussed their responsibilities if they choose to participate, any compensation, and what medical treatment is available if injury occurs. [name] was encouraged to discuss study participation with family, significant others, and primary care attending or physician to help reduce the possibility of undue influence by talking to the investigator alone.  The patient was reminded that the study participation is voluntary, and if she choose not to participate, she will continue receiving the same care as any other patient. Additionally, if she choose to participate and later decide she want to withdraw, she will continue to be treated as any other patient. [name] was told that  she did not have to decide now.  [name] answered questions appropriately to verbalize understanding of the informed consent.   The subject agreed to participate in the [facility] trial and signed the Research Participants [name] of Rights and the informed consent at 3:17pm today.  The informed consent was obtained prior to performance of any protocol-specific procedures for the subject.  A copy of the Research Participants [name] of Rights and signed informed consent was given to the subject, and a copy placed in the subject's medical record.   Since she is on home crestor 40 and we added zeita 10 now, she will need close monitor of liver enzyme during the follow up phase. Discussed with CRAs. They are aware.  Marvel Plan, MD PhD Stroke Neurology 12/12/2022 5:16 PM

## 2022-12-12 NOTE — Discharge Instructions (Signed)
Follow with Primary MD Marcine Matar, MD in 7 days   Get CBC, CMP, checked  by Primary MD next visit.    Activity: As tolerated with Full fall precautions use walker/cane & assistance as needed   Disposition Home    Diet: Heart Healthy  , carb modified   On your next visit with your primary care physician please Get Medicines reviewed and adjusted.   Please request your Prim.MD to go over all Hospital Tests and Procedure/Radiological results at the follow up, please get all Hospital records sent to your Prim MD by signing hospital release before you go home.   If you experience worsening of your admission symptoms, develop shortness of breath, life threatening emergency, suicidal or homicidal thoughts you must seek medical attention immediately by calling 911 or calling your MD immediately  if symptoms less severe.  You Must read complete instructions/literature along with all the possible adverse reactions/side effects for all the Medicines you take and that have been prescribed to you. Take any new Medicines after you have completely understood and accpet all the possible adverse reactions/side effects.   Do not drive, operating heavy machinery, perform activities at heights, swimming or participation in water activities or provide baby sitting services if your were admitted for syncope or siezures until you have seen by Primary MD or a Neurologist and advised to do so again.  Do not drive when taking Pain medications.    Do not take more than prescribed Pain, Sleep and Anxiety Medications  Special Instructions: If you have smoked or chewed Tobacco  in the last 2 yrs please stop smoking, stop any regular Alcohol  and or any Recreational drug use.  Wear Seat belts while driving.   Please note  You were cared for by a hospitalist during your hospital stay. If you have any questions about your discharge medications or the care you received while you were in the hospital  after you are discharged, you can call the unit and asked to speak with the hospitalist on call if the hospitalist that took care of you is not available. Once you are discharged, your primary care physician will handle any further medical issues. Please note that NO REFILLS for any discharge medications will be authorized once you are discharged, as it is imperative that you return to your primary care physician (or establish a relationship with a primary care physician if you do not have one) for your aftercare needs so that they can reassess your need for medications and monitor your lab values.

## 2022-12-12 NOTE — Discharge Summary (Signed)
Physician Discharge Summary  [name] YWV:[id number] DOB: Jun 20, 1965 DOA: 12/11/2022  PCP: Marcine Matar, MD  Admit date: 12/11/2022 Discharge date: 12/12/2022  Admitted From: (Home) Disposition:  (Home)  Recommendations for Outpatient Follow-up:  Follow up with PCP in 1-2 weeks Please obtain BMP/CBC in one week Patient need to be on aspirin and Plavix x 90 days, then aspirin alone Ambulatory referral has been sent to neurology, patient is registered and research Study - OCEANIC-STROKE - asundexian 50 mg or placebo tablet (PI-Sethi)   Home Health: (NO)   Diet recommendation: Heart Healthy / Carb Modified   Brief/Interim Summary: [name] is a 57 y.o. female with medical history significant for chronic HFrEF (EF 35-40%), history of CVA, T2DM, HTN, HLD, asthma who presented to the ED for evaluation of dizziness and rightward drift.  CT head without contrast showed possible acute infarct in the medial aspect of the left cerebellum and chronic infarcts of the left frontal lobe.   MRI brain with and without contrast showed acute infarct in the inferomedial left cerebellum and additional smaller acute infarct in the lateral left cerebellum.  Patient was admitted to the hospital, and neurology were consulted for further management.  Please see discussion below   Acute left cerebellar CVA: MRI brain shows acute infarct in the inferior medial left cerebellum, small acute infarct in the lateral left cerebellum, and suspected subacute infarct in the left cerebellum, neurology consult greatly appreciated, CTA head and neck was significant for left vertebral artery LVO /dissection, with diffusely and highly attenuated, left vertebral artery that is new from previous CTA in 2018, and atherosclerosis without flow reducing atheromatosis stenosis, I have discussed with neurology, patient can be discharged on dual antiplatelet therapy aspirin and Plavix x 90 days then aspirin LAM, her  medication as well has been optimized when it comes to hyperlipidemia, please see discussion below.   -The echo EF 35 to 40%, which is at her baseline from previous echo in June of this year -Seen by PT-OT-SLP, no recommendation for outpatient PT -patient is registered and research Study - OCEANIC-STROKE - asundexian 50 mg or placebo tablet (PI-Sethi) -Will add Protonix for GI prophylaxis   Chronic HFrEF: EF 35-40% by TTE 07/2022.  Appears euvolemic on admission.  Repeat 2D echo this hospital stay showing stable EF at 35 to 40%, her medication has been resumed in a gradual fashion at time of discharge to allow for permissive hypertension, she is to resume hydralazine, Imdur in couple days, and to continue with Aldactone from tomorrow and to resume Coreg from today   -Plan to restart home Lasix and Coreg tomorrow -Hold spironolactone and Imdur for now   Hypertension: Please see above discussion   Type 2 diabetes: Uncontrolled, with A1c of 8.3  -Resume home medication, instructed to resume metformin in couple days as she received IV contrast during hospital workup     Hyperlipidemia: LDL is 128, continue rosuvastatin and Zetia.   QT prolongation: Initial QTc was 577, but repeat has improved to 474,   Hypokalemia  -potassium is low at 3.1, this was repleted before discharge  Discharge Diagnoses:  Principal Problem:   New cerebellar infarct [facility]) Active Problems:   Hypertension associated with diabetes (HCC)   Dyslipidemia associated with type 2 diabetes mellitus (HCC)   Chronic HFrEF (heart failure with reduced ejection fraction) (HCC)   Insulin dependent type 2 diabetes mellitus [facility])    Discharge Instructions  Discharge Instructions     Ambulatory referral to Neurology  Complete by: As directed    An appointment is requested in approximately: 4 weeks   Diet - low sodium heart healthy   Complete by: As directed    Discharge instructions   Complete by: As directed     Follow with Primary MD Marcine Matar, MD in 7 days   Get CBC, CMP, checked  by Primary MD next visit.    Activity: As tolerated with Full fall precautions use walker/cane & assistance as needed   Disposition Home    Diet: Heart Healthy  , carb modified   On your next visit with your primary care physician please Get Medicines reviewed and adjusted.   Please request your Prim.MD to go over all Hospital Tests and Procedure/Radiological results at the follow up, please get all Hospital records sent to your Prim MD by signing hospital release before you go home.   If you experience worsening of your admission symptoms, develop shortness of breath, life threatening emergency, suicidal or homicidal thoughts you must seek medical attention immediately by calling 911 or calling your MD immediately  if symptoms less severe.  You Must read complete instructions/literature along with all the possible adverse reactions/side effects for all the Medicines you take and that have been prescribed to you. Take any new Medicines after you have completely understood and accpet all the possible adverse reactions/side effects.   Do not drive, operating heavy machinery, perform activities at heights, swimming or participation in water activities or provide baby sitting services if your were admitted for syncope or siezures until you have seen by Primary MD or a Neurologist and advised to do so again.  Do not drive when taking Pain medications.    Do not take more than prescribed Pain, Sleep and Anxiety Medications  Special Instructions: If you have smoked or chewed Tobacco  in the last 2 yrs please stop smoking, stop any regular Alcohol  and or any Recreational drug use.  Wear Seat belts while driving.   Please note  You were cared for by a hospitalist during your hospital stay. If you have any questions about your discharge medications or the care you received while you were in the hospital  after you are discharged, you can call the unit and asked to speak with the hospitalist on call if the hospitalist that took care of you is not available. Once you are discharged, your primary care physician will handle any further medical issues. Please note that NO REFILLS for any discharge medications will be authorized once you are discharged, as it is imperative that you return to your primary care physician (or establish a relationship with a primary care physician if you do not have one) for your aftercare needs so that they can reassess your need for medications and monitor your lab values.   Increase activity slowly   Complete by: As directed       Allergies as of 12/12/2022       Reactions   Lisinopril Hives, Swelling   Facial    Sertraline Hives, Swelling   Facial    Tramadol Hives, Swelling   facial   Ibuprofen Hives, Swelling        Medication List     TAKE these medications    albuterol 108 (90 Base) MCG/ACT inhaler Commonly known as: VENTOLIN HFA Inhale 2 puffs into the lungs every 6 (six) hours as needed for wheezing or shortness of breath.   aspirin 81 MG chewable tablet Chew 1 tablet (81 mg  total) by mouth daily.   carvedilol 25 MG tablet Commonly known as: COREG Take 25 mg by mouth 2 (two) times daily.   clopidogrel 75 MG tablet Commonly known as: PLAVIX Take 1 tablet (75 mg total) by mouth daily. Start taking on: December 13, 2022   Dexcom G6 Transmitter Misc 1 packet by Does not apply route daily.   ezetimibe 10 MG tablet Commonly known as: ZETIA Take 1 tablet (10 mg total) by mouth daily.   furosemide 40 MG tablet Commonly known as: LASIX TAKE 1 TABLET BY MOUTH EVERY DAY   gabapentin 100 MG capsule Commonly known as: NEURONTIN Take 2 capsules (200 mg total) by mouth at bedtime. For diabetic neuropathy symptoms   hydrALAZINE 50 MG tablet Commonly known as: APRESOLINE Take 1 tablet (50 mg total) by mouth 3 (three) times daily. Start taking  on: December 13, 2022   isosorbide mononitrate 30 MG 24 hr tablet Commonly known as: IMDUR Take 1 tablet (30 mg total) by mouth daily. Start taking on: December 14, 2022 What changed: These instructions start on December 14, 2022. If you are unsure what to do until then, ask your doctor or other care provider.   Lantus SoloStar 100 UNIT/ML Solostar Pen Generic drug: insulin glargine Inject 40 Units into the skin daily.   metFORMIN 500 MG 24 hr tablet Commonly known as: GLUCOPHAGE-XR Take 1 tablet (500 mg total) by mouth daily with breakfast. Start taking on: December 14, 2022 What changed: These instructions start on December 14, 2022. If you are unsure what to do until then, ask your doctor or other care provider.   pantoprazole 40 MG tablet Commonly known as: Protonix Take 1 tablet (40 mg total) by mouth daily.   rosuvastatin 40 MG tablet Commonly known as: CRESTOR Take 1 tablet (40 mg total) by mouth daily.   spironolactone 25 MG tablet Commonly known as: ALDACTONE Take 1 tablet (25 mg total) by mouth daily.   Trulicity 1.5 MG/0.5ML Soaj Generic drug: Dulaglutide Inject 1.5 mg into the skin once a week. What changed: additional instructions        Allergies  Allergen Reactions   Lisinopril Hives and Swelling    Facial    Sertraline Hives and Swelling    Facial    Tramadol Hives and Swelling    facial   Ibuprofen Hives and Swelling    Consultations: Neurology   Procedures/Studies: ECHOCARDIOGRAM COMPLETE  Result Date: 12/12/2022    ECHOCARDIOGRAM REPORT   Patient Name:   Kimeko Hofmann Date of Exam: 12/12/2022 Medical Rec #:  [id number]       Height:       69.0 in Accession #:    [id number]      Weight:       213.0 lb Date of Birth:  08-10-1965        BSA:          2.122 m Patient Age:    57 years        BP:           166/79 mmHg Patient Gender: F               HR:           78 bpm. Exam Location:  Inpatient Procedure: 2D Echo, Cardiac Doppler and Color Doppler  Indications:     Stroke  History:         Patient has prior history of Echocardiogram examinations, most  recent 07/23/2022. CHF and Cardiomyopathy, Stroke, Mitral Valve                  Disease; Risk Factors:Hypertension, Diabetes and Dyslipidemia.  Sonographer:     Milda Smart Referring Phys:  Charlsie Quest Diagnosing Phys: Lennie Odor MD  Sonographer Comments: Image acquisition challenging due to patient body habitus. IMPRESSIONS  1. Left ventricular ejection fraction, by estimation, is 35 to 40%. The left ventricle has moderately decreased function. The left ventricle demonstrates global hypokinesis. The left ventricular internal cavity size was mildly dilated. Left ventricular diastolic function could not be evaluated.  2. Right ventricular systolic function is normal. The right ventricular size is mildly enlarged. Tricuspid regurgitation signal is inadequate for assessing PA pressure.  3. The mitral valve is grossly normal. Moderate to severe mitral valve regurgitation. No evidence of mitral stenosis.  4. The aortic valve is tricuspid. Aortic valve regurgitation is not visualized. No aortic stenosis is present.  5. The inferior vena cava is dilated in size with >50% respiratory variability, suggesting right atrial pressure of 8 mmHg. Comparison(s): No significant change from prior study. FINDINGS  Left Ventricle: Left ventricular ejection fraction, by estimation, is 35 to 40%. The left ventricle has moderately decreased function. The left ventricle demonstrates global hypokinesis. The left ventricular internal cavity size was mildly dilated. There is no left ventricular hypertrophy. Left ventricular diastolic function could not be evaluated due to mitral regurgitation (moderate or greater). Left ventricular diastolic function could not be evaluated. Right Ventricle: The right ventricular size is mildly enlarged. No increase in right ventricular wall thickness. Right ventricular systolic  function is normal. Tricuspid regurgitation signal is inadequate for assessing PA pressure. Left Atrium: Left atrial size was normal in size. Right Atrium: Right atrial size was normal in size. Pericardium: There is no evidence of pericardial effusion. Mitral Valve: The mitral valve is grossly normal. Moderate to severe mitral valve regurgitation. No evidence of mitral valve stenosis. Tricuspid Valve: The tricuspid valve is grossly normal. Tricuspid valve regurgitation is trivial. No evidence of tricuspid stenosis. Aortic Valve: The aortic valve is tricuspid. Aortic valve regurgitation is not visualized. No aortic stenosis is present. Pulmonic Valve: The pulmonic valve was grossly normal. Pulmonic valve regurgitation is not visualized. No evidence of pulmonic stenosis. Aorta: The aortic root and ascending aorta are structurally normal, with no evidence of dilitation. Venous: The inferior vena cava is dilated in size with greater than 50% respiratory variability, suggesting right atrial pressure of 8 mmHg. IAS/Shunts: The atrial septum is grossly normal.  LEFT VENTRICLE PLAX 2D LVIDd:         5.80 cm      Diastology LVIDs:         4.80 cm      LV e' medial:    7.62 cm/s LV PW:         1.40 cm      LV E/e' medial:  11.6 LV IVS:        0.90 cm      LV e' lateral:   6.00 cm/s LVOT diam:     2.10 cm      LV E/e' lateral: 14.8 LV SV:         71 LV SV Index:   34 LVOT Area:     3.46 cm  LV Volumes (MOD) LV vol d, MOD A2C: 101.0 ml LV vol d, MOD A4C: 119.0 ml LV vol s, MOD A2C: 56.7 ml LV vol s, MOD A4C: 52.8 ml  LV SV MOD A2C:     44.3 ml LV SV MOD A4C:     119.0 ml LV SV MOD BP:      56.6 ml RIGHT VENTRICLE             IVC RV Basal diam:  2.70 cm     IVC diam: 2.10 cm RV S prime:     10.60 cm/s TAPSE (M-mode): 1.9 cm LEFT ATRIUM             Index        RIGHT ATRIUM           Index LA diam:        4.60 cm 2.17 cm/m   RA Area:     13.70 cm LA Vol (A2C):   54.8 ml 25.82 ml/m  RA Volume:   34.70 ml  16.35 ml/m LA Vol  (A4C):   64.8 ml 30.54 ml/m LA Biplane Vol: 59.6 ml 28.09 ml/m  AORTIC VALVE LVOT Vmax:   92.00 cm/s LVOT Vmean:  73.500 cm/s LVOT VTI:    0.206 m  AORTA Ao Root diam: 3.20 cm Ao Asc diam:  3.50 cm MITRAL VALVE MV Area (PHT): 4.17 cm       SHUNTS MV Decel Time: 182 msec       Systemic VTI:  0.21 m MR Peak grad:    128.1 mmHg   Systemic Diam: 2.10 cm MR Mean grad:    85.0 mmHg MR Vmax:         566.00 cm/s MR Vmean:        430.0 cm/s MR PISA:         0.57 cm MR PISA Eff ROA: 4 mm MR PISA Radius:  0.30 cm MV E velocity: 88.70 cm/s MV A velocity: 45.00 cm/s MV E/A ratio:  1.97 Lennie Odor MD Electronically signed by Lennie Odor MD Signature Date/Time: 12/12/2022/2:06:19 PM    Final (Updated)    CT ANGIO HEAD NECK W WO CM  Result Date: 12/12/2022 CLINICAL DATA:  Stroke, determine embolic source EXAM: CT ANGIOGRAPHY HEAD AND NECK WITH AND WITHOUT CONTRAST TECHNIQUE: Multidetector CT imaging of the head and neck was performed using the standard protocol during bolus administration of intravenous contrast. Multiplanar CT image reconstructions and MIPs were obtained to evaluate the vascular anatomy. Carotid stenosis measurements (when applicable) are obtained utilizing NASCET criteria, using the distal internal carotid diameter as the denominator. RADIATION DOSE REDUCTION: This exam was performed according to the departmental dose-optimization program which includes automated exposure control, adjustment of the mA and/or kV according to patient size and/or use of iterative reconstruction technique. CONTRAST:  75mL OMNIPAQUE IOHEXOL 350 MG/ML SOLN COMPARISON:  Brain MRI from yesterday.  CTA of the neck 06/11/2016 FINDINGS: CT HEAD FINDINGS Brain: Recent infarcts in the left cerebellum confirmed by MRI. Small chronic infarcts in the superior left frontal lobe in 2 separate locations. No acute hemorrhage, hydrocephalus, or mass. Vascular: No hyperdense vessel or unexpected calcification. Skull: Normal. Negative  for fracture or focal lesion. Sinuses/Orbits: No acute finding. Review of the MIP images confirms the above findings CTA NECK FINDINGS Aortic arch: Atheromatous plaque with 3 vessel branching Right carotid system: Atheromatous calcification scratch the atheromatous wall thickening with greatest and partially calcified plaque at the bifurcation. No stenosis or ulceration. Left carotid system: Low-density atheromatous wall thickening especially of the common carotid. No stenosis or ulceration. Vertebral arteries: Diffusely attenuated left vertebral artery with thready flow, a change since 2018. This is a  presumed dissection. The patient sustained a left carotid dissection previously. No underlying vasculopathy is detected. Skeleton: No acute finding Other neck: No acute finding Upper chest: Clear apical lungs Review of the MIP images confirms the above findings CTA HEAD FINDINGS Anterior circulation: Atheromatous calcification of the cavernous carotids. No branch occlusion, beading, or aneurysm. No proximal flow reducing stenosis. Posterior circulation: Attenuated left vertebral artery continues to the basilar, a change from prior. Fetal type right PCA flow. No branch occlusion, beading, or aneurysm. Venous sinuses: Unremarkable Anatomic variants: None significant Review of the MIP images confirms the above findings IMPRESSION: 1. The patient's left cerebellar infarcts are associated with left vertebral dissection with diffusely and highly attenuated left vertebral artery that is new from a 2018 CTA. 2. Atherosclerosis without flow reducing atheromatous stenosis. Electronically Signed   By: Tiburcio Pea M.D.   On: 12/12/2022 07:40   MR Brain W and Wo Contrast  Result Date: 12/11/2022 CLINICAL DATA:  Dizzy, previous history of stroke, possible infarct on CT EXAM: MRI HEAD WITHOUT AND WITH CONTRAST TECHNIQUE: Multiplanar, multiecho pulse sequences of the brain and surrounding structures were obtained without and  with intravenous contrast. CONTRAST:  10mL GADAVIST GADOBUTROL 1 MMOL/ML IV SOLN COMPARISON:  MRI head 02/24/2015, correlation is made with same day CT head FINDINGS: Brain: Restricted diffusion with ADC correlate in the inferomedial left cerebellum (series 5, image 62 and series 7, image 51), which is associated with increased T2 hyperintense signal and correlates with the hypodense focus noted on the same-day CT. Additional peripheral focus of restricted diffusion with ADC correlate lateral left cerebellum (series 5, image 65), which is also associated with increased T2 hyperintense signal but is not particularly hypodense on the same-day CT. Enhancing 7 mm focus in the left cerebellum (series 16, image 10). No other abnormal parenchymal or meningeal enhancement. No acute hemorrhage, mass, mass effect, or midline shift. No hydrocephalus or extra-axial collection. Pituitary and craniocervical junction within normal limits. Remote infarcts in the left anterior frontal lobe and left frontoparietal region. Additional remote lacunar infarcts in the bilateral cerebellar hemispheres Vascular: Normal arterial flow voids. Skull and upper cervical spine: Normal marrow signal. Sinuses/Orbits: Clear paranasal sinuses. No acute finding in the orbits. Other: Fluid in the right mastoid air cells. IMPRESSION: 1. Acute infarct in the inferomedial left cerebellum, which correlates with the hypodense focus noted on the same-day CT. Additional smaller acute infarct in the lateral left cerebellum. 2. Enhancing focus in the left cerebellum is favored to represent a subacute infarct, although an enhancing neoplastic lesion could appear similar. Attention on follow-up. These results were called by telephone at the time of interpretation on 12/11/2022 at 7:47 pm to provider Claudette Stapler, PA, who verbally acknowledged these results. Electronically Signed   By: Wiliam Ke M.D.   On: 12/11/2022 19:49   CT Head Wo Contrast  Result  Date: 12/11/2022 CLINICAL DATA:  dizzy, HA, h/o stroke EXAM: CT HEAD WITHOUT CONTRAST TECHNIQUE: Contiguous axial images were obtained from the base of the skull through the vertex without intravenous contrast. RADIATION DOSE REDUCTION: This exam was performed according to the departmental dose-optimization program which includes automated exposure control, adjustment of the mA and/or kV according to patient size and/or use of iterative reconstruction technique. COMPARISON:  CT head 02/25/15 FINDINGS: Brain: No hemorrhage. No hydrocephalus. No extra-axial fluid collection. No mass effect. No mass lesion. Chronic infarcts in the left frontal lobe. Mineralization of the basal ganglia bilaterally. Possible acute infarcts in the medial aspect of the  left cerebellum (series 5, image 20). Vascular: No hyperdense vessel or unexpected calcification. Skull: Normal. Negative for fracture or focal lesion. Sinuses/Orbits: No middle ear or mastoid effusion. Paranasal sinuses are clear. Orbits are unremarkable. Other: None IMPRESSION: 1. Possible acute infarct in the medial aspect of the left cerebellum. Recommend brain MRI for further evaluation. 2. Chronic infarcts in the left frontal lobe. Electronically Signed   By: Lorenza Cambridge M.D.   On: 12/11/2022 15:48      Subjective: No significant deficits today, reports all deficits has resolved  Discharge Exam: Vitals:   12/12/22 1004 12/12/22 1005  BP:    Pulse:    Resp: 15 (!) 22  Temp:    SpO2:     Vitals:   12/12/22 1002 12/12/22 1003 12/12/22 1004 12/12/22 1005  BP:      Pulse:      Resp: 14 (!) 21 15 (!) 22  Temp:      TempSrc:      SpO2:      Weight:      Height:        General: Pt is alert, awake, not in acute distress Cardiovascular: RRR, S1/S2 +, no rubs, no gallops Respiratory: CTA bilaterally, no wheezing, no rhonchi Abdominal: Soft, NT, ND, bowel sounds + Extremities: no edema, no cyanosis    The results of significant diagnostics  from this hospitalization (including imaging, microbiology, ancillary and laboratory) are listed below for reference.     Microbiology: No results found for this or any previous visit (from the past 240 hour(s)).   Labs: BNP (last 3 results) No results for input(s): "BNP" in the last 8760 hours. Basic Metabolic Panel: Recent Labs  Lab 12/11/22 1127 12/11/22 1141 12/12/22 0442  NA 136 137 136  K 3.8 3.6 3.1*  CL 99 98 101  CO2 28  --  26  GLUCOSE 286* 284* 200*  BUN 16 17 15   CREATININE 0.99 1.10* 0.94  CALCIUM 9.1  --  8.8*  MG 1.7  --   --    Liver Function Tests: No results for input(s): "AST", "ALT", "ALKPHOS", "BILITOT", "PROT", "ALBUMIN" in the last 168 hours. No results for input(s): "LIPASE", "AMYLASE" in the last 168 hours. No results for input(s): "AMMONIA" in the last 168 hours. CBC: Recent Labs  Lab 12/11/22 1127 12/11/22 1141 12/12/22 0442  WBC 7.7  --  6.7  HGB 12.1 12.6 10.8*  HCT 37.9 37.0 34.1*  MCV 86.7  --  86.1  PLT 329  --  222   Cardiac Enzymes: No results for input(s): "CKTOTAL", "CKMB", "CKMBINDEX", "TROPONINI" in the last 168 hours. BNP: Invalid input(s): "POCBNP" CBG: Recent Labs  Lab 12/11/22 1613 12/11/22 2215 12/12/22 0803 12/12/22 1137 12/12/22 1619  GLUCAP 102* 162* 229* 175* 149*   D-Dimer No results for input(s): "DDIMER" in the last 72 hours. Hgb A1c Recent Labs    12/12/22 0442  HGBA1C 8.3*   Lipid Profile Recent Labs    12/12/22 0442  CHOL 187  HDL 35*  LDLCALC 128*  TRIG 119  CHOLHDL 5.3   Thyroid function studies No results for input(s): "TSH", "T4TOTAL", "T3FREE", "THYROIDAB" in the last 72 hours.  Invalid input(s): "FREET3" Anemia work up No results for input(s): "VITAMINB12", "FOLATE", "FERRITIN", "TIBC", "IRON", "RETICCTPCT" in the last 72 hours. Urinalysis    Component Value Date/Time   COLORURINE YELLOW 12/11/2022 1125   APPEARANCEUR CLEAR 12/11/2022 1125   LABSPEC 1.013 12/11/2022 1125    PHURINE 5.0 12/11/2022 1125  GLUCOSEU NEGATIVE 12/11/2022 1125   HGBUR NEGATIVE 12/11/2022 1125   BILIRUBINUR NEGATIVE 12/11/2022 1125   BILIRUBINUR negative 03/24/2018 0906   BILIRUBINUR negative 02/21/2017 1544   KETONESUR NEGATIVE 12/11/2022 1125   PROTEINUR NEGATIVE 12/11/2022 1125   UROBILINOGEN 0.2 03/24/2018 0906   UROBILINOGEN 0.2 07/02/2007 1121   NITRITE NEGATIVE 12/11/2022 1125   LEUKOCYTESUR NEGATIVE 12/11/2022 1125   Sepsis Labs Recent Labs  Lab 12/11/22 1127 12/12/22 0442  WBC 7.7 6.7   Microbiology No results found for this or any previous visit (from the past 240 hour(s)).   Time coordinating discharge: Over 30 minutes  SIGNED:   Huey Bienenstock, MD  [facility] 12/12/2022, 4:41 PM Pager   If 7PM-7AM, please contact night-coverage [email or web address]

## 2022-12-12 NOTE — Progress Notes (Signed)
  Echocardiogram 2D Echocardiogram has been performed.  Milda Smart 12/12/2022, 10:47 AM

## 2022-12-12 NOTE — Evaluation (Signed)
Occupational Therapy Evaluation Patient Details Name: [name] MRN: [id number] DOB: 1965/03/30 Today's Date: 12/12/2022   History of Present Illness 57 y.o. female who presented to the ED for evaluation of dizziness and rightward drift. MRI brain with and without contrast showed acute infarct in the inferomedial left cerebellum and additional smaller acute infarct in the lateral left cerebellum. Past medical history significant for chronic HFrEF (EF 35-40%), history of CVA, T2DM, HTN, HLD, asthma, cataracts, DM II   Clinical Impression   Pt admitted for above, she is completing ADLs and ambulating in room independently no AD, presenting at or near functional baseline. No visual deficits noted on vision assessment. Pt has no further acute skilled OT needs. No follow-up OT needed.      If plan is discharge home, recommend the following: Other (comment) (n/a)    Functional Status Assessment  Patient has not had a recent decline in their functional status  Equipment Recommendations  None recommended by OT    Recommendations for Other Services       Precautions / Restrictions Precautions Precautions: None Restrictions Weight Bearing Restrictions: No      Mobility Bed Mobility Overal bed mobility: Independent                  Transfers Overall transfer level: Independent Equipment used: None                      Balance Overall balance assessment: Mild deficits observed, not formally tested                                         ADL either performed or assessed with clinical judgement   ADL Overall ADL's : Independent                                       General ADL Comments: pt reports completing dressing and grooming routine earlier independently, cleaned up room without assist     Vision   Vision Assessment?: Yes Eye Alignment: Within Functional Limits Ocular Range of Motion: Within Functional  Limits Alignment/Gaze Preference: Within Defined Limits Tracking/Visual Pursuits: Able to track stimulus in all quads without difficulty Visual Fields: No apparent deficits Diplopia Assessment: Other (comment) [facility])     Perception         Praxis         Pertinent Vitals/Pain Pain Assessment Pain Assessment: No/denies pain     Extremity/Trunk Assessment Upper Extremity Assessment Upper Extremity Assessment: Overall WFL for tasks assessed   Lower Extremity Assessment Lower Extremity Assessment: Defer to PT evaluation   Cervical / Trunk Assessment Cervical / Trunk Assessment: Normal   Communication Communication Communication: No apparent difficulties   Cognition Arousal: Alert Behavior During Therapy: WFL for tasks assessed/performed Overall Cognitive Status: Within Functional Limits for tasks assessed                                       General Comments  VSS, BP seated 160/74 (99) and standing 166/79(104) . Pt reported she had not had her typical BP medications yet    Exercises     Shoulder Instructions      Home Living Family/patient expects to be discharged  to:: Private residence Living Arrangements: Non-relatives/Friends Jonny Ruiz) Available Help at Discharge: Available 24 hours/day;Friend(s) Type of Home: House Home Access: Stairs to enter [employer] of Steps: 1 small   Home Layout: One level     Bathroom Shower/Tub: Producer, television/film/video: Handicapped height Bathroom Accessibility: Yes How Accessible: Accessible via wheelchair Home Equipment: Shower seat - built Charity fundraiser (2 wheels);Cane - single point;Crutches;Grab bars - tub/shower;Grab bars - toilet   Additional Comments: Order processor at [employer]      Prior Functioning/Environment Prior Level of Function : Independent/Modified Independent;Driving;Working/employed             Mobility Comments: ind no AD ADLs Comments: ind        OT  Problem List: Impaired balance (sitting and/or standing)      OT Treatment/Interventions:      OT Goals(Current goals can be found in the care plan section) Acute Rehab OT Goals Patient Stated Goal: To go home OT Goal Formulation: With patient Time For Goal Achievement: 12/26/22 Potential to Achieve Goals: Good  OT Frequency:      Co-evaluation              AM-PAC OT "6 Clicks" Daily Activity     Outcome Measure Help from another person eating meals?: None Help from another person taking care of personal grooming?: None Help from another person toileting, which includes using toliet, bedpan, or urinal?: None Help from another person bathing (including washing, rinsing, drying)?: None Help from another person to put on and taking off regular upper body clothing?: None Help from another person to put on and taking off regular lower body clothing?: None 6 Click Score: 24   End of Session Nurse Communication: Mobility status  Activity Tolerance: Patient tolerated treatment well Patient left: in bed;with call bell/phone within reach  OT Visit Diagnosis: Unsteadiness on feet (R26.81)                Time: 4098-1191 OT Time Calculation (min): 14 min Charges:  OT General Charges $OT Visit: 1 Visit OT Evaluation $OT Eval Low Complexity: 1 Low  12/12/2022  AB, OTR/L  Acute Rehabilitation Services  Office: [phone]   Tristan Schroeder 12/12/2022, 9:54 AM

## 2022-12-12 NOTE — Progress Notes (Signed)
SLP Cancellation Note  Patient Details Name: [name] MRN: [id number] DOB: 08-21-1965   Cancelled treatment:        Attempted to see pt for cognitive-linguistic evaluation.  Unfortunately pt was not available at time of attempt as she was receiving a procedure at bedside. SLP will reattempt as schedule permits.    Kerrie Pleasure, MA, CCC-SLP Acute Rehabilitation Services Office: [phone] 12/12/2022, 10:19 AM

## 2022-12-13 ENCOUNTER — Telehealth: Payer: Self-pay

## 2022-12-13 NOTE — Transitions of Care (Post Inpatient/ED Visit) (Signed)
12/13/2022  Name: [name] MRN: [id number] DOB: [birth year]  Today's TOC FU Call Status: Today's TOC FU Call Status:: Successful TOC FU Call Completed TOC FU Call Complete Date: 12/13/22 Patient's Name and Date of Birth confirmed.  Transition Care Management Follow-up Telephone Call Date of Discharge: 12/12/22 Discharge Facility: [facility]) Type of Discharge: Inpatient Admission Primary Inpatient Discharge Diagnosis:: new cerebellar infarct How have you been since you were released from the hospital?: Better Any questions or concerns?: No  Items Reviewed: Did you receive and understand the discharge instructions provided?: Yes Medications obtained,verified, and reconciled?: Partial Review Completed Medications Not Reviewed Reasons:: Other: Reason for Partial Mediation Review: She said she has all of her medications but did not have them with her.  She said she does not have a Dexcom and uses a standard glucometer.  She did not have any questions about the med regime Any new allergies since your discharge?: No Dietary orders reviewed?: No Do you have support at home?: Yes Name of Support/Comfort Primary Source: She said she has help at home but did not specify who.  Medications Reviewed Today: Medications Reviewed Today   Medications were not reviewed in this encounter     Home Care and Equipment/Supplies: Were Home Health Services Ordered?: No Any new equipment or medical supplies ordered?: No  Functional Questionnaire: Do you need assistance with bathing/showering or dressing?: No Do you need assistance with meal preparation?: No Do you need assistance with eating?: No Do you have difficulty maintaining continence: No Do you need assistance with getting out of bed/getting out of a chair/moving?: No Do you have difficulty managing or taking your medications?: No  Follow up appointments reviewed: PCP Follow-up appointment confirmed?: Yes Date of PCP follow-up  appointment?: 02/05/23 (She said she will call the clinic for an earlier appointment but she needs to plan her schedule first. I offered her an appointment on 12/24/2022 but she was not sure if she could make it.) Follow-up Provider: [facility] Follow-up appointment confirmed?: Yes Date of Specialist follow-up appointment?: 01/15/23 Follow-Up Specialty Provider:: diabetes education.   A referral was placed to neurology at the time of discharge Do you need transportation to your follow-up appointment?: No Do you understand care options if your condition(s) worsen?: Yes-patient verbalized understanding    SIGNATURE Robyne Peers, RN

## 2022-12-14 ENCOUNTER — Telehealth (INDEPENDENT_AMBULATORY_CARE_PROVIDER_SITE_OTHER): Payer: Self-pay

## 2022-12-14 NOTE — Telephone Encounter (Signed)
Copied from CRM [id number]. Topic: Appointment Scheduling - Scheduling Inquiry for Clinic >> Dec 13, 2022  4:45 PM Turkey B wrote: Reason for CRM: pt called in needs sooner hosp fu,, discharged from [facility] 10/30. I dont show anything until late November

## 2022-12-24 ENCOUNTER — Ambulatory Visit: Payer: PRIVATE HEALTH INSURANCE | Attending: Internal Medicine | Admitting: Internal Medicine

## 2022-12-24 ENCOUNTER — Encounter: Payer: Self-pay | Admitting: Internal Medicine

## 2022-12-24 VITALS — BP 118/77 | HR 92 | Wt 204.6 lb

## 2022-12-24 DIAGNOSIS — Z09 Encounter for follow-up examination after completed treatment for conditions other than malignant neoplasm: Secondary | ICD-10-CM

## 2022-12-24 DIAGNOSIS — Z7984 Long term (current) use of oral hypoglycemic drugs: Secondary | ICD-10-CM

## 2022-12-24 DIAGNOSIS — E1169 Type 2 diabetes mellitus with other specified complication: Secondary | ICD-10-CM

## 2022-12-24 DIAGNOSIS — Z23 Encounter for immunization: Secondary | ICD-10-CM

## 2022-12-24 DIAGNOSIS — Z8673 Personal history of transient ischemic attack (TIA), and cerebral infarction without residual deficits: Secondary | ICD-10-CM | POA: Diagnosis not present

## 2022-12-24 DIAGNOSIS — M79605 Pain in left leg: Secondary | ICD-10-CM

## 2022-12-24 DIAGNOSIS — E669 Obesity, unspecified: Secondary | ICD-10-CM

## 2022-12-24 DIAGNOSIS — E559 Vitamin D deficiency, unspecified: Secondary | ICD-10-CM

## 2022-12-24 DIAGNOSIS — E1159 Type 2 diabetes mellitus with other circulatory complications: Secondary | ICD-10-CM | POA: Diagnosis not present

## 2022-12-24 DIAGNOSIS — I7774 Dissection of vertebral artery: Secondary | ICD-10-CM | POA: Diagnosis not present

## 2022-12-24 DIAGNOSIS — Z1211 Encounter for screening for malignant neoplasm of colon: Secondary | ICD-10-CM

## 2022-12-24 DIAGNOSIS — E119 Type 2 diabetes mellitus without complications: Secondary | ICD-10-CM

## 2022-12-24 DIAGNOSIS — I152 Hypertension secondary to endocrine disorders: Secondary | ICD-10-CM

## 2022-12-24 DIAGNOSIS — D649 Anemia, unspecified: Secondary | ICD-10-CM

## 2022-12-24 LAB — HM DIABETES EYE EXAM

## 2022-12-24 MED ORDER — PANTOPRAZOLE SODIUM 40 MG PO TBEC: 40.0000 mg | DELAYED_RELEASE_TABLET | Freq: Every day | ORAL | 1 refills | Status: AC

## 2022-12-24 MED ORDER — CLOPIDOGREL BISULFATE 75 MG PO TABS: 75.0000 mg | ORAL_TABLET | Freq: Every day | ORAL | 0 refills | Status: DC

## 2022-12-24 MED ORDER — EZETIMIBE 10 MG PO TABS: 10.0000 mg | ORAL_TABLET | Freq: Every day | ORAL | 2 refills | Status: DC

## 2022-12-24 MED ORDER — EMPAGLIFLOZIN 10 MG PO TABS: 10.0000 mg | ORAL_TABLET | Freq: Every day | ORAL | 4 refills | Status: DC

## 2022-12-24 NOTE — Progress Notes (Signed)
Patient ID: [name], female    DOB: August 27, 1965  MRN: [id number]  CC: TOC visit Date of hospitalization 10/29-30/2024 Date of call from case worker 12/13/2022 Hospitalization Follow-up (Pt states to having leg discomfort (left leg), also had question about ozempic,vitD, and Asprin )   Subjective: [name] is a 57 y.o. female who presents for hosp f/u/transition of care visit Her concerns today include:  DM with neuropathy, HL, HTN, DCM/sys and diastolic CHF with EF35-40% on 08/8467, lung nodules resolved, hx of LT common carotid dissection, CKD 2-3, mild anemia,  vit D def, anxiety/depression, mild intermittent asthma.   Patient was hospitalized with acute onset of dizziness and rightward drift.  Imaging of the head revealed acute infarct in the inferior/medial and lateral left cerebellum.  CTA of the head and neck revealed left vertebral dissection with diffusely and highly attenuated left vertebral artery that is new from 2018.  Case was discussed with neurology.  Patient sent out on DAPT with aspirin and Plavix for 90 days then aspirin alone.  Echo revealed EF of 35 to 40%.  No PT-OT-SLP needs were identified.  Protonix started for GI prophylaxis.  Patient was enrolled in a stroke study called [facility] - asundexian 50 mg/or placebo. A1c was 8.3.  Today: Patient reports compliance with taking aspirin, Plavix, Zetia and rosuvastatin.  She has also restarted all of her blood pressure medications that include carvedilol 25 mg twice a day, hydralazine 50 mg 3 times a day, isosorbide 30 mg daily, furosemide 40 mg daily and spironolactone 25 mg daily.  She has not had any reoccurrence of symptoms or dizziness.  She has noted that she walks somewhat with a limp on the left side for the past 1 month.  Denies any pain in the legs but reports pain behind the knee in the popliteal area.  She has not had any swelling in the leg.  DM: Most recent A1c was 8.3 that has improved from 9.6  when we last saw her.  She continues with Trulicity 1.5 mg once a week, Lantus insulin 40 units daily and metformin 500 mg once a day.  Checks blood sugars every other day 30 to 60 minutes after meal.  Reports blood sugar readings have been in the 200s.  She is tolerating Trulicity.  She has lost 9 pounds since her last visit with me in August of this year.  Feels she is doing okay with her eating habits.  Wanted to know what her vitamin D level is and whether she needs to be taking vitamin D supplement.  History of vitamin D deficiency in the past.  HM: Agrees for flu shot.  Had diabetic eye exam done today with [facility].  Cologuard was ordered on previous visit.  Patient states she never received it because she has since moved.  Patient Active Problem List   Diagnosis Date Noted   New cerebellar infarct (HCC) 12/11/2022   Chronic HFrEF (heart failure with reduced ejection fraction) (HCC) 12/11/2022   Insulin dependent type 2 diabetes mellitus (HCC) 12/11/2022   NPDR (nonproliferative diabetic retinopathy) (HCC) 10/04/2021   Acute cough 04/03/2021   Upper respiratory tract infection 04/03/2021   Anxiety and depression 01/17/2021   Sinus tachycardia 10/15/2018   Vitamin D deficiency 03/26/2018   Homeless 03/24/2018   Microalbuminuria 08/11/2017   Anemia 08/08/2017   Physical deconditioning    Hyperlipidemia 02/21/2017   Diabetic polyneuropathy associated with type 2 diabetes mellitus (HCC) 02/21/2017   Lung nodules  07/16/2016   Uncontrolled type 2 diabetes mellitus without complication, with long-term current use of insulin 07/16/2016   DCM (dilated cardiomyopathy) (HCC)    Hypertension associated with diabetes (HCC) 06/10/2016   Dyslipidemia associated with type 2 diabetes mellitus (HCC) 06/10/2016     Current Outpatient Medications on File Prior to Visit  Medication Sig Dispense Refill   albuterol (VENTOLIN HFA) 108 (90 Base) MCG/ACT inhaler Inhale 2 puffs into the  lungs every 6 (six) hours as needed for wheezing or shortness of breath. 18 g 6   aspirin 81 MG chewable tablet Chew 1 tablet (81 mg total) by mouth daily. 30 tablet 3   carvedilol (COREG) 25 MG tablet Take 25 mg by mouth 2 (two) times daily.     clopidogrel (PLAVIX) 75 MG tablet Take 1 tablet (75 mg total) by mouth daily. 30 tablet 2   Continuous Blood Gluc Transmit (DEXCOM G6 TRANSMITTER) MISC 1 packet by Does not apply route daily. 1 each 4   Dulaglutide (TRULICITY) 1.5 MG/0.5ML SOPN Inject 1.5 mg into the skin once a week. (Patient taking differently: Inject 1.5 mg into the skin once a week. Tuesdays.) 2 mL 6   ezetimibe (ZETIA) 10 MG tablet Take 1 tablet (10 mg total) by mouth daily. 30 tablet 2   furosemide (LASIX) 40 MG tablet TAKE 1 TABLET BY MOUTH EVERY DAY 90 tablet 0   gabapentin (NEURONTIN) 100 MG capsule Take 2 capsules (200 mg total) by mouth at bedtime. For diabetic neuropathy symptoms 180 capsule 2   hydrALAZINE (APRESOLINE) 50 MG tablet Take 1 tablet (50 mg total) by mouth 3 (three) times daily.     isosorbide mononitrate (IMDUR) 30 MG 24 hr tablet Take 1 tablet (30 mg total) by mouth daily.     LANTUS SOLOSTAR 100 UNIT/ML Solostar Pen Inject 40 Units into the skin daily. 45 mL 6   metFORMIN (GLUCOPHAGE-XR) 500 MG 24 hr tablet Take 1 tablet (500 mg total) by mouth daily with breakfast.     pantoprazole (PROTONIX) 40 MG tablet Take 1 tablet (40 mg total) by mouth daily. 90 tablet 0   rosuvastatin (CRESTOR) 40 MG tablet Take 1 tablet (40 mg total) by mouth daily. 90 tablet 3   spironolactone (ALDACTONE) 25 MG tablet Take 1 tablet (25 mg total) by mouth daily. 90 tablet 3   No current facility-administered medications on file prior to visit.    Allergies  Allergen Reactions   Lisinopril Hives and Swelling    Facial    Sertraline Hives and Swelling    Facial    Tramadol Hives and Swelling    facial   Ibuprofen Hives and Swelling    Social History   Socioeconomic History    Marital status: Single    Spouse name: Not on file   Number of children: 2   Years of education: Not on file   Highest education level: Associate degree: academic program  Occupational History   Not on file  Tobacco Use   Smoking status: Never   Smokeless tobacco: Never  Vaping Use   Vaping status: Never Used  Substance and Sexual Activity   Alcohol use: No   Drug use: No   Sexual activity: Yes    Birth control/protection: None  Other Topics Concern   Not on file  Social History Narrative   Lives in [place].  Presently unemployed but attends school full time   Social Determinants of Health   Financial Resource Strain: Low Risk  (12/24/2022)  Overall Financial Resource Strain (CARDIA)    Difficulty of Paying Living Expenses: Not hard at all  Food Insecurity: No Food Insecurity (12/24/2022)   Hunger Vital Sign    Worried About Running Out of Food in the Last Year: Never true    Ran Out of Food in the Last Year: Never true  Transportation Needs: No Transportation Needs (12/24/2022)   PRAPARE - Administrator, Civil Service (Medical): No    Lack of Transportation (Non-Medical): No  Physical Activity: Insufficiently Active (12/24/2022)   Exercise Vital Sign    Days of Exercise per Week: 3 days    Minutes of Exercise per Session: 30 min  Stress: No Stress Concern Present (12/24/2022)   [employer] of Occupational Health - Occupational Stress Questionnaire    Feeling of Stress : Not at all  Social Connections: Socially Integrated (12/24/2022)   Social Connection and Isolation Panel [NHANES]    Frequency of Communication with Friends and Family: Twice a week    Frequency of Social Gatherings with Friends and Family: Twice a week    Attends Religious Services: More than 4 times per year    Active Member of [employer] or Organizations: Yes    Attends Engineer, structural: More than 4 times per year    Marital Status: Married  Catering manager  Violence: Not At Risk (12/11/2022)   Humiliation, Afraid, Rape, and Kick questionnaire    Fear of Current or Ex-Partner: No    Emotionally Abused: No    Physically Abused: No    Sexually Abused: No    Family History  Problem Relation Age of Onset   Diabetes Other        multiple   Heart failure Paternal Grandmother    Breast cancer Paternal Grandmother    Heart disease Other        multiple   Breast cancer Mother     Past Surgical History:  Procedure Laterality Date   BREAST BIOPSY Left 05/24/2022   MM LT BREAST BX W LOC DEV 1ST LESION IMAGE BX SPEC STEREO GUIDE 05/24/2022 [facility]   CERVIX LESION DESTRUCTION     DILATION AND CURETTAGE OF UTERUS     LEFT HEART CATHETERIZATION WITH CORONARY ANGIOGRAM N/A 05/21/2011   Procedure: LEFT HEART CATHETERIZATION WITH CORONARY ANGIOGRAM;  Surgeon: Tonny Bollman, MD;  Location: [facility] CATH LAB;  Service: Cardiovascular;  Laterality: N/A;   TUBAL LIGATION  1996    ROS: Review of Systems Negative except as stated above  PHYSICAL EXAM: BP 118/77 (BP Location: Right Arm, Patient Position: Sitting, Cuff Size: Normal)   Pulse 92   Wt 204 lb 9.6 oz (92.8 kg)   LMP  (LMP Unknown) Comment: over a year ago  SpO2 98%   BMI 30.21 kg/m   Wt Readings from Last 3 Encounters:  12/24/22 204 lb 9.6 oz (92.8 kg)  12/11/22 213 lb (96.6 kg)  11/06/22 210 lb 9.6 oz (95.5 kg)    Physical Exam  General appearance - alert, well appearing, and in no distress Mental status - normal mood, behavior, speech, dress, motor activity, and thought processes Neck - supple, no significant adenopathy Chest - clear to auscultation, no wheezes, rales or rhonchi, symmetric air entry Heart - normal rate, regular rhythm, normal S1, S2, no murmurs, rubs, clicks or gallops Neurological -cranial nerves are grossly intact.  Power 5/5 throughout in both upper and lower extremities.  Gross sensation intact.  Gait: Noted to have a slight limp on  the left  side Extremities -no lower extremity edema.  Dorsalis pedis, posterior tibialis and popliteal pulses are 3+ bilaterally. MSK: No tenderness on palpation of the popliteal area on either side.  She has good range of motion of both knee joints.     Latest Ref Rng & Units 12/12/2022    4:42 AM 12/11/2022   11:41 AM 12/11/2022   11:27 AM  CMP  Glucose 70 - 99 mg/dL 244  010  272   BUN 6 - 20 mg/dL 15  17  16    Creatinine 0.44 - 1.00 mg/dL 5.36  6.44  0.34   Sodium 135 - 145 mmol/L 136  137  136   Potassium 3.5 - 5.1 mmol/L 3.1  3.6  3.8   Chloride 98 - 111 mmol/L 101  98  99   CO2 22 - 32 mmol/L 26   28   Calcium 8.9 - 10.3 mg/dL 8.8   9.1    Lipid Panel     Component Value Date/Time   CHOL 187 12/12/2022 0442   CHOL 123 10/04/2022 1636   TRIG 119 12/12/2022 0442   HDL 35 (L) 12/12/2022 0442   HDL 40 10/04/2022 1636   CHOLHDL 5.3 12/12/2022 0442   VLDL 24 12/12/2022 0442   LDLCALC 128 (H) 12/12/2022 0442   LDLCALC 65 10/04/2022 1636    CBC    Component Value Date/Time   WBC 6.7 12/12/2022 0442   RBC 3.96 12/12/2022 0442   HGB 10.8 (L) 12/12/2022 0442   HGB 12.4 02/26/2022 0955   HCT 34.1 (L) 12/12/2022 0442   HCT 39.3 02/26/2022 0955   PLT 222 12/12/2022 0442   PLT 208 02/26/2022 0955   MCV 86.1 12/12/2022 0442   MCV 87 02/26/2022 0955   MCH 27.3 12/12/2022 0442   MCHC 31.7 12/12/2022 0442   RDW 14.3 12/12/2022 0442   RDW 13.1 02/26/2022 0955   LYMPHSABS 2.5 07/31/2017 1225   MONOABS 0.6 06/02/2017 0530   EOSABS 0.2 07/31/2017 1225   BASOSABS 0.1 07/31/2017 1225    ASSESSMENT AND PLAN: 1. Hospital discharge follow-up   2. History of cerebrovascular accident (CVA) involving cerebellum 3. Dissection, vertebral artery (HCC) Discussed importance of good blood pressure, diabetes and cholesterol control.  Advised patient to continue the Plavix for total of 3 months.  I gave her a refill on it to last until that time.  Continue aspirin lifelong.  On pantoprazole for  GI prophylaxis.  Patient enrolled in a stroke study. Continue Crestor and Zetia She is to follow-up with neurology. - clopidogrel (PLAVIX) 75 MG tablet; Take 1 tablet (75 mg total) by mouth daily.  Dispense: 60 tablet; Refill: 0 - ezetimibe (ZETIA) 10 MG tablet; Take 1 tablet (10 mg total) by mouth daily.  Dispense: 90 tablet; Refill: 2 - Ambulatory referral to Neurology - Basic Metabolic Panel     4. Type 2 diabetes mellitus with obesity (HCC) A1c improved but not at goal.  She will continue Trulicity 1.5 mg once a week, Lantus insulin 40 units daily and metformin 500 mg daily.  Add Jardiance 10 mg daily.  Has CV benefits. - empagliflozin (JARDIANCE) 10 MG TABS tablet; Take 1 tablet (10 mg total) by mouth daily before breakfast.  Dispense: 30 tablet; Refill: 4  5. Hypertension associated with diabetes (HCC) At goal.  Continue blood pressure medications listed above except I recommend that she cut the dose of furosemide 40 mg to half a tablet daily once she starts the Jardiance.  Advised to expect increased urination with Jardiance  6. Normocytic anemia - CBC - Iron, TIBC and Ferritin Panel  7. Vitamin D deficiency - VITAMIN D 25 Hydroxy (Vit-D Deficiency, Fractures)  8. Leg pain, left This is of questionable etiology.  Pain is behind the knee in the popliteal area.  She has no swelling of the calf.  Will get a Doppler ultrasound to see if she has a Baker's cyst. - VAS [place] LOWER EXTREMITY VENOUS (DVT); Future  9. Need for influenza vaccination Given.  10. Screening for colon cancer - Fecal occult blood, imunochemical([facility])     Patient was given the opportunity to ask questions.  Patient verbalized understanding of the plan and was able to repeat key elements of the plan.   This documentation was completed using Paediatric nurse.  Any transcriptional errors are unintentional.  No orders of the defined types were placed in this  encounter.    Requested Prescriptions    No prescriptions requested or ordered in this encounter    No follow-ups on file.  Jonah Blue, MD, FACP

## 2022-12-24 NOTE — Patient Instructions (Addendum)
Start Jardiance 10 mg daily for help with Diabetes and preventing another stroke.  This medication will cause you to urinate a bit more.  Once you start this medication, you should cut the furosemide 40 mg to 1/2 tablet daily.   Marland Kitchen

## 2022-12-25 ENCOUNTER — Other Ambulatory Visit: Payer: Self-pay | Admitting: Internal Medicine

## 2022-12-25 LAB — IRON,TIBC AND FERRITIN PANEL
Ferritin: 147 ng/mL (ref 15–150)
Iron Saturation: 24 % (ref 15–55)
Iron: 79 ug/dL (ref 27–159)
Total Iron Binding Capacity: 328 ug/dL (ref 250–450)
UIBC: 249 ug/dL (ref 131–425)

## 2022-12-25 LAB — CBC
Hematocrit: 43 % (ref 34.0–46.6)
Hemoglobin: 13.5 g/dL (ref 11.1–15.9)
MCH: 27.5 pg (ref 26.6–33.0)
MCHC: 31.4 g/dL — ABNORMAL LOW (ref 31.5–35.7)
MCV: 88 fL (ref 79–97)
Platelets: 340 10*3/uL (ref 150–450)
RBC: 4.91 x10E6/uL (ref 3.77–5.28)
RDW: 13.5 % (ref 11.7–15.4)
WBC: 5 10*3/uL (ref 3.4–10.8)

## 2022-12-25 LAB — BASIC METABOLIC PANEL
BUN/Creatinine Ratio: 15 (ref 9–23)
BUN: 19 mg/dL (ref 6–24)
CO2: 26 mmol/L (ref 20–29)
Calcium: 10.1 mg/dL (ref 8.7–10.2)
Chloride: 98 mmol/L (ref 96–106)
Creatinine, Ser: 1.24 mg/dL — ABNORMAL HIGH (ref 0.57–1.00)
Glucose: 199 mg/dL — ABNORMAL HIGH (ref 70–99)
Potassium: 3.9 mmol/L (ref 3.5–5.2)
Sodium: 138 mmol/L (ref 134–144)
eGFR: 51 mL/min/{1.73_m2} — ABNORMAL LOW (ref >59)

## 2022-12-25 LAB — VITAMIN D 25 HYDROXY (VIT D DEFICIENCY, FRACTURES): Vit D, 25-Hydroxy: 14 ng/mL — ABNORMAL LOW (ref 30.0–100.0)

## 2022-12-25 MED ORDER — VITAMIN D (ERGOCALCIFEROL) 1.25 MG (50000 UNIT) PO CAPS: 50000.0000 [IU] | ORAL_CAPSULE | ORAL | 1 refills | Status: AC

## 2022-12-27 ENCOUNTER — Ambulatory Visit (HOSPITAL_COMMUNITY)
Admission: RE | Admit: 2022-12-27 | Discharge: 2022-12-27 | Disposition: A | Discharge status: Home / Self Care | Payer: PRIVATE HEALTH INSURANCE | Source: Ambulatory Visit | Attending: Vascular Surgery | Admitting: Vascular Surgery

## 2022-12-27 DIAGNOSIS — M79605 Pain in left leg: Secondary | ICD-10-CM

## 2022-12-31 ENCOUNTER — Other Ambulatory Visit: Payer: Self-pay | Admitting: Internal Medicine

## 2022-12-31 DIAGNOSIS — E785 Hyperlipidemia, unspecified: Secondary | ICD-10-CM

## 2022-12-31 NOTE — Telephone Encounter (Signed)
Medication Refill -  Most Recent Primary Care Visit:  Provider: Jonah Blue B  Department: [facility]  Visit Type: HOSPITAL FU  Date: 12/24/2022  Medication: Crestor 40 mg  Has the patient contacted their pharmacy? No (Agent: If no, request that the patient contact the pharmacy for the refill. If patient does not wish to contact the pharmacy document the reason why and proceed with request.) (Agent: If yes, when and what did the pharmacy advise?)  Is this the correct pharmacy for this prescription? Yes If no, delete pharmacy and type the correct one.  This is the patient's preferred pharmacy:  Has the prescription been filled recently? Yes  Is the patient out of the medication? Yes  Has the patient been seen for an appointment in the last year OR does the patient have an upcoming appointment? Yes  Can we respond through MyChart? No  Agent: Please be advised that Rx refills may take up to 3 business days. We ask that you follow-up with your pharmacy.

## 2023-01-01 MED ORDER — ROSUVASTATIN CALCIUM 40 MG PO TABS: 40.0000 mg | ORAL_TABLET | Freq: Every day | ORAL | 1 refills | Status: DC

## 2023-01-01 NOTE — Telephone Encounter (Signed)
Requested medications are due for refill today.  yes  Requested medications are on the active medications list.  yes  Last refill. 04/26/2022 #90 3 rf  Future visit scheduled.   yes  Notes to clinic.  Rx signed by [employer].    Requested Prescriptions  Pending Prescriptions Disp Refills   rosuvastatin (CRESTOR) 40 MG tablet 90 tablet 3    Sig: Take 1 tablet (40 mg total) by mouth daily.     Cardiovascular:  Antilipid - Statins 2 Failed - 12/31/2022  5:41 PM      Failed - Cr in normal range and within 360 days    Creat  Date Value Ref Range Status  06/29/2016 0.92 0.50 - 1.10 mg/dL Final   Creatinine, Ser  Date Value Ref Range Status  12/24/2022 1.24 (H) 0.57 - 1.00 mg/dL Final         Failed - Lipid Panel in normal range within the last 12 months    Cholesterol, Total  Date Value Ref Range Status  10/04/2022 123 100 - 199 mg/dL Final   Cholesterol  Date Value Ref Range Status  12/12/2022 187 0 - 200 mg/dL Final   LDL Chol Calc (NIH)  Date Value Ref Range Status  10/04/2022 65 0 - 99 mg/dL Final   LDL Cholesterol  Date Value Ref Range Status  12/12/2022 128 (H) 0 - 99 mg/dL Final    Comment:           Total Cholesterol/HDL:CHD Risk Coronary Heart Disease Risk Table                     Men   Women  1/2 Average Risk   3.4   3.3  Average Risk       5.0   4.4  2 X Average Risk   9.6   7.1  3 X Average Risk  23.4   11.0        Use the calculated Patient Ratio above and the CHD Risk Table to determine the patient's CHD Risk.        ATP III CLASSIFICATION (LDL):  <100     mg/dL   Optimal  161-096  mg/dL   Near or Above                    Optimal  130-159  mg/dL   Borderline  045-409  mg/dL   High  >811     mg/dL   Very High Performed at [facility], [address] [place], [place] [address]    HDL  Date Value Ref Range Status  12/12/2022 35 (L) >40 mg/dL Final  29/56/2130 40 >86 mg/dL Final   Triglycerides  Date Value Ref Range Status   12/12/2022 119 <150 mg/dL Final         Passed - Patient is not pregnant      Passed - Valid encounter within last 12 months    Recent Outpatient Visits           1 week ago Hospital discharge follow-up   [facility] Dept Of [place]. [facility] Jonah Blue B, MD   2 months ago Type 2 diabetes mellitus with obesity [facility])   [facility] - A Dept Of [place]. [facility] Marcine Matar, MD   7 months ago Left ear pain   [facility],  Amy J, NP   7 months ago Pap smear for cervical cancer screening   [facility] - A Dept Of [place]. [facility] Jonah Blue B, MD   10 months ago Type 2 diabetes mellitus with obesity [facility])   [facility] - A Dept Of [place]. [facility] Marcine Matar, MD       Future Appointments             In 2 months Laural Benes Binnie Rail, MD [facility] - A Dept Of [place]igha Bridegroom. [facility]

## 2023-01-02 ENCOUNTER — Telehealth: Payer: Self-pay | Admitting: Internal Medicine

## 2023-01-02 NOTE — Telephone Encounter (Signed)
I received note from [facility] eye care.  Patient had eye exam 12/24/2022.  She does have diabetic retinopathy.  Please enter abstraction.  No need to call patient.

## 2023-01-03 NOTE — Telephone Encounter (Signed)
Abstraction completed  

## 2023-01-15 ENCOUNTER — Encounter: Payer: Self-pay | Admitting: Dietician

## 2023-01-15 ENCOUNTER — Encounter: Payer: PRIVATE HEALTH INSURANCE | Attending: Internal Medicine | Admitting: Dietician

## 2023-01-15 VITALS — Ht 69.0 in | Wt 209.0 lb

## 2023-01-15 DIAGNOSIS — Z794 Long term (current) use of insulin: Secondary | ICD-10-CM | POA: Diagnosis present

## 2023-01-15 DIAGNOSIS — E1165 Type 2 diabetes mellitus with hyperglycemia: Secondary | ICD-10-CM | POA: Diagnosis present

## 2023-01-15 NOTE — Progress Notes (Signed)
Diabetes Self-Management Education  Visit Type: First/Initial  Appt. Start Time: 1550 Appt. End Time: 1650  01/15/2023  [name], identified by name and date of birth, is a 57 y.o. female with a diagnosis of Diabetes: Type 2.   ASSESSMENT  History includes: type 2 diabetes, asthma, cataracts, CHF, HTN, stroke Labs noted: 12/12/22: A1c 8.3%, 12/24/22: eGFR 51, vitamin D 14. Medications include: trulicity, jardiance, lantus, metformin Supplements: vitamin D  Pt states she talked to a registered dietitian when she was prediabetic around 7 years ago but she wanted to follow up now that she has diabetes.   Pt states she walks 3 days a week for 30 minutes. Pt states she hasn't been doing this as much since it's gotten colder and darker outside. Pt is considering a gym membership at [employer].   Pt reports she recently had a life stressor with moving 2 weeks ago.  Used to live [employer] now lives with someone.   Pt states she ran out of trulicity and went 2 weeks without it but she is now back on it. Discussed pt's other diabetes medication.   Pt states she just started taking vitamin D after her hospital admission at the end of October.   Pt states following her admission she also switched from regular soda to diet sodas. She states she was drinking 3-4 16 oz bottles weekly prior.   Height 5\' 9"  (1.753 m), weight 209 lb (94.8 kg). Body mass index is 30.86 kg/m.   Diabetes Self-Management Education - 01/15/23 1548       Visit Information   Visit Type First/Initial      Initial Visit   Diabetes Type Type 2    Date Diagnosed 7 years ago    Are you currently following a meal plan? No    Are you taking your medications as prescribed? Yes      Health Coping   How would you rate your overall health? Good      Psychosocial Assessment   Patient Belief/Attitude about Diabetes Other (comment)   pt states "various feelings"   What is the hardest part about your diabetes  right now, causing you the most concern, or is the most worrisome to you about your diabetes?   Making healty food and beverage choices    Self-care barriers None    Self-management support Doctor's office    Other persons present Patient    Patient Concerns Nutrition/Meal planning    Special Needs None    Preferred Learning Style No preference indicated    Learning Readiness Ready    How often do you need to have someone help you when you read instructions, pamphlets, or other written materials from your doctor or pharmacy? 1 - Never    What is the last grade level you completed in school? bachelors      Pre-Education Assessment   Patient understands the diabetes disease and treatment process. Needs Instruction    Patient understands incorporating nutritional management into lifestyle. Needs Instruction    Patient undertands incorporating physical activity into lifestyle. Needs Instruction    Patient understands using medications safely. Needs Instruction    Patient understands monitoring blood glucose, interpreting and using results Needs Instruction    Patient understands prevention, detection, and treatment of acute complications. Needs Instruction    Patient understands prevention, detection, and treatment of chronic complications. Needs Instruction    Patient understands how to develop strategies to address psychosocial issues. Needs Instruction    Patient  understands how to develop strategies to promote health/change behavior. Needs Instruction      Complications   Last HgB A1C per patient/outside source 8.3 %    How often do you check your blood sugar? 1-2 times/day    Fasting Blood glucose range (mg/dL) 213-086    Have you had a dilated eye exam in the past 12 months? Yes    Have you had a dental exam in the past 12 months? Yes    Are you checking your feet? Yes    How many days per week are you checking your feet? 3      Dietary Intake   Breakfast egg & bacon sandwich     Snack (morning) grapes or banana    Lunch fast food: cookout or arbys    Snack (afternoon) none    Dinner chicken and greens and sometimes rice    Snack (evening) peanut butter crackers OR cheese crackers    Beverage(s) zero sugar soda, 3-4 bottles water, coffee occasionally, orange juice with medicine.      Activity / Exercise   Activity / Exercise Type ADL's;Light (walking / raking leaves)    How many days per week do you exercise? 3    How many minutes per day do you exercise? 30    Total minutes per week of exercise 90      Patient Education   Previous Diabetes Education No    Disease Pathophysiology Definition of diabetes, type 1 and 2, and the diagnosis of diabetes;Explored patient's options for treatment of their diabetes    Healthy Eating Role of diet in the treatment of diabetes and the relationship between the three main macronutrients and blood glucose level;Plate Method;Reviewed blood glucose goals for pre and post meals and how to evaluate the patients' food intake on their blood glucose level.;Meal timing in regards to the patients' current diabetes medication.;Information on hints to eating out and maintain blood glucose control.;Meal options for control of blood glucose level and chronic complications.    Being Active Role of exercise on diabetes management, blood pressure control and cardiac health.;Identified with patient nutritional and/or medication changes necessary with exercise.    Medications Reviewed patients medication for diabetes, action, purpose, timing of dose and side effects.;Taught/reviewed insulin/injectables, injection, site rotation, insulin/injectables storage and needle disposal.    Monitoring Identified appropriate SMBG and/or A1C goals.;Daily foot exams;Yearly dilated eye exam    Acute complications Taught prevention, symptoms, and  treatment of hypoglycemia - the 15 rule.;Discussed and identified patients' prevention, symptoms, and treatment of  hyperglycemia.    Chronic complications Relationship between chronic complications and blood glucose control;Identified and discussed with patient  current chronic complications    Diabetes Stress and Support Identified and addressed patients feelings and concerns about diabetes;Role of stress on diabetes;Worked with patient to identify barriers to care and solutions    Lifestyle and Health Coping Lifestyle issues that need to be addressed for better diabetes care      Individualized Goals (developed by patient)   Nutrition General guidelines for healthy choices and portions discussed    Physical Activity Exercise 3-5 times per week;30 minutes per day    Medications take my medication as prescribed    Monitoring  Test my blood glucose as discussed    Problem Solving Eating Pattern    Reducing Risk examine blood glucose patterns;do foot checks daily;treat hypoglycemia with 15 grams of carbs if blood glucose less than 70mg /dL    Health Coping Ask for help with  psychological, social, or emotional issues      Post-Education Assessment   Patient understands the diabetes disease and treatment process. Comprehends key points    Patient understands incorporating nutritional management into lifestyle. Comprehends key points    Patient undertands incorporating physical activity into lifestyle. Comprehends key points    Patient understands using medications safely. Comphrehends key points    Patient understands monitoring blood glucose, interpreting and using results Comprehends key points    Patient understands prevention, detection, and treatment of acute complications. Comprehends key points    Patient understands prevention, detection, and treatment of chronic complications. Comprehends key points    Patient understands how to develop strategies to address psychosocial issues. Comprehends key points    Patient understands how to develop strategies to promote health/change behavior. Comprehends key  points      Outcomes   Expected Outcomes Demonstrated interest in learning. Expect positive outcomes    Future DMSE 3-4 months    Program Status Not Completed             Individualized Plan for Diabetes Self-Management Training:   Learning Objective:  Patient will have a greater understanding of diabetes self-management. Patient education plan is to attend individual and/or group sessions per assessed needs and concerns.   Plan:   Patient Instructions  Goal: Get a [employer] membership and exercise for 30 minutes 4 times a week.   Goal: try whole wheat bread at least once.   Goal: When snacking, aim to include a carb + protein. (See snack sheet).  Expected Outcomes:  Demonstrated interest in learning. Expect positive outcomes  Education material provided: ADA - How to Thrive: A Guide for Your Journey with Diabetes, My Plate, Snack sheet, and Support group flyer  If problems or questions, patient to contact team via:  Phone  Future DSME appointment: 3-4 months

## 2023-01-15 NOTE — Patient Instructions (Signed)
Goal: Get a [employer] membership and exercise for 30 minutes 4 times a week.   Goal: try whole wheat bread at least once.   Goal: When snacking, aim to include a carb + protein. (See snack sheet).

## 2023-02-05 ENCOUNTER — Ambulatory Visit: Payer: PRIVATE HEALTH INSURANCE | Admitting: Internal Medicine

## 2023-02-19 ENCOUNTER — Other Ambulatory Visit: Payer: Self-pay | Admitting: Internal Medicine

## 2023-02-19 DIAGNOSIS — I152 Hypertension secondary to endocrine disorders: Secondary | ICD-10-CM

## 2023-02-25 ENCOUNTER — Other Ambulatory Visit (HOSPITAL_COMMUNITY): Payer: Self-pay

## 2023-02-25 MED ORDER — STUDY - OCEANIC-STROKE - ASUNDEXIAN 50 MG OR PLACEBO TABLET (PI-SETHI): 1.0000 | ORAL_TABLET | Freq: Every day | ORAL | 0 refills | Status: DC

## 2023-02-27 ENCOUNTER — Other Ambulatory Visit (HOSPITAL_COMMUNITY): Payer: Self-pay

## 2023-03-06 ENCOUNTER — Other Ambulatory Visit: Payer: Self-pay | Admitting: Internal Medicine

## 2023-03-06 DIAGNOSIS — I5042 Chronic combined systolic (congestive) and diastolic (congestive) heart failure: Secondary | ICD-10-CM

## 2023-03-07 ENCOUNTER — Ambulatory Visit: Payer: PRIVATE HEALTH INSURANCE | Attending: Internal Medicine | Admitting: Internal Medicine

## 2023-03-07 ENCOUNTER — Other Ambulatory Visit (HOSPITAL_COMMUNITY)
Admission: RE | Admit: 2023-03-07 | Discharge: 2023-03-07 | Disposition: A | Discharge status: Home / Self Care | Payer: PRIVATE HEALTH INSURANCE | Source: Ambulatory Visit | Attending: Internal Medicine | Admitting: Internal Medicine

## 2023-03-07 VITALS — BP 146/84 | HR 84 | Temp 98.3°F | Wt 209.0 lb

## 2023-03-07 DIAGNOSIS — R413 Other amnesia: Secondary | ICD-10-CM

## 2023-03-07 DIAGNOSIS — Z7984 Long term (current) use of oral hypoglycemic drugs: Secondary | ICD-10-CM

## 2023-03-07 DIAGNOSIS — N898 Other specified noninflammatory disorders of vagina: Secondary | ICD-10-CM

## 2023-03-07 DIAGNOSIS — E1169 Type 2 diabetes mellitus with other specified complication: Secondary | ICD-10-CM

## 2023-03-07 DIAGNOSIS — Z7985 Long-term (current) use of injectable non-insulin antidiabetic drugs: Secondary | ICD-10-CM

## 2023-03-07 DIAGNOSIS — Z8673 Personal history of transient ischemic attack (TIA), and cerebral infarction without residual deficits: Secondary | ICD-10-CM

## 2023-03-07 DIAGNOSIS — E669 Obesity, unspecified: Secondary | ICD-10-CM

## 2023-03-07 DIAGNOSIS — E1159 Type 2 diabetes mellitus with other circulatory complications: Secondary | ICD-10-CM

## 2023-03-07 DIAGNOSIS — F411 Generalized anxiety disorder: Secondary | ICD-10-CM

## 2023-03-07 DIAGNOSIS — G4489 Other headache syndrome: Secondary | ICD-10-CM

## 2023-03-07 DIAGNOSIS — I5042 Chronic combined systolic (congestive) and diastolic (congestive) heart failure: Secondary | ICD-10-CM

## 2023-03-07 DIAGNOSIS — I10 Essential (primary) hypertension: Secondary | ICD-10-CM

## 2023-03-07 DIAGNOSIS — Z6841 Body Mass Index (BMI) 40.0 and over, adult: Secondary | ICD-10-CM

## 2023-03-07 DIAGNOSIS — E66811 Obesity, class 1: Secondary | ICD-10-CM

## 2023-03-07 LAB — POCT GLYCOSYLATED HEMOGLOBIN (HGB A1C): HbA1c, POC (controlled diabetic range): 7.6 % — AB (ref 0.0–7.0)

## 2023-03-07 LAB — GLUCOSE, POCT (MANUAL RESULT ENTRY): POC Glucose: 93 mg/dL (ref 70–99)

## 2023-03-07 MED ORDER — EZETIMIBE 10 MG PO TABS: 10.0000 mg | ORAL_TABLET | Freq: Every day | ORAL | 2 refills | Status: DC

## 2023-03-07 MED ORDER — BUSPIRONE HCL 5 MG PO TABS: 5.0000 mg | ORAL_TABLET | Freq: Two times a day (BID) | ORAL | 0 refills | Status: DC

## 2023-03-07 MED ORDER — HYDRALAZINE HCL 50 MG PO TABS: 75.0000 mg | ORAL_TABLET | Freq: Three times a day (TID) | ORAL | 6 refills | Status: AC

## 2023-03-07 NOTE — Progress Notes (Signed)
Patient ID: [name], female    DOB: 14-Oct-1965  MRN: [id number]  CC: Diabetes (DM f/u. Med refills. [place] frequent headaches, forgetfulness - requesting referral [facility] & neurology  /Vaginal discharge X2 weeks/Already received flu vax)   Subjective: [name] is a 58 y.o. female who presents for chronic ds management. 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   Discussed the use of AI scribe software for clinical note transcription with the patient, who gave verbal consent to proceed.  History of Present Illness   HA:  pt c/o headaches at the back of the head that occurs about once a wk x several mths. The headaches are described as a pressure sensation at the back of the head.  No associated nausea, vomiting, blurry vision. Associated with anxiety and usually occur while at work. The headaches are relieved by laying down in a quiet room and over-the-counter analgesics such as Tylenol or Aleve. Use to have headaches in the past associated with menses but she no longer menstruates. The patient also reports a sharp pain on the right side of the neck, which is not related to the headaches.  Anxiety: In addition to the headaches, the patient has been experiencing anxiety, particularly since her last visit. The anxiety seems to be triggered by the headaches and concerns about her health, particularly the possibility of another stroke. The patient has been using puzzles and solitude as coping mechanisms for the anxiety. She has completed the 3 mths of DAPT and now just on ASA.   Vaginal Dischg:  The patient also reports a white vaginal discharge that has been present for a couple of weeks. There is no associated itching, burning, or  other symptoms.  Lastly, the patient mentions forgetfulness, which has been noticeable since her last visit. This has affected her ability to remember certain tasks, such as turning on the correct stove eye. She came to our office yesterday thinking she had an appointment even though she had looked at the date and time of her appt earlier this wk. Hind sight, she feels her memory has not been good for several yrs but feels it is worse since CVA in fall of last yr.   HTN/CHF:  Should be on carvedilol 25 mg twice a day, hydralazine 50 mg 3 times a day, isosorbide 30 mg daily, furosemide 40 mg 1/2 daily and spironolactone 25 mg daily. Reports she has taken morning meds and 2nd dose of Hydralazine already for today. No SOB, LE edema, PND  DM:  Results for orders placed or performed in visit on 03/07/23  POCT glucose (manual entry)   Collection Time: 03/07/23  3:45 PM  Result Value Ref Range   POC Glucose 93 70 - 99 mg/dl  POCT glycosylated hemoglobin (Hb A1C)   Collection Time: 03/07/23  3:50 PM  Result Value Ref Range   Hemoglobin A1C     HbA1c POC (<> result, manual entry)     HbA1c, POC (prediabetic range)     HbA1c, POC (controlled diabetic range) 7.6 (A) 0.0 - 7.0 %  A1c improved from last visit when it was 9.6.  Jardiance 10 mg was added on last visit.  She did not get the prescription and is tolerating it.  Reports compliance with Trulicity 1.5 mg  once a week, Lantus 40 units daily and metformin grams once a day.      Patient Active Problem List   Diagnosis Date Noted   New cerebellar infarct (HCC) 12/11/2022   Chronic HFrEF (heart failure with reduced ejection fraction) (HCC) 12/11/2022   Insulin dependent type 2 diabetes mellitus (HCC) 12/11/2022   NPDR (nonproliferative diabetic retinopathy) (HCC) 10/04/2021   Acute cough 04/03/2021   Upper respiratory tract infection 04/03/2021   Anxiety and depression 01/17/2021   Sinus tachycardia 10/15/2018   Vitamin D deficiency 03/26/2018    Homeless 03/24/2018   Microalbuminuria 08/11/2017   Anemia 08/08/2017   Physical deconditioning    Hyperlipidemia 02/21/2017   Diabetic polyneuropathy associated with type 2 diabetes mellitus (HCC) 02/21/2017   Lung nodules 07/16/2016   Uncontrolled type 2 diabetes mellitus without complication, with long-term current use of insulin 07/16/2016   DCM (dilated cardiomyopathy) (HCC)    Hypertension associated with diabetes (HCC) 06/10/2016   Dyslipidemia associated with type 2 diabetes mellitus (HCC) 06/10/2016     Current Outpatient Medications on File Prior to Visit  Medication Sig Dispense Refill   albuterol (VENTOLIN HFA) 108 (90 Base) MCG/ACT inhaler Inhale 2 puffs into the lungs every 6 (six) hours as needed for wheezing or shortness of breath. 18 g 6   aspirin 81 MG chewable tablet Chew 1 tablet (81 mg total) by mouth daily. 30 tablet 3   carvedilol (COREG) 25 MG tablet Take 25 mg by mouth 2 (two) times daily.     Continuous Blood Gluc Transmit (DEXCOM G6 TRANSMITTER) MISC 1 packet by Does not apply route daily. 1 each 4   Dulaglutide (TRULICITY) 1.5 MG/0.5ML SOPN Inject 1.5 mg into the skin once a week. (Patient taking differently: Inject 1.5 mg into the skin once a week. Tuesdays.) 2 mL 6   empagliflozin (JARDIANCE) 10 MG TABS tablet Take 1 tablet (10 mg total) by mouth daily before breakfast. 30 tablet 4   furosemide (LASIX) 40 MG tablet TAKE 1 TABLET BY MOUTH EVERY DAY 90 tablet 0   gabapentin (NEURONTIN) 100 MG capsule Take 2 capsules (200 mg total) by mouth at bedtime. For diabetic neuropathy symptoms 180 capsule 2   isosorbide mononitrate (IMDUR) 30 MG 24 hr tablet Take 1 tablet (30 mg total) by mouth daily.     LANTUS SOLOSTAR 100 UNIT/ML Solostar Pen Inject 40 Units into the skin daily. 45 mL 6   metFORMIN (GLUCOPHAGE-XR) 500 MG 24 hr tablet Take 1 tablet (500 mg total) by mouth daily with breakfast.     pantoprazole (PROTONIX) 40 MG tablet Take 1 tablet (40 mg total) by  mouth daily. 60 tablet 1   rosuvastatin (CRESTOR) 40 MG tablet Take 1 tablet (40 mg total) by mouth daily. 90 tablet 1   spironolactone (ALDACTONE) 25 MG tablet TAKE 1 TABLET(25 MG) BY MOUTH DAILY 90 tablet 0   Study - OCEANIC-STROKE - asundexian 50 mg or placebo tablet (PI-Sethi) Take 1 tablet (50 mg total) by mouth daily. For Investigational Only. Take 1 tablet by mouth once daily at the same time each day, preferably in the morning. Please bring bottle to every visit. 98 tablet 0   Vitamin D, Ergocalciferol, (DRISDOL) 1.25 MG (50000 UNIT) CAPS capsule Take 1 capsule (50,000 Units total) by mouth every 7 (seven) days. 12 capsule 1   No current facility-administered medications on file prior to visit.    Allergies  Allergen Reactions   Lisinopril Hives and Swelling    Facial  Sertraline Hives and Swelling    Facial    Tramadol Hives and Swelling    facial   Ibuprofen Hives and Swelling    Social History   Socioeconomic History   Marital status: Single    Spouse name: Not on file   Number of children: 2   Years of education: Not on file   Highest education level: Associate degree: academic program  Occupational History   Not on file  Tobacco Use   Smoking status: Never   Smokeless tobacco: Never  Vaping Use   Vaping status: Never Used  Substance and Sexual Activity   Alcohol use: No   Drug use: No   Sexual activity: Yes    Birth control/protection: None  Other Topics Concern   Not on file  Social History Narrative   Lives in [place].  Presently unemployed but attends school full time   Social Drivers of Corporate investment banker Strain: Low Risk  (12/24/2022)   Overall Financial Resource Strain (CARDIA)    Difficulty of Paying Living Expenses: Not hard at all  Food Insecurity: No Food Insecurity (01/15/2023)   Hunger Vital Sign    Worried About Running Out of Food in the Last Year: Never true    Ran Out of Food in the Last Year: Never true  Transportation  Needs: No Transportation Needs (12/24/2022)   PRAPARE - Administrator, Civil Service (Medical): No    Lack of Transportation (Non-Medical): No  Physical Activity: Insufficiently Active (12/24/2022)   Exercise Vital Sign    Days of Exercise per Week: 3 days    Minutes of Exercise per Session: 30 min  Stress: No Stress Concern Present (12/24/2022)   [employer] of Occupational Health - Occupational Stress Questionnaire    Feeling of Stress : Not at all  Social Connections: Socially Integrated (12/24/2022)   Social Connection and Isolation Panel [NHANES]    Frequency of Communication with Friends and Family: Twice a week    Frequency of Social Gatherings with Friends and Family: Twice a week    Attends Religious Services: More than 4 times per year    Active Member of [employer] or Organizations: Yes    Attends Engineer, structural: More than 4 times per year    Marital Status: Married  Catering manager Violence: Not At Risk (12/11/2022)   Humiliation, Afraid, Rape, and Kick questionnaire    Fear of Current or Ex-Partner: No    Emotionally Abused: No    Physically Abused: No    Sexually Abused: No    Family History  Problem Relation Age of Onset   Diabetes Other        multiple   Heart failure Paternal Grandmother    Breast cancer Paternal Grandmother    Heart disease Other        multiple   Breast cancer Mother     Past Surgical History:  Procedure Laterality Date   BREAST BIOPSY Left 05/24/2022   MM LT BREAST BX W LOC DEV 1ST LESION IMAGE BX SPEC STEREO GUIDE 05/24/2022 [facility]   CERVIX LESION DESTRUCTION     DILATION AND CURETTAGE OF UTERUS     LEFT HEART CATHETERIZATION WITH CORONARY ANGIOGRAM N/A 05/21/2011   Procedure: LEFT HEART CATHETERIZATION WITH CORONARY ANGIOGRAM;  Surgeon: Tonny Bollman, MD;  [facility];  Service: Cardiovascular;  Laterality: N/A;   TUBAL LIGATION  1996    ROS: Review of Systems Negative except as  stated  above  PHYSICAL EXAM: BP (!) 146/84   Pulse 84   Temp 98.3 F (36.8 C) (Oral)   Wt 209 lb (94.8 kg)   SpO2 99%   BMI 30.86 kg/m   Physical Exam BP 146/84 General appearance - alert, well appearing, and in no distress Mental status - normal mood, behavior, speech, dress, motor activity, and thought processes Neck - supple, no significant adenopathy Chest - clear to auscultation, no wheezes, rales or rhonchi, symmetric air entry Heart - normal rate, regular rhythm, normal S1, S2, no murmurs, rubs, clicks or gallops Neurological - cranial nerves II through XII intact, motor and sensory grossly normal bilaterally Extremities - peripheral pulses normal, no pedal edema, no clubbing or cyanosis     03/07/2023    3:47 PM 12/24/2022    2:02 PM 10/04/2022    3:56 PM 05/10/2022    3:20 PM  GAD 7 : Generalized Anxiety Score  Nervous, Anxious, on Edge 0 0 0 0  Control/stop worrying 0 0 0 0  Worry too much - different things 0 0 0 0  Trouble relaxing 0 0 0 0  Restless 0 0 0 0  Easily annoyed or irritable 0 0 0 0  Afraid - awful might happen 0 0 0 0  Total GAD 7 Score 0 0 0 0  Anxiety Difficulty Not difficult at all           Latest Ref Rng & Units 12/24/2022    2:50 PM 12/12/2022    4:42 AM 12/11/2022   11:41 AM  CMP  Glucose 70 - 99 mg/dL 578  469  629   BUN 6 - 24 mg/dL 19  15  17    Creatinine 0.57 - 1.00 mg/dL 5.28  4.13  2.44   Sodium 134 - 144 mmol/L 138  136  137   Potassium 3.5 - 5.2 mmol/L 3.9  3.1  3.6   Chloride 96 - 106 mmol/L 98  101  98   CO2 20 - 29 mmol/L 26  26    Calcium 8.7 - 10.2 mg/dL 01.0  8.8     Lipid Panel     Component Value Date/Time   CHOL 187 12/12/2022 0442   CHOL 123 10/04/2022 1636   TRIG 119 12/12/2022 0442   HDL 35 (L) 12/12/2022 0442   HDL 40 10/04/2022 1636   CHOLHDL 5.3 12/12/2022 0442   VLDL 24 12/12/2022 0442   LDLCALC 128 (H) 12/12/2022 0442   LDLCALC 65 10/04/2022 1636    CBC    Component Value Date/Time   WBC 5.0  12/24/2022 1450   WBC 6.7 12/12/2022 0442   RBC 4.91 12/24/2022 1450   RBC 3.96 12/12/2022 0442   HGB 13.5 12/24/2022 1450   HCT 43.0 12/24/2022 1450   PLT 340 12/24/2022 1450   MCV 88 12/24/2022 1450   MCH 27.5 12/24/2022 1450   MCH 27.3 12/12/2022 0442   MCHC 31.4 (L) 12/24/2022 1450   MCHC 31.7 12/12/2022 0442   RDW 13.5 12/24/2022 1450   LYMPHSABS 2.5 07/31/2017 1225   MONOABS 0.6 06/02/2017 0530   EOSABS 0.2 07/31/2017 1225   BASOSABS 0.1 07/31/2017 1225      03/07/2023    5:10 PM 11/28/2018    4:15 PM  MMSE - Mini Mental State Exam  Orientation to time 5 5  Orientation to Place 5 5  Registration 3 3  Attention/ Calculation 5 0  Recall 1 3  Language- name 2 objects 2 2  Language- repeat 1 1  Language- follow 3 step command 3 3  Language- read & follow direction 1 1  Write a sentence 1 1  Copy design 1 1  Total score 28 25    ASSESSMENT AND PLAN: 1. Type 2 diabetes mellitus with obesity (HCC) (Primary) A1c has improved.  Encourage healthy eating habits.  Continue Trulicity 1.5 mg once a week, Lantus insulin 40 units daily, metformin 500.  I would like to see her BUN/creatinine before increasing the Jardiance to 25 mg.  History ordered today. - POCT glycosylated hemoglobin (Hb A1C) - POCT glucose (manual entry)  2. History of cerebrovascular accident (CVA) involving cerebellum Continue aspirin, rosuvastatin 40 mg daily and Zetia (daily - ezetimibe (ZETIA) 10 MG tablet; Take 1 tablet (10 mg total) by mouth daily.  Dispense: 90 tablet; Refill: 2 - Ambulatory referral to Neurology  3. Headache syndrome Started after CVA in October.  Continue Tylenol as needed.  Avoid Advil. - Ambulatory referral to Neurology  4. Generalized anxiety disorder Anxious about her health.  Discussed ways to help decrease anxiety including exercise, listening to music, meditation.  She is agreeable to referral to behavioral health She is willing to try low-dose of BuSpar. - Ambulatory  referral to Psychiatry  5. Memory changes Scored 28 out of 30 on Mini-Mental status exam.  Reports worsening memory changes recent CVA.  Further evaluation. - Ambulatory referral to Neurology - Vitamin B12; Future - busPIRone (BUSPAR) 5 MG tablet; Take 1 tablet (5 mg total) by mouth 2 (two) times daily.  Dispense: 60 tablet; Refill: 0  6. Hypertension associated with diabetes (HCC) Not at goal.  Increase hydralazine to 75 mg 3 times a day.  Continue other medications which include carvedilol 25 mg twice a day, isosorbide 30 mg daily, furosemide 40 mg half a tablet daily spironolactone grams daily - Basic Metabolic Panel; Future - hydrALAZINE (APRESOLINE) 50 MG tablet; Take 1.5 tablets (75 mg total) by mouth 3 (three) times daily.  Dispense: 135 tablet; Refill: 6  7. Vaginal discharge - Cervicovaginal ancillary only  8. Chronic combined systolic and diastolic CHF, NYHA class 1 (HCC) Stable and compensated.  Continue carvedilol, hydralazine, isosorbide and Jardiance - hydrALAZINE (APRESOLINE) 50 MG tablet; Take 1.5 tablets (75 mg total) by mouth 3 (three) times daily.  Dispense: 135 tablet; Refill: 6  Patient was given the opportunity to ask questions.  Patient verbalized understanding of the plan and was able to repeat key elements of the plan.   This documentation was completed using Paediatric nurse.  Any transcriptional errors are unintentional.  Orders Placed This Encounter  Procedures   Basic Metabolic Panel   Vitamin B12   Ambulatory referral to Neurology   Ambulatory referral to Psychiatry   POCT glycosylated hemoglobin (Hb A1C)   POCT glucose (manual entry)     Requested Prescriptions   Signed Prescriptions Disp Refills   ezetimibe (ZETIA) 10 MG tablet 90 tablet 2    Sig: Take 1 tablet (10 mg total) by mouth daily.   busPIRone (BUSPAR) 5 MG tablet 60 tablet 0    Sig: Take 1 tablet (5 mg total) by mouth 2 (two) times daily.   hydrALAZINE (APRESOLINE)  50 MG tablet 135 tablet 6    Sig: Take 1.5 tablets (75 mg total) by mouth 3 (three) times daily.    Return in about 3 months (around 06/05/2023) for give lab visit for tomorrow or next week.  Jonah Blue, MD, FACP

## 2023-03-07 NOTE — Patient Instructions (Addendum)
Increase hydralazine to 1-1/2 tablets 3 times a day.  This means you will be taking 75 mg 3 times a day. We have started you on a medication called BuSpar 5 mg twice a day to help decrease anxiety.  We have referred you to behavioral health.

## 2023-03-09 ENCOUNTER — Encounter: Payer: Self-pay | Admitting: Internal Medicine

## 2023-03-10 ENCOUNTER — Other Ambulatory Visit: Payer: Self-pay | Admitting: Internal Medicine

## 2023-03-10 LAB — CERVICOVAGINAL ANCILLARY ONLY
Bacterial Vaginitis (gardnerella): POSITIVE — AB
Candida Glabrata: NEGATIVE
Candida Vaginitis: NEGATIVE
Chlamydia: NEGATIVE
Comment: NEGATIVE
Comment: NEGATIVE
Comment: NEGATIVE
Comment: NEGATIVE
Comment: NEGATIVE
Comment: NORMAL
Neisseria Gonorrhea: NEGATIVE
Trichomonas: NEGATIVE

## 2023-03-10 MED ORDER — METRONIDAZOLE 500 MG PO TABS: 500.0000 mg | ORAL_TABLET | Freq: Two times a day (BID) | ORAL | 0 refills | Status: DC

## 2023-03-12 ENCOUNTER — Ambulatory Visit: Payer: PRIVATE HEALTH INSURANCE | Attending: Internal Medicine

## 2023-03-12 DIAGNOSIS — E1159 Type 2 diabetes mellitus with other circulatory complications: Secondary | ICD-10-CM

## 2023-03-12 DIAGNOSIS — R413 Other amnesia: Secondary | ICD-10-CM

## 2023-03-13 LAB — BASIC METABOLIC PANEL
BUN/Creatinine Ratio: 20 (ref 9–23)
BUN: 21 mg/dL (ref 6–24)
CO2: 23 mmol/L (ref 20–29)
Calcium: 9.5 mg/dL (ref 8.7–10.2)
Chloride: 103 mmol/L (ref 96–106)
Creatinine, Ser: 1.05 mg/dL — ABNORMAL HIGH (ref 0.57–1.00)
Glucose: 163 mg/dL — ABNORMAL HIGH (ref 70–99)
Potassium: 3.8 mmol/L (ref 3.5–5.2)
Sodium: 141 mmol/L (ref 134–144)
eGFR: 63 mL/min/{1.73_m2} (ref >59)

## 2023-03-13 LAB — VITAMIN B12: Vitamin B-12: 466 pg/mL (ref 232–1245)

## 2023-03-15 ENCOUNTER — Telehealth: Payer: Self-pay

## 2023-03-15 ENCOUNTER — Encounter: Payer: Self-pay | Admitting: Neurology

## 2023-03-15 NOTE — Telephone Encounter (Signed)
-----   Message from Jonah Blue sent at 03/13/2023  8:18 AM EST ----- Let patient know that her kidney function is not at 100% but improved.  Potassium level normal.  Vitamin B12 level within normal range.

## 2023-03-15 NOTE — Telephone Encounter (Signed)
 Pt was called and is aware of results, DOB was confirmed.  ?

## 2023-03-15 NOTE — Telephone Encounter (Signed)
Yes it is okay to do so.

## 2023-03-15 NOTE — Telephone Encounter (Signed)
 Called patient and left voicemail.

## 2023-03-18 ENCOUNTER — Other Ambulatory Visit (HOSPITAL_COMMUNITY): Payer: Self-pay | Admitting: Pharmacist

## 2023-03-18 MED ORDER — STUDY - OCEANIC-STROKE - ASUNDEXIAN 50 MG OR PLACEBO TABLET (PI-SETHI): 1.0000 | ORAL_TABLET | Freq: Every day | ORAL | 0 refills | Status: DC

## 2023-04-14 ENCOUNTER — Other Ambulatory Visit: Payer: Self-pay | Admitting: Internal Medicine

## 2023-04-14 DIAGNOSIS — I152 Hypertension secondary to endocrine disorders: Secondary | ICD-10-CM

## 2023-04-14 DIAGNOSIS — I5042 Chronic combined systolic (congestive) and diastolic (congestive) heart failure: Secondary | ICD-10-CM

## 2023-04-15 NOTE — Telephone Encounter (Signed)
 Requested Prescriptions  Refused Prescriptions Disp Refills   hydrALAZINE (APRESOLINE) 50 MG tablet [Pharmacy Med Name: HYDRALAZINE  50MG  TABLETS(ORANGE)] 270 tablet     Sig: TAKE 1 TABLET(50 MG) BY MOUTH THREE TIMES DAILY     Cardiovascular:  Vasodilators Failed - 04/15/2023  5:55 PM      Failed - ANA Screen, Ifa, Serum in normal range and within 360 days    No results found for: "ANA", "ANATITER", "LABANTI"       Failed - Last BP in normal range    BP Readings from Last 1 Encounters:  03/07/23 (!) 146/84         Passed - HCT in normal range and within 360 days    Hematocrit  Date Value Ref Range Status  12/24/2022 43.0 34.0 - 46.6 % Final         Passed - HGB in normal range and within 360 days    Hemoglobin  Date Value Ref Range Status  12/24/2022 13.5 11.1 - 15.9 g/dL Final   Total hemoglobin  Date Value Ref Range Status  02/23/2015 13.5 12.0 - 16.0 g/dL Final         Passed - RBC in normal range and within 360 days    RBC  Date Value Ref Range Status  12/24/2022 4.91 3.77 - 5.28 x10E6/uL Final  12/12/2022 3.96 3.87 - 5.11 MIL/uL Final         Passed - WBC in normal range and within 360 days    WBC  Date Value Ref Range Status  12/24/2022 5.0 3.4 - 10.8 x10E3/uL Final  12/12/2022 6.7 4.0 - 10.5 K/uL Final         Passed - PLT in normal range and within 360 days    Platelets  Date Value Ref Range Status  12/24/2022 340 150 - 450 x10E3/uL Final         Passed - Valid encounter within last 12 months    Recent Outpatient Visits           1 month ago Type 2 diabetes mellitus with obesity (HCC)   [facility] [place]. [facility] Marcine Matar, MD   3 months ago Hospital discharge follow-up   [facility] - A Dept Of [place]. [facility] Jonah Blue B, MD   6 months ago Type 2 diabetes mellitus with obesity [facility])   [facility] - [facility] Dept Of [place]. [facility] Marcine Matar, MD   10 months ago Left ear pain   [facility] [place] J, NP   11 months ago Pap smear for cervical cancer screening   [facility] - A Dept Of [place]. [facility] Marcine Matar, MD       Future Appointments             In 3 weeks Loleta Chance, Manus Gunning, MD [facility] Neurology   In 1 month Laural Benes Binnie Rail, MD [facility] [place] - A Dept Of [place] [place]. [facility]

## 2023-04-30 ENCOUNTER — Encounter: Payer: Self-pay | Admitting: Dietician

## 2023-04-30 ENCOUNTER — Encounter: Payer: PRIVATE HEALTH INSURANCE | Attending: Internal Medicine | Admitting: Dietician

## 2023-04-30 VITALS — Wt 205.0 lb

## 2023-04-30 DIAGNOSIS — E119 Type 2 diabetes mellitus without complications: Secondary | ICD-10-CM | POA: Diagnosis present

## 2023-04-30 DIAGNOSIS — Z794 Long term (current) use of insulin: Secondary | ICD-10-CM | POA: Diagnosis present

## 2023-04-30 NOTE — Progress Notes (Signed)
 Diabetes Self-Management Education  Visit Type: Follow-up  Appt. Start Time: 1600 Appt. End Time: 1630  04/30/2023  [name], identified by name and date of birth, is a 58 y.o. female with a diagnosis of Diabetes: type 2 .   ASSESSMENT  History includes: type 2 diabetes, asthma, cataracts, CHF, HTN, stroke Labs noted: 12/12/22: A1c 8.3%, 12/24/22: eGFR 51, vitamin D 14. Medications include: trulicity, jardiance, lantus, metformin Supplements: vitamin D  Wt Readings from Last 3 Encounters:  04/30/23 205 lb (93 kg)  03/07/23 209 lb (94.8 kg)  01/15/23 209 lb (94.8 kg)   Pt A1c down from 8.3 on 12/12/22 to 7.6 on 03/07/23. Pt weight down 4 lb since previous visit.   Pt states she feels a lot better since moving and now has an apartment alone. Pt reports she has been working on setting boundaries for herself which has reduced stress.   Pt states she has been walking at the park 3 days per week for 30-60 minutes. Pt states she has cut back on portion sizes. Pt reports eating in general has reduces and she states she thinks she was stress eating more in the past.   Pt states she switch to zero sugar sodas, and reduced cran-grape juice intake. Pt states she asks for zero sugar drinks at restaurants.  New Goal:   Incorporate a source of protein at meals and snacks.   Assessment of Previous Goals:  Goal: Get a [employer] membership and exercise for 30 minutes 4 times a week. - met exercise goal through walking, prefers it over gym.    Goal: try whole wheat bread at least once. - goal met, liked it.    Goal: When snacking, aim to include a carb + protein. (See snack sheet).  Weight 205 lb (93 kg). Body mass index is 30.27 kg/m.   Diabetes Self-Management Education - 04/30/23 1556       Visit Information   Visit Type Follow-up      Health Coping   How would you rate your overall health? Good      Psychosocial Assessment   Patient Belief/Attitude about  Diabetes Motivated to manage diabetes    What is the hardest part about your diabetes right now, causing you the most concern, or is the most worrisome to you about your diabetes?   Making healty food and beverage choices    Self-care barriers None    Self-management support Doctor's office    Other persons present Patient    Patient Concerns Nutrition/Meal planning    Special Needs None    Preferred Learning Style No preference indicated    Learning Readiness Ready      Pre-Education Assessment   Patient understands the diabetes disease and treatment process. Needs Review    Patient understands incorporating nutritional management into lifestyle. Needs Review    Patient undertands incorporating physical activity into lifestyle. Needs Review    Patient understands using medications safely. Needs Review    Patient understands monitoring blood glucose, interpreting and using results Needs Review    Patient understands prevention, detection, and treatment of acute complications. Needs Review    Patient understands prevention, detection, and treatment of chronic complications. Needs Review    Patient understands how to develop strategies to address psychosocial issues. Needs Review    Patient understands how to develop strategies to promote health/change behavior. Needs Review      Complications   Last HgB A1C per patient/outside source 7.6 %  Dietary Intake   Breakfast cheerios and frozen berries OR grits OR grits and egg    Snack (morning) chips    Lunch cheese and crackers OR peanut butter and crackers OR sandwich    Snack (afternoon) popcorn OR fruit    Dinner cereal OR salad and chicken alfredo    Snack (evening) none    Beverage(s) 1 zero sugar soda,      Activity / Exercise   Activity / Exercise Type Light (walking / raking leaves)    How many days per week do you exercise? 3    How many minutes per day do you exercise? 60    Total minutes per week of exercise 180       Patient Education   Previous Diabetes Education Yes (please comment)    Disease Pathophysiology Explored patient's options for treatment of their diabetes    Healthy Eating Role of diet in the treatment of diabetes and the relationship between the three main macronutrients and blood glucose level;Plate Method;Information on hints to eating out and maintain blood glucose control.;Meal timing in regards to the patients' current diabetes medication.    Being Active Role of exercise on diabetes management, blood pressure control and cardiac health.    Medications Reviewed patients medication for diabetes, action, purpose, timing of dose and side effects.    Monitoring Identified appropriate SMBG and/or A1C goals.    Chronic complications Relationship between chronic complications and blood glucose control;Identified and discussed with patient  current chronic complications    Diabetes Stress and Support Identified and addressed patients feelings and concerns about diabetes;Role of stress on diabetes    Lifestyle and Health Coping Lifestyle issues that need to be addressed for better diabetes care      Individualized Goals (developed by patient)   Nutrition General guidelines for healthy choices and portions discussed    Physical Activity Exercise 3-5 times per week;60 minutes per day    Medications take my medication as prescribed    Monitoring  Test my blood glucose as discussed    Problem Solving Eating Pattern    Reducing Risk examine blood glucose patterns;do foot checks daily;treat hypoglycemia with 15 grams of carbs if blood glucose less than 70mg /dL    Health Coping Ask for help with psychological, social, or emotional issues      Patient Self-Evaluation of Goals - Patient rates self as meeting previously set goals (% of time)   Nutrition 50 - 75 % (half of the time)    Physical Activity >75% (most of the time)    Medications >75% (most of the time)    Monitoring 50 - 75 % (half of the  time)    Problem Solving and behavior change strategies  50 - 75 % (half of the time)    Reducing Risk (treating acute and chronic complications) 50 - 75 % (half of the time)    Health Coping 50 - 75 % (half of the time)      Post-Education Assessment   Patient understands the diabetes disease and treatment process. Comprehends key points    Patient understands incorporating nutritional management into lifestyle. Comprehends key points    Patient undertands incorporating physical activity into lifestyle. Demonstrates understanding / competency    Patient understands using medications safely. Demonstrates understanding / competency    Patient understands monitoring blood glucose, interpreting and using results Comprehends key points    Patient understands prevention, detection, and treatment of acute complications. Comprehends key points  Patient understands prevention, detection, and treatment of chronic complications. Comprehends key points    Patient understands how to develop strategies to address psychosocial issues. Comprehends key points    Patient understands how to develop strategies to promote health/change behavior. Comprehends key points      Outcomes   Expected Outcomes Demonstrated interest in learning. Expect positive outcomes    Future DMSE PRN    Program Status Completed      Subsequent Visit   Since your last visit have you continued or begun to take your medications as prescribed? Yes             Individualized Plan for Diabetes Self-Management Training:   Learning Objective:  Patient will have a greater understanding of diabetes self-management. Patient education plan is to attend individual and/or group sessions per assessed needs and concerns.   Plan:   There are no Patient Instructions on file for this visit.  Expected Outcomes:  Demonstrated interest in learning. Expect positive outcomes  Education material provided: My Plate and Support group  flyer  If problems or questions, patient to contact team via:  Phone  Future DSME appointment: PRN

## 2023-05-08 ENCOUNTER — Ambulatory Visit: Payer: PRIVATE HEALTH INSURANCE | Admitting: Neurology

## 2023-05-08 ENCOUNTER — Encounter: Payer: Self-pay | Admitting: Neurology

## 2023-05-21 ENCOUNTER — Other Ambulatory Visit: Payer: Self-pay | Admitting: Internal Medicine

## 2023-05-21 DIAGNOSIS — E1169 Type 2 diabetes mellitus with other specified complication: Secondary | ICD-10-CM

## 2023-05-27 ENCOUNTER — Other Ambulatory Visit: Payer: Self-pay | Admitting: Internal Medicine

## 2023-05-27 DIAGNOSIS — E1169 Type 2 diabetes mellitus with other specified complication: Secondary | ICD-10-CM

## 2023-05-27 DIAGNOSIS — I5042 Chronic combined systolic (congestive) and diastolic (congestive) heart failure: Secondary | ICD-10-CM

## 2023-05-27 DIAGNOSIS — E1159 Type 2 diabetes mellitus with other circulatory complications: Secondary | ICD-10-CM

## 2023-05-28 ENCOUNTER — Other Ambulatory Visit: Payer: Self-pay | Admitting: Internal Medicine

## 2023-05-28 DIAGNOSIS — E1159 Type 2 diabetes mellitus with other circulatory complications: Secondary | ICD-10-CM

## 2023-05-29 ENCOUNTER — Ambulatory Visit: Payer: PRIVATE HEALTH INSURANCE | Admitting: Neurology

## 2023-06-06 ENCOUNTER — Encounter: Payer: Self-pay | Admitting: Internal Medicine

## 2023-06-06 ENCOUNTER — Ambulatory Visit: Payer: PRIVATE HEALTH INSURANCE | Attending: Internal Medicine | Admitting: Internal Medicine

## 2023-06-06 VITALS — BP 117/68 | HR 80 | Ht 69.0 in | Wt 201.0 lb

## 2023-06-06 DIAGNOSIS — E1169 Type 2 diabetes mellitus with other specified complication: Secondary | ICD-10-CM | POA: Diagnosis not present

## 2023-06-06 DIAGNOSIS — Z794 Long term (current) use of insulin: Secondary | ICD-10-CM | POA: Diagnosis not present

## 2023-06-06 DIAGNOSIS — Z7985 Long-term (current) use of injectable non-insulin antidiabetic drugs: Secondary | ICD-10-CM | POA: Diagnosis not present

## 2023-06-06 DIAGNOSIS — N898 Other specified noninflammatory disorders of vagina: Secondary | ICD-10-CM

## 2023-06-06 DIAGNOSIS — E1159 Type 2 diabetes mellitus with other circulatory complications: Secondary | ICD-10-CM | POA: Diagnosis not present

## 2023-06-06 DIAGNOSIS — E119 Type 2 diabetes mellitus without complications: Secondary | ICD-10-CM | POA: Diagnosis not present

## 2023-06-06 DIAGNOSIS — I5042 Chronic combined systolic (congestive) and diastolic (congestive) heart failure: Secondary | ICD-10-CM

## 2023-06-06 DIAGNOSIS — Z1211 Encounter for screening for malignant neoplasm of colon: Secondary | ICD-10-CM

## 2023-06-06 DIAGNOSIS — Z7984 Long term (current) use of oral hypoglycemic drugs: Secondary | ICD-10-CM

## 2023-06-06 DIAGNOSIS — I152 Hypertension secondary to endocrine disorders: Secondary | ICD-10-CM

## 2023-06-06 LAB — POCT GLYCOSYLATED HEMOGLOBIN (HGB A1C): HbA1c, POC (controlled diabetic range): 6.7 % (ref 0.0–7.0)

## 2023-06-06 LAB — GLUCOSE, POCT (MANUAL RESULT ENTRY): POC Glucose: 72 mg/dL (ref 70–99)

## 2023-06-06 MED ORDER — EMPAGLIFLOZIN 10 MG PO TABS: 10.0000 mg | ORAL_TABLET | Freq: Every day | ORAL | 1 refills | Status: DC

## 2023-06-06 NOTE — Patient Instructions (Signed)
 Purchase and use the action of moisturizer called Replens from over-the-counter.

## 2023-06-06 NOTE — Progress Notes (Signed)
 Patient ID: [name], female    DOB: [birth year]  MRN: [id number]  CC: Diabetes (DM f/u. Med refill. /Experiencing vaginal dryness /)   Subjective: [name] is a 58 y.o. female who presents for chronic ds management. 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   Discussed the use of AI scribe software for clinical note transcription with the patient, who gave verbal consent to proceed.  History of Present Illness   The patient, with a history of diabetes, hypertension, and congestive heart failure, presents for a routine follow-up.  DM Results for orders placed or performed in visit on 06/06/23  POCT glucose (manual entry)   Collection Time: 06/06/23  3:15 PM  Result Value Ref Range   POC Glucose 72 70 - 99 mg/dl  POCT glycosylated hemoglobin (Hb A1C)   Collection Time: 06/06/23  3:57 PM  Result Value Ref Range   Hemoglobin A1C     HbA1c POC (<> result, manual entry)     HbA1c, POC (prediabetic range)     HbA1c, POC (controlled diabetic range) 6.7 0.0 - 7.0 %   She reports adherence to her current medication regimen, including Jardiance  10mg , Metformin  500mg , Lantus  insulin  40 units, and Trulicity  1.5mg . She notes a significant improvement in her A1c, from 7.6 to 6.7, and attributes this to dietary changes, including increased fruit and vegetable intake and reduced sugar intake. She also reports a weight loss of 8 pounds since her last visit three months ago, which she attributes to the appetite-suppressing effects of Trulicity  and regular walking exercise.  She walks in the park 3 days a week for 30 to 40 minutes.  The patient also reports vaginal dryness, which she attributes to menopause. She has not yet sought  treatment for this symptom.   Referred to neurology on last visit for poor memory.  Appointment is scheduled for next month with Dr. Genita Keys.   She was previously referred to behavioral health for anxiety but has not yet had an appointment. She has been prescribed Buspar  for anxiety but has not needed to take it.  She feels that she is doing okay at this time and no longer needs the medication or referral to behavioral health.  HTN/CHF/NICM:  The patient also has a history of congestive heart failure and HTN, which appear to be well-managed with her current medication regimen, including  hydralazine , carvedilol , isosorbide , spironolactone , and furosemide . She reports no swelling in the legs or shortness of breath.         Patient Active Problem List   Diagnosis Date Noted   New cerebellar infarct (HCC) 12/11/2022   Chronic HFrEF (heart failure with reduced ejection fraction) (HCC) 12/11/2022   Insulin  dependent type 2 diabetes mellitus (HCC) 12/11/2022   NPDR (nonproliferative diabetic retinopathy) (HCC) 10/04/2021   Acute cough 04/03/2021   Upper respiratory tract infection 04/03/2021   Anxiety and depression 01/17/2021   Sinus tachycardia 10/15/2018   Vitamin D  deficiency 03/26/2018   Homeless 03/24/2018   Microalbuminuria 08/11/2017   Anemia 08/08/2017   Physical deconditioning    Hyperlipidemia 02/21/2017   Diabetic polyneuropathy associated with type 2 diabetes mellitus (HCC) 02/21/2017   Lung nodules 07/16/2016   Uncontrolled type 2 diabetes mellitus without complication, with long-term current use of insulin  07/16/2016  DCM (dilated cardiomyopathy) (HCC)    Hypertension associated with diabetes (HCC) 06/10/2016   Dyslipidemia associated with type 2 diabetes mellitus (HCC) 06/10/2016     Current Outpatient Medications on File Prior to Visit  Medication Sig Dispense Refill   albuterol  (VENTOLIN  HFA) 108 (90 Base) MCG/ACT inhaler Inhale 2 puffs into the lungs every 6 (six)  hours as needed for wheezing or shortness of breath. 18 g 6   aspirin  81 MG chewable tablet Chew 1 tablet (81 mg total) by mouth daily. 30 tablet 3   busPIRone  (BUSPAR ) 5 MG tablet Take 1 tablet (5 mg total) by mouth 2 (two) times daily. 60 tablet 0   carvedilol  (COREG ) 25 MG tablet TAKE 1 TABLET(25 MG) BY MOUTH TWICE DAILY WITH A MEAL 180 tablet 1   Continuous Blood Gluc Transmit (DEXCOM G6 TRANSMITTER) MISC 1 packet by Does not apply route daily. 1 each 4   Dulaglutide  (TRULICITY ) 1.5 MG/0.5ML SOAJ ADMINISTER 1.5 MG UNDER THE SKIN 1 TIME A WEEK 2 mL 0   ezetimibe  (ZETIA ) 10 MG tablet Take 1 tablet (10 mg total) by mouth daily. 90 tablet 2   furosemide  (LASIX ) 40 MG tablet TAKE 1 TABLET BY MOUTH EVERY DAY 90 tablet 0   gabapentin  (NEURONTIN ) 100 MG capsule Take 2 capsules (200 mg total) by mouth at bedtime. For diabetic neuropathy symptoms 180 capsule 2   hydrALAZINE  (APRESOLINE ) 50 MG tablet Take 1.5 tablets (75 mg total) by mouth 3 (three) times daily. 135 tablet 6   isosorbide  mononitrate (IMDUR ) 30 MG 24 hr tablet TAKE 1 TABLET(30 MG) BY MOUTH DAILY 90 tablet 1   LANTUS  SOLOSTAR 100 UNIT/ML Solostar Pen Inject 40 Units into the skin daily. 45 mL 6   metFORMIN  (GLUCOPHAGE -XR) 500 MG 24 hr tablet TAKE 1 TABLET(500 MG) BY MOUTH DAILY WITH BREAKFAST 90 tablet 1   metroNIDAZOLE  (FLAGYL ) 500 MG tablet Take 1 tablet (500 mg total) by mouth 2 (two) times daily. 14 tablet 0   pantoprazole  (PROTONIX ) 40 MG tablet Take 1 tablet (40 mg total) by mouth daily. 60 tablet 1   rosuvastatin  (CRESTOR ) 40 MG tablet Take 1 tablet (40 mg total) by mouth daily. 90 tablet 1   spironolactone  (ALDACTONE ) 25 MG tablet TAKE 1 TABLET(25 MG) BY MOUTH DAILY 90 tablet 0   Study - OCEANIC-STROKE - asundexian 50 mg or placebo tablet (PI-Sethi) Take 1 tablet (50 mg total) by mouth daily. For Investigational Only. Take 1 tablet by mouth once daily at the same time each day, preferably in the morning. Please bring bottle to every  visit. 98 tablet 0   TRULICITY  0.75 MG/0.5ML SOAJ ADMINISTER 0.75 MG UNDER THE SKIN 1 TIME A WEEK 2 mL 6   Vitamin D , Ergocalciferol , (DRISDOL ) 1.25 MG (50000 UNIT) CAPS capsule Take 1 capsule (50,000 Units total) by mouth every 7 (seven) days. 12 capsule 1   No current facility-administered medications on file prior to visit.    Allergies  Allergen Reactions   Lisinopril  Hives and Swelling    Facial    Sertraline  Hives and Swelling    Facial    Tramadol  Hives and Swelling    facial   Ibuprofen Hives and Swelling    Social History   Socioeconomic History   Marital status: Single    Spouse name: Not on file   Number of children: 2   Years of education: Not on file   Highest education level: Associate degree: academic program  Occupational History   Not on  file  Tobacco Use   Smoking status: Never   Smokeless tobacco: Never  Vaping Use   Vaping status: Never Used  Substance and Sexual Activity   Alcohol use: No   Drug use: No   Sexual activity: Yes    Birth control/protection: None  Other Topics Concern   Not on file  Social History Narrative   Lives in [place].  Presently unemployed but attends school full time   Social Drivers of Health   Financial Resource Strain: Medium Risk (06/06/2023)   Overall Financial Resource Strain (CARDIA)    Difficulty of Paying Living Expenses: Somewhat hard  Food Insecurity: No Food Insecurity (06/06/2023)   Hunger Vital Sign    Worried About Running Out of Food in the Last Year: Never true    Ran Out of Food in the Last Year: Never true  Transportation Needs: No Transportation Needs (06/06/2023)   PRAPARE - Administrator, Civil Service (Medical): No    Lack of Transportation (Non-Medical): No  Physical Activity: Insufficiently Active (06/06/2023)   Exercise Vital Sign    Days of Exercise per Week: 3 days    Minutes of Exercise per Session: 10 min  Stress: No Stress Concern Present (06/06/2023)   [employer]  of Occupational Health - Occupational Stress Questionnaire    Feeling of Stress : Not at all  Social Connections: Moderately Integrated (06/06/2023)   Social Connection and Isolation Panel [NHANES]    Frequency of Communication with Friends and Family: More than three times a week    Frequency of Social Gatherings with Friends and Family: More than three times a week    Attends Religious Services: More than 4 times per year    Active Member of [employer] or Organizations: Yes    Attends Engineer, structural: More than 4 times per year    Marital Status: Divorced  Catering manager Violence: Not At Risk (06/06/2023)   Humiliation, Afraid, Rape, and Kick questionnaire    Fear of Current or Ex-Partner: No    Emotionally Abused: No    Physically Abused: No    Sexually Abused: No    Family History  Problem Relation Age of Onset   Diabetes Other        multiple   Heart failure Paternal Grandmother    Breast cancer Paternal Grandmother    Heart disease Other        multiple   Breast cancer Mother     Past Surgical History:  Procedure Laterality Date   BREAST BIOPSY Left 05/24/2022   MM LT BREAST BX W LOC DEV 1ST LESION IMAGE BX SPEC STEREO GUIDE 05/24/2022 [facility]   CERVIX LESION DESTRUCTION     DILATION AND CURETTAGE OF UTERUS     LEFT HEART CATHETERIZATION WITH CORONARY ANGIOGRAM N/A 05/21/2011   Procedure: LEFT HEART CATHETERIZATION WITH CORONARY ANGIOGRAM;  Surgeon: Arnoldo Lapping, MD;  Location[facility];  Service: Cardiovascular;  Laterality: N/A;   TUBAL LIGATION  1996    ROS: Review of Systems Negative except as stated above  PHYSICAL EXAM: BP 117/68 (BP Location: Left Arm, Patient Position: Sitting, Cuff Size: Normal)   Pulse 80   Ht 5\' 9"  (1.753 m)   Wt 201 lb (91.2 kg)   SpO2 99%   BMI 29.68 kg/m   Wt Readings from Last 3 Encounters:  06/06/23 201 lb (91.2 kg)  04/30/23 205 lb (93 kg)  03/07/23 209 lb (94.8 kg)    Physical Exam  General  appearance - alert, well appearing, and in no distress Mental status - normal mood, behavior, speech, dress, motor activity, and thought processes Chest - clear to auscultation, no wheezes, rales or rhonchi, symmetric air entry Heart - normal rate, regular rhythm, normal S1, S2, no murmurs, rubs, clicks or gallops Extremities - peripheral pulses normal, no pedal edema, no clubbing or cyanosis      Latest Ref Rng & Units 03/12/2023    3:15 PM 12/24/2022    2:50 PM 12/12/2022    4:42 AM  CMP  Glucose 70 - 99 mg/dL 440  347  425   BUN 6 - 24 mg/dL 21  19  15    Creatinine 0.57 - 1.00 mg/dL 9.56  3.87  5.64   Sodium 134 - 144 mmol/L 141  138  136   Potassium 3.5 - 5.2 mmol/L 3.8  3.9  3.1   Chloride 96 - 106 mmol/L 103  98  101   CO2 20 - 29 mmol/L 23  26  26    Calcium  8.7 - 10.2 mg/dL 9.5  33.2  8.8    Lipid Panel     Component Value Date/Time   CHOL 187 12/12/2022 0442   CHOL 123 10/04/2022 1636   TRIG 119 12/12/2022 0442   HDL 35 (L) 12/12/2022 0442   HDL 40 10/04/2022 1636   CHOLHDL 5.3 12/12/2022 0442   VLDL 24 12/12/2022 0442   LDLCALC 128 (H) 12/12/2022 0442   LDLCALC 65 10/04/2022 1636    CBC    Component Value Date/Time   WBC 5.0 12/24/2022 1450   WBC 6.7 12/12/2022 0442   RBC 4.91 12/24/2022 1450   RBC 3.96 12/12/2022 0442   HGB 13.5 12/24/2022 1450   HCT 43.0 12/24/2022 1450   PLT 340 12/24/2022 1450   MCV 88 12/24/2022 1450   MCH 27.5 12/24/2022 1450   MCH 27.3 12/12/2022 0442   MCHC 31.4 (L) 12/24/2022 1450   MCHC 31.7 12/12/2022 0442   RDW 13.5 12/24/2022 1450   LYMPHSABS 2.5 07/31/2017 1225   MONOABS 0.6 06/02/2017 0530   EOSABS 0.2 07/31/2017 1225   BASOSABS 0.1 07/31/2017 1225    ASSESSMENT AND PLAN: 1. Type 2 diabetes mellitus with other specified complication, with long-term current use of insulin  (HCC) (Primary) At goal.  Commended her on this.  Encouraged to continue healthy eating habits and regular exercise.  Continue Trulicity  1.5 mg once  a week, Lantus  40 units daily, metformin  500 mg daily and Jardiance  10 mg daily. - POCT glycosylated hemoglobin (Hb A1C) - POCT glucose (manual entry) - empagliflozin  (JARDIANCE ) 10 MG TABS tablet; Take 1 tablet (10 mg total) by mouth daily before breakfast.  Dispense: 90 tablet; Refill: 1 - Microalbumin / creatinine urine ratio  2. Diabetes mellitus treated with oral medication (HCC) 3. Long-term (current) use of injectable non-insulin  antidiabetic drugs See #1 above  4. Hypertension associated with diabetes (HCC) At goal.  Continue hydralazine  75 mg 3 times a day, carvedilol  25 mg twice a day, isosorbide  30 mg daily, furosemide  40 mg half a tablet daily spironolactone  grams daily   5. Chronic combined systolic and diastolic CHF, NYHA class 1 (HCC) Stable and compensated. Continue carvedilol , hydralazine , isosorbide , Furosemide  and Jardiance    6. Vaginal dryness Recommend trying Replens vaginal moisturizers solution over-the-counter.  7. Screening for colon cancer Health maintenance is showing that she is due for colon cancer screening.  We had ordered Cologuard test in the summer of last year.  Patient states  that she did mail that back in.  We did not get results.  I will have my CMA reach out to [employer] about this.   Patient was given the opportunity to ask questions.  Patient verbalized understanding of the plan and was able to repeat key elements of the plan.   This documentation was completed using Paediatric nurse.  Any transcriptional errors are unintentional.  Orders Placed This Encounter  Procedures   Microalbumin / creatinine urine ratio   POCT glycosylated hemoglobin (Hb A1C)   POCT glucose (manual entry)     Requested Prescriptions   Signed Prescriptions Disp Refills   empagliflozin  (JARDIANCE ) 10 MG TABS tablet 90 tablet 1    Sig: Take 1 tablet (10 mg total) by mouth daily before breakfast.    Return in about 4 months (around  10/06/2023).  Concetta Dee, MD, FACP

## 2023-06-08 LAB — MICROALBUMIN / CREATININE URINE RATIO
Creatinine, Urine: 139.7 mg/dL
Microalb/Creat Ratio: 5 mg/g{creat} (ref 0–29)
Microalbumin, Urine: 7.3 ug/mL

## 2023-06-09 ENCOUNTER — Encounter: Payer: Self-pay | Admitting: Internal Medicine

## 2023-06-10 ENCOUNTER — Other Ambulatory Visit (HOSPITAL_COMMUNITY): Payer: Self-pay | Admitting: Pharmacist

## 2023-06-10 MED ORDER — STUDY - OCEANIC-STROKE - ASUNDEXIAN 50 MG OR PLACEBO TABLET (PI-SETHI): 1.0000 | ORAL_TABLET | Freq: Every day | ORAL | 0 refills | Status: AC

## 2023-06-11 ENCOUNTER — Ambulatory Visit (INDEPENDENT_AMBULATORY_CARE_PROVIDER_SITE_OTHER): Payer: PRIVATE HEALTH INSURANCE | Admitting: Licensed Clinical Social Worker

## 2023-06-11 DIAGNOSIS — F331 Major depressive disorder, recurrent, moderate: Secondary | ICD-10-CM | POA: Diagnosis not present

## 2023-06-12 ENCOUNTER — Encounter (HOSPITAL_COMMUNITY): Payer: Self-pay | Admitting: Licensed Clinical Social Worker
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
## 2023-06-13 ENCOUNTER — Ambulatory Visit: Payer: PRIVATE HEALTH INSURANCE | Admitting: Neurology

## 2023-06-16 ENCOUNTER — Telehealth: Payer: Self-pay | Admitting: Internal Medicine

## 2023-06-16 NOTE — Telephone Encounter (Signed)
-----   Message from [facility] Clarisa A sent at 06/14/2023  4:34 PM EDT ----- Regarding: RE: Cologuard Called & spoke to [place] at [facility] and the last order was delivered in August of 2024 but no sample was received at [facility]. The rep said the patient needs to call [facility] and request a new kit.  Called & spoke to the patient. Gave [facility]'s phone number. Patient expressed verbal understanding. ----- Message ----- From: Lawrance Presume, MD Sent: 06/06/2023   6:13 PM EDT To: Heywood Louder, CMA Subject: Cologuard                                      Patient reports having used and mailed back the Cologuard test around August of last year.  I never received results.  Please call [facility] and inquire whether they had received her kit or not.

## 2023-06-26 ENCOUNTER — Other Ambulatory Visit: Payer: Self-pay | Admitting: Internal Medicine

## 2023-06-26 DIAGNOSIS — E1169 Type 2 diabetes mellitus with other specified complication: Secondary | ICD-10-CM

## 2023-07-01 ENCOUNTER — Other Ambulatory Visit: Payer: Self-pay | Admitting: Internal Medicine

## 2023-07-01 DIAGNOSIS — I5042 Chronic combined systolic (congestive) and diastolic (congestive) heart failure: Secondary | ICD-10-CM

## 2023-07-03 NOTE — Progress Notes (Unsigned)
 Initial neurology clinic note  [name] MRN: [id number] DOB: [birth year]  Referring provider: Lawrance Presume, MD  Primary care provider: Lawrance Presume, MD  Reason for consult:  headaches, cognitive changes, numbness and tingling in legs, and stroke  Subjective:  This is [name], a 58 y.o. right-handed female with a medical history of HTN, DM2, HLD, stroke, anxiety, depression, vit D deficiency who presents to neurology clinic with headaches, cognitive changes, numbness and tingling in legs, and stroke. The patient is alone today.  Patient has multiple complaints today. She is concerned about forgetting things and headaches.  Regarding her headaches, this has been going on for years. It is mostly in the back left part of her head. It is a pressure sensation. She does endorse some pain in the neck as well, maybe more on the right side of the neck pain. She gets the headache about once per week. She isn't sure exactly, but thinks the pain lasts a few hours. She will take tylenol  and this will help. She will feel off balance and staggering and endorses phonophobia. She denies photophobia or nausea or vomiting.  Regarding her memory, she states she forgets simple things like if she takes her medication or dates. This has been present since her first stroke (07/20/2018). Her symptoms at the time of stroke was slurred speech. This improved with no residual symptoms. Regarding sleep, she sleeps about 6 hours a night, but has to get up twice a night to go to the bathroom. She feels well rested when she wakes, but is tired throughout the day. She will nap during the day if able. She thinks she snores. She has never had a sleep study. Regarding her mood, she does see a therapist for anxiety and depression. She has her ups and downs regarding this. Of note, her vitamin D  is low . She takes this once per week.  Patient had another stroke at the end of 11/2022. She was again  staggering and her speech was different. Patient does not remember how she got to the hospital, but was admitted from 12/11/22-12/12/22. MRI brain showed left cerebellum stroke and enhancing lesion in left cerebellum, subacute infarct vs neoplastic lesion. CTA showed left vertebral artery dissection. She was put on plavix  75 mg daily in addition to aspirin  81 mg daily she was already on. The plan was for 3 months of DAPT with follow up CTA then aspirin  monotherapy. There is some confusion about medications, but she thinks she no longer takes plavix  and is only on aspirin . She is on crestor  40 mg and zetia  10 mg daily for HLD.  Of note, patient has numbness and tingling in her legs presumed to be diabetic neuropathy. She has been prescribed gabapentin  in the past, but has never taken it before.  She denies any constitutional symptoms such as fevers, night sweats, or unintended weight loss.  Smoker: never OCP/hormone replacement therapy: no EtOH use: 2 glasses of wine per week Restrictive diet: no Family history of neurologic disease: lot of strokes on mother's side of the family; mother had cancer  For secondary stroke prevention, patient is on aspirin  81 mg daily, Crestor  40 mg daily, and zetia  10 mg daily   MEDICATIONS:  Outpatient Encounter Medications as of 07/11/2023  Medication Sig   albuterol  (VENTOLIN  HFA) 108 (90 Base) MCG/ACT inhaler Inhale 2 puffs into the lungs every 6 (six) hours as needed for wheezing or shortness of breath.   aspirin  81 MG chewable tablet  Chew 1 tablet (81 mg total) by mouth daily.   busPIRone  (BUSPAR ) 5 MG tablet Take 1 tablet (5 mg total) by mouth 2 (two) times daily.   carvedilol  (COREG ) 25 MG tablet TAKE 1 TABLET(25 MG) BY MOUTH TWICE DAILY WITH A MEAL   Continuous Blood Gluc Transmit (DEXCOM G6 TRANSMITTER) MISC 1 packet by Does not apply route daily. (Patient not taking: Reported on 07/11/2023)   Dulaglutide  (TRULICITY ) 1.5 MG/0.5ML SOAJ ADMINISTER 1.5 MG  UNDER THE SKIN 1 TIME A WEEK   empagliflozin  (JARDIANCE ) 10 MG TABS tablet Take 1 tablet (10 mg total) by mouth daily before breakfast.   ezetimibe  (ZETIA ) 10 MG tablet Take 1 tablet (10 mg total) by mouth daily.   furosemide  (LASIX ) 40 MG tablet TAKE 1 TABLET BY MOUTH EVERY DAY   gabapentin  (NEURONTIN ) 100 MG capsule Take 2 capsules (200 mg total) by mouth at bedtime. For diabetic neuropathy symptoms (Patient not taking: Reported on 07/11/2023)   hydrALAZINE  (APRESOLINE ) 50 MG tablet Take 1.5 tablets (75 mg total) by mouth 3 (three) times daily.   isosorbide  mononitrate (IMDUR ) 30 MG 24 hr tablet TAKE 1 TABLET(30 MG) BY MOUTH DAILY   LANTUS  SOLOSTAR 100 UNIT/ML Solostar Pen Inject 40 Units into the skin daily.   metFORMIN  (GLUCOPHAGE -XR) 500 MG 24 hr tablet TAKE 1 TABLET(500 MG) BY MOUTH DAILY WITH BREAKFAST   metroNIDAZOLE  (FLAGYL ) 500 MG tablet Take 1 tablet (500 mg total) by mouth 2 (two) times daily.   pantoprazole  (PROTONIX ) 40 MG tablet Take 1 tablet (40 mg total) by mouth daily.   rosuvastatin  (CRESTOR ) 40 MG tablet Take 1 tablet (40 mg total) by mouth daily.   spironolactone  (ALDACTONE ) 25 MG tablet TAKE 1 TABLET(25 MG) BY MOUTH DAILY   Study - OCEANIC-STROKE - asundexian 50 mg or placebo tablet (PI-Sethi) Take 1 tablet (50 mg total) by mouth daily. For Investigational Only. Take 1 tablet by mouth once daily at the same time each day, preferably in the morning. Please bring bottle to every visit.   Vitamin D , Ergocalciferol , (DRISDOL ) 1.25 MG (50000 UNIT) CAPS capsule Take 1 capsule (50,000 Units total) by mouth every 7 (seven) days.   No facility-administered encounter medications on file as of 07/11/2023.    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, now resolved   Systolic CHF (HCC)    Echo 05/19/11 EF 35% w/ mild-mod MR    PAST SURGICAL HISTORY: Past Surgical History:  Procedure Laterality Date   BREAST BIOPSY Left 05/24/2022   MM LT BREAST BX W LOC DEV 1ST LESION IMAGE BX SPEC STEREO GUIDE 05/24/2022 [facility]   CERVIX LESION DESTRUCTION     DILATION AND CURETTAGE OF UTERUS     LEFT HEART CATHETERIZATION WITH CORONARY ANGIOGRAM N/A 05/21/2011   Procedure: LEFT HEART CATHETERIZATION WITH CORONARY ANGIOGRAM;  Surgeon: Arnoldo Lapping, MD;  Location: [facility] C[facility];  Service: Cardiovascular;  Laterality: N/A;   TUBAL LIGATION  1996    ALLERGIES: Allergies  Allergen Reactions   Lisinopril  Hives and Swelling    Facial    Sertraline  Hives and Swelling    Facial    Tramadol  Hives and Swelling    facial   Ibuprofen Hives and Swelling    FAMILY HISTORY: Family History  Problem Relation Age of Onset   Diabetes Other        multiple   Heart failure Paternal Grandmother    Breast cancer Paternal Grandmother    Heart disease Other        multiple   Breast cancer Mother     SOCIAL HISTORY: Social History   Tobacco Use   Smoking status: Never   Smokeless tobacco: Never  Vaping Use   Vaping status: Never Used  Substance Use Topics   Alcohol use: Yes    Comment: occ wine   Drug use: No   Social History   Social History Narrative   Lives in [place].  Presently unemployed but attends school full time   Are you right handed or left handed? Right    Are you currently employed ?    What is your current occupation? [employer]   Do you live at  home alone?yes   Who lives with you?    What type of home do you live in: 1 story or 2 story? one   Caffiene 1 soda a day     Objective:  Vital Signs:  BP 104/64   Pulse 90   Ht 5\' 9"  (1.753 m)   Wt 197 lb (89.4 kg)   SpO2 98%   BMI 29.09 kg/m   General: No acute distress.  Patient appears well-groomed.   Head:  Normocephalic/atraumatic Neck: supple, no paraspinal tenderness, full range of motion Back: No paraspinal tenderness Heart: regular rate and rhythm Lungs: Clear to auscultation bilaterally. Vascular: No carotid bruits.  Neurological Exam: Mental status: alert and oriented, speech fluent and not dysarthric, language intact.  Cranial nerves: CN I: not tested CN II: pupils equal, round and reactive to light, visual fields intact CN III, IV, VI:  full range of motion, no nystagmus, no ptosis CN V: facial sensation intact. CN VII: upper and lower face symmetric CN VIII: hearing intact CN IX, X: uvula midline CN XI: sternocleidomastoid and trapezius muscles intact CN XII: tongue midline  Bulk & Tone: normal, no fasciculations. Motor:  muscle strength 5/5 throughout Deep Tendon Reflexes:  2+ throughout.   Sensation:  Pinprick, vibratory, and proprioceptive sensation intact. Finger to nose testing:  Without dysmetria.   Gait:  Normal station and stride.  Romberg negative.   Labs and Imaging review: Internal labs: HbA1c (06/06/23): 6.7  03/12/23: B12 466 BMP significant for glucose 163, Cr 1.05  Vit D (12/24/22): 14.0 Lipid panel (12/12/22): tChol 187, LDL 128, TG 119  Imaging/Procedures: CT head wo contrast (12/11/22): IMPRESSION: 1. Possible acute infarct in the medial aspect of the left cerebellum. Recommend brain MRI for further evaluation. 2. Chronic infarcts in the left frontal lobe.  MRI brain w/wo contrast (12/11/22): FINDINGS: Brain: Restricted diffusion with ADC correlate in the inferomedial left cerebellum (series 5, image 62 and series 7,  image 51), which is associated with increased T2 hyperintense signal and correlates with the hypodense focus noted on the same-day CT. Additional peripheral focus of restricted diffusion with ADC correlate lateral left cerebellum (series 5, image 65), which is also associated with increased  T2 hyperintense signal but is not particularly hypodense on the same-day CT.   Enhancing 7 mm focus in the left cerebellum (series 16, image 10). No other abnormal parenchymal or meningeal enhancement.   No acute hemorrhage, mass, mass effect, or midline shift. No hydrocephalus or extra-axial collection. Pituitary and craniocervical junction within normal limits. Remote infarcts in the left anterior frontal lobe and left frontoparietal region. Additional remote lacunar infarcts in the bilateral cerebellar hemispheres   Vascular: Normal arterial flow voids.   Skull and upper cervical spine: Normal marrow signal.   Sinuses/Orbits: Clear paranasal sinuses. No acute finding in the orbits.   Other: Fluid in the right mastoid air cells.   IMPRESSION: 1. Acute infarct in the inferomedial left cerebellum, which correlates with the hypodense focus noted on the same-day CT. Additional smaller acute infarct in the lateral left cerebellum. 2. Enhancing focus in the left cerebellum is favored to represent a subacute infarct, although an enhancing neoplastic lesion could appear similar. Attention on follow-up.  CTA head and neck (12/12/22): FINDINGS: CT HEAD FINDINGS   Brain: Recent infarcts in the left cerebellum confirmed by MRI. Small chronic infarcts in the superior left frontal lobe in 2 separate locations. No acute hemorrhage, hydrocephalus, or mass.   Vascular: No hyperdense vessel or unexpected calcification.   Skull: Normal. Negative for fracture or focal lesion.   Sinuses/Orbits: No acute finding.   Review of the MIP images confirms the above findings   CTA NECK FINDINGS   Aortic  arch: Atheromatous plaque with 3 vessel branching   Right carotid system: Atheromatous calcification scratch the atheromatous wall thickening with greatest and partially calcified plaque at the bifurcation. No stenosis or ulceration.   Left carotid system: Low-density atheromatous wall thickening especially of the common carotid. No stenosis or ulceration.   Vertebral arteries: Diffusely attenuated left vertebral artery with thready flow, a change since 2018. This is a presumed dissection. The patient sustained a left carotid dissection previously. No underlying vasculopathy is detected.   Skeleton: No acute finding   Other neck: No acute finding   Upper chest: Clear apical lungs   Review of the MIP images confirms the above findings   CTA HEAD FINDINGS   Anterior circulation: Atheromatous calcification of the cavernous carotids. No branch occlusion, beading, or aneurysm. No proximal flow reducing stenosis.   Posterior circulation: Attenuated left vertebral artery continues to the basilar, a change from prior. Fetal type right PCA flow. No branch occlusion, beading, or aneurysm.   Venous sinuses: Unremarkable   Anatomic variants: None significant   Review of the MIP images confirms the above findings   IMPRESSION: 1. The patient's left cerebellar infarcts are associated with left vertebral dissection with diffusely and highly attenuated left vertebral artery that is new from a 2018 CTA. 2. Atherosclerosis without flow reducing atheromatous stenosis.  Assessment/Plan:  Emina Ribaudo is a 58 y.o. female who presents for evaluation of headaches, cognitive changes, numbness and tingling in legs, and stroke. She has a relevant medical history of HTN, DM2, HLD, stroke, anxiety, depression, vit D deficiency. Her neurological examination is essentially normal today. Available diagnostic data is significant for HbA1c of 6.7, LDL 128 (2024). MRI brain showed an enhancing lesion  in left cerebellum that was felt to be stroke due to CTA showing left vertebral artery dissection, but that also could be neoplastic. Patient was to have repeat imaging, which has not been done yet, so I will do this. The dissection could be causing left neck/head pain  and lesion in cerebellum is likely the cause of her dizzy sensations.  Regarding the cognitive changes, she has depression and poor sleep which are the likely drivers of symptoms. Her MRI brain did not suggest atrophy that would suggest another etiology. I will send her to sleep medicine to evaluate as if she had sleep apnea, treating this could help memory but also reduce risk of heart attack or stroke.  The numbness and tingling in her legs could be due to neuropathy, particularly given her history of diabetes. Her examination does not show clear deficits, so if neuropathy, it is likely mild. She was prescribed gabapentin , but never got the medication.  PLAN: -Blood work: Lipid panel, B1, IFE, TSH -Repeat MRI brain w/wo contrast -Repeat CTA head and neck  For secondary stroke prevention: -Continue aspirin  81 mg daily -Continue Crestor  40 mg and Zetia  10 mg daily  For cognitive changes: -Labs as above -Sleep medicine referral for possible OSA -Continue to see therapist. May discuss mood with PCP if needed as medications could also be considered  For numbness and tingling in legs: -Start gabapentin  200 mg at bedtime as previously prescribed for potential neuropathy  -Continue Vit D supplementation  -Return to clinic in 6 months  The impression above as well as the plan as outlined below were extensively discussed with the patient who voiced understanding. All questions were answered to their satisfaction.  When available, results of the above investigations and possible further recommendations will be communicated to the patient via telephone/MyChart. Patient to call office if not contacted after expected testing  turnaround time.   Total time spent reviewing records, interview, history/exam, documentation, and coordination of care on day of encounter:  70 min   Thank you for allowing me to participate in patient's care.  If I can answer any additional questions, I would be pleased to do so.  Rommie Coats, MD   CC: Lawrance Presume, MD [address] [place] [place] [address]  CC: Referring provider: Lawrance Presume, MD [address] [place],  [place] [address]

## 2023-07-09 ENCOUNTER — Encounter (HOSPITAL_COMMUNITY): Payer: Self-pay | Admitting: Licensed Clinical Social Worker

## 2023-07-09 ENCOUNTER — Ambulatory Visit (INDEPENDENT_AMBULATORY_CARE_PROVIDER_SITE_OTHER): Payer: PRIVATE HEALTH INSURANCE | Admitting: Licensed Clinical Social Worker

## 2023-07-09 ENCOUNTER — Encounter (HOSPITAL_COMMUNITY): Payer: Self-pay

## 2023-07-09 DIAGNOSIS — F331 Major depressive disorder, recurrent, moderate: Secondary | ICD-10-CM

## 2023-07-09 NOTE — Progress Notes (Signed)
   THERAPIST PROGRESS NOTE  Session Time: 4:30pm-5:30pm  Participation Level: Active  Behavioral Response: Well GroomedAlertDepressed  Type of Therapy: Individual Therapy  Treatment Goals addressed:  Problem: OP Depression     Dates: Start:  07/09/23       Disciplines: Interdisciplinary, PROVIDER        Goal: LTG: Reduce frequency, intensity, and duration of depression symptoms so that daily functioning is improved     Dates: Start:  07/09/23    Expected End:  01/09/24       Disciplines: Interdisciplinary, PROVIDER             Goal: LTG: Increase coping skills to manage depression and improve ability to perform daily activities     Dates: Start:  07/09/23    Expected End:  01/09/24       Disciplines: Interdisciplinary, PROVIDER             Intervention: Work with [name] to track symptoms, triggers, and/or skill use through a mood chart, diary card, or journal     Dates: Start:  07/09/23                Intervention: [name] to participate in recovery peer support activities weekly      Dates: Start:  07/09/23         ProgressTowards Goals: Progressing  Interventions: CBT  Summary: [name] is a 58 y.o. female who presents with MDD, recurrent, moderate.   Suicidal/Homicidal: Nowithout intent/plan  Therapist Response: [name] engaged well in individual in person session with clinician. Clinician utilized CBT to process thoughts, feelings, and interactions. Clinician identified that her birthday will be the 6 year anniversary of her pneumonia/coma/stroke hospitalization. [name] shared this continues to be a hard day and she feels somewhat worried. Clinician reframed this as a celebration of the good work she has done to keep herself healthy, both physically and mentally. Clinician provided time and space for [name] to share more about her life growing up, her experience being "invisible", and how that has manifested in her life. Clinician assisted [name] in  making sense of this and noticing how she has become a helper and a people pleaser. Clinician also noted the value of seeking out her own happiness, doing things she wants to do, and being able to make her own decisions.   Plan: Return again in 2 weeks.  Diagnosis: MDD (major depressive disorder), recurrent episode, moderate (HCC)  Collaboration of Care: Patient refused AEB none required  Patient/Guardian was advised Release of Information must be obtained prior to any record release in order to collaborate their care with an outside provider. Patient/Guardian was advised if they have not already done so to contact the registration department to sign all necessary forms in order for us  to release information regarding their care.   Consent: Patient/Guardian gives verbal consent for treatment and assignment of benefits for services provided during this visit. Patient/Guardian expressed understanding and agreed to proceed.   Merleen Stare [place], LCSW 07/09/2023

## 2023-07-11 ENCOUNTER — Ambulatory Visit: Payer: PRIVATE HEALTH INSURANCE | Admitting: Neurology

## 2023-07-11 ENCOUNTER — Other Ambulatory Visit: Payer: Self-pay | Admitting: Internal Medicine

## 2023-07-11 ENCOUNTER — Encounter: Payer: Self-pay | Admitting: Neurology

## 2023-07-11 ENCOUNTER — Other Ambulatory Visit: Payer: PRIVATE HEALTH INSURANCE

## 2023-07-11 VITALS — BP 104/64 | HR 90 | Ht 69.0 in | Wt 197.0 lb

## 2023-07-11 DIAGNOSIS — E1169 Type 2 diabetes mellitus with other specified complication: Secondary | ICD-10-CM

## 2023-07-11 DIAGNOSIS — R42 Dizziness and giddiness: Secondary | ICD-10-CM

## 2023-07-11 DIAGNOSIS — I63012 Cerebral infarction due to thrombosis of left vertebral artery: Secondary | ICD-10-CM

## 2023-07-11 DIAGNOSIS — R202 Paresthesia of skin: Secondary | ICD-10-CM

## 2023-07-11 DIAGNOSIS — R0683 Snoring: Secondary | ICD-10-CM

## 2023-07-11 DIAGNOSIS — G4719 Other hypersomnia: Secondary | ICD-10-CM

## 2023-07-11 DIAGNOSIS — E1142 Type 2 diabetes mellitus with diabetic polyneuropathy: Secondary | ICD-10-CM

## 2023-07-11 DIAGNOSIS — I7774 Dissection of vertebral artery: Secondary | ICD-10-CM

## 2023-07-11 DIAGNOSIS — E669 Obesity, unspecified: Secondary | ICD-10-CM

## 2023-07-11 DIAGNOSIS — R519 Headache, unspecified: Secondary | ICD-10-CM

## 2023-07-11 DIAGNOSIS — R200 Anesthesia of skin: Secondary | ICD-10-CM

## 2023-07-11 DIAGNOSIS — M542 Cervicalgia: Secondary | ICD-10-CM

## 2023-07-11 MED ORDER — GABAPENTIN 100 MG PO CAPS
200.0000 mg | ORAL_CAPSULE | Freq: Every day | ORAL | 3 refills | Status: AC
Start: 2023-07-11 — End: ?

## 2023-07-11 NOTE — Telephone Encounter (Unsigned)
 Copied from CRM [id number]. Topic: Clinical - Medication Refill >> Jul 11, 2023  8:58 AM El Gravely T wrote: Medication: gabapentin  (NEURONTIN ) 100 MG capsule  Has the patient contacted their pharmacy? Yes (Agent: If no, request that the patient contact the pharmacy for the refill. If patient does not wish to contact the pharmacy document the reason why and proceed with request.) (Agent: If yes, when and what did the pharmacy advise?)  This is the patient's preferred pharmacy:   [id number] Jonette Nestle, [place] - [address] AT [facility] OF [facility] & [address] [place] [address] Phone: [phone] Fax: [phone]    Is this the correct pharmacy for this prescription? Yes If no, delete pharmacy and type the correct one.   Has the prescription been filled recently? Yes  Is the patient out of the medication? Yes  Has the patient been seen for an appointment in the last year OR does the patient have an upcoming appointment? Yes  Can we respond through MyChart? No, prefers a phone call  Agent: Please be advised that Rx refills may take up to 3 business days. We ask that you follow-up with your pharmacy.

## 2023-07-11 NOTE — Telephone Encounter (Signed)
 Copied from CRM [id number]. Topic: Clinical - Medication Refill >> Jul 11, 2023  9:06 AM Fonda T wrote: Medication: LANTUS  SOLOSTAR 100 UNIT/ML Solostar Pen  Has the patient contacted their pharmacy? Yes (Agent: If no, request that the patient contact the pharmacy for the refill. If patient does not wish to contact the pharmacy document the reason why and proceed with request.) (Agent: If yes, when and what did the pharmacy advise?)  This is the patient's preferred pharmacy:  [id number] Jonette Nestle, [place] - [address] AT [facility] OF [facility] & [address] [place] [address] [place] [address] Phone: [phone] Fax: [phone]   Is this the correct pharmacy for this prescription? Yes If no, delete pharmacy and type the correct one.   Has the prescription been filled recently? Yes  Is the patient out of the medication? Yes, last dose taken today  Has the patient been seen for an appointment in the last year OR does the patient have an upcoming appointment? Yes  Can we respond through MyChart? No, prefers a phone call  Agent: Please be advised that Rx refills may take up to 3 business days. We ask that you follow-up with your pharmacy.

## 2023-07-11 NOTE — Patient Instructions (Addendum)
 I saw you today for headache/neck pain, memory problems, numbness and tingling in your legs, and stroke.  Your head and neck pain may be due to the blood vessel problem in your left neck (vertebral artery dissection). This likely caused your stroke which is causing the dizzy sensation you have. I need to get a repeat of your MRI and blood vessel imaging to make sure that this is a stroke and that your blood vessel has healed. I will also get more lab work today. Continue your aspirin  81 mg daily. Your memory problems are likely due to possible sleep problems (maybe sleep apnea) and mood (depression). I would like to send you to sleep medicine to evaluate you for potential sleep apnea and treat you if necessary. Not only can this help memory, but it can help reduce your risk of heart attack or stroke. Continue to see your therapist for depression. Speak to your primary care if you think you need medication for your mood. For the numbness and tingling in your legs, I will resend the gabapentin  200 mg at bedtime to see if this helps.  I will be in touch when I have your labs and imaging results. Please let me know if you have any questions or concerns in the meantime.  I will see you back in clinic in about 6 months.  If you have new difficulty speaking, face droop, numbness on one side of the body, weakness on one side of the body, or dizziness/imbalance, this could be the sign of a stroke. Don't wait, please call EMS and be evaluated at the nearest emergency room.  The physicians and staff at [facility] are committed to providing excellent care. You may receive a survey requesting feedback about your experience at our office. We strive to receive "very good" responses to the survey questions. If you feel that your experience would prevent you from giving the office a "very good " response, please contact our office to try to remedy the situation. We may be reached at [phone]. Thank you for  taking the time out of your busy day to complete the survey.  Rommie Coats, MD [facility]

## 2023-07-12 NOTE — Addendum Note (Signed)
 Addended by: Arturo Bing on: 07/12/2023 08:26 AM   Modules accepted: Orders

## 2023-07-12 NOTE — Telephone Encounter (Signed)
 Requested medications are due for refill today.  unsure  Requested medications are on the active medications list.  yes  Last refill. 10/25/2022 45 mL 6 rf  Future visit scheduled.   yes  Notes to clinic.  Rx from 10/25/2022 is enough medication for 787 days. Please advise.   Requested Prescriptions  Pending Prescriptions Disp Refills   LANTUS  SOLOSTAR 100 UNIT/ML Solostar Pen 45 mL 6    Sig: Inject 40 Units into the skin daily.     Endocrinology:  Diabetes - Insulins Passed - 07/12/2023  5:57 PM      Passed - HBA1C is between 0 and 7.9 and within 180 days    HbA1c, POC (controlled diabetic range)  Date Value Ref Range Status  06/06/2023 6.7 0.0 - 7.0 % Final         Passed - Valid encounter within last 6 months    Recent Outpatient Visits           1 month ago Type 2 diabetes mellitus with other specified complication, with long-term current use of insulin  (HCC)   [facility] - A Dept Of [place]. [facility] Concetta Dee B, MD   4 months ago Type 2 diabetes mellitus with obesity [facility])   [place] [facility] - A Dept Of [place]. [facility] Lawrance Presume, MD   6 months ago Hospital discharge follow-up   [facility] - A Dept Of [place]. [facility] Concetta Dee B, MD   9 months ago Type 2 diabetes mellitus with obesity [facility])   [place] [facility] - A Dept Of [place]. [facility] Lawrance Presume, MD   1 year ago Left ear pain   [facility], [place], NP

## 2023-07-13 MED ORDER — LANTUS SOLOSTAR 100 UNIT/ML ~~LOC~~ SOPN
40.0000 [IU] | PEN_INJECTOR | Freq: Every day | SUBCUTANEOUS | 6 refills | Status: DC
Start: 2023-07-13 — End: 2023-10-03

## 2023-07-17 ENCOUNTER — Ambulatory Visit: Payer: Self-pay | Admitting: Neurology

## 2023-07-17 LAB — LIPID PANEL
Cholesterol: 241 mg/dL — ABNORMAL HIGH (ref <200)
HDL: 55 mg/dL (ref >=50)
LDL Cholesterol (Calc): 163 mg/dL — ABNORMAL HIGH
Non-HDL Cholesterol (Calc): 186 mg/dL — ABNORMAL HIGH (ref <130)
Total CHOL/HDL Ratio: 4.4 (calc) (ref <5.0)
Triglycerides: 111 mg/dL (ref <150)

## 2023-07-17 LAB — TSH: TSH: 1.66 m[IU]/L

## 2023-07-17 LAB — IMMUNOFIXATION ELECTROPHORESIS
IgM, Serum: 262 mg/dL (ref 50–300)
IgM, Serum: 1314 mg/dL (ref 600–300)
Immunoglobulin A: 1314 mg/dL (ref 600–310)
Immunoglobulin A: 257 mg/dL (ref 47–310)

## 2023-07-17 LAB — VITAMIN B1: Vitamin B1 (Thiamine): 21 nmol/L (ref 8–30)

## 2023-07-23 ENCOUNTER — Ambulatory Visit (INDEPENDENT_AMBULATORY_CARE_PROVIDER_SITE_OTHER): Payer: PRIVATE HEALTH INSURANCE | Admitting: Licensed Clinical Social Worker

## 2023-07-23 DIAGNOSIS — F330 Major depressive disorder, recurrent, mild: Secondary | ICD-10-CM | POA: Diagnosis not present

## 2023-07-23 LAB — COLOGUARD: COLOGUARD: NEGATIVE

## 2023-07-26 ENCOUNTER — Encounter: Payer: Self-pay | Admitting: Neurology

## 2023-07-26 ENCOUNTER — Encounter (HOSPITAL_COMMUNITY): Payer: Self-pay | Admitting: Licensed Clinical Social Worker

## 2023-07-26 NOTE — Progress Notes (Signed)
   THERAPIST PROGRESS NOTE  Session Time: 4:30pm-5:30pm  Participation Level: Active  Behavioral Response: Well GroomedAlertEuthymic  Type of Therapy: Individual Therapy  Treatment Goals addressed:       Problem: OP Depression      Dates: Start:  07/09/23        Disciplines: Interdisciplinary, PROVIDER            Goal: LTG: Reduce frequency, intensity, and duration of depression symptoms so that daily functioning is improved     Dates: Start:  07/09/23    Expected End:  01/09/24       Disciplines: Interdisciplinary, PROVIDER                Goal: LTG: Increase coping skills to manage depression and improve ability to perform daily activities     Dates: Start:  07/09/23    Expected End:  01/09/24       Disciplines: Interdisciplinary, PROVIDER                Intervention: Work with [name] to track symptoms, triggers, and/or skill use through a mood chart, diary card, or journal     Dates: Start:  07/09/23                   Intervention: [name] to participate in recovery peer support activities weekly      Dates: Start:  07/09/23          ProgressTowards Goals: Progressing  Interventions: CBT  Summary: [name] is a 58 y.o. female who presents with MDD, recurrent, mild.   Suicidal/Homicidal: Nowithout intent/plan  Therapist Response: [name] engaged well in individual and person session with clinician.  Clinician utilized CBT to process thoughts feelings and interactions.  [name] shared updates in mood and family interactions.  Clinician reflected the joy [name] felt with her birthday celebrations.  She shared much more attention than she expected from her family and friends.  [name] also shared that she has done better to communicate boundaries with her family.  Clinician explored satisfaction and pride in being able to improve this communication.  [name] shared increased sense of self-esteem and self-worth.  Plan: Return again in 2 weeks.  Diagnosis:  MDD (major depressive disorder), recurrent episode, mild (HCC)  Collaboration of Care: Patient refused AEB none required  Patient/Guardian was advised Release of Information must be obtained prior to any record release in order to collaborate their care with an outside provider. Patient/Guardian was advised if they have not already done so to contact the registration department to sign all necessary forms in order for us  to release information regarding their care.   Consent: Patient/Guardian gives verbal consent for treatment and assignment of benefits for services provided during this visit. Patient/Guardian expressed understanding and agreed to proceed.   Merleen Stare [place], LCSW 07/26/2023

## 2023-08-06 ENCOUNTER — Ambulatory Visit (HOSPITAL_COMMUNITY): Payer: PRIVATE HEALTH INSURANCE | Admitting: Licensed Clinical Social Worker

## 2023-08-10 ENCOUNTER — Ambulatory Visit
Admission: RE | Admit: 2023-08-10 | Discharge: 2023-08-10 | Disposition: A | Discharge status: Home / Self Care | Payer: PRIVATE HEALTH INSURANCE | Source: Ambulatory Visit | Attending: Neurology | Admitting: Neurology

## 2023-08-10 DIAGNOSIS — R200 Anesthesia of skin: Secondary | ICD-10-CM

## 2023-08-10 DIAGNOSIS — R42 Dizziness and giddiness: Secondary | ICD-10-CM

## 2023-08-10 DIAGNOSIS — R519 Headache, unspecified: Secondary | ICD-10-CM

## 2023-08-10 DIAGNOSIS — I63012 Cerebral infarction due to thrombosis of left vertebral artery: Secondary | ICD-10-CM

## 2023-08-10 MED ORDER — GADOPICLENOL 0.5 MMOL/ML IV SOLN
8.0000 mL | Freq: Once | INTRAVENOUS | Status: AC | PRN
  Administered 2023-08-10: 8 mL via INTRAVENOUS

## 2023-09-03 ENCOUNTER — Ambulatory Visit (HOSPITAL_COMMUNITY): Payer: PRIVATE HEALTH INSURANCE | Admitting: Licensed Clinical Social Worker

## 2023-10-02 ENCOUNTER — Telehealth: Payer: Self-pay | Admitting: Internal Medicine

## 2023-10-02 NOTE — Telephone Encounter (Signed)
 Doris Agricultural consultant confirmed appt for 8/21

## 2023-10-03 ENCOUNTER — Encounter: Payer: Self-pay | Admitting: Internal Medicine

## 2023-10-03 ENCOUNTER — Ambulatory Visit: Payer: PRIVATE HEALTH INSURANCE | Attending: Internal Medicine | Admitting: Internal Medicine

## 2023-10-03 ENCOUNTER — Other Ambulatory Visit: Payer: Self-pay

## 2023-10-03 VITALS — BP 152/74 | HR 80 | Temp 98.0°F | Ht 69.0 in | Wt 208.0 lb

## 2023-10-03 DIAGNOSIS — E1142 Type 2 diabetes mellitus with diabetic polyneuropathy: Secondary | ICD-10-CM

## 2023-10-03 DIAGNOSIS — E1169 Type 2 diabetes mellitus with other specified complication: Secondary | ICD-10-CM

## 2023-10-03 DIAGNOSIS — E785 Hyperlipidemia, unspecified: Secondary | ICD-10-CM

## 2023-10-03 DIAGNOSIS — I152 Hypertension secondary to endocrine disorders: Secondary | ICD-10-CM

## 2023-10-03 DIAGNOSIS — Z599 Problem related to housing and economic circumstances, unspecified: Secondary | ICD-10-CM

## 2023-10-03 DIAGNOSIS — Z7984 Long term (current) use of oral hypoglycemic drugs: Secondary | ICD-10-CM

## 2023-10-03 DIAGNOSIS — E119 Type 2 diabetes mellitus without complications: Secondary | ICD-10-CM

## 2023-10-03 DIAGNOSIS — F322 Major depressive disorder, single episode, severe without psychotic features: Secondary | ICD-10-CM

## 2023-10-03 DIAGNOSIS — I5042 Chronic combined systolic (congestive) and diastolic (congestive) heart failure: Secondary | ICD-10-CM

## 2023-10-03 DIAGNOSIS — Z7985 Long-term (current) use of injectable non-insulin antidiabetic drugs: Secondary | ICD-10-CM

## 2023-10-03 DIAGNOSIS — E1159 Type 2 diabetes mellitus with other circulatory complications: Secondary | ICD-10-CM | POA: Diagnosis not present

## 2023-10-03 DIAGNOSIS — Z9181 History of falling: Secondary | ICD-10-CM

## 2023-10-03 DIAGNOSIS — Z794 Long term (current) use of insulin: Secondary | ICD-10-CM

## 2023-10-03 DIAGNOSIS — F411 Generalized anxiety disorder: Secondary | ICD-10-CM

## 2023-10-03 LAB — POCT GLYCOSYLATED HEMOGLOBIN (HGB A1C): Hemoglobin A1C: 7.5 % — AB (ref 4.0–5.6)

## 2023-10-03 LAB — GLUCOSE, POCT (MANUAL RESULT ENTRY): POC Glucose: 87 mg/dL (ref 70–99)

## 2023-10-03 MED ORDER — TRULICITY 1.5 MG/0.5ML ~~LOC~~ SOAJ
1.5000 mg | SUBCUTANEOUS | 6 refills | Status: DC
  Filled 2023-10-03: qty 2, 28d supply, fill #0

## 2023-10-03 MED ORDER — LANTUS SOLOSTAR 100 UNIT/ML ~~LOC~~ SOPN: 40.0000 [IU] | PEN_INJECTOR | Freq: Every day | SUBCUTANEOUS | 6 refills | Status: DC

## 2023-10-03 MED ORDER — ROSUVASTATIN CALCIUM 40 MG PO TABS: 40.0000 mg | ORAL_TABLET | Freq: Every day | ORAL | 1 refills | Status: AC

## 2023-10-03 MED ORDER — EZETIMIBE 10 MG PO TABS: 10.0000 mg | ORAL_TABLET | Freq: Every day | ORAL | 2 refills | Status: AC

## 2023-10-03 MED ORDER — ISOSORBIDE MONONITRATE ER 30 MG PO TB24: 30.0000 mg | ORAL_TABLET | Freq: Every day | ORAL | 1 refills | Status: AC

## 2023-10-03 MED ORDER — CARVEDILOL 25 MG PO TABS: 25.0000 mg | ORAL_TABLET | Freq: Two times a day (BID) | ORAL | 1 refills | Status: DC

## 2023-10-03 MED ORDER — METFORMIN HCL ER 500 MG PO TB24: 500.0000 mg | ORAL_TABLET | Freq: Every day | ORAL | 1 refills | Status: DC

## 2023-10-03 MED ORDER — FUROSEMIDE 40 MG PO TABS: 40.0000 mg | ORAL_TABLET | Freq: Every day | ORAL | 1 refills | Status: DC

## 2023-10-03 MED ORDER — DULOXETINE HCL 20 MG PO CPEP: 20.0000 mg | ORAL_CAPSULE | Freq: Every day | ORAL | 3 refills | Status: DC

## 2023-10-03 NOTE — Patient Instructions (Signed)
 VISIT SUMMARY:  During your visit today, we discussed the management of your diabetes, hypertension, heart health, depression, and recent falls. We reviewed your current medications and made some adjustments to better control your symptoms and improve your overall health. We also talked about your financial concerns and the need for assistance with healthcare costs.  YOUR PLAN:  -TYPE 2 DIABETES MELLITUS WITH PERIPHERAL NEUROPATHY: Your diabetes management needs improvement as your A1c has increased to 7.5%. Peripheral neuropathy, especially in your right foot, is causing discomfort and falls. We will send a prescription for Trulicity  to the clinic pharmacy and refill your metformin  and Lantus  insulin . We are starting you on Cymbalta  20 mg once daily to help with depression, anxiety, and neuropathy. Please check your blood sugars at least once daily.  -HYPERTENSION AND HEART FAILURE: Your blood pressure is elevated, likely due to being out of carvedilol  and isosorbide  for a week. We will refill these medications along with furosemide . Please limit your salt intake and follow up with your cardiologist, Dr. Pietro.  -MAJOR DEPRESSIVE DISORDER AND ANXIETY DISORDER: Your depression and anxiety are significant and worsened by financial stress. We are starting you on Cymbalta  20 mg once daily. We will also refer you to a social worker to help with financial challenges and a potential Medicaid application.  -HYPERLIPIDEMIA: Your LDL cholesterol is elevated at 163 mg/dL. We will refill your rosuvastatin  and Zetia . Please bring your medications to your next visit for verification.  -HISTORY OF FALLS: Your recent falls are likely related to neuropathy in your right foot. Your gait appears stable, but we discussed the potential for a physical therapy referral for gait safety training if needed in the future.  -GENERAL HEALTH MAINTENANCE: You are due for a flu shot this fall. Please return for your flu shot  next month or obtain it at a local pharmacy.  INSTRUCTIONS:  Please schedule a follow-up appointment with your cardiologist, Dr. Pietro. A social worker will contact you regarding financial assistance. Check your blood sugars at least once daily and bring your medications to your next visit for verification.

## 2023-10-03 NOTE — Progress Notes (Signed)
 "   Patient ID: [name], female    DOB: Feb 14, 1965  MRN: [id number]  CC: chronic ds management  Subjective: [name] is a 58 y.o. female who presents for chronic ds management. 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   Discussed the use of AI scribe software for clinical note transcription with the patient, who gave verbal consent to proceed.  History of Present Illness [name] is a 58 year old female with diabetes and hypertension who presents for a four-month follow-up for chronic disease management.  Recent falls: She has experienced two falls since her last visit, one at work a month ago and another at home a few days ago.  Fall at work occurred when she tripped on something that was on the floor.  Had scraped up her knees but otherwise no other injuries.  The fall at home was attributed to neuropathy in her right foot, causing her foot to 'give out.' There was no loss of consciousness during either fall, but she needed to rest afterward.  She reports increased neuropathy symptoms, particularly in her right foot, which worsens by the end of her work shift. She takes gabapentin  at night for neuropathy but cannot take it during the day due to side effects. Her foot cramps after work, making it difficult to drive. She experiences tingling and numbness primarily in her right foot and sometimes in her left leg.  MDD/GAD: She is dealing with  a lot of depression and anxiety. On Buspar . She attended two behavioral health sessions but found them unaffordable. She is concerned about her financial situation, including co-pays, cost of medications and rent that will be increasing in the next few months. Feels she is not making  enough to cover all of her needs including medical. She is wondering about applying for disability.  She is requesting an update of letter that I wrote to her employer in 2023 requesting some accommodations including allowing her to work only on the 1st and 2nd floor due to neuropathy in her feet and legs, allowing her to use the restroom when she needs to and that she be allowed to work no more than 40 hours a week. - GAD-7 and PHQ-9 scores elevated today.  She denies to be any thoughts of wanting to harm herself.  HTN/CHF/NICM: She forgot to bring her medicines with her today.  She should be on hydralazine  75 mg 3 times a day, carvedilol  25 mg twice a day, isosorbide  30 mg daily, spironolactone  25 mg daily and furosemide  40 mg half a tablet daily.  She thinks she has been out of carvedilol  and isosorbide  for about a week.  Checks blood pressure about 3 times a month and reports that it has been good but she cannot recall specifics.   -reports her heart 'racing' and occasional headaches. She has not seen her cardiologist since September of last year and is concerned about her heart health. She experiences occasional shortness of breath and uses an inhaler for relief. No swelling in the legs or shortness of breath when lying down.  DM: Results for orders placed or performed in visit on 10/03/23  POCT glucose (manual entry)   Collection Time: 10/03/23  1:48 PM  Result Value Ref Range   POC Glucose 87 70 -  99 mg/dl  POCT glycosylated hemoglobin (Hb A1C)   Collection Time: 10/03/23  1:58 PM  Result Value Ref Range   Hemoglobin A1C     HbA1c POC (<> result, manual entry)     HbA1c, POC (prediabetic range)     HbA1c, POC (controlled diabetic range)    POCT glycosylated hemoglobin (Hb A1C)   Collection Time: 10/03/23  2:00 PM  Result Value Ref Range   Hemoglobin A1C 7.5 (A) 4.0 - 5.6 %   HbA1c POC (<> result, manual entry)     HbA1c, POC (prediabetic range)     HbA1c, POC (controlled diabetic  range)    -she takes Jardiance  10 mg daily, metformin  500 mg daily, Lantus  insulin  40 units daily, and Trulicity  1.5 mg weekly. She has been out of Trulicity  for two weeks due to pharmacy stock issues. Her blood sugar checks are infrequent, about twice a month, and her A1c has increased from 6.7 to 7.5 since her last visit.  HL: She should be on rosuvastatin  and Zetia  for cholesterol and she thinks she is taking both.  Lipid profile done in May of this year by neurology found LDL level to be 161.  At that time she had told his nurse that she did not have the Zetia .  From looking at her medication list at the last time prescriptions were written, she should be out of the rosuvastatin .   Saw the neurologist Dr. Shona for memory issues.  He felt that her underlying behavioral health diagnoses are playing a major role.  MRI of the head showed no new lesions.    Patient Active Problem List   Diagnosis Date Noted   New cerebellar infarct (HCC) 12/11/2022   Chronic HFrEF (heart failure with reduced ejection fraction) (HCC) 12/11/2022   Insulin  dependent type 2 diabetes mellitus (HCC) 12/11/2022   NPDR (nonproliferative diabetic retinopathy) (HCC) 10/04/2021   Acute cough 04/03/2021   Upper respiratory tract infection 04/03/2021   Anxiety and depression 01/17/2021   Sinus tachycardia 10/15/2018   Vitamin D  deficiency 03/26/2018   Homeless 03/24/2018   Microalbuminuria 08/11/2017   Anemia 08/08/2017   Physical deconditioning    Hyperlipidemia 02/21/2017   Diabetic polyneuropathy associated with type 2 diabetes mellitus (HCC) 02/21/2017   Lung nodules 07/16/2016   Uncontrolled type 2 diabetes mellitus without complication, with long-term current use of insulin  07/16/2016   DCM (dilated cardiomyopathy) (HCC)    Hypertension associated with diabetes (HCC) 06/10/2016   Dyslipidemia associated with type 2 diabetes mellitus (HCC) 06/10/2016     Current Outpatient Medications on File Prior to Visit   Medication Sig Dispense Refill   spironolactone  (ALDACTONE ) 25 MG tablet TAKE 1 TABLET(25 MG) BY MOUTH DAILY 90 tablet 1   albuterol  (VENTOLIN  HFA) 108 (90 Base) MCG/ACT inhaler Inhale 2 puffs into the lungs every 6 (six) hours as needed for wheezing or shortness of breath. 18 g 6   aspirin  81 MG chewable tablet Chew 1 tablet (81 mg total) by mouth daily. 30 tablet 3   busPIRone  (BUSPAR ) 5 MG tablet Take 1 tablet (5 mg total) by mouth 2 (two) times daily. 60 tablet 0   Continuous Blood Gluc Transmit (DEXCOM G6 TRANSMITTER) MISC 1 packet by Does not apply route daily. (Patient not taking: Reported on 07/11/2023) 1 each 4   empagliflozin  (JARDIANCE ) 10 MG TABS tablet Take 1 tablet (10 mg total) by mouth daily before breakfast. 90 tablet 1   gabapentin  (NEURONTIN ) 100 MG capsule Take 2 capsules (200 mg  total) by mouth at bedtime. For diabetic neuropathy symptoms 180 capsule 3   hydrALAZINE  (APRESOLINE ) 50 MG tablet Take 1.5 tablets (75 mg total) by mouth 3 (three) times daily. 135 tablet 6   pantoprazole  (PROTONIX ) 40 MG tablet Take 1 tablet (40 mg total) by mouth daily. 60 tablet 1   Study - OCEANIC-STROKE - asundexian 50 mg or placebo tablet (PI-Sethi) Take 1 tablet (50 mg total) by mouth daily. For Investigational Only. Take 1 tablet by mouth once daily at the same time each day, preferably in the morning. Please bring bottle to every visit. 196 tablet 0   Vitamin D , Ergocalciferol , (DRISDOL ) 1.25 MG (50000 UNIT) CAPS capsule Take 1 capsule (50,000 Units total) by mouth every 7 (seven) days. 12 capsule 1   No current facility-administered medications on file prior to visit.    Allergies  Allergen Reactions   Lisinopril  Hives and Swelling    Facial    Sertraline  Hives and Swelling    Facial    Tramadol  Hives and Swelling    facial   Ibuprofen Hives and Swelling    Social History   Socioeconomic History   Marital status: Single    Spouse name: Not on file   Number of children: 2    Years of education: Not on file   Highest education level: Associate degree: academic program  Occupational History   Not on file  Tobacco Use   Smoking status: Never   Smokeless tobacco: Never  Vaping Use   Vaping status: Never Used  Substance and Sexual Activity   Alcohol use: Yes    Comment: occ wine   Drug use: No   Sexual activity: Yes    Birth control/protection: None  Other Topics Concern   Not on file  Social History Narrative   Lives in [place].  Presently unemployed but attends school full time   Are you right handed or left handed? Right    Are you currently employed ?    What is your current occupation? [employer]   Do you live at home alone?yes   Who lives with you?    What type of home do you live in: 1 story or 2 story? one   Caffiene 1 soda a day    Social Drivers of Corporate Investment Banker Strain: Medium Risk (06/06/2023)   Overall Financial Resource Strain (CARDIA)    Difficulty of Paying Living Expenses: Somewhat hard  Food Insecurity: No Food Insecurity (06/06/2023)   Hunger Vital Sign    Worried About Running Out of Food in the Last Year: Never true    Ran Out of Food in the Last Year: Never true  Transportation Needs: No Transportation Needs (06/06/2023)   PRAPARE - Administrator, Civil Service (Medical): No    Lack of Transportation (Non-Medical): No  Physical Activity: Insufficiently Active (06/06/2023)   Exercise Vital Sign    Days of Exercise per Week: 3 days    Minutes of Exercise per Session: 10 min  Stress: No Stress Concern Present (06/06/2023)   [employer] of Occupational Health - Occupational Stress Questionnaire    Feeling of Stress : Not at all  Social Connections: Moderately Integrated (06/06/2023)   Social Connection and Isolation Panel    Frequency of Communication with Friends and Family: More than three times a week    Frequency of Social Gatherings with Friends and Family: More than three times a week     Attends Religious Services:  More than 4 times per year    Active Member of Clubs or Organizations: Yes    Attends Banker Meetings: More than 4 times per year    Marital Status: Divorced  Intimate Partner Violence: Not At Risk (06/06/2023)   Humiliation, Afraid, Rape, and Kick questionnaire    Fear of Current or Ex-Partner: No    Emotionally Abused: No    Physically Abused: No    Sexually Abused: No    Family History  Problem Relation Age of Onset   Diabetes Other        multiple   Heart failure Paternal Grandmother    Breast cancer Paternal Grandmother    Heart disease Other        multiple   Breast cancer Mother     Past Surgical History:  Procedure Laterality Date   BREAST BIOPSY Left 05/24/2022   MM LT BREAST BX W LOC DEV 1ST LESION IMAGE BX SPEC STEREO GUIDE 05/24/2022 [facility]   CERVIX LESION DESTRUCTION     DILATION AND CURETTAGE OF UTERUS     LEFT HEART CATHETERIZATION WITH CORONARY ANGIOGRAM N/A 05/21/2011   Procedure: LEFT HEART CATHETERIZATION WITH CORONARY ANGIOGRAM;  Surgeon: Ozell Fell, MD;  Location: [facility] CATH LAB;  Service: Cardiovascular;  Laterality: N/A;   TUBAL LIGATION  1996    ROS: Review of Systems Negative except as stated above  PHYSICAL EXAM: BP (!) 152/74   Pulse 80   Temp 98 F (36.7 C)   Ht 5' 9 (1.753 m)   Wt 208 lb (94.3 kg)   SpO2 98%   BMI 30.72 kg/m   Physical Exam  General appearance - alert, well appearing middle-age African-American female, and in no distress Mental status -patient is tearful at times. Mouth - mucous membranes moist, pharynx normal without lesions Neck - supple, no significant adenopathy Chest - clear to auscultation, no wheezes, rales or rhonchi, symmetric air entry Heart -regular rate and rhythm. Neurological -cranial nerves were grossly intact.  Grip 5/5 bilaterally.  Power in both upper and lower extremities 5/5 proximally and distally.  Gross sensation in the extremities normal.   She had mild sway of the trunk Romberg.  Gait was normal with good foot floor clearance. Extremities -no lower extremity edema Diabetic Foot Exam - Simple   Simple Foot Form  10/03/2023  3:35 PM  Visual Inspection No deformities, no ulcerations, no other skin breakdown bilaterally: Yes Sensation Testing Intact to touch and monofilament testing bilaterally: Yes Pulse Check Posterior Tibialis and Dorsalis pulse intact bilaterally: Yes Comments        10/03/2023    1:54 PM 06/11/2023    5:36 PM 06/06/2023    3:19 PM  Depression screen PHQ 2/9  Decreased Interest 2 1 0  Down, Depressed, Hopeless 2 2 0  PHQ - 2 Score 4 3 0  Altered sleeping 3 1 0  Tired, decreased energy 3 0 0  Change in appetite 3 1 0  Feeling bad or failure about yourself  3 1 0  Trouble concentrating  0 0  Moving slowly or fidgety/restless 3 2 0  Suicidal thoughts 3 0 0  PHQ-9 Score 22 8 0  Difficult doing work/chores Very difficult Somewhat difficult Not difficult at all      10/03/2023    1:55 PM 06/11/2023    5:34 PM 06/06/2023    3:19 PM 03/07/2023    3:47 PM  GAD 7 : Generalized Anxiety Score  Nervous, Anxious, on Edge  3 0 0 0  Control/stop worrying 3 3 0 0  Worry too much - different things 3 3 0 0  Trouble relaxing 3 2 0 0  Restless 3 1 0 0  Easily annoyed or irritable 3 2 0 0  Afraid - awful might happen 3 1 0 0  Total GAD 7 Score 21 12 0 0  Anxiety Difficulty Extremely difficult Very difficult Not difficult at all Not difficult at all        Latest Ref Rng & Units 03/12/2023    3:15 PM 12/24/2022    2:50 PM 12/12/2022    4:42 AM  CMP  Glucose 70 - 99 mg/dL 836  800  799   BUN 6 - 24 mg/dL 21  19  15    Creatinine 0.57 - 1.00 mg/dL 8.94  8.75  9.05   Sodium 134 - 144 mmol/L 141  138  136   Potassium 3.5 - 5.2 mmol/L 3.8  3.9  3.1   Chloride 96 - 106 mmol/L 103  98  101   CO2 20 - 29 mmol/L 23  26  26    Calcium  8.7 - 10.2 mg/dL 9.5  89.8  8.8    Lipid Panel     Component Value  Date/Time   CHOL 241 (H) 07/11/2023 1615   CHOL 123 10/04/2022 1636   TRIG 111 07/11/2023 1615   HDL 55 07/11/2023 1615   HDL 40 10/04/2022 1636   CHOLHDL 4.4 07/11/2023 1615   VLDL 24 12/12/2022 0442   LDLCALC 163 (H) 07/11/2023 1615    CBC    Component Value Date/Time   WBC 5.0 12/24/2022 1450   WBC 6.7 12/12/2022 0442   RBC 4.91 12/24/2022 1450   RBC 3.96 12/12/2022 0442   HGB 13.5 12/24/2022 1450   HCT 43.0 12/24/2022 1450   PLT 340 12/24/2022 1450   MCV 88 12/24/2022 1450   MCH 27.5 12/24/2022 1450   MCH 27.3 12/12/2022 0442   MCHC 31.4 (L) 12/24/2022 1450   MCHC 31.7 12/12/2022 0442   RDW 13.5 12/24/2022 1450   LYMPHSABS 2.5 07/31/2017 1225   MONOABS 0.6 06/02/2017 0530   EOSABS 0.2 07/31/2017 1225   BASOSABS 0.1 07/31/2017 1225    ASSESSMENT AND PLAN: 1. Type 2 diabetes mellitus with diabetic polyneuropathy, with long-term current use of insulin  (HCC) (Primary) A1c slightly above goal.  She has been out of Trulicity  for 2 weeks due to shortages at her pharmacy.  She is agreeable to trying to get this at our pharmacy instead today.  Prescription sent.  She will continue Lantus  40 units daily, Jardiance  10 mg and metformin  500 mg daily. -I recommend getting CGM but patient declined stating that she prefers to stick herself.  Encouraged her to check blood sugars at least once a day. -Discussed adding low-dose of Cymbalta  to help with neuropathy symptoms since she can only tolerate low-dose of gabapentin .  Advised that it will also help with depression and anxiety.  Patient willing to try the medication.  - POCT glycosylated hemoglobin (Hb A1C) - POCT glucose (manual entry) - POCT glycosylated hemoglobin (Hb A1C) - DULoxetine  (CYMBALTA ) 20 MG capsule; Take 1 capsule (20 mg total) by mouth daily.  Dispense: 30 capsule; Refill: 3 - metFORMIN  (GLUCOPHAGE -XR) 500 MG 24 hr tablet; Take 1 tablet (500 mg total) by mouth daily with breakfast.  Dispense: 90 tablet; Refill: 1 -  LANTUS  SOLOSTAR 100 UNIT/ML Solostar Pen; Inject 40 Units into the skin daily.  Dispense: 45  mL; Refill: 6 - Dulaglutide  (TRULICITY ) 1.5 MG/0.5ML SOAJ; Inject 1.5 mg into the skin once a week.  Dispense: 2 mL; Refill: 6  2. Long-term (current) use of injectable non-insulin  antidiabetic drugs 3. Diabetes mellitus treated with oral medication (HCC) See #1 above.  4. Hypertension associated with diabetes (HCC) Not at goal.  She has been out of isosorbide  and carvedilol .  Refills sent.  Continue other medications as listed above - isosorbide  mononitrate (IMDUR ) 30 MG 24 hr tablet; Take 1 tablet (30 mg total) by mouth daily.  Dispense: 90 tablet; Refill: 1 - carvedilol  (COREG ) 25 MG tablet; Take 1 tablet (25 mg total) by mouth 2 (two) times daily with a meal.  Dispense: 180 tablet; Refill: 1  5. Chronic combined systolic and diastolic CHF, NYHA class 1 (HCC) Compensated. Continue isosorbide , furosemide , carvedilol , spironolactone  and hydralazine  - isosorbide  mononitrate (IMDUR ) 30 MG 24 hr tablet; Take 1 tablet (30 mg total) by mouth daily.  Dispense: 90 tablet; Refill: 1 - furosemide  (LASIX ) 40 MG tablet; Take 1 tablet (40 mg total) by mouth daily.  Dispense: 90 tablet; Refill: 1 - carvedilol  (COREG ) 25 MG tablet; Take 1 tablet (25 mg total) by mouth 2 (two) times daily with a meal.  Dispense: 180 tablet; Refill: 1 - Ambulatory referral to Cardiology  6. Hyperlipidemia, unspecified hyperlipidemia type Last LDL was not at goal.  I suspect that she has been out of the Crestor  and Zetia .  Patient has poor recall.  Will send both prescriptions to her pharmacy - rosuvastatin  (CRESTOR ) 40 MG tablet; Take 1 tablet (40 mg total) by mouth daily.  Dispense: 90 tablet; Refill: 1 - ezetimibe  (ZETIA ) 10 MG tablet; Take 1 tablet (10 mg total) by mouth daily.  Dispense: 90 tablet; Refill: 2  7. History of recent fall Neuropathy likely playing a role.  However gait today appears stable she has good power in  the lower extremities.  Discussed referral to physical therapy for gait safety training but patient decided to hold off on at this due to financial restraints  8. Major depressive disorder, severe (HCC) Patient feels she would benefit from more therapy but cannot afford the co-pay.  Will send referral to the [facility] to see if they can assist.  We also discussed starting low-dose of Cymbalta  to help with both neuropathy symptoms and depression/anxiety.  Patient is agreeable to this.  She denies any suicidal thoughts at this time.  If she develops thoughts of self-harm, advised to be seen in the emergency room. - DULoxetine  (CYMBALTA ) 20 MG capsule; Take 1 capsule (20 mg total) by mouth daily.  Dispense: 30 capsule; Refill: 3 - AMB Referral [facility] Care Management  9. GAD (generalized anxiety disorder) See #8 above. - DULoxetine  (CYMBALTA ) 20 MG capsule; Take 1 capsule (20 mg total) by mouth daily.  Dispense: 30 capsule; Refill: 3 - AMB Referral [facility] Care Management  10. Financial difficulties Patient will try applying for Medicaid but I do not know that she would qualify given that she does have private insurance. Patient was given the opportunity to ask questions.  Patient verbalized understanding of the plan and was able to repeat key elements of the plan.   This documentation was completed using Paediatric nurse.  Any transcriptional errors are unintentional.  Orders Placed This Encounter  Procedures   Ambulatory referral to Cardiology   AMB Referral [facility] Care Management   POCT glycosylated hemoglobin (Hb A1C)   POCT glucose (manual entry)   POCT glycosylated hemoglobin (Hb  A1C)     Requested Prescriptions   Signed Prescriptions Disp Refills   DULoxetine  (CYMBALTA ) 20 MG capsule 30 capsule 3    Sig: Take 1 capsule (20 mg total) by mouth daily.   isosorbide  mononitrate (IMDUR ) 30 MG 24 hr tablet 90 tablet 1    Sig: Take 1 tablet (30 mg total) by mouth daily.    furosemide  (LASIX ) 40 MG tablet 90 tablet 1    Sig: Take 1 tablet (40 mg total) by mouth daily.   carvedilol  (COREG ) 25 MG tablet 180 tablet 1    Sig: Take 1 tablet (25 mg total) by mouth 2 (two) times daily with a meal.   metFORMIN  (GLUCOPHAGE -XR) 500 MG 24 hr tablet 90 tablet 1    Sig: Take 1 tablet (500 mg total) by mouth daily with breakfast.   LANTUS  SOLOSTAR 100 UNIT/ML Solostar Pen 45 mL 6    Sig: Inject 40 Units into the skin daily.   Dulaglutide  (TRULICITY ) 1.5 MG/0.5ML SOAJ 2 mL 6    Sig: Inject 1.5 mg into the skin once a week.   rosuvastatin  (CRESTOR ) 40 MG tablet 90 tablet 1    Sig: Take 1 tablet (40 mg total) by mouth daily.   ezetimibe  (ZETIA ) 10 MG tablet 90 tablet 2    Sig: Take 1 tablet (10 mg total) by mouth daily.    Return in about 3 months (around 01/03/2024).  Barnie Louder, MD, FACP "

## 2023-10-04 ENCOUNTER — Telehealth: Payer: Self-pay | Admitting: Licensed Clinical Social Worker

## 2023-10-04 ENCOUNTER — Encounter: Payer: Self-pay | Admitting: Internal Medicine

## 2023-10-04 NOTE — Patient Outreach (Signed)
 LCSW completed telephone outreach to pt. Explained role in Complex Care Management and informed her of an emergent referral placed by PCP. An initial telephone appt scheduled for 08/27.

## 2023-10-08 ENCOUNTER — Ambulatory Visit: Payer: PRIVATE HEALTH INSURANCE | Admitting: Internal Medicine

## 2023-10-09 ENCOUNTER — Other Ambulatory Visit: Payer: PRIVATE HEALTH INSURANCE | Admitting: Licensed Clinical Social Worker

## 2023-10-09 DIAGNOSIS — E119 Type 2 diabetes mellitus without complications: Secondary | ICD-10-CM

## 2023-10-09 DIAGNOSIS — E1159 Type 2 diabetes mellitus with other circulatory complications: Secondary | ICD-10-CM

## 2023-10-09 DIAGNOSIS — E1142 Type 2 diabetes mellitus with diabetic polyneuropathy: Secondary | ICD-10-CM

## 2023-10-09 DIAGNOSIS — I5022 Chronic systolic (congestive) heart failure: Secondary | ICD-10-CM

## 2023-10-09 DIAGNOSIS — F419 Anxiety disorder, unspecified: Secondary | ICD-10-CM

## 2023-10-09 NOTE — Patient Instructions (Addendum)
 Visit Information  Thank you for taking time to visit with me today. Please don't hesitate to contact me if I can be of assistance to you before our next scheduled appointment.  Our next appointment is by telephone on 09/03 at 3:30 PM Please call the care guide team at [phone] if you need to cancel or reschedule your appointment.   Following is a copy of your care plan:   Goals Addressed             This Visit's Progress    LCSW [facility] Social Work Care Plan   On track    Problems:   Disease Management support and education needs related to Depression: anxiety  CSW Clinical Goal(s):   Over the next 90 days the Patient will attend all scheduled medical appointments as evidenced by patient report and care team review of appointment completion in electronic MEDICAL RECORD NUMBER  demonstrate a reduction in symptoms related to Depression: anxiety .  Interventions:  Mental Health:  Evaluation of current treatment plan related to Depression: anxiety Active listening / Reflection utilized Depression screen reviewed Emotional Support Provided Mindfulness or Relaxation training provided Participation in counseling encourage : Patient was participating in therapy through [facility]; however, discontinued due to inability to afford co-pays. Pt has a current outstanding balance Participation in support group encouraged : LCSW informed pt of support groups that may be free or at a lower cost  Provided general psycho-education for mental health needs Reviewed mental health medications and discussed importance of compliance: Patient has yet to fill Cymbalta  prescribed by PCP on 08/21 due to financial strain. AMB referral to Pharmacy completed to strengthen support Solution-Focued Strategies employed: Suicidal Ideation/Homicidal Ideation assessed: Pt denies current SI/HI LCSW completed AMB referrals to Care Guide, RNCM, Pharmacy f/up   Patient Goals/Self-Care Activities:  Continue taking  your medication as prescribed.   Increase coping skills, self-management skills, and stress reduction  Patient agreed to continue utilizing coping skills of walking, reading, and journaling to assist with management of anxiety and depression symptoms  Plan:   Telephone follow up appointment with care management team member scheduled for:  1 week on 09/03        Please call the Suicide and Crisis Lifeline: 988 call the [place]  National Suicide Prevention Lifeline: 475 294 3780 or TTY: 830 009 4277 TTY 210-310-0871) to talk to a trained counselor go to [facility] [address], [place] [phone]) call 911 if you are experiencing a Mental Health or Behavioral Health Crisis or need someone to talk to.  Patient verbalizes understanding of instructions and care plan provided today and agrees to view in MyChart. Active MyChart status and patient understanding of how to access instructions and care plan via MyChart confirmed with patient.     Rolin Kerns, LCSW [place]  [facility], [facility] Clinical Social Worker Direct Dial : [phone]  Fax: [phone] Website: [email or web address] 4:55 PM

## 2023-10-09 NOTE — Patient Outreach (Addendum)
 Complex Care Management   Visit Note  10/09/2023  Name:  [name] MRN: [id number] DOB: 1965/03/08  Situation: Referral received for Complex Care Management related to Mental/Behavioral Health diagnosis Depression and Anxiety I obtained verbal consent from Patient.  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, now resolved   Systolic CHF (HCC)    Echo 05/19/11 EF 35% w/ mild-mod MR    Assessment: Patient Reported Symptoms:  Cognitive Cognitive Status: Alert and oriented to person, place, and time, Normal speech and language skills Cognitive/Intellectual Conditions Management [RPT]: None reported or documented in medical history or problem list   Health Maintenance Behaviors: Annual physical exam, Exercise  Neurological Neurological Review of Symptoms: Not assessed    HEENT HEENT Symptoms Reported: Not assessed      Cardiovascular Cardiovascular Symptoms Reported: Not assessed    Respiratory Respiratory Symptoms Reported: Not assesed    Endocrine Endocrine Symptoms Reported: Not assessed Is patient diabetic?: Yes    Gastrointestinal Gastrointestinal Symptoms Reported: Not assessed      Genitourinary Genitourinary Symptoms Reported: Not assessed    Integumentary Integumentary Symptoms Reported: Not assessed    Musculoskeletal  Musculoskelatal Symptoms Reviewed: Not assessed        Psychosocial Psychosocial Symptoms Reported: Anxiety - if selected complete GAD, Depression - if selected complete PHQ 2-9, Report of significant loss, deaths, abandonment, traumatic incidents Behavioral Management Strategies: Coping strategies, Exercise, Medication therapy Major Change/Loss/Stressor/Fears (CP): Medical condition, self, Resources, School or job, Traumatic event Behaviors When Feeling Stressed/Fearful: Withdraw Techniques to [employer] with Loss/Stress/Change: Diversional activities, Exercise, Medication Quality of Family Relationships: involved Do you feel physically threatened by others?: No    10/09/2023    PHQ2-9 Depression Screening   Little interest or pleasure in doing things    Feeling down, depressed, or hopeless    PHQ-2 - Total Score    Trouble falling or staying asleep, or sleeping too much    Feeling tired or having little energy    Poor appetite or overeating     Feeling bad about yourself - or that you are a failure or have let yourself or your family down    Trouble concentrating on things, such as reading the newspaper or watching television    Moving or speaking so slowly that other people could have noticed.  Or the opposite - being so fidgety or restless that you have been moving around a lot more than usual    Thoughts that you would be better off dead, or hurting yourself in some way    PHQ2-9 Total Score    If you checked off any problems, how difficult have these problems made it for you to do your work, take care of things at home, or get along with other people    Depression Interventions/Treatment      There  were no vitals filed for this visit.  Medications Reviewed Today   Medications were not reviewed in this encounter     Recommendation:   Continue Current Plan of Care  Follow Up Plan:   Telephone follow-up in 1 week  Rolin Kerns, LCSW [place]  [facility],  [facility] Clinical Social Worker Direct Dial : [phone]  Fax: [phone] Website: [email or web address] 4:55 PM

## 2023-10-15 ENCOUNTER — Telehealth: Payer: Self-pay | Admitting: *Deleted

## 2023-10-15 NOTE — Progress Notes (Unsigned)
 Complex Care Management Care Guide Note  10/15/2023 Name: Mikena Masoner MRN: [id number] DOB: [birth year]  Laquana Villari is a 58 y.o. year old female who is a primary care patient of Vicci Barnie NOVAK, MD and is actively engaged with the care management team. I reached out to Erminio Hummer by phone today to assist with scheduling  with the RN Case Manager.  Follow up plan: Unsuccessful telephone outreach attempt made. A HIPAA compliant phone message was left for the patient providing contact information and requesting a return call.  Harlene Satterfield  [facility]  Value-Based Care Institute, [facility] Guide  Direct Dial : [phone]  Fax [phone]

## 2023-10-16 ENCOUNTER — Other Ambulatory Visit: Payer: PRIVATE HEALTH INSURANCE | Admitting: Licensed Clinical Social Worker

## 2023-10-16 DIAGNOSIS — E1169 Type 2 diabetes mellitus with other specified complication: Secondary | ICD-10-CM

## 2023-10-17 NOTE — Patient Outreach (Signed)
 Complex Care Management   Visit Note  10/16/2023  Name:  [name] MRN: [id number] DOB: Aug 19, 1965  Situation: Referral received for Complex Care Management related to Mental/Behavioral Health diagnosis Anxiety and Depression I obtained verbal consent from Patient.  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  HEENT HEENT Symptoms Reported: No symptoms reported      Cardiovascular Cardiovascular Symptoms Reported: Palpitations Does patient have uncontrolled Hypertension?: No Cardiovascular Management Strategies: Coping strategies, Adequate rest, Medication therapy Cardiovascular Comment: Pt endorses symptoms triggered by daily construction at work site  Respiratory Respiratory Symptoms  Reported: Shortness of breath Additional Respiratory Details: Pt endorses symptoms triggered by daily construction at work site Respiratory Management Strategies: Medication therapy, Adequate rest, Coping strategies  Endocrine Endocrine Symptoms Reported: Fast heartbeat Is patient diabetic?: Yes Is patient checking blood sugars at home?: Yes    Gastrointestinal Gastrointestinal Symptoms Reported: No symptoms reported      Genitourinary Genitourinary Symptoms Reported: Urgency, Frequency Additional Genitourinary Details: Patient reports increase in having to use the restroom during the night, impacting sleep quality Genitourinary Management Strategies: Coping strategies  Integumentary Integumentary Symptoms Reported: No symptoms reported    Musculoskeletal Musculoskelatal Symptoms Reviewed: Difficulty walking, Limited mobility, Back pain Additional Musculoskeletal Details: Pt's employment requires a lot of physical hardship Musculoskeletal Management Strategies: Adequate rest, Coping strategies, Medication therapy, Routine screening Musculoskeletal Self-Management Outcome: 2 (bad) Falls in the past year?: Yes Number of falls in past year: 2 or more (One fall at work and one at home) Was there an injury with Fall?: No (Pt was sore) Fall Risk Category Calculator: 2 Patient Fall Risk Level: Moderate Fall Risk Patient at Risk for Falls Due to: Impaired mobility Fall risk Follow up: Falls prevention discussed  Psychosocial Psychosocial Symptoms Reported: Anxiety - if selected complete GAD, Report of significant loss, deaths, abandonment, traumatic incidents Behavioral Management Strategies: Coping strategies, Exercise, Medication therapy Major Change/Loss/Stressor/Fears (CP): Medical condition, self, Resources, School or job, Traumatic event Behaviors When Feeling Stressed/Fearful: Withdraw Techniques to [employer] with Loss/Stress/Change: Diversional activities, Exercise, Medication Quality of  Family Relationships: involved    10/17/2023    PHQ2-9 Depression Screening   Little interest or pleasure in doing things    Feeling down, depressed, or hopeless    PHQ-2 - Total Score    Trouble falling or staying asleep,  or sleeping too much    Feeling tired or having little energy    Poor appetite or overeating     Feeling bad about yourself - or that you are a failure or have let yourself or your family down    Trouble concentrating on things, such as reading the newspaper or watching television    Moving or speaking so slowly that other people could have noticed.  Or the opposite - being so fidgety or restless that you have been moving around a lot more than usual    Thoughts that you would be better off dead, or hurting yourself in some way    PHQ2-9 Total Score    If you checked off any problems, how difficult have these problems made it for you to do your work, take care of things at home, or get along with other people    Depression Interventions/Treatment      There were no vitals filed for this visit.  Medications Reviewed Today     Reviewed by Ezzard Rolin BIRCH, LCSW (Social Worker) on 10/16/23 at 1547  Med List Status: <None>   Medication Order Taking? Sig Documenting Provider Last Dose Status Informant  albuterol  (VENTOLIN  HFA) 108 (90 Base) MCG/ACT inhaler [id number] Yes Inhale 2 puffs into the lungs every 6 (six) hours as needed for wheezing or shortness of breath. Vicci Barnie NOVAK, MD  Active Self  aspirin  81 MG chewable tablet [id number] Yes Chew 1 tablet (81 mg total) by mouth daily. Pearlean Manus, MD  Active Self  busPIRone  (BUSPAR ) 5 MG tablet [id number] Yes Take 1 tablet (5 mg total) by mouth 2 (two) times daily. Vicci Barnie NOVAK, MD  Active   carvedilol  (COREG ) 25 MG tablet [id number] Yes Take 1 tablet (25 mg total) by mouth 2 (two) times daily with a meal. Vicci Barnie NOVAK, MD  Active   Continuous Blood Gluc Transmit (DEXCOM G6 TRANSMITTER) MISC [id number]  1  packet by Does not apply route daily.  Patient not taking: Reported on 10/16/2023   Vicci Barnie NOVAK, MD  Active Self  Dulaglutide  (TRULICITY ) 1.5 MG/0.5ML EMMANUEL [id number] Yes Inject 1.5 mg into the skin once a week. Vicci Barnie NOVAK, MD  Active   DULoxetine  (CYMBALTA ) 20 MG capsule [id number]  Take 1 capsule (20 mg total) by mouth daily.  Patient not taking: Reported on 10/16/2023   Vicci Barnie NOVAK, MD  Active   empagliflozin  (JARDIANCE ) 10 MG TABS tablet [id number] Yes Take 1 tablet (10 mg total) by mouth daily before breakfast. Vicci Barnie NOVAK, MD  Active   ezetimibe  (ZETIA ) 10 MG tablet [id number] Yes Take 1 tablet (10 mg total) by mouth daily. Vicci Barnie NOVAK, MD  Active   furosemide  (LASIX ) 40 MG tablet [id number] Yes Take 1 tablet (40 mg total) by mouth daily. Vicci Barnie NOVAK, MD  Active   gabapentin  (NEURONTIN ) 100 MG capsule [id number] Yes Take 2 capsules (200 mg total) by mouth at bedtime. For diabetic neuropathy symptoms Leigh Venetia CROME, MD  Active   hydrALAZINE  (APRESOLINE ) 50 MG tablet [id number] Yes Take 1.5 tablets (75 mg total) by mouth 3 (three) times daily. Vicci Barnie NOVAK, MD  Active   isosorbide  mononitrate (IMDUR ) 30 MG 24 hr tablet [id number] Yes Take 1 tablet (30 mg total) by mouth daily. Vicci Barnie NOVAK, MD  Active   LANTUS  SOLOSTAR 100 UNIT/ML Solostar Pen [id number] Yes Inject 40 Units into the skin daily. Vicci Barnie NOVAK, MD  Active   metFORMIN  (GLUCOPHAGE -XR) 500 MG  24 hr tablet [id number] Yes Take 1 tablet (500 mg total) by mouth daily with breakfast. Vicci Barnie NOVAK, MD  Active   pantoprazole  (PROTONIX ) 40 MG tablet [id number] Yes Take 1 tablet (40 mg total) by mouth daily. Vicci Barnie NOVAK, MD  Active   rosuvastatin  (CRESTOR ) 40 MG tablet [id number] Yes Take 1 tablet (40 mg total) by mouth daily. Vicci Barnie NOVAK, MD  Active   spironolactone  (ALDACTONE ) 25 MG tablet [id number] Yes TAKE 1 TABLET(25 MG) BY MOUTH DAILY Vicci Barnie NOVAK, MD  Active   Study  - OCEANIC-STROKE - asundexian 50 mg or placebo tablet (PI-Sethi) [id number]  Take 1 tablet (50 mg total) by mouth daily. For Investigational Only. Take 1 tablet by mouth once daily at the same time each day, preferably in the morning. Please bring bottle to every visit.  Patient not taking: Reported on 10/16/2023   Rosemarie Eather RAMAN, MD  Active   Vitamin D , Ergocalciferol , (DRISDOL ) 1.25 MG (50000 UNIT) CAPS capsule [id number] Yes Take 1 capsule (50,000 Units total) by mouth every 7 (seven) days. Vicci Barnie NOVAK, MD  Active             Recommendation:   Continue Current Plan of Care  Follow Up Plan:   Telephone follow-up in 1 month  Rolin Kerns, LCSW [facility]  [facility], [facility] Clinical Social Worker Direct Dial : [phone]  Fax: [phone] Website: [email or web address] 3:14 PM

## 2023-10-17 NOTE — Patient Instructions (Addendum)
 Visit Information  Thank you for taking time to visit with me today. Please don't hesitate to contact me if I can be of assistance to you before our next scheduled appointment.  Your next care management appointment is by telephone on 09/24 at 3:30 PM  Please call the care guide team at [phone] if you need to cancel, schedule, or reschedule an appointment.   Please call the Suicide and Crisis Lifeline: 988 go to [facility] [address] [place] [phone]) call 911 if you are experiencing a Mental Health or Behavioral Health Crisis or need someone to talk to.  Rolin Kerns, LCSW [place]  [facility], [facility] Clinical Social Worker Direct Dial : [phone]  Fax: [phone] Website: [email or web address] 3:14 PM

## 2023-10-17 NOTE — Progress Notes (Unsigned)
 Complex Care Management Care Guide Note  10/17/2023 Name: Salvador Bigbee MRN: [id number] DOB: 01/14/1966  Norleen Xie is a 58 y.o. year old female who is a primary care patient of Vicci Barnie NOVAK, MD and is actively engaged with the care management team. I reached out to Erminio Hummer by phone today to assist with scheduling  with the RN Case Manager Pharmacist. And routing PharmD referral to [facility]  Follow up plan: Unsuccessful telephone outreach attempt made. A HIPAA compliant phone message was left for the patient providing contact information and requesting a return call.  Harlene Satterfield  [facility]  Value-Based Care Institute, [facility] Guide  Direct Dial : [phone]  Fax [phone]

## 2023-10-18 NOTE — Progress Notes (Signed)
 Complex Care Management Note Care Guide Note  10/18/2023 Name: [name] MRN: [id number] DOB: [birth year]   Complex Care Management Outreach Attempts: A third unsuccessful outreach was attempted today to offer the patient with information about available complex care management services.  Follow Up Plan:  No further outreach attempts will be made at this time. We have been unable to contact the patient to offer or enroll patient in complex care management services.  Encounter Outcome:  No Answer  Harlene Satterfield  [facility]  [facility], [facility] Guide  Direct Dial : [phone]  Fax [phone]

## 2023-10-18 NOTE — Progress Notes (Signed)
 Complex Care Management Care Guide Note  10/18/2023 Name: [name] MRN: [id number] DOB: 09-Nov-1965  [name] is a 58 y.o. year old female who is a primary care patient of Vicci Barnie NOVAK, MD and is actively engaged with the care management team. I reached out to Erminio Hummer by phone today to assist with scheduling  with the RN Case Manager.  Follow up plan: Unsuccessful telephone outreach attempt made. A HIPAA compliant phone message was left for the patient providing contact information and requesting a return call.No further outreach attempts will be made at this time. We have been unable to contact the patient to reschedule for complex care management services.  Harlene Satterfield  [facility] Health  Value-Based Care Institute, [facility] Guide  Direct Dial : [phone]  Fax [phone]

## 2023-11-01 ENCOUNTER — Ambulatory Visit: Payer: PRIVATE HEALTH INSURANCE | Admitting: Nurse Practitioner

## 2023-11-01 NOTE — Progress Notes (Signed)
 HPI: FU CM/CHF. Echocardiogram in April of 2013 showed an ejection fraction of 35%, moderate left ventricular enlargement, mild to moderate mitral regurgitation. HIV negative and TSH normal. Cardiac catheterization in April of 2013 showed normal coronary arteries and an ejection fraction of 35-40%. Patient was treated with medications and her cardiomyopathy was felt possibly secondary to hypertension. Echocardiogram repeated in September of 2013. Her ejection fraction was 30-35%. Patient referred to Dr. Kelsie for consideration of ICD but the patient decided not to pursue. Also with h/o carotid dissection healed on CTA 4/18. Echocardiogram October 2024 showed ejection fraction 35 to 40%, mild left ventricular enlargement, mild right ventricular enlargement, moderate to severe mitral regurgitation (findings similar to June 2024).  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  Since last seen, she has dyspnea with more vigorous activities but not routine activities.  No orthopnea, PND, pedal edema, chest pain or syncope.  Current Outpatient Medications  Medication Sig Dispense Refill   albuterol  (VENTOLIN  HFA) 108 (90 Base) MCG/ACT inhaler Inhale 2 puffs into the lungs every 6 (six) hours as needed for wheezing or shortness of breath. 18 g 6   aspirin  81 MG chewable tablet Chew 1 tablet (81 mg total) by mouth daily. 30 tablet 3   carvedilol  (COREG ) 25 MG tablet Take 1 tablet (25 mg total) by mouth 2 (two) times daily with a meal. 180 tablet 1   Dulaglutide  (TRULICITY ) 1.5 MG/0.5ML SOAJ Inject 1.5 mg into the skin once a week. 2 mL 6   DULoxetine  (CYMBALTA ) 20 MG capsule Take 1 capsule (20 mg total) by mouth daily. 30 capsule 3   empagliflozin  (JARDIANCE ) 10 MG TABS tablet Take 1 tablet (10 mg total) by mouth daily before breakfast. 90 tablet 1   ezetimibe  (ZETIA ) 10 MG tablet Take 1 tablet (10 mg total) by mouth daily. 90 tablet 2   furosemide  (LASIX ) 40 MG tablet  Take 1 tablet (40 mg total) by mouth daily. 90 tablet 1   hydrALAZINE  (APRESOLINE ) 50 MG tablet Take 1.5 tablets (75 mg total) by mouth 3 (three) times daily. 135 tablet 6   isosorbide  mononitrate (IMDUR ) 30 MG 24 hr tablet Take 1 tablet (30 mg total) by mouth daily. 90 tablet 1   LANTUS  SOLOSTAR 100 UNIT/ML Solostar Pen Inject 40 Units into the skin daily. 45 mL 6   metFORMIN  (GLUCOPHAGE -XR) 500 MG 24 hr tablet Take 1 tablet (500 mg total) by mouth daily with breakfast. 90 tablet 1   rosuvastatin  (CRESTOR ) 40 MG tablet Take 1 tablet (40 mg total) by mouth daily. 90 tablet 1   spironolactone  (ALDACTONE ) 25 MG tablet TAKE 1 TABLET(25 MG) BY MOUTH DAILY 90 tablet 1   Vitamin D , Ergocalciferol , (DRISDOL ) 1.25 MG (50000 UNIT) CAPS capsule Take 1 capsule (50,000 Units total) by mouth every 7 (seven) days. 12 capsule 1   busPIRone  (BUSPAR ) 5 MG tablet Take 1 tablet (5 mg total) by mouth 2 (two) times daily. 60 tablet 0   Continuous Blood Gluc Transmit (DEXCOM G6 TRANSMITTER) MISC 1 packet by Does not apply route daily. (Patient not taking: Reported on 11/11/2023) 1 each 4   gabapentin  (NEURONTIN ) 100 MG capsule Take 2 capsules (200 mg total) by mouth at bedtime. For diabetic neuropathy symptoms (Patient not taking: Reported on 11/11/2023) 180 capsule 3   pantoprazole  (PROTONIX ) 40 MG tablet Take 1 tablet (40 mg total) by mouth daily. (Patient not taking: Reported on 11/11/2023) 60 tablet 1   Study -  OCEANIC-STROKE - asundexian 50 mg or placebo tablet (PI-Sethi) Take 1 tablet (50 mg total) by mouth daily. For Investigational Only. Take 1 tablet by mouth once daily at the same time each day, preferably in the morning. Please bring bottle to every visit. (Patient not taking: Reported on 11/11/2023) 196 tablet 0   No current facility-administered medications for this visit.     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, now resolved   Systolic CHF (HCC)    Echo 05/19/11 EF 35% w/ mild-mod MR    Past Surgical History:  Procedure Laterality Date   BREAST BIOPSY Left 05/24/2022   MM LT BREAST BX W LOC DEV 1ST LESION IMAGE BX SPEC STEREO GUIDE 05/24/2022 [facility]   CERVIX LESION DESTRUCTION     DILATION AND CURETTAGE OF UTERUS     LEFT HEART CATHETERIZATION WITH CORONARY ANGIOGRAM N/A 05/21/2011   Procedure: LEFT HEART CATHETERIZATION WITH CORONARY ANGIOGRAM;  Surgeon: Ozell Fell, MD;  Location: [facility] CATH LAB;  Service: Cardiovascular;  Laterality: N/A;   TUBAL LIGATION  1996    Social History   Socioeconomic History   Marital status: Single    Spouse name: Not on file   Number of children: 2   Years of education: Not on file   Highest education level: Associate degree: academic program  Occupational History   Not on file  Tobacco Use   Smoking status: Never   Smokeless tobacco: Never  Vaping Use   Vaping status: Never Used  Substance and Sexual Activity   Alcohol use: Yes    Comment: occ wine   Drug use: No   Sexual activity: Yes    Birth control/protection: None  Other Topics Concern   Not on file  Social History Narrative   Lives in [place].  Presently unemployed but attends school full time   Are you right handed or left handed? Right    Are you currently employed ?    What is your current occupation? [employer]   Do you live at home alone?yes   Who lives with you?    What type of  home do you live in: 1 story or 2 story? one   Caffiene 1 soda a day    Social Drivers of Corporate investment banker Strain: Medium Risk (06/06/2023)   Overall Financial Resource Strain (CARDIA)    Difficulty of Paying Living Expenses: Somewhat hard  Food Insecurity: Food Insecurity Present (10/09/2023)   Hunger Vital Sign    Worried About Running Out of Food in the Last Year: Often true    Ran Out of Food in the Last Year: Often true  Transportation Needs: No Transportation Needs (10/09/2023)   PRAPARE - Administrator, Civil Service (Medical): No    Lack of Transportation (Non-Medical): No  Physical Activity: Insufficiently Active (06/06/2023)   Exercise Vital Sign  Days of Exercise per Week: 3 days    Minutes of Exercise per Session: 10 min  Stress: No Stress Concern Present (06/06/2023)   [employer] of Occupational Health - Occupational Stress Questionnaire    Feeling of Stress : Not at all  Social Connections: Moderately Integrated (06/06/2023)   Social Connection and Isolation Panel    Frequency of Communication with Friends and Family: More than three times a week    Frequency of Social Gatherings with Friends and Family: More than three times a week    Attends Religious Services: More than 4 times per year    Active Member of [employer] or Organizations: Yes    Attends Engineer, structural: More than 4 times per year    Marital Status: Divorced  Intimate Partner Violence: Not At Risk (10/09/2023)   Humiliation, Afraid, Rape, and Kick questionnaire    Fear of Current or Ex-Partner: No    Emotionally Abused: No    Physically Abused: No    Sexually Abused: No    Family History  Problem Relation Age of Onset   Diabetes Other        multiple   Heart failure Paternal Grandmother    Breast cancer Paternal Grandmother    Heart disease Other        multiple   Breast cancer Mother     ROS: no fevers or chills, productive cough, hemoptysis, dysphasia,  odynophagia, melena, hematochezia, dysuria, hematuria, rash, seizure activity, orthopnea, PND, pedal edema, claudication. Remaining systems are negative.  Physical Exam: Well-developed well-nourished in no acute distress.  Skin is warm and dry.  HEENT is normal.  Neck is supple.  Chest is clear to auscultation with normal expansion.  Cardiovascular exam is regular rate and rhythm.  Abdominal exam nontender or distended. No masses palpated. Extremities show no edema. neuro grossly intact  EKG Interpretation Date/Time:  Monday November 11 2023 14:34:48 EDT Ventricular Rate:  76 PR Interval:  154 QRS Duration:  82 QT Interval:  426 QTC Calculation: 479 R Axis:   39  Text Interpretation: Normal sinus rhythm Nonspecific ST and T wave abnormality Prolonged QT Confirmed by Pietro Rogue ([id number]) on 11/11/2023 2:38:01 PM    A/P  1 nonischemic cardiomyopathy-patient had previous angioedema with ACE inhibitors.  Continue hydralazine /nitrates and carvedilol .  Plan cardiac MRI to assess LV function, rule out infiltrative process and reassess mitral regurgitation.  2 chronic combined systolic/diastolic congestive heart failure-she is euvolemic on examination.  Continue Lasix , Jardiance  and spironolactone  at present dose.  Check potassium and renal function.  3 mitral regurgitation-moderate to severe on most recent echocardiogram.  Plan to follow-up MRI as outlined above.  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  Continue present medical regimen.  6 hyperlipidemia-continue Zetia  and Crestor .  Check lipids and liver.  Rogue Pietro, MD

## 2023-11-04 ENCOUNTER — Ambulatory Visit: Payer: PRIVATE HEALTH INSURANCE | Admitting: Primary Care

## 2023-11-06 ENCOUNTER — Other Ambulatory Visit: Payer: PRIVATE HEALTH INSURANCE | Admitting: Licensed Clinical Social Worker

## 2023-11-11 ENCOUNTER — Ambulatory Visit: Payer: PRIVATE HEALTH INSURANCE | Attending: Cardiology | Admitting: Cardiology

## 2023-11-11 ENCOUNTER — Encounter: Payer: Self-pay | Admitting: Cardiology

## 2023-11-11 VITALS — BP 120/60 | HR 76 | Ht 69.0 in | Wt 207.0 lb

## 2023-11-11 DIAGNOSIS — I10 Essential (primary) hypertension: Secondary | ICD-10-CM

## 2023-11-11 DIAGNOSIS — I428 Other cardiomyopathies: Secondary | ICD-10-CM | POA: Diagnosis not present

## 2023-11-11 DIAGNOSIS — E785 Hyperlipidemia, unspecified: Secondary | ICD-10-CM

## 2023-11-11 DIAGNOSIS — I340 Nonrheumatic mitral (valve) insufficiency: Secondary | ICD-10-CM

## 2023-11-11 LAB — LIPID PANEL

## 2023-11-11 NOTE — Patient Instructions (Signed)
   Testing/Procedures:  Your physician has requested that you have a cardiac MRI. Cardiac MRI uses a computer to create images of your heart as its beating, producing both still and moving pictures of your heart and major blood vessels. For further information please visit [email or web address]. Please follow the instruction sheet given to you today for more information.   Follow-Up: At [facility], you and your health needs are our priority.  As part of our continuing mission to provide you with exceptional heart care, our providers are all part of one team.  This team includes your primary Cardiologist (physician) and Advanced Practice Providers or APPs (Physician Assistants and Nurse Practitioners) who all work together to provide you with the care you need, when you need it.  Your next appointment:   6 month(s)  Provider:   Redell Shallow, MD    Other Instructions   You are scheduled for Cardiac MRI at the location below.  Please arrive for your appointment at ______________ . ?  [facility] [address] [place], [place] [address] Please take advantage of the free valet parking available at the [employer] and [employer] (Entrance C).  Proceed to the [facility] Radiology Department (First Floor) for check-in.    Magnetic resonance imaging (MRI) is a painless test that produces images of the inside of the body without using Xrays.  During an MRI, strong magnets and radio waves work together in a Data processing manager to form detailed images.   MRI images may provide more details about a medical condition than X-rays, CT scans, and ultrasounds can provide.  You may be given earphones to listen for instructions.  You may eat a light breakfast and take medications as ordered with the exception of furosemide , hydrochlorothiazide, chlorthalidone or spironolactone  (or any other fluid pill). If you are undergoing a stress MRI, please avoid stimulants for 12 hr prior to  test. (I.e. Caffeine, nicotine, chocolate, or antihistamine medications)  If your provider has ordered anti-anxiety medications for this test, then you will need a driver.  An IV will be inserted into one of your veins. Contrast material will be injected into your IV. It will leave your body through your urine within a day. You may be told to drink plenty of fluids to help flush the contrast material out of your system.  You will be asked to remove all metal, including: Watch, jewelry, and other metal objects including hearing aids, hair pieces and dentures. Also wearable glucose monitoring systems (ie. Freestyle Libre and Omnipods) (Braces and fillings normally are not a problem.)   TEST WILL TAKE APPROXIMATELY 1 HOUR  PLEASE NOTIFY SCHEDULING AT LEAST 24 HOURS IN ADVANCE IF YOU ARE UNABLE TO KEEP YOUR APPOINTMENT. [phone]  For more information and frequently asked questions, please visit our website : [email or web address]  Please call the Cardiac Imaging Nurse Navigators with any questions/concerns. [phone] Office

## 2023-11-12 ENCOUNTER — Ambulatory Visit: Payer: Self-pay | Admitting: Cardiology

## 2023-11-12 LAB — COMPREHENSIVE METABOLIC PANEL WITH GFR
ALT: 17 IU/L (ref 0–32)
AST: 13 IU/L (ref 0–40)
Albumin: 4.2 g/dL (ref 3.8–4.9)
Alkaline Phosphatase: 105 IU/L (ref 49–135)
BUN/Creatinine Ratio: 16 (ref 9–23)
BUN: 16 mg/dL (ref 6–24)
Bilirubin Total: 0.7 mg/dL (ref 0.0–1.2)
CO2: 27 mmol/L (ref 20–29)
Calcium: 9.6 mg/dL (ref 8.7–10.2)
Chloride: 100 mmol/L (ref 96–106)
Creatinine, Ser: 1 mg/dL (ref 0.57–1.00)
Globulin, Total: 2.4 g/dL (ref 1.5–4.5)
Glucose: 163 mg/dL — ABNORMAL HIGH (ref 70–99)
Potassium: 3.9 mmol/L (ref 3.5–5.2)
Sodium: 142 mmol/L (ref 134–144)
Total Protein: 6.6 g/dL (ref 6.0–8.5)
eGFR: 66 mL/min/1.73 (ref >59)

## 2023-11-12 LAB — LIPID PANEL
Cholesterol, Total: 227 mg/dL — AB (ref 100–199)
HDL: 61 mg/dL (ref >39)
LDL CALC COMMENT:: 3.7 ratio (ref 0.0–4.4)
LDL Chol Calc (NIH): 150 mg/dL — AB (ref 0–99)
Triglycerides: 91 mg/dL (ref 0–149)
VLDL Cholesterol Cal: 16 mg/dL (ref 5–40)

## 2023-11-12 NOTE — Patient Instructions (Signed)
 Visit Information  Thank you for taking time to visit with me today. Please don't hesitate to contact me if I can be of assistance to you before our next scheduled appointment.  Your next care management appointment is by telephone on 10/22 at 3:30 PM  Please call the care guide team at [phone] if you need to cancel, schedule, or reschedule an appointment.   Please call the Suicide and Crisis Lifeline: 988 go to [facility] [address], [place] [phone]) call 911 if you are experiencing a Mental Health or Behavioral Health Crisis or need someone to talk to.  Rolin Kerns, LCSW [place]  [facility], [facility] Clinical Social Worker Direct Dial : [phone]  Fax: [phone] Website: [email or web address] 9:55 AM

## 2023-11-13 ENCOUNTER — Telehealth: Payer: Self-pay | Admitting: *Deleted

## 2023-11-13 NOTE — Telephone Encounter (Signed)
 I just scheduled this patient for her CMRI on 10/22. She stated she is claustrophobic when it comes to MRI. Could yall send something in for her to take prior to CMRI? Please and Thank you

## 2023-11-21 NOTE — Telephone Encounter (Signed)
 Left message for pt to call to discuss. Dr pietro has given the okay for valium  5 mg as long as patient has not had problems with that before.

## 2023-11-22 MED ORDER — DIAZEPAM 5 MG PO TABS: ORAL_TABLET | ORAL | 0 refills | Status: AC

## 2023-11-22 NOTE — Telephone Encounter (Signed)
 Spoke with pt and advised Dr Pietro has approved Valium  5 mg - 1 tablet 1 hour prior to MRI.  Pt states she has someone to dive her to and from testing.  Pt thanked Charity fundraiser for the call.  Order placed.

## 2023-11-22 NOTE — Telephone Encounter (Signed)
 Pt returning call to a nurse

## 2023-12-04 ENCOUNTER — Ambulatory Visit (HOSPITAL_COMMUNITY): Payer: PRIVATE HEALTH INSURANCE

## 2023-12-04 ENCOUNTER — Telehealth: Payer: PRIVATE HEALTH INSURANCE | Admitting: Licensed Clinical Social Worker

## 2023-12-06 ENCOUNTER — Telehealth: Payer: Self-pay

## 2023-12-06 NOTE — Progress Notes (Signed)
 Complex Care Management Note Care Guide Note  12/06/2023 Name: [name] MRN: [id number] DOB: 15-Sep-1965   Complex Care Management Outreach Attempts: An unsuccessful telephone outreach was attempted today to offer the patient information about available complex care management services.  Follow Up Plan:  No further outreach attempts will be made at this time. We have been unable to contact the patient to offer or enroll patient in complex care management services.  Encounter Outcome:  No Answer-Left voicemail  [facility], [facility] Guide  Direct Dial : [phone]  Fax [phone]

## 2023-12-13 ENCOUNTER — Telehealth: Payer: Self-pay | Admitting: Cardiology

## 2023-12-13 NOTE — Telephone Encounter (Signed)
 Returned call to pt, per her request.  Left a message that the Valium  5 mg taking only for MRI, #1 tablets was sent to her pharmacy 11/22/23.

## 2023-12-13 NOTE — Telephone Encounter (Signed)
*  STAT* If patient is at the pharmacy, call can be transferred to refill team.   1. Which medications need to be refilled? (please list name of each medication and dose if known)  diazepam  (VALIUM ) 5 MG tablet  2. Which pharmacy/location (including street and city if local pharmacy) is medication to be sent to? [id number] - [place], [place] - [address] AT [facility] OF [address]    3. Do they need a 30 day or 90 day supply?   1 tablet prior to MRI  Patient requests a call back to confirm when this is being sent if at all possible. She says the pharmacy will not call her when it's ready.

## 2023-12-16 ENCOUNTER — Telehealth: Payer: Self-pay | Admitting: *Deleted

## 2023-12-16 ENCOUNTER — Encounter (HOSPITAL_COMMUNITY): Payer: Self-pay

## 2023-12-16 ENCOUNTER — Other Ambulatory Visit: Payer: Self-pay | Admitting: *Deleted

## 2023-12-16 DIAGNOSIS — I428 Other cardiomyopathies: Secondary | ICD-10-CM

## 2023-12-16 NOTE — Telephone Encounter (Signed)
 Left message for patient, she needs a cbc prior to her MRI. Ask her to go by [facility] tomorrow. Order placed

## 2023-12-16 NOTE — Telephone Encounter (Signed)
 Patient called me back and reported she does not know how she is going to get lab work. 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 She is also working until her MRI at 4 pm. Will forward to the MRI team to see if they can do stat labs when she arrives.

## 2023-12-17 ENCOUNTER — Ambulatory Visit (INDEPENDENT_AMBULATORY_CARE_PROVIDER_SITE_OTHER): Payer: PRIVATE HEALTH INSURANCE | Admitting: Primary Care

## 2023-12-17 ENCOUNTER — Encounter: Payer: Self-pay | Admitting: Primary Care

## 2023-12-17 VITALS — BP 122/64 | HR 83 | Temp 97.4°F | Ht 69.0 in | Wt 214.4 lb

## 2023-12-17 DIAGNOSIS — R0683 Snoring: Secondary | ICD-10-CM

## 2023-12-17 DIAGNOSIS — E669 Obesity, unspecified: Secondary | ICD-10-CM

## 2023-12-17 DIAGNOSIS — G4710 Hypersomnia, unspecified: Secondary | ICD-10-CM | POA: Diagnosis not present

## 2023-12-17 DIAGNOSIS — I429 Cardiomyopathy, unspecified: Secondary | ICD-10-CM | POA: Diagnosis not present

## 2023-12-17 DIAGNOSIS — I340 Nonrheumatic mitral (valve) insufficiency: Secondary | ICD-10-CM

## 2023-12-17 DIAGNOSIS — Z8673 Personal history of transient ischemic attack (TIA), and cerebral infarction without residual deficits: Secondary | ICD-10-CM

## 2023-12-17 NOTE — Progress Notes (Signed)
 @Patient  ID: [name], female    DOB: [birth year], 58 y.o.   MRN: [id number]  No chief complaint on file.   Referring provider: Leigh Venetia CROME, MD  HPI: 58 year old female, never smoked. PMH significant for HTN, dilated cardiomyopathy, chronic heart failure, type 2 diabetes, lung nodules, hyperlipidemia.   12/17/2023 Discussed the use of AI scribe software for clinical note transcription with the patient, who gave verbal consent to proceed.  History of Present Illness [name] is a 58 year old female with a history of stroke and dilated cardiomyopathy who presents for a sleep consult. She was referred by a neurologist for evaluation of sleep issues following a mini stroke.  She experiences restless sleep and significant daytime sleepiness, with an Epworth Sleepiness Scale score of 19 out of 24, indicating excessive daytime sleepiness. She acknowledges snoring but denies waking up gasping or choking. Although she has not had any accidents due to sleepiness while driving, she has felt sleepy in traffic.  Her medical history includes a stroke. She has high blood pressure, heart failure with reduced ejection fraction, and dilated cardiomyopathy. She is under the care of a cardiologist and has an upcoming cardiac MRI to assess left ventricular function and mitral regurgitation.  Her current medications are hydralazine , carvedilol , Lasix , Jardiance , spironolactone , Crestor , and Zetia . She has a history of angioedema due to ACE inhibitors. She does not use oxygen or have a history of COPD, but she uses a rescue inhaler for shortness of breath.  Typical bedtime is 8pm. Takes her 45 minutes to fall asleep. She wakes up 4 times throughout the night. She starts her day at 3:45am.   Socially, she lives alone and prefers to conduct the sleep study at home. She consumes alcohol.   Allergies  Allergen Reactions   Lisinopril  Hives and Swelling    Facial    Sertraline  Hives and Swelling     Facial    Tramadol  Hives and Swelling    facial   Ibuprofen Hives and Swelling    Immunization History  Administered Date(s) Administered   Influenza, Seasonal, Injecte, Preservative Fre 12/24/2022   Influenza,inj,Quad PF,6+ Mos 01/17/2021, 02/26/2022   PFIZER(Purple Top)SARS-COV-2 Vaccination 05/02/2019, 05/23/2019, 02/18/2020   Pneumococcal Conjugate (Pcv15) 01/17/2021   Pneumococcal Polysaccharide-23 06/20/2016   Tdap 06/20/2016   Zoster Recombinant(Shingrix ) 07/21/2021, 02/26/2022    Past Medical History:  Diagnosis Date   Asthma 05/31/2017   Cataracts, bilateral    Age-related   Diabetes mellitus, type 2 (HCC)    A1C 7.6 April '13   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, now resolved   Systolic CHF (HCC)    Echo 05/19/11 EF 35% w/ mild-mod MR    Tobacco History: Social History   Tobacco Use  Smoking Status Never  Smokeless Tobacco Never   Counseling given: Not Answered   Outpatient Medications Prior to Visit  Medication Sig Dispense Refill   albuterol  (VENTOLIN  HFA) 108 (90 Base) MCG/ACT inhaler Inhale 2 puffs into the lungs every 6 (six) hours as needed for wheezing or shortness of breath. 18 g 6  aspirin  81 MG chewable tablet Chew 1 tablet (81 mg total) by mouth daily. 30 tablet 3   busPIRone  (BUSPAR ) 5 MG tablet Take 1 tablet (5 mg total) by mouth 2 (two) times daily. 60 tablet 0   carvedilol  (COREG ) 25 MG tablet Take 1 tablet (25 mg total) by mouth 2 (two) times daily with a meal. 180 tablet 1   Continuous Blood Gluc Transmit  (DEXCOM G6 TRANSMITTER) MISC 1 packet by Does not apply route daily. (Patient not taking: Reported on 11/11/2023) 1 each 4   diazepam  (VALIUM ) 5 MG tablet Take one tablet 1 hour prior to MRI 1 tablet 0   Dulaglutide  (TRULICITY ) 1.5 MG/0.5ML SOAJ Inject 1.5 mg into the skin once a week. 2 mL 6   DULoxetine  (CYMBALTA ) 20 MG capsule Take 1 capsule (20 mg total) by mouth daily. 30 capsule 3   empagliflozin  (JARDIANCE ) 10 MG TABS tablet Take 1 tablet (10 mg total) by mouth daily before breakfast. 90 tablet 1   ezetimibe  (ZETIA ) 10 MG tablet Take 1 tablet (10 mg total) by mouth daily. 90 tablet 2   furosemide  (LASIX ) 40 MG tablet Take 1 tablet (40 mg total) by mouth daily. 90 tablet 1   gabapentin  (NEURONTIN ) 100 MG capsule Take 2 capsules (200 mg total) by mouth at bedtime. For diabetic neuropathy symptoms (Patient not taking: Reported on 11/11/2023) 180 capsule 3   hydrALAZINE  (APRESOLINE ) 50 MG tablet Take 1.5 tablets (75 mg total) by mouth 3 (three) times daily. 135 tablet 6   isosorbide  mononitrate (IMDUR ) 30 MG 24 hr tablet Take 1 tablet (30 mg total) by mouth daily. 90 tablet 1   LANTUS  SOLOSTAR 100 UNIT/ML Solostar Pen Inject 40 Units into the skin daily. 45 mL 6   metFORMIN  (GLUCOPHAGE -XR) 500 MG 24 hr tablet Take 1 tablet (500 mg total) by mouth daily with breakfast. 90 tablet 1   pantoprazole  (PROTONIX ) 40 MG tablet Take 1 tablet (40 mg total) by mouth daily. (Patient not taking: Reported on 11/11/2023) 60 tablet 1   rosuvastatin  (CRESTOR ) 40 MG tablet Take 1 tablet (40 mg total) by mouth daily. 90 tablet 1   spironolactone  (ALDACTONE ) 25 MG tablet TAKE 1 TABLET(25 MG) BY MOUTH DAILY 90 tablet 1   Study - OCEANIC-STROKE - asundexian 50 mg or placebo tablet (PI-Sethi) Take 1 tablet (50 mg total) by mouth daily. For Investigational Only. Take 1 tablet by mouth once daily at the same time each day, preferably in the morning. Please bring bottle to every visit. (Patient not taking: Reported on  11/11/2023) 196 tablet 0   Vitamin D , Ergocalciferol , (DRISDOL ) 1.25 MG (50000 UNIT) CAPS capsule Take 1 capsule (50,000 Units total) by mouth every 7 (seven) days. 12 capsule 1   No facility-administered medications prior to visit.   Review of Systems  Review of Systems  Respiratory:  Positive for shortness of breath.   Cardiovascular:  Positive for palpitations.   Physical Exam  There were no vitals taken for this visit. Physical Exam Constitutional:      Appearance: Normal appearance. She is well-developed.  HENT:     Head: Normocephalic and atraumatic.     Mouth/Throat:     Mouth: Mucous membranes are moist.     Pharynx: Oropharynx is clear.  Eyes:     Pupils: Pupils are equal, round, and reactive to light.  Cardiovascular:     Rate and Rhythm: Normal rate and regular rhythm.     Heart sounds: Normal heart sounds. No murmur  heard.    Comments: CTA Pulmonary:     Effort: Pulmonary effort is normal. No respiratory distress.     Breath sounds: Normal breath sounds. No wheezing or rhonchi.  Musculoskeletal:        General: Normal range of motion.     Cervical back: Normal range of motion and neck supple.  Skin:    General: Skin is warm and dry.     Findings: No erythema or rash.  Neurological:     General: No focal deficit present.     Mental Status: She is alert and oriented to person, place, and time. Mental status is at baseline.  Psychiatric:        Mood and Affect: Mood normal.        Behavior: Behavior normal.        Thought Content: Thought content normal.        Judgment: Judgment normal.      Lab Results:  CBC    Component Value Date/Time   WBC 5.0 12/24/2022 1450   WBC 6.7 12/12/2022 0442   RBC 4.91 12/24/2022 1450   RBC 3.96 12/12/2022 0442   HGB 13.5 12/24/2022 1450   HCT 43.0 12/24/2022 1450   PLT 340 12/24/2022 1450   MCV 88 12/24/2022 1450   MCH 27.5 12/24/2022 1450   MCH 27.3 12/12/2022 0442   MCHC 31.4 (L) 12/24/2022 1450   MCHC 31.7  12/12/2022 0442   RDW 13.5 12/24/2022 1450   LYMPHSABS 2.5 07/31/2017 1225   MONOABS 0.6 06/02/2017 0530   EOSABS 0.2 07/31/2017 1225   BASOSABS 0.1 07/31/2017 1225    BMET    Component Value Date/Time   NA 142 11/11/2023 1515   K 3.9 11/11/2023 1515   CL 100 11/11/2023 1515   CO2 27 11/11/2023 1515   GLUCOSE 163 (H) 11/11/2023 1515   GLUCOSE 200 (H) 12/12/2022 0442   BUN 16 11/11/2023 1515   CREATININE 1.00 11/11/2023 1515   CREATININE 0.92 06/29/2016 1031   CALCIUM  9.6 11/11/2023 1515   GFRNONAA >60 12/12/2022 0442   GFRNONAA 70 05/05/2015 1645   GFRAA 69 02/09/2020 0819   GFRAA 80 05/05/2015 1645    BNP    Component Value Date/Time   BNP 582.5 (H) 07/19/2017 1137   BNP 470.7 (H) 05/05/2015 1645    ProBNP    Component Value Date/Time   PROBNP 30.0 10/30/2012 1028    Imaging: No results found.   Assessment & Plan:   No problem-specific Assessment & Plan notes found for this encounter.   There are no diagnoses linked to this encounter.  Assessment and Plan Assessment & Plan Sleep disturbance with excessive daytime sleepiness (evaluation for obstructive sleep apnea) Reports snoring, restless sleep, and excessive daytime sleepiness with an Epworth Sleepiness Scale score of 19/24. No history of waking up gasping or choking. Discussed potential impact of sleep apnea on cardiac health, including increased risk of cardiac arrhythmia, stroke, pulmonary hypertension, and diabetes. Treatment depends on severity, with CPAP being the gold standard for moderate to severe cases. Even mild sleep apnea may warrant CPAP due to cardiac history. Discussed alternative treatments such as weight loss, positional therapy, and oral appliances. Emphasized importance of avoiding alcohol before bed as it can worsen sleep apnea. - Ordered home sleep study to evaluate for obstructive sleep apnea. - If sleep apnea is confirmed, will recommend CPAP therapy due to cardiac history. -  Discussed weight loss as a potential method to reduce apneas. - Advised on positional  therapy, such as side sleeping or using a wedge pillow. - Counseled on reducing alcohol consumption before bed. - Advised against driving if excessively tired.  Obesity BMI is 31, indicating obesity. Discussed potential benefits of weight loss in reducing the severity of sleep apnea and improving overall health. - Encouraged weight loss to potentially reduce sleep apnea severity.  Nonischemic cardiomyopathy with reduced ejection fraction  Nonischemic cardiomyopathy with reduced ejection fraction (35-40%) and congestive heart failure. Managed with hydralazine , carvedilol , Lasix , Jardiance , and spironolactone . Cardiac MRI scheduled to assess left ventricle function and reassess mitral regurgitation. No history of arrhythmias reported. - Continue current medications for heart failure management. - Proceed with scheduled cardiac MRI.  Mitral valve regurgitation Cardiac MRI scheduled to reassess mitral regurgitation. - Proceed with scheduled cardiac MRI to reassess mitral regurgitation.   Michelle LELON Ferrari, NP 12/17/2023

## 2023-12-17 NOTE — Patient Instructions (Addendum)
   VISIT SUMMARY: Today, you were seen for a sleep consultation due to your restless sleep and significant daytime sleepiness. We discussed your symptoms, medical history, and the potential impact of sleep apnea on your overall health, especially given your cardiac history. We also reviewed your current medications and upcoming cardiac MRI.  YOUR PLAN: -SLEEP DISTURBANCE WITH EXCESSIVE DAYTIME SLEEPINESS: You have symptoms that suggest obstructive sleep apnea, a condition where the airway becomes blocked during sleep, leading to pauses in breathing. We have ordered a home sleep study to evaluate this. If confirmed, CPAP therapy will likely be recommended due to your cardiac history. We also discussed weight loss, positional therapy, and reducing alcohol consumption before bed as ways to help manage this condition. Please avoid driving if you feel excessively tired.  -OBESITY: Your BMI is 31, which classifies as obesity. Losing weight can help reduce the severity of sleep apnea and improve your overall health. We encourage you to work on weight loss.  -NONISCHEMIC CARDIOMYOPATHY WITH REDUCED EJECTION FRACTION AND CONGESTIVE HEART FAILURE: This is a condition where the heart muscle is weakened and cannot pump blood effectively. You are currently on medications to manage this, and you have a cardiac MRI scheduled to assess your heart function and mitral regurgitation. Please continue your current medications and proceed with the scheduled MRI.  -MITRAL VALVE REGURGITATION: This is a condition where the mitral valve in your heart does not close properly, causing blood to flow backward. You have a cardiac MRI scheduled to reassess this condition. Please proceed with the scheduled MRI.  INSTRUCTIONS: Please complete the home sleep study as ordered. Continue taking your current medications for heart failure and proceed with your scheduled cardiac MRI. Avoid driving if you feel excessively tired and work on  weight loss to help manage your sleep apnea and improve your overall health.  Orders: Home sleep test  Follow-up Call or send mychart message 2-3 weeks after completing home sleep study for result and potential treatment options

## 2023-12-18 ENCOUNTER — Ambulatory Visit (HOSPITAL_COMMUNITY): Payer: PRIVATE HEALTH INSURANCE

## 2023-12-18 ENCOUNTER — Encounter (HOSPITAL_COMMUNITY): Payer: Self-pay

## 2024-01-07 ENCOUNTER — Telehealth: Payer: PRIVATE HEALTH INSURANCE | Admitting: Licensed Clinical Social Worker

## 2024-01-10 ENCOUNTER — Encounter (HOSPITAL_COMMUNITY): Payer: Self-pay

## 2024-01-13 ENCOUNTER — Other Ambulatory Visit: Payer: PRIVATE HEALTH INSURANCE | Admitting: Licensed Clinical Social Worker

## 2024-01-14 ENCOUNTER — Ambulatory Visit: Payer: Self-pay | Admitting: Licensed Clinical Social Worker

## 2024-01-14 ENCOUNTER — Ambulatory Visit: Payer: PRIVATE HEALTH INSURANCE | Attending: Internal Medicine | Admitting: Internal Medicine

## 2024-01-14 VITALS — BP 130/78 | HR 89 | Temp 97.7°F | Ht 69.0 in | Wt 208.0 lb

## 2024-01-14 DIAGNOSIS — Z111 Encounter for screening for respiratory tuberculosis: Secondary | ICD-10-CM | POA: Diagnosis not present

## 2024-01-14 DIAGNOSIS — I152 Hypertension secondary to endocrine disorders: Secondary | ICD-10-CM

## 2024-01-14 DIAGNOSIS — E1142 Type 2 diabetes mellitus with diabetic polyneuropathy: Secondary | ICD-10-CM

## 2024-01-14 DIAGNOSIS — I5042 Chronic combined systolic (congestive) and diastolic (congestive) heart failure: Secondary | ICD-10-CM

## 2024-01-14 DIAGNOSIS — Z7984 Long term (current) use of oral hypoglycemic drugs: Secondary | ICD-10-CM

## 2024-01-14 DIAGNOSIS — Z794 Long term (current) use of insulin: Secondary | ICD-10-CM | POA: Diagnosis not present

## 2024-01-14 DIAGNOSIS — E1159 Type 2 diabetes mellitus with other circulatory complications: Secondary | ICD-10-CM

## 2024-01-14 DIAGNOSIS — E119 Type 2 diabetes mellitus without complications: Secondary | ICD-10-CM

## 2024-01-14 DIAGNOSIS — Z7985 Long-term (current) use of injectable non-insulin antidiabetic drugs: Secondary | ICD-10-CM | POA: Diagnosis not present

## 2024-01-14 DIAGNOSIS — Z23 Encounter for immunization: Secondary | ICD-10-CM

## 2024-01-14 DIAGNOSIS — F330 Major depressive disorder, recurrent, mild: Secondary | ICD-10-CM | POA: Diagnosis not present

## 2024-01-14 LAB — POCT GLYCOSYLATED HEMOGLOBIN (HGB A1C): HbA1c, POC (controlled diabetic range): 9.2 % — AB (ref 0.0–7.0)

## 2024-01-14 LAB — GLUCOSE, POCT (MANUAL RESULT ENTRY): POC Glucose: 159 mg/dL — AB (ref 70–99)

## 2024-01-14 MED ORDER — LANTUS SOLOSTAR 100 UNIT/ML ~~LOC~~ SOPN: 40.0000 [IU] | PEN_INJECTOR | Freq: Every day | SUBCUTANEOUS | 6 refills | Status: AC

## 2024-01-14 MED ORDER — FUROSEMIDE 40 MG PO TABS: 40.0000 mg | ORAL_TABLET | Freq: Every day | ORAL | 1 refills | Status: AC

## 2024-01-14 MED ORDER — SPIRONOLACTONE 25 MG PO TABS: 25.0000 mg | ORAL_TABLET | Freq: Every day | ORAL | 1 refills | Status: AC

## 2024-01-14 MED ORDER — TRULICITY 1.5 MG/0.5ML ~~LOC~~ SOAJ: 1.5000 mg | SUBCUTANEOUS | 6 refills | Status: AC

## 2024-01-14 NOTE — Progress Notes (Unsigned)
 Patient ID: [name], female    DOB: March 06, 1965  MRN: [id number]  CC: Diabetes (DM f/u/Sharp pain on r side of neck X6 mo/Requesting work form completion - needs TB skin test/No to flu vax.)   Subjective: [name] is a 58 y.o. female who presents for chronic ds management. 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   Discussed the use of AI scribe software for clinical note transcription with the patient, who gave verbal consent to proceed.  History of Present Illness     Patient Active Problem List   Diagnosis Date Noted   New cerebellar infarct (HCC) 12/11/2022   Chronic HFrEF (heart failure with reduced ejection fraction) (HCC) 12/11/2022   Insulin  dependent type 2 diabetes mellitus (HCC) 12/11/2022   NPDR (nonproliferative diabetic retinopathy) (HCC) 10/04/2021   Acute cough 04/03/2021   Upper respiratory tract infection 04/03/2021   Anxiety and depression 01/17/2021   Sinus tachycardia 10/15/2018   Vitamin D  deficiency 03/26/2018   Homeless 03/24/2018   Microalbuminuria 08/11/2017   Anemia 08/08/2017   Physical deconditioning    Hyperlipidemia 02/21/2017   Diabetic polyneuropathy associated with type 2 diabetes mellitus (HCC) 02/21/2017   Lung nodules 07/16/2016   Uncontrolled type 2 diabetes mellitus without complication, with long-term current use of insulin  07/16/2016   DCM (dilated cardiomyopathy) (HCC)    Hypertension associated with diabetes (HCC) 06/10/2016   Dyslipidemia associated with type 2 diabetes mellitus (HCC) 06/10/2016     Current Outpatient Medications on File Prior to Visit  Medication Sig Dispense Refill   albuterol  (VENTOLIN  HFA) 108 (90 Base) MCG/ACT inhaler Inhale 2 puffs  into the lungs every 6 (six) hours as needed for wheezing or shortness of breath. 18 g 6   aspirin  81 MG chewable tablet Chew 1 tablet (81 mg total) by mouth daily. 30 tablet 3   busPIRone  (BUSPAR ) 5 MG tablet Take 1 tablet (5 mg total) by mouth 2 (two) times daily. 60 tablet 0   carvedilol  (COREG ) 25 MG tablet Take 1 tablet (25 mg total) by mouth 2 (two) times daily with a meal. 180 tablet 1   diazepam  (VALIUM ) 5 MG tablet Take one tablet 1 hour prior to MRI 1 tablet 0   Dulaglutide  (TRULICITY ) 1.5 MG/0.5ML SOAJ Inject 1.5 mg into the skin once a week. 2 mL 6   DULoxetine  (CYMBALTA ) 20 MG capsule Take 1 capsule (20 mg total) by mouth daily. 30 capsule 3   empagliflozin  (JARDIANCE ) 10 MG TABS tablet Take 1 tablet (10 mg total) by mouth daily before breakfast. 90 tablet 1   ezetimibe  (ZETIA ) 10 MG tablet Take 1 tablet (10 mg total) by mouth daily. 90 tablet 2   furosemide  (LASIX ) 40 MG tablet Take 1 tablet (40 mg total) by mouth daily. 90 tablet 1   gabapentin  (NEURONTIN ) 100 MG capsule Take 2 capsules (200 mg total) by mouth at bedtime. For diabetic neuropathy symptoms 180 capsule 3   hydrALAZINE  (APRESOLINE ) 50 MG tablet Take 1.5 tablets (75 mg total) by mouth 3 (three) times daily. 135 tablet 6   isosorbide  mononitrate (IMDUR ) 30 MG 24 hr tablet Take 1 tablet (30 mg total) by mouth daily. 90 tablet 1   LANTUS  SOLOSTAR 100 UNIT/ML Solostar Pen Inject 40 Units into the skin daily. 45  mL 6   metFORMIN  (GLUCOPHAGE -XR) 500 MG 24 hr tablet Take 1 tablet (500 mg total) by mouth daily with breakfast. 90 tablet 1   pantoprazole  (PROTONIX ) 40 MG tablet Take 1 tablet (40 mg total) by mouth daily. 60 tablet 1   rosuvastatin  (CRESTOR ) 40 MG tablet Take 1 tablet (40 mg total) by mouth daily. 90 tablet 1   spironolactone  (ALDACTONE ) 25 MG tablet TAKE 1 TABLET(25 MG) BY MOUTH DAILY 90 tablet 1   Vitamin D , Ergocalciferol , (DRISDOL ) 1.25 MG (50000 UNIT) CAPS capsule Take 1 capsule (50,000 Units total) by mouth  every 7 (seven) days. 12 capsule 1   Continuous Blood Gluc Transmit (DEXCOM G6 TRANSMITTER) MISC 1 packet by Does not apply route daily. (Patient not taking: Reported on 11/11/2023) 1 each 4   Study - OCEANIC-STROKE - asundexian 50 mg or placebo tablet (PI-Sethi) Take 1 tablet (50 mg total) by mouth daily. For Investigational Only. Take 1 tablet by mouth once daily at the same time each day, preferably in the morning. Please bring bottle to every visit. (Patient not taking: Reported on 11/11/2023) 196 tablet 0   No current facility-administered medications on file prior to visit.    Allergies  Allergen Reactions   Lisinopril  Hives and Swelling    Facial    Sertraline  Hives and Swelling    Facial    Tramadol  Hives and Swelling    facial   Ibuprofen Hives and Swelling    Social History   Socioeconomic History   Marital status: Single    Spouse name: Not on file   Number of children: 2   Years of education: Not on file   Highest education level: Bachelor's degree (e.g., BA, AB, BS)  Occupational History   Not on file  Tobacco Use   Smoking status: Never   Smokeless tobacco: Never  Vaping Use   Vaping status: Never Used  Substance and Sexual Activity   Alcohol use: Yes    Comment: occ wine   Drug use: No   Sexual activity: Yes    Birth control/protection: None  Other Topics Concern   Not on file  Social History Narrative   Lives in [place].  Presently unemployed but attends school full time   Are you right handed or left handed? Right    Are you currently employed ?    What is your current occupation? [employer]   Do you live at home alone?yes   Who lives with you?    What type of home do you live in: 1 story or 2 story? one   Caffiene 1 soda a day    Social Drivers of Corporate Investment Banker Strain: High Risk (01/13/2024)   Overall Financial Resource Strain (CARDIA)    Difficulty of Paying Living Expenses: Very hard  Food Insecurity: Food Insecurity  Present (01/13/2024)   Hunger Vital Sign    Worried About Running Out of Food in the Last Year: Often true    Ran Out of Food in the Last Year: Often true  Transportation Needs: No Transportation Needs (01/13/2024)   PRAPARE - Administrator, Civil Service (Medical): No    Lack of Transportation (Non-Medical): No  Physical Activity: Insufficiently Active (01/13/2024)   Exercise Vital Sign    Days of Exercise per Week: 3 days    Minutes of Exercise per Session: 30 min  Stress: Stress Concern Present (01/13/2024)   [employer] of Occupational Health - Occupational Stress Questionnaire  Feeling of Stress: Very much  Social Connections: Moderately Integrated (01/13/2024)   Social Connection and Isolation Panel    Frequency of Communication with Friends and Family: More than three times a week    Frequency of Social Gatherings with Friends and Family: More than three times a week    Attends Religious Services: More than 4 times per year    Active Member of [employer] or Organizations: Yes    Attends Engineer, Structural: More than 4 times per year    Marital Status: Divorced  Catering Manager Violence: Not At Risk (10/09/2023)   Humiliation, Afraid, Rape, and Kick questionnaire    Fear of Current or Ex-Partner: No    Emotionally Abused: No    Physically Abused: No    Sexually Abused: No    Family History  Problem Relation Age of Onset   Diabetes Other        multiple   Heart failure Paternal Grandmother    Breast cancer Paternal Grandmother    Heart disease Other        multiple   Breast cancer Mother     Past Surgical History:  Procedure Laterality Date   BREAST BIOPSY Left 05/24/2022   MM LT BREAST BX W LOC DEV 1ST LESION IMAGE BX SPEC STEREO GUIDE 05/24/2022 [facility]   CERVIX LESION DESTRUCTION     DILATION AND CURETTAGE OF UTERUS     LEFT HEART CATHETERIZATION WITH CORONARY ANGIOGRAM N/A 05/21/2011   Procedure: LEFT HEART CATHETERIZATION WITH  CORONARY ANGIOGRAM;  Surgeon: Ozell Fell, MD;  Location: [facility] C[facility] LAB;  Service: Cardiovascular;  Laterality: N/A;   TUBAL LIGATION  1996    ROS: Review of Systems Negative except as stated above  PHYSICAL EXAM: BP 130/78 (BP Location: Left Arm, Patient Position: Sitting, Cuff Size: Normal)   Pulse 89   Temp 97.7 F (36.5 C) (Oral)   Ht 5' 9 (1.753 m)   Wt 208 lb (94.3 kg)   SpO2 100%   BMI 30.72 kg/m   Physical Exam  {female adult master:310786} {female adult master:310785}     10/03/2023    1:54 PM 06/11/2023    5:36 PM 06/06/2023    3:19 PM  Depression screen PHQ 2/9  Decreased Interest 2  0  Down, Depressed, Hopeless 2  0  PHQ - 2 Score 4  0  Altered sleeping 3  0  Tired, decreased energy 3  0  Change in appetite 3  0  Feeling bad or failure about yourself  3  0  Trouble concentrating   0  Moving slowly or fidgety/restless 3  0  Suicidal thoughts 3  0  PHQ-9 Score 22   0   Difficult doing work/chores Very difficult  Not difficult at all     Information is confidential and restricted. Go to Review Flowsheets to unlock data.   Data saved with a previous flowsheet row definition       Latest Ref Rng & Units 11/11/2023    3:15 PM 03/12/2023    3:15 PM 12/24/2022    2:50 PM  CMP  Glucose 70 - 99 mg/dL 836  836  800   BUN 6 - 24 mg/dL 16  21  19    Creatinine 0.57 - 1.00 mg/dL 8.99  8.94  8.75   Sodium 134 - 144 mmol/L 142  141  138   Potassium 3.5 - 5.2 mmol/L 3.9  3.8  3.9   Chloride 96 - 106 mmol/L 100  103  98   CO2 20 - 29 mmol/L 27  23  26    Calcium  8.7 - 10.2 mg/dL 9.6  9.5  89.8   Total Protein 6.0 - 8.5 g/dL 6.6     Total Bilirubin 0.0 - 1.2 mg/dL 0.7     Alkaline Phos 49 - 135 IU/L 105     AST 0 - 40 IU/L 13     ALT 0 - 32 IU/L 17      Lipid Panel     Component Value Date/Time   CHOL 227 (H) 11/11/2023 1515   TRIG 91 11/11/2023 1515   HDL 61 11/11/2023 1515   CHOLHDL 3.7 11/11/2023 1515   CHOLHDL 4.4 07/11/2023 1615   VLDL 24 12/12/2022  0442   LDLCALC 150 (H) 11/11/2023 1515   LDLCALC 163 (H) 07/11/2023 1615    CBC    Component Value Date/Time   WBC 5.0 12/24/2022 1450   WBC 6.7 12/12/2022 0442   RBC 4.91 12/24/2022 1450   RBC 3.96 12/12/2022 0442   HGB 13.5 12/24/2022 1450   HCT 43.0 12/24/2022 1450   PLT 340 12/24/2022 1450   MCV 88 12/24/2022 1450   MCH 27.5 12/24/2022 1450   MCH 27.3 12/12/2022 0442   MCHC 31.4 (L) 12/24/2022 1450   MCHC 31.7 12/12/2022 0442   RDW 13.5 12/24/2022 1450   LYMPHSABS 2.5 07/31/2017 1225   MONOABS 0.6 06/02/2017 0530   EOSABS 0.2 07/31/2017 1225   BASOSABS 0.1 07/31/2017 1225    ASSESSMENT AND PLAN:  Assessment and Plan Assessment & Plan      1. Type 2 diabetes mellitus with diabetic polyneuropathy, with long-term current use of insulin  (HCC) (Primary) *** - POCT glucose (manual entry) - POCT glycosylated hemoglobin (Hb A1C)    Patient was given the opportunity to ask questions.  Patient verbalized understanding of the plan and was able to repeat key elements of the plan.   This documentation was completed using Paediatric nurse.  Any transcriptional errors are unintentional.  Orders Placed This Encounter  Procedures   POCT glucose (manual entry)   POCT glycosylated hemoglobin (Hb A1C)     Requested Prescriptions    No prescriptions requested or ordered in this encounter    No follow-ups on file.  Barnie Louder, MD, FACP

## 2024-01-14 NOTE — Patient Instructions (Signed)
  VISIT SUMMARY: Today, we reviewed your diabetes management, depression, heart health, and general health maintenance. Your A1c has increased, likely due to a lapse in medication. We discussed your current medications and made some adjustments. We also addressed your depression and heart health, and provided some general health maintenance recommendations.  YOUR PLAN: -TYPE 2 DIABETES MELLITUS WITH DIABETIC POLYNEUROPATHY: Your A1c has increased to 9.2, likely because you were out of your diabetes medications for a month. Type 2 diabetes is a condition where your body does not use insulin  properly, leading to high blood sugar levels. We have sent prescriptions for Trulicity  and Lantus  to [facility] and encouraged you to monitor your blood sugar more frequently.  -MAJOR DEPRESSIVE DISORDER, RECURRENT, MILD: Your depression has improved with Cymbalta , but you still experience crying spells and lack of motivation. Depression is a mental health condition that affects your mood and daily activities. Continue taking Cymbalta  20 mg daily and try to continue therapy sessions if possible.  -CHRONIC COMBINED SYSTOLIC AND DIASTOLIC HEART FAILURE, NYHA CLASS I: Your heart failure is well controlled. Heart failure is a condition where your heart does not pump blood as well as it should. We have sent prescriptions for furosemide  and spironolactone  for refills.  -GENERAL HEALTH MAINTENANCE: You are overdue for an eye exam and uncertain about your hepatitis B and MMR vaccination status. We have submitted a referral to [facility] for an eye exam, administered your flu shot today, and performed a TB skin test. Please check with the health department for your immunization records.  INSTRUCTIONS: Please follow up with the health department to check your hepatitis B and MMR vaccination status. Make sure to monitor your blood sugar more frequently and continue taking your medications as prescribed. Schedule an  appointment with [facility] for your overdue eye exam. Continue with your current medications for depression and heart health, and try to attend therapy sessions if feasible.                      Contains text generated by Abridge.                                 Contains text generated by Abridge.

## 2024-01-15 ENCOUNTER — Encounter: Payer: Self-pay | Admitting: Licensed Clinical Social Worker

## 2024-01-15 ENCOUNTER — Ambulatory Visit (HOSPITAL_COMMUNITY)
Admission: RE | Admit: 2024-01-15 | Discharge: 2024-01-15 | Disposition: A | Payer: PRIVATE HEALTH INSURANCE | Source: Ambulatory Visit | Attending: Cardiology | Admitting: Cardiology

## 2024-01-15 ENCOUNTER — Other Ambulatory Visit: Payer: Self-pay | Admitting: Cardiology

## 2024-01-15 ENCOUNTER — Encounter: Payer: Self-pay | Admitting: Internal Medicine

## 2024-01-15 DIAGNOSIS — I340 Nonrheumatic mitral (valve) insufficiency: Secondary | ICD-10-CM | POA: Diagnosis not present

## 2024-01-15 DIAGNOSIS — I428 Other cardiomyopathies: Secondary | ICD-10-CM

## 2024-01-15 MED ORDER — EMPAGLIFLOZIN 10 MG PO TABS: 10.0000 mg | ORAL_TABLET | Freq: Every day | ORAL | 1 refills | Status: AC

## 2024-01-15 MED ORDER — DULOXETINE HCL 20 MG PO CPEP: 20.0000 mg | ORAL_CAPSULE | Freq: Every day | ORAL | 6 refills | Status: AC

## 2024-01-15 MED ORDER — GADOBUTROL 1 MMOL/ML IV SOLN
12.0000 mL | Freq: Once | INTRAVENOUS | Status: AC | PRN
  Administered 2024-01-15: 12 mL via INTRAVENOUS

## 2024-01-15 MED ORDER — METFORMIN HCL ER 500 MG PO TB24: 500.0000 mg | ORAL_TABLET | Freq: Every day | ORAL | 1 refills | Status: AC

## 2024-01-15 NOTE — Patient Instructions (Signed)
 Visit Information  Thank you for taking time to visit with me today. Please don't hesitate to contact me if I can be of assistance to you before our next scheduled appointment.  Please call the care guide team at [phone] if you need to cancel, schedule, or reschedule an appointment.   LCSW will check in with you on 12/2 during PCP appt.   Please call the Suicide and Crisis Lifeline: 988 go to [facility] Urgent [facility] [address] [place] [phone]) call 911 if you are experiencing a Mental Health or Behavioral Health Crisis or need someone to talk to.  Rolin Kerns, LCSW [place]  [facility], [facility] Clinical Social Worker Direct Dial : [phone]  Fax: [phone] Website: [email or web address] 9:20 AM

## 2024-01-15 NOTE — Patient Instructions (Signed)
 Visit Information  Thank you for taking time to visit with me today. Please don't hesitate to contact me if I can be of assistance to you before our next scheduled appointment.  Your next care management appointment is by telephone on 02/17/24 at 3:30 PM  Please call the care guide team at [phone] if you need to cancel, schedule, or reschedule an appointment.   Please call the Suicide and Crisis Lifeline: 988 go to [facility] Urgent [facility] [address] [place] [phone]) call 911 if you are experiencing a Mental Health or Behavioral Health Crisis or need someone to talk to.  Rolin Kerns, LCSW [place]  [facility], [facility] Clinical Social Worker Direct Dial : [phone]  Fax: [phone] Website: [email or web address] 9:30 AM

## 2024-01-15 NOTE — Patient Outreach (Signed)
 Complex Care Management   Visit Note  01/14/2024  Name:  [name] MRN: [id number] DOB: 1965/09/26  Situation: Referral received for Complex Care Management related to Mental/Behavioral Health diagnosis Anxiety I obtained verbal consent from Patient.  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, now resolved   Systolic CHF (HCC)    Echo 05/19/11 EF 35% w/ mild-mod MR    Assessment: Patient Reported Symptoms:  Cognitive Cognitive Status: No symptoms reported, Alert and oriented to person, place, and time, Normal speech and language skills Cognitive/Intellectual Conditions Management [RPT]: None reported or documented in medical history or problem list   Health Maintenance Behaviors: Annual physical exam  Neurological Neurological Review of Symptoms: Not assessed (Pt is in office for f/up appt with PCP)    HEENT HEENT Symptoms Reported: Not assessed (Pt is in office for f/up appt with PCP)      Cardiovascular Cardiovascular Symptoms Reported: Not assessed (Pt is in office for f/up appt with PCP)    Respiratory Respiratory Symptoms Reported: Not assesed (Pt is in office for f/up appt with PCP)    Endocrine Endocrine Symptoms Reported: Not assessed (Pt is in office for f/up appt with PCP)    Gastrointestinal Gastrointestinal Symptoms Reported: Not  assessed, Abdominal pain or discomfort (Pt is in office for f/up appt with PCP)      Genitourinary Genitourinary Symptoms Reported: Not assessed (Pt is in office for f/up appt with PCP)    Integumentary Integumentary Symptoms Reported: Not assessed (Pt is in office for f/up appt with PCP)    Musculoskeletal Musculoskelatal Symptoms Reviewed: Not assessed (Pt is in office for f/up appt with PCP)        Psychosocial Psychosocial Symptoms Reported: Other Other Psychosocial Conditions: Stress Behavioral Management Strategies: Coping strategies, Adequate rest, Support system, Community resources, Medication therapy Major Change/Loss/Stressor/Fears (CP): Medical condition, self, School or job Techniques to [employer] with Loss/Stress/Change: Diversional activities, Medication      01/15/2024    PHQ2-9 Depression Screening   Little interest or pleasure in doing things    Feeling down, depressed, or hopeless    PHQ-2 - Total Score    Trouble falling or staying asleep, or sleeping too much    Feeling tired or having little energy    Poor appetite or overeating     Feeling bad about yourself - or that you are a failure or have let yourself or your family down    Trouble concentrating on things, such as reading the newspaper or watching television    Moving or speaking so slowly that other people could have noticed.  Or the opposite - being so fidgety or restless that you have been moving around a lot more than usual    Thoughts that you would be better off dead, or hurting yourself in some way  PHQ2-9 Total Score    If you checked off any problems, how difficult have these problems made it for you to do your work, take care of things at home, or get along with other people    Depression Interventions/Treatment      There were no vitals filed for this visit.    Medications Reviewed Today     Reviewed by Ezzard Rolin BIRCH, LCSW (Social Worker) on 01/15/24 at [phone]  Med List Status: <None>    Medication Order Taking? Sig Documenting Provider Last Dose Status Informant  albuterol  (VENTOLIN  HFA) 108 (90 Base) MCG/ACT inhaler [id number]  Inhale 2 puffs into the lungs every 6 (six) hours as needed for wheezing or shortness of breath. Vicci Barnie NOVAK, MD  Active Self  aspirin  81 MG chewable tablet [id number]  Chew 1 tablet (81 mg total) by mouth daily. Pearlean Manus, MD  Active Self  busPIRone  (BUSPAR ) 5 MG tablet [id number]  Take 1 tablet (5 mg total) by mouth 2 (two) times daily. Vicci Barnie NOVAK, MD  Active   carvedilol  (COREG ) 25 MG tablet [id number]  Take 1 tablet (25 mg total) by mouth 2 (two) times daily with a meal. Vicci Barnie NOVAK, MD  Active   diazepam  (VALIUM ) 5 MG tablet [id number]  Take one tablet 1 hour prior to MRI Pietro Redell RAMAN, MD  Active   Dulaglutide  (TRULICITY ) 1.5 MG/0.5ML EMMANUEL [id number]  Inject 1.5 mg into the skin once a week. Vicci Barnie NOVAK, MD  Active   DULoxetine  (CYMBALTA ) 20 MG capsule [id number]  Take 1 capsule (20 mg total) by mouth daily. Vicci Barnie NOVAK, MD  Active   empagliflozin  (JARDIANCE ) 10 MG TABS tablet [id number]  Take 1 tablet (10 mg total) by mouth daily before breakfast. Vicci Barnie NOVAK, MD  Active   ezetimibe  (ZETIA ) 10 MG tablet [id number]  Take 1 tablet (10 mg total) by mouth daily. Vicci Barnie NOVAK, MD  Active   furosemide  (LASIX ) 40 MG tablet [id number]  Take 1 tablet (40 mg total) by mouth daily. Vicci Barnie NOVAK, MD  Active   gabapentin  (NEURONTIN ) 100 MG capsule [id number]  Take 2 capsules (200 mg total) by mouth at bedtime. For diabetic neuropathy symptoms Leigh Venetia CROME, MD  Active   hydrALAZINE  (APRESOLINE ) 50 MG tablet [id number]  Take 1.5 tablets (75 mg total) by mouth 3 (three) times daily. Vicci Barnie NOVAK, MD  Active   isosorbide  mononitrate (IMDUR ) 30 MG 24 hr tablet [id number]  Take 1 tablet (30 mg total) by mouth daily. Vicci Barnie NOVAK, MD  Active   LANTUS  SOLOSTAR 100 UNIT/ML Solostar Pen [id number]  Inject  40 Units into the skin daily. Vicci Barnie NOVAK, MD  Active   metFORMIN  (GLUCOPHAGE -XR) 500 MG 24 hr tablet [id number]  Take 1 tablet (500 mg total) by mouth daily with breakfast. Vicci Barnie NOVAK, MD  Active   pantoprazole  (PROTONIX ) 40 MG tablet [id number]  Take 1 tablet (40 mg total) by mouth daily. Vicci Barnie NOVAK, MD  Active   rosuvastatin  (CRESTOR ) 40 MG tablet [id number]  Take 1 tablet (40 mg total) by mouth daily. Vicci Barnie NOVAK, MD  Active   spironolactone  (ALDACTONE ) 25 MG tablet [id number]  Take 1 tablet (25 mg total) by mouth daily. Vicci Barnie NOVAK, MD  Active   Study - OCEANIC-STROKE - asundexian 50 mg or placebo tablet (PI-Sethi) [id number]  Take 1 tablet (50 mg total) by mouth daily. For Investigational Only. Take 1 tablet by mouth once daily at the same  time each day, preferably in the morning. Please bring bottle to every visit.  Patient not taking: Reported on 11/11/2023   Rosemarie Eather RAMAN, MD  Active   Vitamin D , Ergocalciferol , (DRISDOL ) 1.25 MG (50000 UNIT) CAPS capsule [id number]  Take 1 capsule (50,000 Units total) by mouth every 7 (seven) days. Vicci Barnie NOVAK, MD  Active             Recommendation:   Continue Current Plan of Care  Follow Up Plan:   Telephone follow-up in 1 month  Rolin Kerns, LCSW [facility]  [facility], [facility] Clinical Social Worker Direct Dial : [phone]  Fax: [phone] Website: [email or web address] 9:32 AM

## 2024-01-15 NOTE — Patient Outreach (Signed)
 Complex Care Management   Visit Note  01/13/2024  Name:  [name] MRN: [id number] DOB: 1965/10/15  Situation: Referral received for Complex Care Management related to Mental/Behavioral Health diagnosis Anxiety I obtained verbal consent from Patient.  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, now resolved   Systolic CHF (HCC)    Echo 05/19/11 EF 35% w/ mild-mod MR    Assessment: Patient Reported Symptoms:  Cognitive Cognitive Status: No symptoms reported, Alert and oriented to person, place, and time, Normal speech and language skills Cognitive/Intellectual Conditions Management [RPT]: None reported or documented in medical history or problem list   Health Maintenance Behaviors: Annual physical exam  Neurological Neurological Review of Symptoms: Not assessed    HEENT HEENT Symptoms Reported: Not assessed      Cardiovascular Cardiovascular Symptoms Reported: Not assessed    Respiratory Respiratory Symptoms Reported: Not assesed    Endocrine Endocrine Symptoms Reported: Not assessed    Gastrointestinal Gastrointestinal Symptoms Reported: Not assessed      Genitourinary Genitourinary Symptoms Reported: Not assessed    Integumentary Integumentary Symptoms Reported: Not assessed    Musculoskeletal Musculoskelatal Symptoms Reviewed:  Other Other Musculoskeletal Symptoms: Neuropathy Musculoskeletal Management Strategies: Coping strategies, Medication therapy   Patient at Risk for Falls Due to: Impaired mobility  Psychosocial Psychosocial Symptoms Reported: Other Other Psychosocial Conditions: Stress Behavioral Management Strategies: Coping strategies, Adequate rest, Support system, Community resources Major Change/Loss/Stressor/Fears (CP): Medical condition, self, School or job Techniques to [employer] with Loss/Stress/Change: Diversional activities, Medication      01/15/2024    PHQ2-9 Depression Screening   Little interest or pleasure in doing things    Feeling down, depressed, or hopeless    PHQ-2 - Total Score    Trouble falling or staying asleep, or sleeping too much    Feeling tired or having little energy    Poor appetite or overeating     Feeling bad about yourself - or that you are a failure or have let yourself or your family down    Trouble concentrating on things, such as reading the newspaper or watching television    Moving or speaking so slowly that other people could have noticed.  Or the opposite - being so fidgety or restless that you have been moving around a lot more than usual    Thoughts that you would be better off dead, or hurting yourself in some way    PHQ2-9 Total Score    If you checked off any problems, how difficult have these problems made it for you to do your work, take care of things at home, or get along with other people    Depression Interventions/Treatment      There were no vitals filed for this visit.    Medications Reviewed Today     Reviewed by Aeva Posey D,  LCSW (Child Psychotherapist) on 01/15/24 at 0911  Med List Status: <None>   Medication Order Taking? Sig Documenting Provider Last Dose Status Informant  albuterol  (VENTOLIN  HFA) 108 (90 Base) MCG/ACT inhaler [id number]  Inhale 2 puffs into the lungs every 6 (six) hours as needed for wheezing or shortness of breath. Vicci Barnie NOVAK, MD  Active Self  aspirin  81 MG chewable tablet [id number]  Chew 1 tablet (81 mg total) by mouth daily. Pearlean Manus, MD  Active Self  busPIRone  (BUSPAR ) 5 MG tablet [id number]  Take 1 tablet (5 mg total) by mouth 2 (two) times daily. Vicci Barnie NOVAK, MD  Active   carvedilol  (COREG ) 25 MG tablet [id number]  Take 1 tablet (25 mg total) by mouth 2 (two) times daily with a meal. Vicci Barnie NOVAK, MD  Active   diazepam  (VALIUM ) 5 MG tablet [id number]  Take one tablet 1 hour prior to MRI Pietro Redell RAMAN, MD  Active   Dulaglutide  (TRULICITY ) 1.5 MG/0.5ML EMMANUEL [id number]  Inject 1.5 mg into the skin once a week. Vicci Barnie NOVAK, MD  Active   DULoxetine  (CYMBALTA ) 20 MG capsule [id number]  Take 1 capsule (20 mg total) by mouth daily. Vicci Barnie NOVAK, MD  Active   empagliflozin  (JARDIANCE ) 10 MG TABS tablet [id number]  Take 1 tablet (10 mg total) by mouth daily before breakfast. Vicci Barnie NOVAK, MD  Active   ezetimibe  (ZETIA ) 10 MG tablet [id number]  Take 1 tablet (10 mg total) by mouth daily. Vicci Barnie NOVAK, MD  Active   furosemide  (LASIX ) 40 MG tablet [id number]  Take 1 tablet (40 mg total) by mouth daily. Vicci Barnie NOVAK, MD  Active   gabapentin  (NEURONTIN ) 100 MG capsule [id number]  Take 2 capsules (200 mg total) by mouth at bedtime. For diabetic neuropathy symptoms Leigh Venetia CROME, MD  Active   hydrALAZINE  (APRESOLINE ) 50 MG tablet [id number]  Take 1.5 tablets (75 mg total) by mouth 3 (three) times daily. Vicci Barnie NOVAK, MD  Active   isosorbide  mononitrate (IMDUR ) 30 MG 24 hr tablet [id number]  Take 1 tablet (30 mg total) by mouth daily. Vicci Barnie NOVAK, MD  Active   LANTUS  SOLOSTAR 100 UNIT/ML Solostar Pen [id number]  Inject 40 Units into the skin daily. Vicci Barnie NOVAK, MD  Active   metFORMIN  (GLUCOPHAGE -XR) 500 MG 24 hr tablet [id number]  Take 1 tablet (500 mg total) by mouth daily with breakfast. Vicci Barnie NOVAK, MD  Active   pantoprazole  (PROTONIX ) 40 MG tablet  [id number]  Take 1 tablet (40 mg total) by mouth daily. Vicci Barnie NOVAK, MD  Active   rosuvastatin  (CRESTOR ) 40 MG tablet [id number]  Take 1 tablet (40 mg total) by mouth daily. Vicci Barnie NOVAK, MD  Active   spironolactone  (ALDACTONE ) 25 MG tablet [id number]  Take 1 tablet (25 mg total) by mouth daily. Vicci Barnie NOVAK, MD  Active   Study - OCEANIC-STROKE - asundexian 50 mg or placebo tablet (PI-Sethi) [id number]  Take 1 tablet (50 mg total) by mouth daily. For Investigational Only. Take 1 tablet by mouth once daily at the same time each day, preferably in the morning. Please bring bottle to every visit.  Patient not taking: Reported on 11/11/2023   Rosemarie Eather RAMAN, MD  Active   Vitamin D , Ergocalciferol , (DRISDOL ) 1.25 MG (50000 UNIT) CAPS capsule [id number]  Take 1 capsule (50,000 Units total) by mouth every 7 (seven) days. Vicci Barnie NOVAK, MD  Active  Recommendation:   Continue Current Plan of Care PCP f/up appt on 01/14/24  Follow Up Plan:   Telephone follow-up in 1 month  Rolin Kerns, LCSW [place]  [facility], [facility] Clinical Social Worker Direct Dial : [phone]  Fax: [phone] Website: [email or web address] 9:19 AM

## 2024-01-16 ENCOUNTER — Ambulatory Visit: Payer: PRIVATE HEALTH INSURANCE | Attending: Internal Medicine

## 2024-01-16 LAB — TB SKIN TEST
Induration: 0 mm
TB Skin Test: NEGATIVE

## 2024-01-16 NOTE — Progress Notes (Deleted)
 NEUROLOGY FOLLOW UP OFFICE NOTE  [name] [id number]  Subjective:  [name] is a 58 y.o. year old  right-handed female with a medical history of HTN, DM2, HLD, stroke, anxiety, depression, vit D deficiency who we last saw on 07/11/23 for headaches, cognitive changes, numbness and tingling in legs, and stroke.  To briefly review: 07/11/23: Patient has multiple complaints today. She is concerned about forgetting things and headaches.   Regarding her headaches, this has been going on for years. It is mostly in the back left part of her head. It is a pressure sensation. She does endorse some pain in the neck as well, maybe more on the right side of the neck pain. She gets the headache about once per week. She isn't sure exactly, but thinks the pain lasts a few hours. She will take tylenol  and this will help. She will feel off balance and staggering and endorses phonophobia. She denies photophobia or nausea or vomiting.   Regarding her memory, she states she forgets simple things like if she takes her medication or dates. This has been present since her first stroke (07/20/2018). Her symptoms at the time of stroke was slurred speech. This improved with no residual symptoms. Regarding sleep, she sleeps about 6 hours a night, but has to get up twice a night to go to the bathroom. She feels well rested when she wakes, but is tired throughout the day. She will nap during the day if able. She thinks she snores. She has never had a sleep study. Regarding her mood, she does see a therapist for anxiety and depression. She has her ups and downs regarding this. Of note, her vitamin D  is low . She takes this once per week.   Patient had another stroke at the end of 11/2022. She was again staggering and her speech was different. Patient does not remember how she got to the hospital, but was admitted from 12/11/22-12/12/22. MRI brain showed left cerebellum stroke and enhancing lesion in left cerebellum,  subacute infarct vs neoplastic lesion. CTA showed left vertebral artery dissection. She was put on plavix  75 mg daily in addition to aspirin  81 mg daily she was already on. The plan was for 3 months of DAPT with follow up CTA then aspirin  monotherapy. There is some confusion about medications, but she thinks she no longer takes plavix  and is only on aspirin . She is on crestor  40 mg and zetia  10 mg daily for HLD.   Of note, patient has numbness and tingling in her legs presumed to be diabetic neuropathy. She has been prescribed gabapentin  in the past, but has never taken it before.   She denies any constitutional symptoms such as fevers, night sweats, or unintended weight loss.   Smoker: never OCP/hormone replacement therapy: no EtOH use: 2 glasses of wine per week Restrictive diet: no Family history of neurologic disease: lot of strokes on mother's side of the family; mother had cancer   For secondary stroke prevention, patient is on aspirin  81 mg daily, Crestor  40 mg daily, and zetia  10 mg daily  Most recent Assessment and Plan (07/11/23): [name] is a 58 y.o. female who presents for evaluation of headaches, cognitive changes, numbness and tingling in legs, and stroke. She has a relevant medical history of HTN, DM2, HLD, stroke, anxiety, depression, vit D deficiency. Her neurological examination is essentially normal today. Available diagnostic data is significant for HbA1c of 6.7, LDL 128 (2024). MRI brain showed an enhancing lesion in left  cerebellum that was felt to be stroke due to CTA showing left vertebral artery dissection, but that also could be neoplastic. Patient was to have repeat imaging, which has not been done yet, so I will do this. The dissection could be causing left neck/head pain and lesion in cerebellum is likely the cause of her dizzy sensations.   Regarding the cognitive changes, she has depression and poor sleep which are the likely drivers of symptoms. Her MRI brain  did not suggest atrophy that would suggest another etiology. I will send her to sleep medicine to evaluate as if she had sleep apnea, treating this could help memory but also reduce risk of heart attack or stroke.   The numbness and tingling in her legs could be due to neuropathy, particularly given her history of diabetes. Her examination does not show clear deficits, so if neuropathy, it is likely mild. She was prescribed gabapentin , but never got the medication.   PLAN: -Blood work: Lipid panel, B1, IFE, TSH -Repeat MRI brain w/wo contrast -Repeat CTA head and neck   For secondary stroke prevention: -Continue aspirin  81 mg daily -Continue Crestor  40 mg and Zetia  10 mg daily   For cognitive changes: -Labs as above -Sleep medicine referral for possible OSA -Continue to see therapist. May discuss mood with PCP if needed as medications could also be considered   For numbness and tingling in legs: -Start gabapentin  200 mg at bedtime as previously prescribed for potential neuropathy   -Continue Vit D supplementation  Since their last visit: MRI brain showed chronic infarcts and the prior enhancement was no longer seen. CTA has not been completed***  Labs showed HbA1c worsening to 9.2 and LDL of 150. Patient is taking Crestor  but not Zetia ***  Patient saw sleep medicine who recommended a home sleep study.***  Headaches? Cognitive changes? Numbness and tingling in legs?   MEDICATIONS:  Outpatient Encounter Medications as of 01/24/2024  Medication Sig   albuterol  (VENTOLIN  HFA) 108 (90 Base) MCG/ACT inhaler Inhale 2 puffs into the lungs every 6 (six) hours as needed for wheezing or shortness of breath.   aspirin  81 MG chewable tablet Chew 1 tablet (81 mg total) by mouth daily.   carvedilol  (COREG ) 25 MG tablet Take 1 tablet (25 mg total) by mouth 2 (two) times daily with a meal.   diazepam  (VALIUM ) 5 MG tablet Take one tablet 1 hour prior to MRI   Dulaglutide  (TRULICITY ) 1.5  MG/0.5ML SOAJ Inject 1.5 mg into the skin once a week.   DULoxetine  (CYMBALTA ) 20 MG capsule Take 1 capsule (20 mg total) by mouth daily.   empagliflozin  (JARDIANCE ) 10 MG TABS tablet Take 1 tablet (10 mg total) by mouth daily before breakfast.   ezetimibe  (ZETIA ) 10 MG tablet Take 1 tablet (10 mg total) by mouth daily.   furosemide  (LASIX ) 40 MG tablet Take 1 tablet (40 mg total) by mouth daily.   gabapentin  (NEURONTIN ) 100 MG capsule Take 2 capsules (200 mg total) by mouth at bedtime. For diabetic neuropathy symptoms   hydrALAZINE  (APRESOLINE ) 50 MG tablet Take 1.5 tablets (75 mg total) by mouth 3 (three) times daily.   isosorbide  mononitrate (IMDUR ) 30 MG 24 hr tablet Take 1 tablet (30 mg total) by mouth daily.   LANTUS  SOLOSTAR 100 UNIT/ML Solostar Pen Inject 40 Units into the skin daily.   metFORMIN  (GLUCOPHAGE -XR) 500 MG 24 hr tablet Take 1 tablet (500 mg total) by mouth daily with breakfast.   pantoprazole  (PROTONIX ) 40 MG tablet Take  1 tablet (40 mg total) by mouth daily.   rosuvastatin  (CRESTOR ) 40 MG tablet Take 1 tablet (40 mg total) by mouth daily.   spironolactone  (ALDACTONE ) 25 MG tablet Take 1 tablet (25 mg total) by mouth daily.   Study - OCEANIC-STROKE - asundexian 50 mg or placebo tablet (PI-Sethi) Take 1 tablet (50 mg total) by mouth daily. For Investigational Only. Take 1 tablet by mouth once daily at the same time each day, preferably in the morning. Please bring bottle to every visit. (Patient not taking: Reported on 11/11/2023)   Vitamin D , Ergocalciferol , (DRISDOL ) 1.25 MG (50000 UNIT) CAPS capsule Take 1 capsule (50,000 Units total) by mouth every 7 (seven) days.   No facility-administered encounter medications on file as of 01/24/2024.    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, now resolved   Systolic CHF (HCC)    Echo 05/19/11 EF 35% w/ mild-mod MR    PAST SURGICAL HISTORY: Past Surgical History:  Procedure Laterality Date   BREAST BIOPSY Left 05/24/2022   MM LT BREAST BX W LOC DEV 1ST LESION IMAGE BX SPEC STEREO GUIDE 05/24/2022 [facility] M[facility]MO[facility]   CERVIX LESION DESTRUCTION     DILATION AND CURETTAGE OF UTERUS     LEFT HEART CATHETERIZATION WITH CORONARY ANGIOGRAM N/A 05/21/2011   Procedure: LEFT HEART CATHETERIZATION WITH CORONARY ANGIOGRAM;  Surgeon: Ozell Fell, MD;  Location: [facility] CATH LAB;  Service: Cardiovascular;  Laterality: N/A;   TUBAL LIGATION  1996    ALLERGIES: Allergies  Allergen Reactions   Lisinopril  Hives and Swelling    Facial    Sertraline  Hives and Swelling    Facial    Tramadol  Hives and Swelling    facial   Ibuprofen Hives and Swelling    FAMILY HISTORY: Family History  Problem Relation Age of Onset   Diabetes Other        multiple   Heart failure Paternal Grandmother    Breast cancer Paternal Grandmother    Heart disease Other        multiple   Breast cancer Mother     SOCIAL HISTORY: Social History   Tobacco Use   Smoking status: Never   Smokeless tobacco: Never  Vaping Use   Vaping status: Never Used  Substance Use Topics   Alcohol use: Yes    Comment: occ wine   Drug use: No   Social History   Social History Narrative   Lives in [place].  Presently unemployed but attends school full time   Are you right handed or left handed? Right    Are you currently employed ?    What is your current  occupation? [employer]   Do you live at home alone?yes   Who lives with you?    What type of home do you live in: 1 story or 2 story? one   Caffiene  1 soda a day       Objective:  Vital Signs:  There were no vitals taken for this visit.  ***  Labs and Imaging review: New results: HbA1c (01/14/24): 9.2  11/11/23: CMP significant for glucose 163 Lipid panel: tChol 227, LDL 150, TG 91  07/11/23: TSH wnl IFE: no M protein B1: wnl  MRI brain w/wo contrast (08/10/23): FINDINGS: No acute intracranial hemorrhage, mass, edema, or hydrocephalus. No areas of abnormal enhancement. Mild cortical atrophy. Small chronic infarct in the left frontal lobe and the posterior left parietal lobe. There is small chronic infarcts in the bilateral cerebellum. No significant white matter disease. The vascular flow voids are unremarkable. No significant sinus disease.   IMPRESSION: No acute intracranial pathology.   Mild cortical atrophy.   Small chronic left MCA territory infarcts and a couple of very small bilateral chronic cerebellar infarcts.  Previously reviewed results: HbA1c (06/06/23): 6.7   03/12/23: B12 466 BMP significant for glucose 163, Cr 1.05   Vit D (12/24/22): 14.0 Lipid panel (12/12/22): tChol 187, LDL 128, TG 119   Imaging/Procedures: CT head wo contrast (12/11/22): IMPRESSION: 1. Possible acute infarct in the medial aspect of the left cerebellum. Recommend brain MRI for further evaluation. 2. Chronic infarcts in the left frontal lobe.   MRI brain w/wo contrast (12/11/22): FINDINGS: Brain: Restricted diffusion with ADC correlate in the inferomedial left cerebellum (series 5, image 62 and series 7, image 51), which is associated with increased T2 hyperintense signal and correlates with the hypodense focus noted on the same-day CT. Additional peripheral focus of restricted diffusion with ADC correlate lateral left cerebellum (series 5, image 65), which is  also associated with increased T2 hyperintense signal but is not particularly hypodense on the same-day CT.   Enhancing 7 mm focus in the left cerebellum (series 16, image 10). No other abnormal parenchymal or meningeal enhancement.   No acute hemorrhage, mass, mass effect, or midline shift. No hydrocephalus or extra-axial collection. Pituitary and craniocervical junction within normal limits. Remote infarcts in the left anterior frontal lobe and left frontoparietal region. Additional remote lacunar infarcts in the bilateral cerebellar hemispheres   Vascular: Normal arterial flow voids.   Skull and upper cervical spine: Normal marrow signal.   Sinuses/Orbits: Clear paranasal sinuses. No acute finding in the orbits.   Other: Fluid in the right mastoid air cells.   IMPRESSION: 1. Acute infarct in the inferomedial left cerebellum, which correlates with the hypodense focus noted on the same-day CT. Additional smaller acute infarct in the lateral left cerebellum. 2. Enhancing focus in the left cerebellum is favored to represent a subacute infarct, although an enhancing neoplastic lesion could appear similar. Attention on follow-up.   CTA head and neck (12/12/22): FINDINGS: CT HEAD FINDINGS   Brain: Recent infarcts in the left cerebellum confirmed by MRI. Small chronic infarcts in the superior left frontal lobe in 2 separate locations. No acute hemorrhage, hydrocephalus, or mass.   Vascular: No hyperdense vessel or unexpected calcification.   Skull: Normal. Negative for fracture or focal lesion.   Sinuses/Orbits: No acute finding.   Review of the MIP images confirms the above findings   CTA NECK FINDINGS   Aortic arch: Atheromatous plaque with 3 vessel branching   Right carotid system: Atheromatous calcification scratch the atheromatous wall thickening with greatest and partially calcified plaque at the bifurcation. No stenosis or ulceration.   Left carotid system:  Low-density atheromatous wall thickening especially of the common carotid. No stenosis or ulceration.  Vertebral arteries: Diffusely attenuated left vertebral artery with thready flow, a change since 2018. This is a presumed dissection. The patient sustained a left carotid dissection previously. No underlying vasculopathy is detected.   Skeleton: No acute finding   Other neck: No acute finding   Upper chest: Clear apical lungs   Review of the MIP images confirms the above findings   CTA HEAD FINDINGS   Anterior circulation: Atheromatous calcification of the cavernous carotids. No branch occlusion, beading, or aneurysm. No proximal flow reducing stenosis.   Posterior circulation: Attenuated left vertebral artery continues to the basilar, a change from prior. Fetal type right PCA flow. No branch occlusion, beading, or aneurysm.   Venous sinuses: Unremarkable   Anatomic variants: None significant   Review of the MIP images confirms the above findings   IMPRESSION: 1. The patient's left cerebellar infarcts are associated with left vertebral dissection with diffusely and highly attenuated left vertebral artery that is new from a 2018 CTA. 2. Atherosclerosis without flow reducing atheromatous stenosis.  Assessment/Plan:  This is [name], a 58 y.o. female with: ***   Plan: ***  Return to clinic in ***  Total time spent reviewing records, interview, history/exam, documentation, and coordination of care on day of encounter:  *** min  Venetia Potters, MD

## 2024-01-16 NOTE — Progress Notes (Signed)
 PPD Reading Note PPD read and results entered in EpicCare. Result: 0 mm induration. Interpretation: Nrgative

## 2024-01-18 ENCOUNTER — Other Ambulatory Visit: Payer: Self-pay | Admitting: Internal Medicine

## 2024-01-18 DIAGNOSIS — I152 Hypertension secondary to endocrine disorders: Secondary | ICD-10-CM

## 2024-01-18 DIAGNOSIS — I5042 Chronic combined systolic (congestive) and diastolic (congestive) heart failure: Secondary | ICD-10-CM

## 2024-01-24 ENCOUNTER — Ambulatory Visit: Payer: PRIVATE HEALTH INSURANCE | Admitting: Neurology

## 2024-01-28 ENCOUNTER — Ambulatory Visit: Payer: Self-pay

## 2024-01-28 NOTE — Telephone Encounter (Signed)
 FYI Only or Action Required?: Action required by provider: clinical question for provider and requesting recommendations from provider.  Patient was last seen in primary care on 01/14/2024 by Vicci Barnie NOVAK, MD.  Called Nurse Triage reporting Leg Pain.  Symptoms began several days ago.  Interventions attempted: Rest, hydration, or home remedies.  Symptoms are: stable.  Triage Disposition: See HCP Within 4 Hours (Or PCP Triage)  Patient/caregiver understands and will follow disposition?: No, wishes to speak with PCP  Copied from CRM #[id number]. Topic: Clinical - Red Word Triage >> Jan 28, 2024  8:27 AM Ivette P wrote: Red Word that prompted transfer to Nurse Triage: last couple of days legs have hurting take 25% dosage of nueropathy. sharper pains in the thigh area and side area. cymbalta . dont know what need to do regarding that. tingling in foot Reason for Disposition  [1] Thigh or calf pain AND [2] only 1 side AND [3] present > 1 hour  (Exception: Chronic unchanged pain.)  Answer Assessment - Initial Assessment Questions Patient reports pain to bilateral thigh pain-endorses pain to right thigh a couple of days ago and then today pain to left thigh area. Patient reports no pain currently as she took her Cymbalta .   1. ONSET: When did the pain start?      Started a couple of days ago 2. LOCATION: Where is the pain located?      Developed sharp pain to right thigh a couple of days ago. Developed pain to left thigh today.  3. PAIN: How bad is the pain?    (Scale 1-10; or mild, moderate, severe)     Currently no pain 4. WORK OR EXERCISE: Has there been any recent work or exercise that involved this part of the body?      no 5. CAUSE: What do you think is causing the leg pain?     unsure 6. OTHER SYMPTOMS: Do you have any other symptoms? (e.g., chest pain, back pain, breathing difficulty, swelling, rash, fever, numbness, weakness)     no  Protocols used: Leg  Pain-A-AH

## 2024-01-29 NOTE — Telephone Encounter (Signed)
 Continue the Cymbalta . Warm compresses. Be seen if any swelling or discoloration to the thigh.

## 2024-01-29 NOTE — Telephone Encounter (Signed)
 Called but no answer. LVM to call back.

## 2024-01-29 NOTE — Telephone Encounter (Signed)
 Patient called back. Verified name & DOB. Informed that per Dr.Johnson to continue the Cymbalta  and do warm compresses. Advised that if there is any swelling or discoloration to the thigh to please call [facility] or go to [facility] / ER to be seen. Patient expressed verbal understanding of all discussed.

## 2024-02-17 ENCOUNTER — Other Ambulatory Visit: Payer: PRIVATE HEALTH INSURANCE | Admitting: Licensed Clinical Social Worker

## 2024-02-17 NOTE — Patient Outreach (Signed)
 Complex Care Management   Visit Note  02/17/2024  Name:  [name] MRN: [id number] DOB: 06-24-1965  Situation: Referral received for Complex Care Management related to Mental/Behavioral Health diagnosis Anxiety and Depression I obtained verbal consent from Patient.  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, now resolved   Systolic CHF (HCC)    Echo 05/19/11 EF 35% w/ mild-mod MR    Assessment: Patient Reported Symptoms:  Cognitive Cognitive Status: No symptoms reported, Alert and oriented to person, place, and time, Normal speech and language skills Cognitive/Intellectual Conditions Management [RPT]: None reported or documented in medical history or problem list   Health Maintenance Behaviors: Annual physical exam  Neurological Neurological Review of Symptoms: No symptoms reported    HEENT HEENT Symptoms Reported: No symptoms reported      Cardiovascular Cardiovascular Symptoms Reported: No symptoms reported    Respiratory Respiratory Symptoms Reported: Not assesed    Endocrine Endocrine Symptoms Reported: No symptoms reported Is patient diabetic?: Yes Is patient checking blood sugars at home?: Yes Endocrine Comment: Pt's A1C increased due to noncompliance with medication regimen. Patient has refilled meds and is focused on decreasing A1C   Gastrointestinal Gastrointestinal Symptoms Reported: No symptoms reported      Genitourinary Genitourinary Symptoms Reported: No symptoms reported    Integumentary Integumentary Symptoms Reported: No symptoms reported    Musculoskeletal Musculoskelatal Symptoms Reviewed: Other Other Musculoskeletal Symptoms: Neuropathy Additional Musculoskeletal Details: Neuropathy continues on feet and has migrated to legs. Has addressed with PCP and has medication to assist Musculoskeletal Management Strategies: Coping strategies, Adequate rest, Medication therapy, Routine screening      Psychosocial Psychosocial Symptoms Reported: No symptoms reported Behavioral Management Strategies: Adequate rest, Coping strategies, Medication therapy, Support system Major Change/Loss/Stressor/Fears (CP): Medical condition, self Techniques to Cope with Loss/Stress/Change: Diversional activities, Spiritual practice(s), Medication      02/17/2024    PHQ2-9 Depression Screening   Little interest or pleasure in doing things    Feeling down, depressed, or hopeless    PHQ-2 - Total Score    Trouble falling or staying asleep, or sleeping too much    Feeling tired or having little energy    Poor appetite or overeating     Feeling bad about yourself - or that you are a failure or have let yourself or your family down    Trouble concentrating on things, such as reading the newspaper or watching television    Moving or speaking so slowly that other people could have noticed.  Or the opposite - being so fidgety or restless that you have been moving around a lot more than usual    Thoughts that you would be better off dead, or hurting yourself in some way    PHQ2-9 Total Score    If you checked off any problems, how difficult have  these problems made it for you to do your work, take care of things at home, or get along with other people    Depression Interventions/Treatment      There were no vitals filed for this  visit.    Medications Reviewed Today     Reviewed by Ezzard Rolin BIRCH, LCSW (Social Worker) on 02/17/24 at 1627  Med List Status: <None>   Medication Order Taking? Sig Documenting Provider Last Dose Status Informant  albuterol  (VENTOLIN  HFA) 108 (90 Base) MCG/ACT inhaler [id number]  Inhale 2 puffs into the lungs every 6 (six) hours as needed for wheezing or shortness of breath. Vicci Barnie NOVAK, MD  Active Self  aspirin  81 MG chewable tablet [id number]  Chew 1 tablet (81 mg total) by mouth daily. Pearlean Manus, MD  Active Self  carvedilol  (COREG ) 25 MG tablet [id number]  TAKE 1 TABLET(25 MG) BY MOUTH TWICE DAILY WITH A MEAL Vicci Barnie NOVAK, MD  Active   diazepam  (VALIUM ) 5 MG tablet [id number]  Take one tablet 1 hour prior to MRI Pietro Redell RAMAN, MD  Active   Dulaglutide  (TRULICITY ) 1.5 MG/0.5ML EMMANUEL [id number]  Inject 1.5 mg into the skin once a week. Vicci Barnie NOVAK, MD  Active   DULoxetine  (CYMBALTA ) 20 MG capsule [id number]  Take 1 capsule (20 mg total) by mouth daily. Vicci Barnie NOVAK, MD  Active   empagliflozin  (JARDIANCE ) 10 MG TABS tablet [id number]  Take 1 tablet (10 mg total) by mouth daily before breakfast. Vicci Barnie NOVAK, MD  Active   ezetimibe  (ZETIA ) 10 MG tablet [id number]  Take 1 tablet (10 mg total) by mouth daily. Vicci Barnie NOVAK, MD  Active   furosemide  (LASIX ) 40 MG tablet [id number]  Take 1 tablet (40 mg total) by mouth daily. Vicci Barnie NOVAK, MD  Active   gabapentin  (NEURONTIN ) 100 MG capsule [id number]  Take 2 capsules (200 mg total) by mouth at bedtime. For diabetic neuropathy symptoms Leigh Venetia CROME, MD  Active   hydrALAZINE  (APRESOLINE ) 50 MG tablet [id number]  Take 1.5 tablets (75 mg total) by mouth 3 (three) times daily. Vicci Barnie NOVAK, MD  Active   isosorbide  mononitrate (IMDUR ) 30 MG 24 hr tablet [id number]  Take 1 tablet (30 mg total) by mouth daily. Vicci Barnie NOVAK, MD  Active   LANTUS  SOLOSTAR 100 UNIT/ML Solostar Pen [id number]  Inject 40 Units  into the skin daily. Vicci Barnie NOVAK, MD  Active   metFORMIN  (GLUCOPHAGE -XR) 500 MG 24 hr tablet [id number]  Take 1 tablet (500 mg total) by mouth daily with breakfast. Vicci Barnie NOVAK, MD  Active   pantoprazole  (PROTONIX ) 40 MG tablet [id number]  Take 1 tablet (40 mg total) by mouth daily. Vicci Barnie NOVAK, MD  Active   rosuvastatin  (CRESTOR ) 40 MG tablet [id number]  Take 1 tablet (40 mg total) by mouth daily. Vicci Barnie NOVAK, MD  Active   spironolactone  (ALDACTONE ) 25 MG tablet [id number]  Take 1 tablet (25 mg total) by mouth daily. Vicci Barnie NOVAK, MD  Active   Study - OCEANIC-STROKE - asundexian 50 mg or placebo tablet (PI-Sethi) [id number]  Take 1 tablet (50 mg total) by mouth daily. For Investigational Only. Take 1 tablet by mouth once daily at the same time each day, preferably in the morning. Please bring bottle to every visit.  Patient not taking: Reported on 11/11/2023   Rosemarie Eather RAMAN, MD  Active   Vitamin D , Ergocalciferol , (DRISDOL ) 1.25 MG (50000 UNIT) CAPS capsule [id number]  Take 1 capsule (50,000  Units total) by mouth every 7 (seven) days. Vicci Barnie NOVAK, MD  Active             Recommendation:   Continue Current Plan of Care  Follow Up Plan:   Telephone follow-up 4-6 weeks  Rolin Kerns, LCSW [facility]  [facility] Clinical Social Worker Direct Dial : [phone]  Fax: [phone] Website: [email or web address] 4:34 PM

## 2024-02-17 NOTE — Patient Instructions (Signed)
 Visit Information  Thank you for taking time to visit with me today. Please don't hesitate to contact me if I can be of assistance to you before our next scheduled appointment.  Your next care management appointment is by telephone on 2/16 at 3:30 PM  Please call the care guide team at [phone] if you need to cancel, schedule, or reschedule an appointment.   Please call the Suicide and Crisis Lifeline: 988 go to [facility] [address] [place] [phone]) call 911 if you are experiencing a Mental Health or Behavioral Health Crisis or need someone to talk to.  Rolin Kerns, LCSW [place]  [facility] Clinical Social Worker Direct Dial : [phone]  Fax: [phone] Website: [email or web address] 4:34 PM

## 2024-03-30 ENCOUNTER — Telehealth: Payer: Self-pay | Admitting: Licensed Clinical Social Worker

## 2024-05-14 ENCOUNTER — Ambulatory Visit: Payer: PRIVATE HEALTH INSURANCE | Admitting: Internal Medicine
